# Patient Record
Sex: Female | Born: 1945
Health system: Southern US, Community
[De-identification: ages and names within clinical notes are randomized; demographics above are authoritative.]

## PROBLEM LIST (undated history)

## (undated) DIAGNOSIS — Z9289 Personal history of other medical treatment: Secondary | ICD-10-CM

## (undated) DIAGNOSIS — J8410 Pulmonary fibrosis, unspecified: Secondary | ICD-10-CM

## (undated) DIAGNOSIS — J849 Interstitial pulmonary disease, unspecified: Secondary | ICD-10-CM

## (undated) DIAGNOSIS — J302 Other seasonal allergic rhinitis: Secondary | ICD-10-CM

## (undated) DIAGNOSIS — I5030 Unspecified diastolic (congestive) heart failure: Secondary | ICD-10-CM

## (undated) DIAGNOSIS — C801 Malignant (primary) neoplasm, unspecified: Secondary | ICD-10-CM

## (undated) DIAGNOSIS — M349 Systemic sclerosis, unspecified: Secondary | ICD-10-CM

## (undated) DIAGNOSIS — M069 Rheumatoid arthritis, unspecified: Secondary | ICD-10-CM

## (undated) DIAGNOSIS — K219 Gastro-esophageal reflux disease without esophagitis: Secondary | ICD-10-CM

## (undated) DIAGNOSIS — K760 Fatty (change of) liver, not elsewhere classified: Secondary | ICD-10-CM

## (undated) DIAGNOSIS — IMO0001 Reserved for inherently not codable concepts without codable children: Secondary | ICD-10-CM

## (undated) DIAGNOSIS — Z9049 Acquired absence of other specified parts of digestive tract: Secondary | ICD-10-CM

## (undated) DIAGNOSIS — I452 Bifascicular block: Secondary | ICD-10-CM

## (undated) DIAGNOSIS — I10 Essential (primary) hypertension: Secondary | ICD-10-CM

## (undated) DIAGNOSIS — I7300 Raynaud's syndrome without gangrene: Secondary | ICD-10-CM

## (undated) DIAGNOSIS — M1990 Unspecified osteoarthritis, unspecified site: Secondary | ICD-10-CM

## (undated) HISTORY — PX: BLADDER SUSPENSION: SHX72

## (undated) HISTORY — DX: Systemic sclerosis, unspecified: M34.9

## (undated) HISTORY — DX: Unspecified osteoarthritis, unspecified site: M19.90

## (undated) HISTORY — DX: Interstitial pulmonary disease, unspecified: J84.9

## (undated) HISTORY — DX: Other seasonal allergic rhinitis: J30.2

## (undated) HISTORY — PX: LAPAROSCOPIC CHOLECYSTECTOMY: SUR755

## (undated) HISTORY — DX: Essential (primary) hypertension: I10

## (undated) HISTORY — DX: Raynaud's syndrome without gangrene: I73.00

## (undated) HISTORY — DX: Pulmonary fibrosis, unspecified: J84.10

## (undated) HISTORY — DX: Acquired absence of other specified parts of digestive tract: Z90.49

## (undated) HISTORY — DX: Unspecified diastolic (congestive) heart failure: I50.30

## (undated) HISTORY — DX: Rheumatoid arthritis, unspecified: M06.9

## (undated) HISTORY — PX: ABDOMINAL HYSTERECTOMY: SHX81

## (undated) HISTORY — PX: TONSILLECTOMY: SUR1361

---

## 1985-11-30 HISTORY — PX: VESICOVAGINAL FISTULA CLOSURE W/ TAH: SUR271

## 2002-10-17 ENCOUNTER — Ambulatory Visit (HOSPITAL_BASED_OUTPATIENT_CLINIC_OR_DEPARTMENT_OTHER): Admission: RE | Admit: 2002-10-17 | Discharge: 2002-10-17 | Payer: Self-pay | Admitting: Orthopedic Surgery

## 2002-12-04 ENCOUNTER — Other Ambulatory Visit: Admission: RE | Admit: 2002-12-04 | Discharge: 2002-12-04 | Payer: Self-pay | Admitting: *Deleted

## 2003-06-11 ENCOUNTER — Other Ambulatory Visit: Admission: RE | Admit: 2003-06-11 | Discharge: 2003-06-11 | Payer: Self-pay | Admitting: *Deleted

## 2003-09-16 ENCOUNTER — Emergency Department (HOSPITAL_COMMUNITY): Admission: EM | Admit: 2003-09-16 | Discharge: 2003-09-16 | Payer: Self-pay | Admitting: Emergency Medicine

## 2004-01-07 ENCOUNTER — Other Ambulatory Visit: Admission: RE | Admit: 2004-01-07 | Discharge: 2004-01-07 | Payer: Self-pay | Admitting: *Deleted

## 2005-05-20 ENCOUNTER — Ambulatory Visit (HOSPITAL_COMMUNITY): Admission: RE | Admit: 2005-05-20 | Discharge: 2005-05-20 | Payer: Self-pay | Admitting: Obstetrics and Gynecology

## 2005-11-30 HISTORY — PX: CARPAL TUNNEL RELEASE: SHX101

## 2006-06-09 ENCOUNTER — Ambulatory Visit (HOSPITAL_COMMUNITY): Admission: RE | Admit: 2006-06-09 | Discharge: 2006-06-09 | Payer: Self-pay | Admitting: Obstetrics and Gynecology

## 2006-06-29 ENCOUNTER — Ambulatory Visit (HOSPITAL_COMMUNITY): Admission: RE | Admit: 2006-06-29 | Discharge: 2006-06-29 | Payer: Self-pay

## 2007-04-22 ENCOUNTER — Ambulatory Visit (HOSPITAL_COMMUNITY): Admission: RE | Admit: 2007-04-22 | Discharge: 2007-04-22 | Payer: Self-pay | Admitting: Internal Medicine

## 2007-09-06 ENCOUNTER — Ambulatory Visit (HOSPITAL_COMMUNITY): Admission: RE | Admit: 2007-09-06 | Discharge: 2007-09-06 | Payer: Self-pay

## 2007-10-31 ENCOUNTER — Ambulatory Visit (HOSPITAL_COMMUNITY): Admission: RE | Admit: 2007-10-31 | Discharge: 2007-10-31 | Payer: Self-pay | Admitting: Obstetrics and Gynecology

## 2008-05-31 ENCOUNTER — Ambulatory Visit (HOSPITAL_COMMUNITY): Admission: RE | Admit: 2008-05-31 | Discharge: 2008-05-31 | Payer: Self-pay | Admitting: Internal Medicine

## 2008-06-19 ENCOUNTER — Ambulatory Visit (HOSPITAL_COMMUNITY): Admission: RE | Admit: 2008-06-19 | Discharge: 2008-06-19 | Payer: Self-pay | Admitting: Gastroenterology

## 2008-07-06 ENCOUNTER — Other Ambulatory Visit: Admission: RE | Admit: 2008-07-06 | Discharge: 2008-07-06 | Payer: Self-pay | Admitting: Obstetrics and Gynecology

## 2008-08-13 ENCOUNTER — Encounter (INDEPENDENT_AMBULATORY_CARE_PROVIDER_SITE_OTHER): Payer: Self-pay | Admitting: General Surgery

## 2008-08-13 ENCOUNTER — Ambulatory Visit (HOSPITAL_COMMUNITY): Admission: RE | Admit: 2008-08-13 | Discharge: 2008-08-13 | Payer: Self-pay | Admitting: General Surgery

## 2008-08-17 ENCOUNTER — Ambulatory Visit (HOSPITAL_COMMUNITY): Admission: RE | Admit: 2008-08-17 | Discharge: 2008-08-17 | Payer: Self-pay | Admitting: General Surgery

## 2008-08-18 ENCOUNTER — Emergency Department (HOSPITAL_COMMUNITY): Admission: EM | Admit: 2008-08-18 | Discharge: 2008-08-18 | Payer: Self-pay | Admitting: Emergency Medicine

## 2009-10-15 ENCOUNTER — Ambulatory Visit (HOSPITAL_COMMUNITY): Admission: RE | Admit: 2009-10-15 | Discharge: 2009-10-15 | Payer: Self-pay | Admitting: Internal Medicine

## 2009-10-23 ENCOUNTER — Ambulatory Visit (HOSPITAL_COMMUNITY): Admission: RE | Admit: 2009-10-23 | Discharge: 2009-10-23 | Payer: Self-pay | Admitting: Orthopaedic Surgery

## 2009-12-17 ENCOUNTER — Ambulatory Visit: Payer: Self-pay | Admitting: Internal Medicine

## 2009-12-18 ENCOUNTER — Encounter: Payer: Self-pay | Admitting: Internal Medicine

## 2010-02-18 ENCOUNTER — Ambulatory Visit: Payer: Self-pay | Admitting: Rheumatology

## 2010-09-19 ENCOUNTER — Other Ambulatory Visit: Payer: Self-pay | Admitting: Obstetrics and Gynecology

## 2010-10-15 ENCOUNTER — Ambulatory Visit: Payer: Self-pay | Admitting: Obstetrics and Gynecology

## 2010-12-30 NOTE — Miscellaneous (Signed)
Summary: Orders Update pft charges  Clinical Lists Changes  Orders: Added new Service order of Carbon Monoxide diffusing w/capacity [phone]) - Signed Added new Service order of Lung Volumes (41991) - Signed Added new Service order of Spirometry (Pre & Post) [phone]) - Signed

## 2011-02-27 ENCOUNTER — Ambulatory Visit: Payer: Self-pay | Admitting: Rheumatology

## 2011-04-14 NOTE — Op Note (Signed)
[name], [name]             ACCOUNT NO.:  [id number]   MEDICAL RECORD NO.:  [id number]          PATIENT TYPE:  AMB   LOCATION:  ENDO                         FACILITY:  [facility]   PHYSICIAN:  Ronald Lobo, M.D.   DATE OF BIRTH:  09-Jan-1946   DATE OF PROCEDURE:  06/19/2008  DATE OF DISCHARGE:                               OPERATIVE REPORT   PROCEDURE:  Upper endoscopy.   INDICATION:  Refractory GERD symptoms.   FINDINGS:  Essentially normal exam.   PROCEDURE:  The nature, purpose and risks of the procedure had been  discussed with the patient who provided written consent.  Sedation was  fentanyl 75 mcg and Versed 7.5 mg IV without arrhythmias or  desaturation.  The Pentax video endoscope was passed under direct  vision.  The esophagus was readily entered.   The esophagus was normal.  No reflux esophagitis or Barrett's esophagus  was seen.  I did not see any free reflux.  No hiatal hernia, ring or  stricture was present.  I did not see varices infection or neoplasia.  It should be noted the vocal cords looked normal during passage of the  scope.   The scope entered the stomach which contained no significant residual.  There was a little bit of patchy erythema in the proximal stomach but no  erosions, ulcers, polyps, masses or antral gastritis.  Retroflexed  viewing of the cardia was unremarkable as was the pylorus, the duodenal  bulb and second duodenum.  The scope was removed from the patient.  No  biopsies were obtained.  She tolerated the procedure well and there were  no apparent complications.   IMPRESSION:  Normal endoscopy, without evidence of adverse sequelae of  reflux disease.   PLAN:  Consider intensification of PPI therapy.           ______________________________  Ronald Lobo, M.D.     RB/MEDQ  D:  06/19/2008  T:  06/19/2008  Job:  [id number]   cc:   Paula Compton. Willey Blade, MD  Fax: [phone]   Michael Litter, M.D.  Fax: [phone]

## 2011-04-14 NOTE — Op Note (Signed)
[name], KLAS             ACCOUNT NO.:  [id number]   MEDICAL RECORD NO.:  [id number]          PATIENT TYPE:  AMB   LOCATION:  ENDO                         FACILITY:  [facility]   PHYSICIAN:  Ronald Lobo, M.D.   DATE OF BIRTH:  September 12, 1946   DATE OF PROCEDURE:  06/19/2008  DATE OF DISCHARGE:                               OPERATIVE REPORT   PROCEDURE:  Colonoscopy.   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   FINDINGS:  Normal exam to the terminal ileum.   DESCRIPTION OF PROCEDURE:  The nature, purpose and risks of the  procedure had been discussed with the patient who provided written  consent.  This procedure was done immediately following her upper  endoscopy.  Total sedation for the two procedures was fentanyl 100 mcg  and Versed 10 mg IV resulting in moderate sedation without arrhythmias  or desaturation.   The Pentax pediatric video colonoscope was advanced to the cecum using a  little bit of external abdominal compression to control looping.  The  terminal ileum was entered for short distance and appeared normal.  Pullback was then performed.  The quality of prep was excellent, and it  was felt that all areas were well seen.   This was a completely normal examination.  No polyps, cancer, colitis,  vascular malformations or diverticular disease were observed; and  retroflexion in the rectum was unremarkable apart from some  hypertrophied anal papillae.   No biopsies were obtained.  The patient tolerated the procedure well,  and there no apparent complications.   IMPRESSION:  Normal screening colonoscopy in a standard risk  individual.   PLAN:  Repeat colonoscopy for screening in 10 years or sooner if needed  because of symptoms or problems.           ______________________________  Ronald Lobo, M.D.     RB/MEDQ  D:  06/19/2008  T:  06/19/2008  Job:  [id number]   cc:   Paula Compton. Willey Blade, MD  Fax:  [phone]   Michael Litter, M.D.  Fax: [phone]

## 2011-04-14 NOTE — Op Note (Signed)
NAMECHELSYE, SUHRE             ACCOUNT NO.:  [id number]   MEDICAL RECORD NO.:  [id number]          PATIENT TYPE:  AMB   LOCATION:  SDS                          FACILITY:  [facility]   PHYSICIAN:  Gwenyth Ober, M.D.    DATE OF BIRTH:  [birth year]   DATE OF PROCEDURE:  08/13/2008  DATE OF DISCHARGE:  08/13/2008                               OPERATIVE REPORT   PREOPERATIVE DIAGNOSIS:  Symptomatic cholelithiasis.   POSTOPERATIVE DIAGNOSIS:  Symptomatic cholelithiasis with a nodule of  fatty liver.   PROCEDURE:  Laparoscopic cholecystectomy.   SURGEON:  Gwenyth Ober, MD   ANESTHESIA:  General endotracheal.   ASSISTANT:  Sharyn Dross, RNFA   ESTIMATED BLOOD LOSS:  Less than 20 mL.   FINDINGS:  A nodule of fatty liver with multiple small gallstones  measuring 1-2 mm in size up to 1 cm in size.   INDICATIONS FOR OPERATION:  The patient is a 65 year old with  symptomatic gallstones who comes in for laparoscopic cholecystectomy.   OPERATION:  The patient was taken to the operating room and placed on  table in supine position.  After an adequate general endotracheal  anesthetic was administered, she was prepped and draped in the usual  sterile manner in exposing midline and right upper quadrant.   A supraumbilical curvilinear incision was made using #11 blade and taken  down to the midline fascia.  We grabbed the fascia with Kocher clamps  and then incised between the Kocher clamps using a #15 blade into the  preperitoneal space.  We grabbed the edges of fascia with Kocher clamps  and dissected through the preperitoneal space into the peritoneal  cavity.  A purse-string suture of 0 Vicryl was passed around the fascial  opening and then a Hasson cannula was passed into the peritoneal cavity  and secured in place with a purse-string suture.  We insufflated carbon  dioxide gas up to a maximal intra-abdominal pressure of 50 mmHg, then  placed the patient in reverse Trendelenburg with  her left side tilted  down.   Two right costal margin or right-sided 5-mm cannulas and a subxiphoid  11/12 mm cannula passed under direct vision.  With all cannulas in  place, we were able to retract the gallbladder towards the right upper  quadrant.  We did take pictures of the nodule of fatty liver, but it did  not appear to be cirrhotic.   We were able to dissect out the peritoneum overlying the triangle of  Calot and hepatoduodenal triangle with a second retractor on the  infundibulum.  We were able to isolate the cystic duct and the cystic  artery very easily.  The cystic duct appeared to be normal size.  We  clipped along the distal side of the cystic duct x3 and proximal on the  gallbladder side and transected the duct.  Some debris of gallstones  came out of the transected portion of the cystic duct, which was small  into the infundibulum.   We clipped the cystic artery proximally and distally and dissected out  the gallbladder from its bed with minimal difficulty,  retrieving it from  this supraumbilical site with a large grasper.  We irrigated the bed for  hemostasis.  There appeared to be adequate hemostasis and cauterization  was necessary but minimal.   We used a purse-string suture of the supraumbilical site nor to close  off the supraumbilical fascia.  We irrigated the bed and aspirated all  fluid and gas from above the liver as we removed all cannulas.   We injected 0.25% Marcaine with epi at all sites.  We then closed the  supraumbilical and subxiphoid site using a running subcuticular stitch  of 4-0 Vicryl.  Again, 0.25% Marcaine was injected at all sites.  The  lateral cannula sites were closed with Dermabond and the other incisions  were closed and covered with Dermabond, Steri-Strips, and Tegaderm.  All  needle counts, sponge counts, and instrument counts were correct.      Gwenyth Ober, M.D.  Electronically Signed     JOW/MEDQ  D:  08/13/2008  T:   08/14/2008  Job:  [id number]   cc:   Paula Compton. Willey Blade, MD

## 2011-04-17 NOTE — Group Therapy Note (Signed)
NAMETRACEY, HERMANCE NO.:  [id number]   MEDICAL RECORD NO.:  [id number]                   PATIENT TYPE:  EMS   LOCATION:  ED                                   FACILITY:  [facility]   PHYSICIAN:  Estill Bamberg. Karie Kirks, M.D.           DATE OF BIRTH:  1946-04-16   DATE OF PROCEDURE:  09/16/2003  DATE OF DISCHARGE:                                   PROGRESS NOTE   SUBJECTIVE:  This is a 65 year old woman who had a flu shot at work at [employer] on Tuesday this week - five days ago.  The following day she  developed fever to 101 and diarrhea.  She had aches.  She has gradually  improved with her fever so she is afebrile now but still having profuse  watery diarrhea.   I talked to her on the phone yesterday and recommended she start on KCl 20  mEq t.i.d.  We also called her in some Lomotil to use one or two q.i.d.  since OTC medications were not working.  She actually feels a little bit  stronger since then but still weak such that when she stands up she has felt  a little bit weak.   She had a hysterectomy but ovaries in place.  She has had no other surgery.   OBJECTIVE:  She is supine in bed, oriented and alert, presently no acute  distress.  Temperature is 97 degrees, pulse 100, respiratory rate 16, blood  pressure 143/71, O2 saturation on room air 97%.  Her vital signs sitting  were BP 143/71, pulse 100; and then standing the blood pressure dropped to  124/79, pulse was 115.  Heart had a regular rhythm without murmur, rate of  70.  The lungs appear clear throughout.  The abdomen was soft without  demonstrable hepatosplenomegaly or mass but she did have tenderness in both  the right and left upper quadrant on deep palpation.  There is no edema of  the ankles.  Skin turgor was normal.   Her white cell count was 8000 of which 77% were neutrophils, 17%  lymphocytes.  H&H was 14.4/42.9, MCV 89.  Her MET-7 showed a potassium of  3.6, BUN 11,  creatinine 0.7.   ASSESSMENT:  1. Dehydration.  2. Probably viral enteritis.   PLAN:  She is on IV normal saline at 200 mL/hour presently.  LFTs are  pending.  If these are positive will admit and get abdominal ultrasound.  If  they are negative, arrange for her to be discharged on Levaquin 500 mg daily  and metronidazole 250 mg t.i.d. 10-day course after a stool culture is done.  Discussed at length with the patient and her husband.  She was seen at the  [facility] emergency room by me on a Sunday morning and I spent 25 minutes  reviewing her medical record including the paper record, discussion with  her,  exam, formulation of plan, etc.      ___________________________________________                                            Estill Bamberg. Karie Kirks, M.D.   SDK/MEDQ  D:  09/16/2003  T:  09/17/2003  Job:  [id number]

## 2011-04-17 NOTE — Op Note (Signed)
[name], SNARE                         ACCOUNT NO.:  [id number]   MEDICAL RECORD NO.:  [id number]                   PATIENT TYPE:  AMB   LOCATION:  [place]                                  FACILITY:  [facility]   PHYSICIAN:  Youlanda Mighty. Luisa Dago., M.D.          DATE OF BIRTH:  [birth year]   DATE OF PROCEDURE:  10/17/2002  DATE OF DISCHARGE:                                 OPERATIVE REPORT   PREOPERATIVE DIAGNOSIS:  Bilateral carpal tunnel syndrome.   POSTOPERATIVE DIAGNOSIS:  Bilateral carpal tunnel syndrome.   OPERATION PERFORMED:  1. Release of right transverse carpal ligament 64721-RT.  2. Injection of left ulnar bursa 20526-LT.   SURGEON:  Youlanda Mighty. Sypher, M.D.   ASSISTANT:  Julian Reil, P.A.   ANESTHESIA:  General by LMA.   SUPERVISING ANESTHESIOLOGIST:  Jessy Oto. Albertina Parr, M.D.   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  She had a lengthy history of  bilateral carpal tunnel syndrome dating back to before 2000.  She had had  previous electrodiagnostic studies completed by her neurologist in the year  2000 which documented carpal tunnel syndrome.  She has been treating her  carpal tunnel syndrome nonoperatively with splinting and activity  modification.   She has had persistent symptoms.  Therefore, repeat electrodiagnostic  studies were completed at our office by Dr. Zebedee Iba which documented  significant bilateral carpal tunnel syndrome with prolongation of the motor  and sensory latencies bilaterally as well as prolongation of the lumbrical  interosseous difference.   Due to failure to respond to nonoperative measures, she is brought to the  operating room at this time for release of her right transverse carpal  ligament and requested injection of her left ulnar bursa under anesthesia  with steroid and  lidocaine.  After informed consent she is brought to the  operating room at this time.   DESCRIPTION OF PROCEDURE:  The patient was brought to the operating room and  placed in supine position upon the operating table.  Following induction of  general anesthesia by LMA, the right arm was prepped with Betadine soap and  solution and sterilely draped.  Following exsanguination of the limb with an  Esmarch bandage, an arterial tourniquet applied to 240 mmHg and later  elevated to 260 mmHg due to mild systolic hypertension.   The procedure commenced with a short incision in line with the ring finger  in the palm.  The subcutaneous tissues were carefully divided in the palmar  fascia.  The patient was noted to have a rather prominent thenar muscle  extending to the distal margin of the transverse carpal ligament essentially  bridging to the hypothenar muscles.  This was carefully teased with scissors  to be certain the motor branch was not imperiled by release at this level.  The transverse carpal ligament distal margin was identified and subsequently  released with scissors into the distal forearm.  This widened opened the  carpal canal.  No masses or other predicaments were noted.  Bleeding points  along the margin of the released ligament were electrocauterized with  bipolar current followed by repair of the skin with intradermal 3-0 Prolene  suture.  The wound was then dressed with Xeroflo, sterile gauze and a volar  plaster splint maintaining the wrist in 5 degrees dorsiflexion.  An Ace wrap  was used to provide light compression.   Attention was then directed to the left wrist.  The skin was prepped with  alcohol followed by injection of a mixture of 0.5 cc of Depo-Medrol 40 mg  per mg and 1.5 cc of 1% lidocaine without epinephrine.  This was injected  into the ulnar bursa at the level of the proximal wrist flexion crease in  the line of the ring finger.  This wound was held with  compression for one  minute following injection.  There were no apparent complications.   The patient tolerated surgery and anesthesia well.  She was transferred to  the recovery room with stable vital signs.  For aftercare she was given a  prescription for Percocet 5 mg one or two tablets p.o. q.4-6h. p.r.n. pain.  Twenty tablets without refill.  She will return to our office in follow-up  in seven to 10 days for dressing change and advancement to an exercise  program.                                               Youlanda Mighty. Luisa Dago., M.D.    RVS/MEDQ  D:  10/17/2002  T:  10/17/2002  Job:  [id number]

## 2011-05-19 ENCOUNTER — Ambulatory Visit: Payer: Self-pay | Admitting: Podiatry

## 2011-08-31 LAB — COMPREHENSIVE METABOLIC PANEL
Alkaline Phosphatase: 75
BUN: 15
CO2: 24
Calcium: 9.6
Chloride: 105
Creatinine, Ser: 0.71
GFR calc non Af Amer: >60
Glucose, Bld: 106 — ABNORMAL HIGH
Potassium: 4.4
Sodium: 140
Total Bilirubin: 1.2

## 2011-08-31 LAB — DIFFERENTIAL
Basophils Relative: 1
Eosinophils Absolute: 0.2
Monocytes Relative: 7
Neutrophils Relative %: 69

## 2011-08-31 LAB — POCT I-STAT 3, ART BLOOD GAS (G3+)
Acid-Base Excess: 4 — ABNORMAL HIGH
Bicarbonate: 28.5 — ABNORMAL HIGH
O2 Saturation: 97
TCO2: 30
pCO2 arterial: 41.4
pH, Arterial: 7.446 — ABNORMAL HIGH
pO2, Arterial: 89

## 2011-08-31 LAB — LIPASE, BLOOD: Lipase: 31

## 2011-08-31 LAB — CBC
MCHC: 34.7
RDW: 12.7

## 2011-09-02 LAB — COMPREHENSIVE METABOLIC PANEL
AST: 39 — ABNORMAL HIGH
Alkaline Phosphatase: 75
BUN: 15
CO2: 27
Creatinine, Ser: 0.84
GFR calc Af Amer: >60
Glucose, Bld: 99
Potassium: 4
Total Bilirubin: 0.9
Total Protein: 7.2

## 2011-09-02 LAB — DIFFERENTIAL
Basophils Absolute: 0.1
Basophils Relative: 1

## 2011-09-02 LAB — CBC
HCT: 40.8
MCV: 91.9
RDW: 12.7
WBC: 8.4

## 2011-09-30 DIAGNOSIS — M170 Bilateral primary osteoarthritis of knee: Secondary | ICD-10-CM | POA: Insufficient documentation

## 2011-09-30 DIAGNOSIS — M1710 Unilateral primary osteoarthritis, unspecified knee: Secondary | ICD-10-CM | POA: Insufficient documentation

## 2011-09-30 DIAGNOSIS — M3481 Systemic sclerosis with lung involvement: Secondary | ICD-10-CM | POA: Insufficient documentation

## 2011-09-30 LAB — PULMONARY FUNCTION TEST

## 2012-05-13 ENCOUNTER — Ambulatory Visit (HOSPITAL_COMMUNITY)
Admission: RE | Admit: 2012-05-13 | Discharge: 2012-05-13 | Disposition: A | Discharge status: Home / Self Care | Payer: PRIVATE HEALTH INSURANCE | Source: Ambulatory Visit | Attending: Internal Medicine | Admitting: Internal Medicine

## 2012-05-13 ENCOUNTER — Other Ambulatory Visit (HOSPITAL_COMMUNITY): Payer: Self-pay | Admitting: Internal Medicine

## 2012-05-13 DIAGNOSIS — J479 Bronchiectasis, uncomplicated: Secondary | ICD-10-CM | POA: Insufficient documentation

## 2012-05-13 DIAGNOSIS — R05 Cough: Secondary | ICD-10-CM

## 2012-05-13 DIAGNOSIS — R059 Cough, unspecified: Secondary | ICD-10-CM | POA: Insufficient documentation

## 2012-05-13 DIAGNOSIS — R0602 Shortness of breath: Secondary | ICD-10-CM | POA: Insufficient documentation

## 2012-05-13 DIAGNOSIS — J8410 Pulmonary fibrosis, unspecified: Secondary | ICD-10-CM | POA: Insufficient documentation

## 2012-06-27 ENCOUNTER — Encounter: Payer: Self-pay | Admitting: Pulmonary Disease

## 2012-06-28 ENCOUNTER — Ambulatory Visit (INDEPENDENT_AMBULATORY_CARE_PROVIDER_SITE_OTHER): Payer: PRIVATE HEALTH INSURANCE | Admitting: Critical Care Medicine

## 2012-06-28 ENCOUNTER — Encounter: Payer: Self-pay | Admitting: Critical Care Medicine

## 2012-06-28 VITALS — BP 126/72 | HR 86 | Temp 98.2°F | Ht 63.0 in | Wt 199.8 lb

## 2012-06-28 DIAGNOSIS — J849 Interstitial pulmonary disease, unspecified: Secondary | ICD-10-CM

## 2012-06-28 DIAGNOSIS — M349 Systemic sclerosis, unspecified: Secondary | ICD-10-CM

## 2012-06-28 DIAGNOSIS — J302 Other seasonal allergic rhinitis: Secondary | ICD-10-CM

## 2012-06-28 DIAGNOSIS — M069 Rheumatoid arthritis, unspecified: Secondary | ICD-10-CM

## 2012-06-28 DIAGNOSIS — M1990 Unspecified osteoarthritis, unspecified site: Secondary | ICD-10-CM

## 2012-06-28 DIAGNOSIS — J309 Allergic rhinitis, unspecified: Secondary | ICD-10-CM

## 2012-06-28 DIAGNOSIS — I7300 Raynaud's syndrome without gangrene: Secondary | ICD-10-CM

## 2012-06-28 DIAGNOSIS — I10 Essential (primary) hypertension: Secondary | ICD-10-CM

## 2012-06-28 DIAGNOSIS — J8410 Pulmonary fibrosis, unspecified: Secondary | ICD-10-CM

## 2012-06-28 NOTE — Patient Instructions (Signed)
No change in medications. A CT of the chest will be obtained at [facility] An Echocardiogram will be obtained at [facility] Full PFTs and 61mn walk will be obtained at our office I will call with results [place]

## 2012-06-28 NOTE — Progress Notes (Signed)
Subjective:    Patient ID: [name], female    DOB: 27-Jan-1946, 66 y.o.   MRN: [id number]  Shortness of Breath This is a chronic problem. The current episode started more than 1 year ago. The problem occurs daily. The problem has been gradually worsening (exertion only.  Ok at rest.  no qhs issues). Associated symptoms include abdominal pain, leg swelling, rhinorrhea, a sore throat and sputum production. Pertinent negatives include no chest pain, claudication, coryza, ear pain, fever, headaches, leg pain, neck pain, orthopnea, PND, rash, swollen glands, vomiting or wheezing. The symptoms are aggravated by any activity, exercise and weather changes (worse in winter). Associated symptoms comments: Raynauds.  Sclerodactyly  No esophagus issues.  No dysphagia issues. Hx of chronic cough.  Occ productive but swallows  No PulmHTN. Several echo 1 year No edema in feet No PNA Hands, wrist ,knees. Dx Rheumatoid Arthritis. Risk factors include smoking. She has tried oral steroids (pred helped, MTX weekly inj helps) for the symptoms. The treatment provided moderate relief. Her past medical history is significant for chronic lung disease. There is no history of allergies, aspirin allergies, asthma, bronchiolitis, CAD, COPD, DVT, a heart failure, PE, pneumonia or a recent surgery.   66 y.o. F Dx Scleroderma Dx 28yr ago Dr RJustine Null  [facility]  Saw MRandol Kernas well 10/12.   Pt has had several CTs and PFTs.      Past Medical History  Diagnosis Date  . Systemic sclerosis   . Osteoarthritis   . Seasonal allergies   . Interstitial lung disease   . H/O: hysterectomy   . H/O bladder repair surgery   . Hx of cholecystectomy   . Rheumatoid arthritis   . Raynaud disease   . Hypertension   . Pulmonary fibrosis      Family History  Problem Relation Age of Onset  . Stroke Mother   . Heart attack Father   . Dementia Brother   . Osteoarthritis    . Celiac disease    . Emphysema  Father   . Heart disease Mother      History   Social History  . Marital Status: Married    Spouse Name: N/A    Number of Children: 3  . Years of Education: N/A   Occupational History  . Nurse   .     Social History Main Topics  . Smoking status: Former Smoker -- 1.0 packs/day for 20 years    Types: Cigarettes    Quit date: 11/30/1988  . Smokeless tobacco: Never Used  . Alcohol Use: No  . Drug Use: No  . Sexually Active: Not on file   Other Topics Concern  . Not on file   Social History Narrative  . No narrative on file     Allergies  Allergen Reactions  . Codeine     Neurological problems - hallucinations, "spacey"     Outpatient Prescriptions Prior to Visit  Medication Sig Dispense Refill  . aspirin 81 MG tablet Take 81 mg by mouth daily.      .Marland Kitchenazelastine (ASTELIN) 137 MCG/SPRAY nasal spray Place 2 sprays into the nose 2 (two) times daily as needed. Use in each nostril as directed       . Biotin 5 MG CAPS Take 1 capsule by mouth daily.      . clopidogrel (PLAVIX) 300 MG TABS Take 200 mg by mouth once.      .Marland Kitchenestradiol (ESTRACE) 0.5 MG tablet  Take 0.5 mg by mouth daily.      . fexofenadine-pseudoephedrine (ALLEGRA-D) 60-120 MG per tablet Take 1 tablet by mouth as needed.      . zolpidem (AMBIEN) 5 MG tablet Take 12.5 mg by mouth as needed.       Marland Kitchen NIFEdipine (PROCARDIA-XL/ADALAT CC) 30 MG 24 hr tablet Take 30 mg by mouth daily.      Marland Kitchen estrogens, conjugated, (PREMARIN) 0.3 MG tablet Take 0.3 mg by mouth daily. Take daily for 21 days then do not take for 7 days.       . Flaxseed, Linseed, (FLAXSEED OIL PO) Take by mouth daily.      Marland Kitchen leflunomide (ARAVA) 20 MG tablet Take 20 mg by mouth daily.      . Multiple Vitamin (MULTIVITAMIN) tablet Take 1 tablet by mouth daily.          Review of Systems  Constitutional: Positive for chills, diaphoresis, fatigue and unexpected weight change. Negative for fever, activity change and appetite change.  HENT: Positive for  congestion, sore throat, rhinorrhea, sneezing and postnasal drip. Negative for hearing loss, ear pain, nosebleeds, facial swelling, mouth sores, trouble swallowing, neck pain, neck stiffness, dental problem, voice change, sinus pressure, tinnitus and ear discharge.   Eyes: Negative for photophobia, discharge, itching and visual disturbance.  Respiratory: Positive for cough, sputum production and shortness of breath. Negative for apnea, choking, chest tightness, wheezing and stridor.   Cardiovascular: Positive for palpitations and leg swelling. Negative for chest pain, orthopnea, claudication and PND.  Gastrointestinal: Positive for abdominal pain and abdominal distention. Negative for nausea, vomiting, constipation and blood in stool.  Genitourinary: Positive for urgency. Negative for dysuria, frequency, hematuria, flank pain, decreased urine volume and difficulty urinating.  Musculoskeletal: Positive for myalgias, back pain, joint swelling and arthralgias. Negative for gait problem.  Skin: Negative for color change, pallor and rash.  Neurological: Negative for dizziness, tremors, seizures, syncope, speech difficulty, weakness, light-headedness, numbness and headaches.  Hematological: Negative for adenopathy. Bruises/bleeds easily.  Psychiatric/Behavioral: Negative for confusion, disturbed wake/sleep cycle and agitation. The patient is not nervous/anxious.        Objective:   Physical Exam Filed Vitals:   06/28/12 1616  BP: 126/72  Pulse: 86  Temp: 98.2 F (36.8 C)  TempSrc: Oral  Height: _0  (1.6 m)  Weight: 90.629 kg (199 lb 12.8 oz)  SpO2: 97%    Gen: Pleasant, obese , in no distress,  normal affect  Classic perioral sclerodermal facies  ENT: No lesions,  mouth clear,  oropharynx clear, no postnasal drip  Neck: No JVD, no TMG, no carotid bruits  Lungs: No use of accessory muscles, no dullness to percussion,dry rales at bases  Cardiovascular: RRR, heart sounds normal, no  murmur or gallops, no peripheral edema  Abdomen: soft and NT, no HSM,  BS normal  Musculoskeletal: No deformities, no cyanosis or clubbing.  Mild sclerodactyly  Neuro: alert, non focal  Skin: Warm, telangiectasias  10/12:  Findings c/w pulm fibrosis.  UIP pattern.  Periphery distribution.  Scattered GG opacities Mediastinal LAN stable.  Fatty liver  DLCO 50% 10/12    Echo 2011  No pulm HTN  Nl LVEF       Assessment & Plan:   Pulmonary fibrosis Pulm fibrosis presumably d/t Scleroderma No esophageal involvement. + raynaud, sclerodactyly, telangiectasias,  pulm fibrosis.  No evident Pulm HTN at last check in 2010.  Last CT chest and PFT 10/12 at [facility]  DLCO 50% 10/12 Rheum: Dr Alanda Amass  at [facility] Currently pt at [place].  No overt oxygen needs Plan Need old records from [place] updated echo, CT Chest HRCT, Full PFTs with 6MW, ONO on RA  Return with updated infor to regroup No change in meds for now   Updated Medication List Outpatient Encounter Prescriptions as of 06/28/2012  Medication Sig Dispense Refill  . albuterol (PROVENTIL HFA;VENTOLIN HFA) 108 (90 BASE) MCG/ACT inhaler Inhale 2 puffs into the lungs every 6 (six) hours as needed.      Marland Kitchen aspirin 81 MG tablet Take 81 mg by mouth daily.      Marland Kitchen azelastine (ASTELIN) 137 MCG/SPRAY nasal spray Place 2 sprays into the nose 2 (two) times daily as needed. Use in each nostril as directed       . Biotin 5 MG CAPS Take 1 capsule by mouth daily.      . clopidogrel (PLAVIX) 300 MG TABS Take 200 mg by mouth once.      Marland Kitchen estradiol (ESTRACE) 0.5 MG tablet Take 0.5 mg by mouth daily.      . fexofenadine-pseudoephedrine (ALLEGRA-D) 60-120 MG per tablet Take 1 tablet by mouth as needed.      Marland Kitchen FOLIC ACID PO Take 1 tablet by mouth daily.      . hydroxychloroquine (PLAQUENIL) 200 MG tablet Take 400 mg by mouth daily.      . methotrexate 25 MG/ML SOLN once. 0.6 once weekly      . pantoprazole (PROTONIX) 20 MG tablet Take 20 mg by  mouth 2 (two) times daily.      . predniSONE (DELTASONE) 10 MG tablet Take 15 mg by mouth daily. As directed      . zolpidem (AMBIEN) 5 MG tablet Take 12.5 mg by mouth as needed.       Marland Kitchen DISCONTD: NIFEdipine (PROCARDIA-XL/ADALAT CC) 30 MG 24 hr tablet Take 30 mg by mouth daily.      Marland Kitchen DISCONTD: estrogens, conjugated, (PREMARIN) 0.3 MG tablet Take 0.3 mg by mouth daily. Take daily for 21 days then do not take for 7 days.       Marland Kitchen DISCONTD: Flaxseed, Linseed, (FLAXSEED OIL PO) Take by mouth daily.      Marland Kitchen DISCONTD: leflunomide (ARAVA) 20 MG tablet Take 20 mg by mouth daily.      Marland Kitchen DISCONTD: Multiple Vitamin (MULTIVITAMIN) tablet Take 1 tablet by mouth daily.

## 2012-06-29 NOTE — Assessment & Plan Note (Signed)
Pulm fibrosis presumably d/t Scleroderma No esophageal involvement. + raynaud, sclerodactyly, telangiectasias,  pulm fibrosis.  No evident Pulm HTN at last check in 2010.  Last CT chest and PFT 10/12 at [facility] 50% 10/12 Rheum: Dr Alanda Amass at [facility] Currently pt at [facility].  No overt oxygen needs Plan Need old records from [place] updated echo, CT Chest HRCT, Full PFTs with 6MW, ONO on RA  Return with updated infor to regroup No change in meds for now

## 2012-07-04 ENCOUNTER — Ambulatory Visit (HOSPITAL_COMMUNITY)
Admission: RE | Admit: 2012-07-04 | Discharge: 2012-07-04 | Disposition: A | Discharge status: Home / Self Care | Payer: PRIVATE HEALTH INSURANCE | Source: Ambulatory Visit | Attending: Critical Care Medicine | Admitting: Critical Care Medicine

## 2012-07-04 DIAGNOSIS — I517 Cardiomegaly: Secondary | ICD-10-CM

## 2012-07-04 DIAGNOSIS — J8410 Pulmonary fibrosis, unspecified: Secondary | ICD-10-CM | POA: Insufficient documentation

## 2012-07-04 DIAGNOSIS — M349 Systemic sclerosis, unspecified: Secondary | ICD-10-CM

## 2012-07-04 DIAGNOSIS — I270 Primary pulmonary hypertension: Secondary | ICD-10-CM | POA: Insufficient documentation

## 2012-07-04 NOTE — Progress Notes (Signed)
*  PRELIMINARY RESULTS* Echocardiogram 2D Echocardiogram has been performed.  [name] 07/04/2012, 11:40 AM

## 2012-07-05 ENCOUNTER — Ambulatory Visit (INDEPENDENT_AMBULATORY_CARE_PROVIDER_SITE_OTHER): Payer: PRIVATE HEALTH INSURANCE | Admitting: Internal Medicine

## 2012-07-05 DIAGNOSIS — R0609 Other forms of dyspnea: Secondary | ICD-10-CM

## 2012-07-05 DIAGNOSIS — J8410 Pulmonary fibrosis, unspecified: Secondary | ICD-10-CM

## 2012-07-05 DIAGNOSIS — J849 Interstitial pulmonary disease, unspecified: Secondary | ICD-10-CM

## 2012-07-05 DIAGNOSIS — M349 Systemic sclerosis, unspecified: Secondary | ICD-10-CM

## 2012-07-05 DIAGNOSIS — R0600 Dyspnea, unspecified: Secondary | ICD-10-CM

## 2012-07-05 LAB — PULMONARY FUNCTION TEST

## 2012-07-05 NOTE — Progress Notes (Signed)
PFT done today.

## 2012-07-06 ENCOUNTER — Telehealth: Payer: Self-pay | Admitting: Internal Medicine

## 2012-07-06 NOTE — Telephone Encounter (Signed)
[facility] for review on 07-06-12 ym

## 2012-07-07 ENCOUNTER — Encounter: Payer: Self-pay | Admitting: Critical Care Medicine

## 2012-07-07 ENCOUNTER — Telehealth: Payer: Self-pay | Admitting: Critical Care Medicine

## 2012-07-07 NOTE — Telephone Encounter (Signed)
I atc pt's home # x [phone] - [phone] - received fast busy signal.  wcb

## 2012-07-07 NOTE — Telephone Encounter (Signed)
Call pt and tell her pfts stable from [place] I have a call in to her Rheum MD

## 2012-07-08 NOTE — Telephone Encounter (Signed)
Called, spoke with pt.  I informed her pfts are stable from [place] and advised Dr. Joya Gaskins is waiting for Rheum MD to return his call.  She verbalized understanding of this.

## 2012-07-10 NOTE — Progress Notes (Signed)
Documentation for 6 minute walk test 

## 2012-07-11 ENCOUNTER — Telehealth: Payer: Self-pay | Admitting: Critical Care Medicine

## 2012-07-11 ENCOUNTER — Telehealth: Payer: Self-pay | Admitting: *Deleted

## 2012-07-11 NOTE — Telephone Encounter (Signed)
I spoke to the patient and told her about the changes in medications we would be making in conjuntion with Rheum MD at [facility] verbalized understanding that the methotrexate would be changed to a new med

## 2012-07-11 NOTE — Telephone Encounter (Signed)
Per Dr. Joya Gaskins, ONO on RA WNL.  Please call pt to inform her of this.  ---  Called, spoke with pt.  I informed her ONO on RA WNL per Joya Gaskins.  She verbalized understanding of this and voiced no further questions/concerns at this time.

## 2012-07-19 ENCOUNTER — Encounter: Payer: Self-pay | Admitting: Critical Care Medicine

## 2012-07-19 NOTE — Progress Notes (Signed)
Quick Note:  Dr. Joya Gaskins has spoke with Dr. Alanda Amass and pt. ______

## 2012-09-01 ENCOUNTER — Telehealth: Payer: Self-pay | Admitting: Critical Care Medicine

## 2012-09-01 NOTE — Telephone Encounter (Signed)
Called pt and left message x3 to make a follow up apt. No return call back. Sent letter 09/01/12 for pt to call and schd follow up.

## 2012-09-13 ENCOUNTER — Encounter: Payer: Self-pay | Admitting: Critical Care Medicine

## 2012-09-13 ENCOUNTER — Ambulatory Visit (INDEPENDENT_AMBULATORY_CARE_PROVIDER_SITE_OTHER): Payer: PRIVATE HEALTH INSURANCE | Admitting: Critical Care Medicine

## 2012-09-13 VITALS — BP 130/70 | HR 79 | Temp 98.2°F | Ht 63.0 in | Wt 209.2 lb

## 2012-09-13 DIAGNOSIS — J8410 Pulmonary fibrosis, unspecified: Secondary | ICD-10-CM

## 2012-09-13 NOTE — Progress Notes (Signed)
Subjective:    Patient ID: [name], female    DOB: [birth year], 66 y.o.   MRN: [id number]  HPI 66 y.o. F Dx Scleroderma Dx 72yr ago Dr RJustine Null  [facility]  Saw MRandol Kernas well 10/12.   Pt has had several CTs and PFTs.    09/13/2012  Here to regroup with scleroderma issues. The patient is now [facility] The patient notes joint symptoms are better on Humira. The patient denies any change in level of dyspnea. We reviewed with the patient all of the recent testing performed. There is no evidence of pulmonary hypertension. Pulmonary physiology and chest CT scan did not show significant fibrotic change or progression. Methotrexate was considered a risk for this patient due to pulmonary fibrosis and now has been discontinued. There are no new respiratory complaints.  Past Medical History  Diagnosis Date  . Systemic sclerosis   . Osteoarthritis   . Seasonal allergies   . Interstitial lung disease   . H/O: hysterectomy   . H/O bladder repair surgery   . Hx of cholecystectomy   . Rheumatoid arthritis   . Raynaud disease   . Hypertension   . Pulmonary fibrosis      Family History  Problem Relation Age of Onset  . Stroke Mother   . Heart attack Father   . Dementia Brother   . Osteoarthritis    . Celiac disease    . Emphysema Father   . Heart disease Mother      History   Social History  . Marital Status: Married    Spouse Name: N/A    Number of Children: 3  . Years of Education: N/A   Occupational History  . Nurse   .     Social History Main Topics  . Smoking status: Former Smoker -- 1.0 packs/day for 20 years    Types: Cigarettes    Quit date: 11/30/1988  . Smokeless tobacco: Never Used  . Alcohol Use: No  . Drug Use: No  . Sexually Active: Not on file   Other Topics Concern  . Not on file   Social History Narrative  . No narrative on file     Allergies  Allergen Reactions  . Codeine     Neurological problems -  hallucinations, "spacey"     Outpatient Prescriptions Prior to Visit  Medication Sig Dispense Refill  . albuterol (PROVENTIL HFA;VENTOLIN HFA) 108 (90 BASE) MCG/ACT inhaler Inhale 2 puffs into the lungs every 6 (six) hours as needed.      .Marland Kitchenaspirin 81 MG tablet Take 81 mg by mouth daily.      .Marland Kitchenazelastine (ASTELIN) 137 MCG/SPRAY nasal spray Place 2 sprays into the nose 2 (two) times daily as needed. Use in each nostril as directed       . Biotin 5 MG CAPS Take 1 capsule by mouth daily.      . clopidogrel (PLAVIX) 300 MG TABS Take 300 mg by mouth daily.       .Marland Kitchenestradiol (ESTRACE) 0.5 MG tablet Take 0.5 mg by mouth daily.      . fexofenadine-pseudoephedrine (ALLEGRA-D) 60-120 MG per tablet Take 1 tablet by mouth as needed.      .Marland KitchenFOLIC ACID PO Take 1 tablet by mouth daily.      . hydroxychloroquine (PLAQUENIL) 200 MG tablet Take 400 mg by mouth daily.      . pantoprazole (PROTONIX) 20 MG tablet  Take 20 mg by mouth 2 (two) times daily.      . predniSONE (DELTASONE) 10 MG tablet Take 5 mg by mouth daily. As directed      . zolpidem (AMBIEN) 5 MG tablet Take 12.5 mg by mouth as needed.       . methotrexate 25 MG/ML SOLN once. 0.6 once weekly          Review of Systems  Constitutional: Positive for fatigue and unexpected weight change. Negative for chills, diaphoresis, activity change and appetite change.  HENT: Positive for postnasal drip. Negative for hearing loss, nosebleeds, congestion, facial swelling, sneezing, mouth sores, trouble swallowing, neck stiffness, dental problem, voice change, sinus pressure, tinnitus and ear discharge.   Eyes: Negative for photophobia, discharge, itching and visual disturbance.  Respiratory: Negative for apnea, cough, choking, chest tightness and stridor.   Cardiovascular: Positive for palpitations.  Gastrointestinal: Positive for abdominal distention. Negative for nausea, constipation and blood in stool.  Genitourinary: Positive for urgency. Negative for  dysuria, frequency, hematuria, flank pain, decreased urine volume and difficulty urinating.  Musculoskeletal: Positive for myalgias, back pain, joint swelling and arthralgias. Negative for gait problem.  Skin: Negative for color change and pallor.  Neurological: Negative for dizziness, tremors, seizures, syncope, speech difficulty, weakness, light-headedness and numbness.  Hematological: Negative for adenopathy. Bruises/bleeds easily.  Psychiatric/Behavioral: Negative for confusion, disturbed wake/sleep cycle and agitation. The patient is not nervous/anxious.        Objective:   Physical Exam  Filed Vitals:   09/13/12 1113  BP: 130/70  Pulse: 79  Temp: 98.2 F (36.8 C)  TempSrc: Oral  Height: _0  (1.6 m)  Weight: 209 lb 3.2 oz (94.892 kg)  SpO2: 93%    Gen: Pleasant, obese , in no distress,  normal affect  Classic perioral sclerodermal facies  ENT: No lesions,  mouth clear,  oropharynx clear, no postnasal drip  Neck: No JVD, no TMG, no carotid bruits  Lungs: No use of accessory muscles, no dullness to percussion,dry rales at bases  Cardiovascular: RRR, heart sounds normal, no murmur or gallops, no peripheral edema  Abdomen: soft and NT, no HSM,  BS normal  Musculoskeletal: No deformities, no cyanosis or clubbing.  Mild sclerodactyly  Neuro: alert, non focal  Skin: Warm, telangiectasias       Assessment & Plan:   Pulmonary fibrosis Pulm fibrosis presumably d/t Scleroderma.  The patient also has an overlap syndrome with rheumatoid arthritis.  Note pulmonary function and CT scanning the chest shows no significant progression compared to 2012 data. Concern regarding methotrexate aggravating pulmonary fibrosis was raised with rheumatology at [facility] therefore methotrexate now has been discontinued and the patient is now on Humira for her arthritis symptom complex.  No esophageal involvement. + raynaud, sclerodactyly, telangiectasias,  pulm fibrosis.  No evident Pulm HTN  at last check in 2010.  Last CT chest and PFT 10/12 at [facility]  DLCO 50% 10/12 PFT 07/07/2012:  TLC 60%  DLCO 52%   CT Chest 07/07/2012: mild progression of pulmonary fibrosis compared to prior films Echo 07/07/2012  No pulm HTN Note overnight sleep oximetry is normal and no evidence of oxygen therapy needed Plan Continue marrow per rheumatology Consider discontinuing folic acid since the patient's now off methotrexate Continue Plaquenil Oxygenation at night and during the day is adequate therefore no oxygen is needed No additional pulmonary recommendations at this time     Updated Medication List Outpatient Encounter Prescriptions as of 09/13/2012  Medication Sig Dispense Refill  .  Adalimumab (HUMIRA PEN [place]) Inject 1 each into the skin every 14 (fourteen) days.      Marland Kitchen albuterol (PROVENTIL HFA;VENTOLIN HFA) 108 (90 BASE) MCG/ACT inhaler Inhale 2 puffs into the lungs every 6 (six) hours as needed.      Marland Kitchen aspirin 81 MG tablet Take 81 mg by mouth daily.      Marland Kitchen azelastine (ASTELIN) 137 MCG/SPRAY nasal spray Place 2 sprays into the nose 2 (two) times daily as needed. Use in each nostril as directed       . Biotin 5 MG CAPS Take 1 capsule by mouth daily.      . clopidogrel (PLAVIX) 300 MG TABS Take 300 mg by mouth daily.       Marland Kitchen estradiol (ESTRACE) 0.5 MG tablet Take 0.5 mg by mouth daily.      . fexofenadine-pseudoephedrine (ALLEGRA-D) 60-120 MG per tablet Take 1 tablet by mouth as needed.      Marland Kitchen FOLIC ACID PO Take 1 tablet by mouth daily.      . hydroxychloroquine (PLAQUENIL) 200 MG tablet Take 400 mg by mouth daily.      . pantoprazole (PROTONIX) 20 MG tablet Take 20 mg by mouth 2 (two) times daily.      . predniSONE (DELTASONE) 10 MG tablet Take 5 mg by mouth daily. As directed      . zolpidem (AMBIEN) 5 MG tablet Take 12.5 mg by mouth as needed.       Marland Kitchen DISCONTD: methotrexate 25 MG/ML SOLN once. 0.6 once weekly

## 2012-09-13 NOTE — Patient Instructions (Addendum)
No change in medications. Return in         4 months

## 2012-09-14 NOTE — Assessment & Plan Note (Addendum)
Pulm fibrosis presumably d/t Scleroderma.  The patient also has an overlap syndrome with rheumatoid arthritis.  Note pulmonary function and CT scanning the chest shows no significant progression compared to 2012 data. Concern regarding methotrexate aggravating pulmonary fibrosis was raised with rheumatology at [facility] therefore methotrexate now has been discontinued and the patient is now on Humira for her arthritis symptom complex.  No esophageal involvement. + raynaud, sclerodactyly, telangiectasias,  pulm fibrosis.  No evident Pulm HTN at last check in 2010.  Last CT chest and PFT 10/12 at [facility]  DLCO 50% 10/12 PFT 07/07/2012:  TLC 60%  DLCO 52%   CT Chest 07/07/2012: mild progression of pulmonary fibrosis compared to prior films Echo 07/07/2012  No pulm HTN Note overnight sleep oximetry is normal and no evidence of oxygen therapy needed Plan Continue marrow per rheumatology Consider discontinuing folic acid since the patient's now off methotrexate Continue Plaquenil Oxygenation at night and during the day is adequate therefore no oxygen is needed No additional pulmonary recommendations at this time

## 2012-10-20 ENCOUNTER — Other Ambulatory Visit (HOSPITAL_COMMUNITY): Payer: Self-pay | Admitting: Internal Medicine

## 2012-10-20 DIAGNOSIS — M7120 Synovial cyst of popliteal space [Baker], unspecified knee: Secondary | ICD-10-CM

## 2012-10-21 ENCOUNTER — Ambulatory Visit (HOSPITAL_COMMUNITY)
Admission: RE | Admit: 2012-10-21 | Discharge: 2012-10-21 | Disposition: A | Discharge status: Home / Self Care | Payer: PRIVATE HEALTH INSURANCE | Source: Ambulatory Visit | Attending: Internal Medicine | Admitting: Internal Medicine

## 2012-10-21 DIAGNOSIS — M7120 Synovial cyst of popliteal space [Baker], unspecified knee: Secondary | ICD-10-CM | POA: Insufficient documentation

## 2013-02-08 ENCOUNTER — Encounter: Payer: Self-pay | Admitting: Critical Care Medicine

## 2013-02-08 ENCOUNTER — Ambulatory Visit (INDEPENDENT_AMBULATORY_CARE_PROVIDER_SITE_OTHER): Payer: 59 | Admitting: Critical Care Medicine

## 2013-02-08 VITALS — BP 118/66 | HR 99 | Temp 97.6°F | Ht 63.0 in | Wt 194.4 lb

## 2013-02-08 DIAGNOSIS — M349 Systemic sclerosis, unspecified: Secondary | ICD-10-CM

## 2013-02-08 DIAGNOSIS — J8410 Pulmonary fibrosis, unspecified: Secondary | ICD-10-CM

## 2013-02-08 NOTE — Progress Notes (Signed)
Subjective:    Patient ID: [name], female    DOB: June 02, 1946, 67 y.o.   MRN: [id number]  HPI  67 y.o. F Dx Scleroderma Dx 70yr ago Dr RJustine Null  [facility]  Saw MRandol Kernas well 10/12.   Pt has had several CTs and PFTs.    09/13/2012  Here to regroup with scleroderma issues. The patient is now on Humira [facility] The patient notes joint symptoms are better on Humira. The patient denies any change in level of dyspnea. We reviewed with the patient all of the recent testing performed. There is no evidence of pulmonary hypertension. Pulmonary physiology and chest CT scan did not show significant fibrotic change or progression. Methotrexate was considered a risk for this patient due to pulmonary fibrosis and now has been discontinued. There are no new respiratory complaints.  02/08/2013 No changes since last ov.  Occ dry cough with change in weather.  Dyspnea the same from 08/2012 Pt did see K[facility]landa Amassat [facility]:  Flare was occuring: pred dose pak. Pred stopped with humira. Pt denies any significant sore throat, nasal congestion or excess secretions, fever, chills, sweats, unintended weight loss, pleurtic or exertional chest pain, orthopnea PND, or leg swelling Pt denies any increase in rescue therapy over baseline, denies waking up needing it or having any early am or nocturnal exacerbations of coughing/wheezing/or dyspnea. Pt also denies any obvious fluctuation in symptoms with  weather or environmental change or other alleviating or aggravating factors     Past Medical History  Diagnosis Date  . Systemic sclerosis   . Osteoarthritis   . Seasonal allergies   . Interstitial lung disease   . H/O: hysterectomy   . H/O bladder repair surgery   . Hx of cholecystectomy   . Rheumatoid arthritis   . Raynaud disease   . Hypertension   . Pulmonary fibrosis      Family History  Problem Relation Age of Onset  . Stroke Mother   . Heart attack Father   . Dementia  Brother   . Osteoarthritis    . Celiac disease    . Emphysema Father   . Heart disease Mother      History   Social History  . Marital Status: Married    Spouse Name: N/A    Number of Children: 3  . Years of Education: N/A   Occupational History  . Nurse   .     Social History Main Topics  . Smoking status: Former Smoker -- 1.00 packs/day for 20 years    Types: Cigarettes    Quit date: 11/30/1988  . Smokeless tobacco: Never Used  . Alcohol Use: No  . Drug Use: No  . Sexually Active: Not on file   Other Topics Concern  . Not on file   Social History Narrative  . No narrative on file     Allergies  Allergen Reactions  . Codeine     Neurological problems - hallucinations, "spacey"     Outpatient Prescriptions Prior to Visit  Medication Sig Dispense Refill  . Adalimumab (HUMIRA PEN [place]) Inject 1 each into the skin every 14 (fourteen) days.      .Marland Kitchenalbuterol (PROVENTIL HFA;VENTOLIN HFA) 108 (90 BASE) MCG/ACT inhaler Inhale 2 puffs into the lungs every 6 (six) hours as needed.      .Marland Kitchenaspirin 81 MG tablet Take 81 mg by mouth daily.      .Marland Kitchenazelastine (ASTELIN) 137 MCG/SPRAY  nasal spray Place 2 sprays into the nose 2 (two) times daily as needed. Use in each nostril as directed       . Biotin 5 MG CAPS Take 1 capsule by mouth daily.      . clopidogrel (PLAVIX) 300 MG TABS Take 300 mg by mouth daily.       Marland Kitchen estradiol (ESTRACE) 0.5 MG tablet Take 0.5 mg by mouth daily.      . fexofenadine-pseudoephedrine (ALLEGRA-D) 60-120 MG per tablet Take 1 tablet by mouth as needed.      . hydroxychloroquine (PLAQUENIL) 200 MG tablet Take 400 mg by mouth daily.      . pantoprazole (PROTONIX) 20 MG tablet Take 20 mg by mouth 2 (two) times daily.      Marland Kitchen zolpidem (AMBIEN) 5 MG tablet Take 12.5 mg by mouth as needed.       Marland Kitchen FOLIC ACID PO Take 1 tablet by mouth daily.      . predniSONE (DELTASONE) 10 MG tablet Take 5 mg by mouth daily. As directed       No facility-administered medications  prior to visit.      Review of Systems  Constitutional: Positive for fatigue and unexpected weight change. Negative for chills, diaphoresis, activity change and appetite change.  HENT: Positive for postnasal drip. Negative for hearing loss, nosebleeds, congestion, facial swelling, sneezing, mouth sores, trouble swallowing, neck stiffness, dental problem, voice change, sinus pressure, tinnitus and ear discharge.   Eyes: Negative for photophobia, discharge, itching and visual disturbance.  Respiratory: Negative for apnea, cough, choking, chest tightness and stridor.   Cardiovascular: Positive for palpitations.  Gastrointestinal: Positive for abdominal distention. Negative for nausea, constipation and blood in stool.  Genitourinary: Positive for urgency. Negative for dysuria, frequency, hematuria, flank pain, decreased urine volume and difficulty urinating.  Musculoskeletal: Positive for myalgias, back pain, joint swelling and arthralgias. Negative for gait problem.  Skin: Negative for color change and pallor.  Neurological: Negative for dizziness, tremors, seizures, syncope, speech difficulty, weakness, light-headedness and numbness.  Hematological: Negative for adenopathy. Bruises/bleeds easily.  Psychiatric/Behavioral: Negative for confusion, sleep disturbance and agitation. The patient is not nervous/anxious.        Objective:   Physical Exam  Filed Vitals:   02/08/13 1139  BP: 118/66  Pulse: 99  Temp: 97.6 F (36.4 C)  TempSrc: Oral  Height: 5' 3" (1.6 m)  Weight: 194 lb 6.4 oz (88.179 kg)  SpO2: 96%    Gen: Pleasant, obese , in no distress,  normal affect  Classic perioral sclerodermal facies  ENT: No lesions,  mouth clear,  oropharynx clear, no postnasal drip  Neck: No JVD, no TMG, no carotid bruits  Lungs: No use of accessory muscles, no dullness to percussion,dry rales at bases  Cardiovascular: RRR, heart sounds normal, no murmur or gallops, no peripheral  edema  Abdomen: soft and NT, no HSM,  BS normal  Musculoskeletal: No deformities, no cyanosis or clubbing.  Mild sclerodactyly  Neuro: alert, non focal  Skin: Warm, telangiectasias       Assessment & Plan:   Pulmonary fibrosis Pulm fibrosis presumably d/t Scleroderma No esophageal involvement. + raynaud, sclerodactyly, telangiectasias,  pulm fibrosis.  No evident Pulm HTN at last check in 2010.  Last CT chest and PFT 10/12 at [facility]  DLCO 50% 10/12 PFT 07/07/2012:  TLC 60%  DLCO 52%   CT Chest 07/07/2012: mild progression of pulmonary fibrosis compared to prior films Echo 07/07/2012  No pulm HTN  Note patient's  current pulmonary status is stable at this time. She has been given a prednisone pulse for increasing arthritis symptoms however there is no change in pulmonary status at this time. Note the patient has not had pulmonary hypertension or progression of pulmonary fibrosis with scleroderma. Plan Per rheumatology at [facility]     Updated Medication List Outpatient Encounter Prescriptions as of 02/08/2013  Medication Sig Dispense Refill  . Adalimumab (HUMIRA PEN [place]) Inject 1 each into the skin every 14 (fourteen) days.      Marland Kitchen albuterol (PROVENTIL HFA;VENTOLIN HFA) 108 (90 BASE) MCG/ACT inhaler Inhale 2 puffs into the lungs every 6 (six) hours as needed.      Marland Kitchen aspirin 81 MG tablet Take 81 mg by mouth daily.      Marland Kitchen azelastine (ASTELIN) 137 MCG/SPRAY nasal spray Place 2 sprays into the nose 2 (two) times daily as needed. Use in each nostril as directed       . Biotin 5 MG CAPS Take 1 capsule by mouth daily.      . clopidogrel (PLAVIX) 300 MG TABS Take 300 mg by mouth daily.       Marland Kitchen estradiol (ESTRACE) 0.5 MG tablet Take 0.5 mg by mouth daily.      . fexofenadine-pseudoephedrine (ALLEGRA-D) 60-120 MG per tablet Take 1 tablet by mouth as needed.      . hydroxychloroquine (PLAQUENIL) 200 MG tablet Take 400 mg by mouth daily.      . pantoprazole (PROTONIX) 20 MG tablet Take 20 mg by mouth 2  (two) times daily.      . predniSONE (DELTASONE) 5 MG tablet Take by mouth. Taper as directed per [facility]      . Rice Protein POWD by Does not apply route daily.      Marland Kitchen zolpidem (AMBIEN) 5 MG tablet Take 12.5 mg by mouth as needed.       . [DISCONTINUED] FOLIC ACID PO Take 1 tablet by mouth daily.      . [DISCONTINUED] predniSONE (DELTASONE) 10 MG tablet Take 5 mg by mouth daily. As directed       No facility-administered encounter medications on file as of 02/08/2013.

## 2013-02-08 NOTE — Patient Instructions (Addendum)
No change in medications. Return in         4 months 

## 2013-02-09 NOTE — Assessment & Plan Note (Signed)
Pulm fibrosis presumably d/t Scleroderma No esophageal involvement. + raynaud, sclerodactyly, telangiectasias,  pulm fibrosis.  No evident Pulm HTN at last check in 2010.  Last CT chest and PFT 10/12 at [facility]  DLCO 50% 10/12 PFT 07/07/2012:  TLC 60%  DLCO 52%   CT Chest 07/07/2012: mild progression of pulmonary fibrosis compared to prior films Echo 07/07/2012  No pulm HTN  Note patient's current pulmonary status is stable at this time. She has been given a prednisone pulse for increasing arthritis symptoms however there is no change in pulmonary status at this time. Note the patient has not had pulmonary hypertension or progression of pulmonary fibrosis with scleroderma. Plan Per rheumatology at [facility]

## 2013-06-26 ENCOUNTER — Encounter: Payer: Self-pay | Admitting: Critical Care Medicine

## 2013-06-26 ENCOUNTER — Ambulatory Visit (INDEPENDENT_AMBULATORY_CARE_PROVIDER_SITE_OTHER): Payer: Medicare Other | Admitting: Critical Care Medicine

## 2013-06-26 VITALS — BP 142/70 | HR 78 | Temp 97.3°F | Ht 63.0 in | Wt 203.2 lb

## 2013-06-26 DIAGNOSIS — J8410 Pulmonary fibrosis, unspecified: Secondary | ICD-10-CM

## 2013-06-26 NOTE — Patient Instructions (Addendum)
No change in medications Obtain full pulmonary functions in October 2014 Return 6 months

## 2013-06-26 NOTE — Progress Notes (Signed)
Subjective:    Patient ID: Sarajane Marek, female    DOB: Jan 16, 1946, 67 y.o.   MRN: [id number]  HPI  67 y.o. F Dx Scleroderma Dx 30yr ago Dr RJustine Null  [facility]  Saw MRandol Kernas well 10/12.   Pt has had several CTs and PFTs.     06/26/2013 Chief Complaint  Patient presents with  . 4 month follow up    Breathing is at baseline - has DOE.  No wheezing, chest tightness, chest pain, or cough at this time.   Dyspnea at [facility], No choking or coughing. Bone scan was normal 01/2013.  No chest pain Pt denies any significant sore throat, nasal congestion or excess secretions, fever, chills, sweats, unintended weight loss, pleurtic or exertional chest pain, orthopnea PND, or leg swelling Pt denies any increase in rescue therapy over baseline, denies waking up needing it or having any early am or nocturnal exacerbations of coughing/wheezing/or dyspnea. Pt also denies any obvious fluctuation in symptoms with  weather or environmental change or other alleviating or aggravating factors    Past Medical History  Diagnosis Date  . Systemic sclerosis   . Osteoarthritis   . Seasonal allergies   . Interstitial lung disease   . H/O: hysterectomy   . H/O bladder repair surgery   . Hx of cholecystectomy   . Rheumatoid arthritis(714.0)   . Raynaud disease   . Hypertension   . Pulmonary fibrosis      Family History  Problem Relation Age of Onset  . Stroke Mother   . Heart attack Father   . Dementia Brother   . Osteoarthritis    . Celiac disease    . Emphysema Father   . Heart disease Mother      History   Social History  . Marital Status: Married    Spouse Name: N/A    Number of Children: 3  . Years of Education: N/A   Occupational History  . Nurse   .     Social History Main Topics  . Smoking status: Former Smoker -- 1.00 packs/day for 20 years    Types: Cigarettes    Quit date: 11/30/1988  . Smokeless tobacco: Never Used  . Alcohol Use: No  . Drug Use: No  .  Sexually Active: Not on file   Other Topics Concern  . Not on file   Social History Narrative  . No narrative on file     Allergies  Allergen Reactions  . Codeine     Neurological problems - hallucinations, "spacey"     Outpatient Prescriptions Prior to Visit  Medication Sig Dispense Refill  . Adalimumab (HUMIRA PEN [place]) Inject 1 each into the skin every 14 (fourteen) days.      .Marland Kitchenalbuterol (PROVENTIL HFA;VENTOLIN HFA) 108 (90 BASE) MCG/ACT inhaler Inhale 2 puffs into the lungs every 6 (six) hours as needed.      .Marland Kitchenaspirin 81 MG tablet Take 81 mg by mouth daily.      .Marland Kitchenazelastine (ASTELIN) 137 MCG/SPRAY nasal spray Place 2 sprays into the nose 2 (two) times daily as needed. Use in each nostril as directed       . Biotin 5 MG CAPS Take 1 capsule by mouth daily.      . clopidogrel (PLAVIX) 300 MG TABS Take 300 mg by mouth daily.       . fexofenadine-pseudoephedrine (ALLEGRA-D) 60-120 MG per tablet Take 1 tablet by mouth as needed.      .Marland Kitchen  hydroxychloroquine (PLAQUENIL) 200 MG tablet Take 400 mg by mouth daily.      . pantoprazole (PROTONIX) 20 MG tablet Take 20 mg by mouth 2 (two) times daily.      . predniSONE (DELTASONE) 5 MG tablet Take 5 mg by mouth daily. Taper as directed per [facility]      . Rice Protein POWD by Does not apply route daily.      Marland Kitchen zolpidem (AMBIEN) 5 MG tablet Take 12.5 mg by mouth as needed.       Marland Kitchen estradiol (ESTRACE) 0.5 MG tablet Take 0.5 mg by mouth daily.       No facility-administered medications prior to visit.      Review of Systems  Constitutional: Positive for fatigue and unexpected weight change. Negative for chills, diaphoresis, activity change and appetite change.  HENT: Negative for hearing loss, nosebleeds, congestion, facial swelling, sneezing, mouth sores, trouble swallowing, neck stiffness, dental problem, voice change, postnasal drip, sinus pressure, tinnitus and ear discharge.   Eyes: Negative for photophobia, discharge, itching and visual  disturbance.  Respiratory: Negative for apnea, cough, choking, chest tightness and stridor.   Cardiovascular: Negative for palpitations.  Gastrointestinal: Negative for nausea, constipation, blood in stool and abdominal distention.  Genitourinary: Positive for urgency. Negative for dysuria, frequency, hematuria, flank pain, decreased urine volume and difficulty urinating.  Musculoskeletal: Positive for myalgias, back pain, joint swelling and arthralgias. Negative for gait problem.  Skin: Negative for color change and pallor.  Neurological: Negative for dizziness, tremors, seizures, syncope, speech difficulty, weakness, light-headedness and numbness.  Hematological: Negative for adenopathy. Bruises/bleeds easily.  Psychiatric/Behavioral: Negative for confusion, sleep disturbance and agitation. The patient is not nervous/anxious.        Objective:   Physical Exam  Filed Vitals:   06/26/13 1446  BP: 142/70  Pulse: 78  Temp: 97.3 F (36.3 C)  TempSrc: Oral  Height: _0  (1.6 m)  Weight: 203 lb 3.2 oz (92.171 kg)  SpO2: 96%    Gen: Pleasant, obese , in no distress,  normal affect  Classic perioral sclerodermal facies  ENT: No lesions,  mouth clear,  oropharynx clear, no postnasal drip  Neck: No JVD, no TMG, no carotid bruits  Lungs: No use of accessory muscles, no dullness to percussion,dry rales at bases  Cardiovascular: RRR, heart sounds normal, no murmur or gallops, no peripheral edema  Abdomen: soft and NT, no HSM,  BS normal  Musculoskeletal: No deformities, no cyanosis or clubbing.  Mild sclerodactyly  Neuro: alert, non focal  Skin: Warm, telangiectasias       Assessment & Plan:   Pulmonary fibrosis Pulmonary fibrosis on the basis of scleroderma with autoimmune disease stable at this time Plan Repeat pulmonary function studies October 2014 No need for repeat imaging at this time No evidence of pulmonary hypertension previously and no indication for repeat  echocardiogram Per rheumatology    Updated Medication List Outpatient Encounter Prescriptions as of 06/26/2013  Medication Sig Dispense Refill  . Adalimumab (HUMIRA PEN ) Inject 1 each into the skin every 14 (fourteen) days.      Marland Kitchen albuterol (PROVENTIL HFA;VENTOLIN HFA) 108 (90 BASE) MCG/ACT inhaler Inhale 2 puffs into the lungs every 6 (six) hours as needed.      Marland Kitchen aspirin 81 MG tablet Take 81 mg by mouth daily.      Marland Kitchen azelastine (ASTELIN) 137 MCG/SPRAY nasal spray Place 2 sprays into the nose 2 (two) times daily as needed. Use in each nostril as directed       .  Biotin 5 MG CAPS Take 1 capsule by mouth daily.      . clopidogrel (PLAVIX) 300 MG TABS Take 300 mg by mouth daily.       . fexofenadine-pseudoephedrine (ALLEGRA-D) 60-120 MG per tablet Take 1 tablet by mouth as needed.      . hydroxychloroquine (PLAQUENIL) 200 MG tablet Take 400 mg by mouth daily.      Marland Kitchen losartan (COZAAR) 100 MG tablet Take by mouth. Take 100 mg by mouth daily.      . pantoprazole (PROTONIX) 20 MG tablet Take 20 mg by mouth 2 (two) times daily.      . predniSONE (DELTASONE) 5 MG tablet Take 5 mg by mouth daily. Taper as directed per [facility]      . Rice Protein POWD by Does not apply route daily.      Marland Kitchen zolpidem (AMBIEN) 5 MG tablet Take 12.5 mg by mouth as needed.       . [DISCONTINUED] estradiol (ESTRACE) 0.5 MG tablet Take 0.5 mg by mouth daily.       No facility-administered encounter medications on file as of 06/26/2013.

## 2013-06-27 NOTE — Assessment & Plan Note (Signed)
Pulmonary fibrosis on the basis of scleroderma with autoimmune disease stable at this time Plan Repeat pulmonary function studies October 2014 No need for repeat imaging at this time No evidence of pulmonary hypertension previously and no indication for repeat echocardiogram Per rheumatology

## 2013-09-05 ENCOUNTER — Ambulatory Visit (INDEPENDENT_AMBULATORY_CARE_PROVIDER_SITE_OTHER): Payer: Medicare Other | Admitting: Critical Care Medicine

## 2013-09-05 DIAGNOSIS — J8410 Pulmonary fibrosis, unspecified: Secondary | ICD-10-CM

## 2013-09-05 NOTE — Progress Notes (Signed)
PFT done today. 

## 2013-09-11 ENCOUNTER — Telehealth: Payer: Self-pay | Admitting: Critical Care Medicine

## 2013-09-11 DIAGNOSIS — J8410 Pulmonary fibrosis, unspecified: Secondary | ICD-10-CM

## 2013-09-11 NOTE — Telephone Encounter (Signed)
Pt given results of PFTs which were improved.  DLCO up to 68% from 53% one year ago!!

## 2013-09-14 ENCOUNTER — Encounter: Payer: Self-pay | Admitting: Critical Care Medicine

## 2013-09-30 ENCOUNTER — Encounter: Payer: Self-pay | Admitting: Critical Care Medicine

## 2014-01-29 ENCOUNTER — Ambulatory Visit: Payer: Medicare Other | Admitting: Critical Care Medicine

## 2014-02-20 LAB — PULMONARY FUNCTION TEST
DL/VA % pred: 107 %
DL/VA: 4.88 ml/min/mmHg/L
DLCO unc % pred: 68 %
DLCO unc: 14.81 ml/min/mmHg
FEF 25-75 Post: 1.82 L/sec
FEF 25-75 Pre: 1.39 L/sec
FEF2575-%Change-Post: 30 %
FEF2575-%Pred-Post: 90 %
FEF2575-%Pred-Pre: 69 %
FEV1-%Change-Post: 5 %
FEV1-%Pred-Post: 74 %
FEV1-%Pred-Pre: 70 %
FEV1-Post: 1.66 L
FEV1-Pre: 1.57 L
FEV1FVC-%Change-Post: 3 %
FEV1FVC-%Pred-Pre: 102 %
FEV6-%Change-Post: 2 %
FEV6-%Pred-Post: 72 %
FEV6-%Pred-Pre: 70 %
FEV6-Post: 2.02 L
FEV6-Pre: 1.98 L
FEV6FVC-%Change-Post: 0 %
FEV6FVC-%Pred-Post: 103 %
FEV6FVC-%Pred-Pre: 103 %
FVC-%Change-Post: 1 %
FVC-%Pred-Post: 69 %
FVC-%Pred-Pre: 68 %
FVC-Post: 2.03 L
Post FEV1/FVC ratio: 82 %
Post FEV6/FVC ratio: 99 %
Pre FEV1/FVC ratio: 79 %
Pre FEV6/FVC Ratio: 99 %
RV % pred: 54 %
RV: 1.08 L
TLC % pred: 63 %
TLC: 3 L

## 2014-03-07 ENCOUNTER — Ambulatory Visit (INDEPENDENT_AMBULATORY_CARE_PROVIDER_SITE_OTHER): Payer: Commercial Managed Care - HMO | Admitting: Critical Care Medicine

## 2014-03-07 ENCOUNTER — Ambulatory Visit (INDEPENDENT_AMBULATORY_CARE_PROVIDER_SITE_OTHER): Payer: Medicare Other | Admitting: Critical Care Medicine

## 2014-03-07 ENCOUNTER — Encounter: Payer: Self-pay | Admitting: Critical Care Medicine

## 2014-03-07 VITALS — BP 124/68 | HR 82 | Temp 99.4°F | Ht 63.5 in | Wt 218.0 lb

## 2014-03-07 DIAGNOSIS — J8410 Pulmonary fibrosis, unspecified: Secondary | ICD-10-CM

## 2014-03-07 DIAGNOSIS — R0600 Dyspnea, unspecified: Secondary | ICD-10-CM

## 2014-03-07 DIAGNOSIS — R0989 Other specified symptoms and signs involving the circulatory and respiratory systems: Secondary | ICD-10-CM

## 2014-03-07 DIAGNOSIS — R0609 Other forms of dyspnea: Secondary | ICD-10-CM

## 2014-03-07 LAB — PULMONARY FUNCTION TEST
DL/VA % PRED: 111 %
DL/VA: 5.08 ml/min/mmHg/L
DLCO unc % pred: 68 %
DLCO unc: 14.71 ml/min/mmHg
FEF 25-75 Pre: 1.15 L/sec
FEF2575-%PRED-PRE: 58 %
FEV1-%PRED-PRE: 69 %
FEV1-Pre: 1.54 L
FEV1FVC-%PRED-PRE: 98 %
FEV6-%Pred-Pre: 72 %
FEV6-PRE: 2.01 L
FEV6FVC-%Pred-Pre: 102 %
FVC-%Pred-Pre: 70 %
FVC-PRE: 2.04 L
PRE FEV1/FVC RATIO: 75 %
PRE FEV6/FVC RATIO: 98 %

## 2014-03-07 NOTE — Progress Notes (Signed)
Subjective:    Patient ID: [name], female    DOB: July 27, 1946, 68 y.o.   MRN: [id number]  HPI  68 y.o. F Dx Scleroderma Dx 60yr ago Dr RJustine Null  [facility]  Saw MRandol Kernas well 10/12.   Pt has had several CTs and PFTs.    03/07/2014 Chief Complaint  Patient presents with  . Follow-up    Increased SOB - progressively getting worse over the past 3-4 months, chest tightness at times, sneezing, and increased cough with clear mucus.  Progressive dyspnea the last few months.  Only dypsneic with exertion, from walking a few feet PFTs 08/2013: improved.  Notes more cough, pt had a viral syndrome 1 month ago.  Notes some sinus issues No wheeze.  Notes some chest pain esp in the back No cardiologist in the past No issues the last ov with Rheum.     Past Medical History  Diagnosis Date  . Systemic sclerosis   . Osteoarthritis   . Seasonal allergies   . Interstitial lung disease   . H/O: hysterectomy   . H/O bladder repair surgery   . Hx of cholecystectomy   . Rheumatoid arthritis(714.0)   . Raynaud disease   . Hypertension   . Pulmonary fibrosis      Family History  Problem Relation Age of Onset  . Stroke Mother   . Heart attack Father   . Dementia Brother   . Osteoarthritis    . Celiac disease    . Emphysema Father   . Heart disease Mother      History   Social History  . Marital Status: Married    Spouse Name: N/A    Number of Children: 3  . Years of Education: N/A   Occupational History  . Nurse   .     Social History Main Topics  . Smoking status: Former Smoker -- 1.00 packs/day for 20 years    Types: Cigarettes    Quit date: 11/30/1988  . Smokeless tobacco: Never Used  . Alcohol Use: No  . Drug Use: No  . Sexual Activity: Not on file   Other Topics Concern  . Not on file   Social History Narrative  . No narrative on file     Allergies  Allergen Reactions  . Codeine     Neurological problems - hallucinations, "spacey"      Outpatient Prescriptions Prior to Visit  Medication Sig Dispense Refill  . Adalimumab (HUMIRA PEN ) Inject 1 each into the skin every 14 (fourteen) days.      .Marland Kitchenalbuterol (PROVENTIL HFA;VENTOLIN HFA) 108 (90 BASE) MCG/ACT inhaler Inhale 2 puffs into the lungs every 6 (six) hours as needed.      .Marland Kitchenaspirin 81 MG tablet Take 81 mg by mouth daily.      .Marland Kitchenazelastine (ASTELIN) 137 MCG/SPRAY nasal spray Place 2 sprays into the nose 2 (two) times daily as needed. Use in each nostril as directed       . Biotin 5 MG CAPS Take 1 capsule by mouth daily.      . clopidogrel (PLAVIX) 300 MG TABS Take 300 mg by mouth daily.       . fexofenadine-pseudoephedrine (ALLEGRA-D) 60-120 MG per tablet Take 1 tablet by mouth as needed.      . hydroxychloroquine (PLAQUENIL) 200 MG tablet Take 400 mg by mouth daily.      .Marland Kitchenlosartan (COZAAR) 100 MG tablet Take by mouth. Take 100  mg by mouth daily.      . pantoprazole (PROTONIX) 20 MG tablet Take 20 mg by mouth 2 (two) times daily.      . predniSONE (DELTASONE) 5 MG tablet Take 5 mg by mouth daily. Taper as directed per [facility]      . Rice Protein POWD by Does not apply route daily.      Marland Kitchen zolpidem (AMBIEN) 5 MG tablet Take 12.5 mg by mouth as needed.        No facility-administered medications prior to visit.      Review of Systems  Constitutional: Positive for fatigue and unexpected weight change. Negative for chills, diaphoresis, activity change and appetite change.  HENT: Negative for congestion, dental problem, ear discharge, facial swelling, hearing loss, mouth sores, nosebleeds, postnasal drip, sinus pressure, sneezing, tinnitus, trouble swallowing and voice change.   Eyes: Negative for photophobia, discharge, itching and visual disturbance.  Respiratory: Negative for apnea, cough, choking, chest tightness and stridor.   Cardiovascular: Negative for palpitations.  Gastrointestinal: Negative for nausea, constipation, blood in stool and abdominal distention.   Genitourinary: Positive for urgency. Negative for dysuria, frequency, hematuria, flank pain, decreased urine volume and difficulty urinating.  Musculoskeletal: Positive for arthralgias, back pain, joint swelling and myalgias. Negative for gait problem and neck stiffness.  Skin: Negative for color change and pallor.  Neurological: Negative for dizziness, tremors, seizures, syncope, speech difficulty, weakness, light-headedness and numbness.  Hematological: Negative for adenopathy. Bruises/bleeds easily.  Psychiatric/Behavioral: Negative for confusion, sleep disturbance and agitation. The patient is not nervous/anxious.        Objective:   Physical Exam  Filed Vitals:   03/07/14 1129  BP: 124/68  Pulse: 82  Temp: 99.4 F (37.4 C)  TempSrc: Oral  Height: 5' 3.5" (1.613 m)  Weight: 218 lb (98.884 kg)  SpO2: 99%    Gen: Pleasant, obese , in no distress,  normal affect  Classic perioral sclerodermal facies  ENT: No lesions,  mouth clear,  oropharynx clear, no postnasal drip  Neck: No JVD, no TMG, no carotid bruits  Lungs: No use of accessory muscles, no dullness to percussion,dry rales at bases  Cardiovascular: RRR, heart sounds normal, no murmur or gallops, no peripheral edema  Abdomen: soft and NT, no HSM,  BS normal  Musculoskeletal: No deformities, no cyanosis or clubbing.  Mild sclerodactyly  Neuro: alert, non focal  Skin: Warm, telangiectasias       Assessment & Plan:   Pulmonary fibrosis Dyspnea with CREST/scleroderma and pulm fibrosis DLCO unchanged compared to 08/2013 value Plan CT chest will be obtained Echo will be obtained No change in medications Referral to Cardiology will be obtained Return 6 weeks     Updated Medication List Outpatient Encounter Prescriptions as of 03/07/2014  Medication Sig  . Adalimumab (HUMIRA PEN [place]) Inject 1 each into the skin every 14 (fourteen) days.  Marland Kitchen albuterol (PROVENTIL HFA;VENTOLIN HFA) 108 (90 BASE) MCG/ACT  inhaler Inhale 2 puffs into the lungs every 6 (six) hours as needed.  Marland Kitchen aspirin 81 MG tablet Take 81 mg by mouth daily.  Marland Kitchen azelastine (ASTELIN) 137 MCG/SPRAY nasal spray Place 2 sprays into the nose 2 (two) times daily as needed. Use in each nostril as directed   . Biotin 5 MG CAPS Take 1 capsule by mouth daily.  . clopidogrel (PLAVIX) 300 MG TABS Take 300 mg by mouth daily.   . fexofenadine-pseudoephedrine (ALLEGRA-D) 60-120 MG per tablet Take 1 tablet by mouth as needed.  . hydroxychloroquine (PLAQUENIL) 200  MG tablet Take 400 mg by mouth daily.  Marland Kitchen losartan (COZAAR) 100 MG tablet Take by mouth. Take 100 mg by mouth daily.  . pantoprazole (PROTONIX) 20 MG tablet Take 20 mg by mouth 2 (two) times daily.  . predniSONE (DELTASONE) 5 MG tablet Take 5 mg by mouth daily. Taper as directed per [facility]  . Rice Protein POWD by Does not apply route daily.  Marland Kitchen zolpidem (AMBIEN) 5 MG tablet Take 12.5 mg by mouth as needed.

## 2014-03-07 NOTE — Progress Notes (Signed)
DLCO and spirometry done today.

## 2014-03-07 NOTE — Patient Instructions (Signed)
DLCO will be obtained CT chest will be obtained Echo will be obtained No change in medications Referral to Cardiology will be obtained Return 6 weeks

## 2014-03-08 ENCOUNTER — Telehealth: Payer: Self-pay | Admitting: Critical Care Medicine

## 2014-03-08 NOTE — Progress Notes (Signed)
Quick Note:  lmomtcb for pt ______ 

## 2014-03-08 NOTE — Assessment & Plan Note (Signed)
Dyspnea with CREST/scleroderma and pulm fibrosis DLCO unchanged compared to 08/2013 value Plan CT chest will be obtained Echo will be obtained No change in medications Referral to Cardiology will be obtained Return 6 weeks

## 2014-03-08 NOTE — Telephone Encounter (Signed)
Result Note     Call pt and tell her PFTs are stable, DLCO are unchanged from 08/2013     I spoke with patient about results and she verbalized understanding and had no questions

## 2014-03-12 ENCOUNTER — Ambulatory Visit (HOSPITAL_COMMUNITY): Payer: Medicare HMO | Attending: Critical Care Medicine | Admitting: Radiology

## 2014-03-12 ENCOUNTER — Ambulatory Visit (INDEPENDENT_AMBULATORY_CARE_PROVIDER_SITE_OTHER)
Admission: RE | Admit: 2014-03-12 | Discharge: 2014-03-12 | Disposition: A | Discharge status: Home / Self Care | Payer: Commercial Managed Care - HMO | Source: Ambulatory Visit | Attending: Critical Care Medicine | Admitting: Critical Care Medicine

## 2014-03-12 DIAGNOSIS — J8410 Pulmonary fibrosis, unspecified: Secondary | ICD-10-CM

## 2014-03-12 DIAGNOSIS — R0600 Dyspnea, unspecified: Secondary | ICD-10-CM

## 2014-03-12 DIAGNOSIS — R0989 Other specified symptoms and signs involving the circulatory and respiratory systems: Secondary | ICD-10-CM

## 2014-03-12 DIAGNOSIS — R0602 Shortness of breath: Secondary | ICD-10-CM | POA: Insufficient documentation

## 2014-03-12 DIAGNOSIS — R0609 Other forms of dyspnea: Secondary | ICD-10-CM

## 2014-03-12 NOTE — Progress Notes (Signed)
Echocardiogram performed.

## 2014-03-13 ENCOUNTER — Other Ambulatory Visit: Payer: Self-pay | Admitting: Cardiology

## 2014-03-13 ENCOUNTER — Ambulatory Visit (INDEPENDENT_AMBULATORY_CARE_PROVIDER_SITE_OTHER): Payer: Commercial Managed Care - HMO | Admitting: Cardiology

## 2014-03-13 ENCOUNTER — Encounter: Payer: Self-pay | Admitting: Cardiology

## 2014-03-13 ENCOUNTER — Encounter (HOSPITAL_COMMUNITY): Payer: Self-pay | Admitting: Pharmacy Technician

## 2014-03-13 ENCOUNTER — Encounter: Payer: Self-pay | Admitting: *Deleted

## 2014-03-13 VITALS — BP 154/71 | HR 87 | Ht 63.0 in | Wt 216.8 lb

## 2014-03-13 DIAGNOSIS — R0989 Other specified symptoms and signs involving the circulatory and respiratory systems: Secondary | ICD-10-CM

## 2014-03-13 DIAGNOSIS — R072 Precordial pain: Secondary | ICD-10-CM

## 2014-03-13 DIAGNOSIS — R079 Chest pain, unspecified: Secondary | ICD-10-CM | POA: Insufficient documentation

## 2014-03-13 DIAGNOSIS — R0609 Other forms of dyspnea: Secondary | ICD-10-CM

## 2014-03-13 DIAGNOSIS — R0600 Dyspnea, unspecified: Secondary | ICD-10-CM

## 2014-03-13 LAB — CBC WITH DIFFERENTIAL/PLATELET
BASOS PCT: 0.9 % (ref 0.0–3.0)
Basophils Absolute: 0.1 10*3/uL (ref 0.0–0.1)
EOS ABS: 0.3 10*3/uL (ref 0.0–0.7)
EOS PCT: 3.6 % (ref 0.0–5.0)
HEMATOCRIT: 39.8 % (ref 36.0–46.0)
HEMOGLOBIN: 13.5 g/dL (ref 12.0–15.0)
LYMPHS ABS: 1.8 10*3/uL (ref 0.7–4.0)
Lymphocytes Relative: 24.6 % (ref 12.0–46.0)
MCHC: 34 g/dL (ref 30.0–36.0)
MCV: 94.8 fl (ref 78.0–100.0)
MONO ABS: 0.8 10*3/uL (ref 0.1–1.0)
Monocytes Relative: 10.2 % (ref 3.0–12.0)
NEUTROS ABS: 4.6 10*3/uL (ref 1.4–7.7)
NEUTROS PCT: 60.7 % (ref 43.0–77.0)
Platelets: 216 10*3/uL (ref 150.0–400.0)
RBC: 4.2 Mil/uL (ref 3.87–5.11)
RDW: 13.2 % (ref 11.5–14.6)
WBC: 7.5 10*3/uL (ref 4.5–10.5)

## 2014-03-13 LAB — BASIC METABOLIC PANEL
BUN: 20 mg/dL (ref 6–23)
CHLORIDE: 105 meq/L (ref 96–112)
CO2: 29 meq/L (ref 19–32)
CREATININE: 0.8 mg/dL (ref 0.4–1.2)
Calcium: 9 mg/dL (ref 8.4–10.5)
GFR: 81 mL/min (ref >60.00)
Glucose, Bld: 114 mg/dL — ABNORMAL HIGH (ref 70–99)
POTASSIUM: 4.6 meq/L (ref 3.5–5.1)
SODIUM: 140 meq/L (ref 135–145)

## 2014-03-13 LAB — PROTIME-INR
INR: 1.1 ratio — AB (ref 0.8–1.0)
Prothrombin Time: 11.7 s (ref 10.2–12.4)

## 2014-03-13 NOTE — Patient Instructions (Signed)
Your physician has requested that you have a cardiac catheterization. Cardiac catheterization is used to diagnose and/or treat various heart conditions. Doctors may recommend this procedure for a number of different reasons. The most common reason is to evaluate chest pain. Chest pain can be a symptom of coronary artery disease (CAD), and cardiac catheterization can show whether plaque is narrowing or blocking your heart's arteries. This procedure is also used to evaluate the valves, as well as measure the blood flow and oxygen levels in different parts of your heart. For further information please visit [email or web address]. Please follow instruction sheet, as given.

## 2014-03-13 NOTE — Assessment & Plan Note (Signed)
Blood pressure mildly elevated. Follow and adjust medicines as needed.

## 2014-03-13 NOTE — Progress Notes (Signed)
   HPI: 68 yo female for evaluation of dyspnea and chest pain. Echo April 2015 showed normal LV function, grade 1 diastolic dysfunction, and mild LAE. PFTs 4/15 with FEV1 1.54 and DLCO 68% predicted. Chest CT 4/15 showed pulmonary fibrosis; hepatic steatosis with cirrhosis. Patient states that for the past one year she has had dyspnea on exertion. This has worsened significantly in the past several months. She now has dyspnea with ambulation to her car. She also notes chest pain. There is an aching sensation in the substernal area and a sharp pain in her back with ambulation relieved with rest. There is associated diaphoresis and dyspnea. No nausea. She denies orthopnea, PND, pedal edema or syncope.  Current Outpatient Prescriptions  Medication Sig Dispense Refill  . Adalimumab (HUMIRA PEN [place]) Inject 1 each into the skin every 14 (fourteen) days.      . albuterol (PROVENTIL HFA;VENTOLIN HFA) 108 (90 BASE) MCG/ACT inhaler Inhale 2 puffs into the lungs every 6 (six) hours as needed.      . aspirin 81 MG tablet Take 81 mg by mouth daily.      . azelastine (ASTELIN) 137 MCG/SPRAY nasal spray Place 2 sprays into the nose 2 (two) times daily as needed. Use in each nostril as directed       . Biotin 5 MG CAPS Take 1 capsule by mouth daily.      . clopidogrel (PLAVIX) 300 MG TABS Take 300 mg by mouth daily.       . fexofenadine-pseudoephedrine (ALLEGRA-D) 60-120 MG per tablet Take 1 tablet by mouth as needed.      . hydroxychloroquine (PLAQUENIL) 200 MG tablet Take 400 mg by mouth daily.      . losartan (COZAAR) 100 MG tablet Take by mouth. Take 100 mg by mouth daily.      . pantoprazole (PROTONIX) 20 MG tablet Take 20 mg by mouth 2 (two) times daily.      . predniSONE (DELTASONE) 5 MG tablet Take 5 mg by mouth daily. Taper as directed per [facility]      . Rice Protein POWD by Does not apply route daily.      . zolpidem (AMBIEN) 5 MG tablet Take 12.5 mg by mouth as needed.        No current  facility-administered medications for this visit.    Allergies  Allergen Reactions  . Codeine     Neurological problems - hallucinations, "spacey"    Past Medical History  Diagnosis Date  . Systemic sclerosis   . Osteoarthritis   . Seasonal allergies   . Interstitial lung disease   . Hx of cholecystectomy   . Rheumatoid arthritis(714.0)   . Raynaud disease   . Hypertension   . Pulmonary fibrosis     Past Surgical History  Procedure Laterality Date  . Vesicovaginal fistula closure w/ tah  1987  . Bladder suspension    . Carpal tunnel release    . Laparoscopic cholecystectomy    . Abdominal hysterectomy    . Tonsillectomy      History   Social History  . Marital Status: Married    Spouse Name: N/A    Number of Children: 3  . Years of Education: N/A   Occupational History  . Nurse   .     Social History Main Topics  . Smoking status: Former Smoker -- 1.00 packs/day for 20 years    Types: Cigarettes    Quit date: 11/30/1988  . Smokeless tobacco: Never   Used  . Alcohol Use: No  . Drug Use: No  . Sexual Activity: Not on file   Other Topics Concern  . Not on file   Social History Narrative  . No narrative on file    Family History  Problem Relation Age of Onset  . Stroke Mother   . Heart attack Father     Died of MI at age 61  . Dementia Brother   . Osteoarthritis    . Celiac disease    . Emphysema Father   . Heart disease Mother     CABG at age 60    ROS: Nonproductive cough but no fevers or chills, hemoptysis, dysphasia, odynophagia, melena, hematochezia, dysuria, hematuria, rash, seizure activity, orthopnea, PND, pedal edema, claudication. Remaining systems are negative.  Physical Exam:   Blood pressure 154/71, pulse 87, height 5' 3" (1.6 m), weight 216 lb 12.8 oz (98.34 kg).  General:  Well developed/obese in NAD Skin warm/dry Patient not depressed No peripheral clubbing Back-normal HEENT-normal/normal eyelids Neck supple/normal carotid  upstroke bilaterally; no bruits; no JVD; no thyromegaly chest - CTA/ normal expansion CV - RRR/normal S1 and S2; no murmurs, rubs or gallops;  PMI nondisplaced Abdomen -NT/ND, no HSM, no mass, + bowel sounds, no bruit 2+ femoral pulses, no bruits Ext-no edema, no chords, 2+ DP Neuro-grossly nonfocal  ECG Sinus rhythm at a rate of 87. Right axis deviation.   

## 2014-03-13 NOTE — Assessment & Plan Note (Signed)
Patient presents with progressive dyspnea on exertion as well as chest pain with exertion. She does have scleroderma with associated pulmonary fibrosis. Her symptoms could be related to pulmonary disease. However her pulmonary disease has not progressed based on pulmonary function tests. Her description also is concerning for angina. She has a strong family history of coronary disease. Plan to proceed with right and left catheterization to exclude coronary disease and to also measure pulmonary pressures. The risks and benefits were discussed and she agrees to proceed.

## 2014-03-13 NOTE — Assessment & Plan Note (Signed)
Management per pulmonary.Right heart catheterization to evaluate for pulmonary hypertension.

## 2014-03-15 ENCOUNTER — Ambulatory Visit (HOSPITAL_COMMUNITY)
Admission: RE | Admit: 2014-03-15 | Discharge: 2014-03-15 | Disposition: A | Discharge status: Home / Self Care | Payer: Medicare HMO | Source: Ambulatory Visit | Attending: Cardiovascular Disease | Admitting: Cardiovascular Disease

## 2014-03-15 ENCOUNTER — Encounter (HOSPITAL_COMMUNITY)
Admission: RE | Disposition: A | Discharge status: Home / Self Care | Payer: Self-pay | Source: Ambulatory Visit | Attending: Cardiovascular Disease

## 2014-03-15 ENCOUNTER — Telehealth: Payer: Self-pay | Admitting: Cardiovascular Disease

## 2014-03-15 DIAGNOSIS — I10 Essential (primary) hypertension: Secondary | ICD-10-CM

## 2014-03-15 DIAGNOSIS — M349 Systemic sclerosis, unspecified: Secondary | ICD-10-CM | POA: Insufficient documentation

## 2014-03-15 DIAGNOSIS — Z7902 Long term (current) use of antithrombotics/antiplatelets: Secondary | ICD-10-CM | POA: Insufficient documentation

## 2014-03-15 DIAGNOSIS — R0789 Other chest pain: Secondary | ICD-10-CM | POA: Insufficient documentation

## 2014-03-15 DIAGNOSIS — K7689 Other specified diseases of liver: Secondary | ICD-10-CM | POA: Insufficient documentation

## 2014-03-15 DIAGNOSIS — K7460 Unspecified cirrhosis of liver: Secondary | ICD-10-CM | POA: Insufficient documentation

## 2014-03-15 DIAGNOSIS — R072 Precordial pain: Secondary | ICD-10-CM

## 2014-03-15 DIAGNOSIS — J8410 Pulmonary fibrosis, unspecified: Secondary | ICD-10-CM | POA: Insufficient documentation

## 2014-03-15 DIAGNOSIS — I519 Heart disease, unspecified: Secondary | ICD-10-CM | POA: Insufficient documentation

## 2014-03-15 DIAGNOSIS — M069 Rheumatoid arthritis, unspecified: Secondary | ICD-10-CM | POA: Insufficient documentation

## 2014-03-15 DIAGNOSIS — R079 Chest pain, unspecified: Secondary | ICD-10-CM

## 2014-03-15 DIAGNOSIS — I7300 Raynaud's syndrome without gangrene: Secondary | ICD-10-CM | POA: Insufficient documentation

## 2014-03-15 DIAGNOSIS — M1990 Unspecified osteoarthritis, unspecified site: Secondary | ICD-10-CM | POA: Insufficient documentation

## 2014-03-15 DIAGNOSIS — Z87891 Personal history of nicotine dependence: Secondary | ICD-10-CM | POA: Insufficient documentation

## 2014-03-15 HISTORY — PX: LEFT HEART CATHETERIZATION WITH CORONARY/GRAFT ANGIOGRAM: SHX5450

## 2014-03-15 LAB — POCT I-STAT 3, VENOUS BLOOD GAS (G3P V)
Acid-Base Excess: 1 mmol/L (ref 0.0–2.0)
Bicarbonate: 27.3 mEq/L — ABNORMAL HIGH (ref 20.0–24.0)
O2 Saturation: 72 %
PO2 VEN: 39 mmHg (ref 30.0–45.0)
TCO2: 29 mmol/L (ref 0–100)
pCO2, Ven: 47 mmHg (ref 45.0–50.0)
pH, Ven: 7.373 — ABNORMAL HIGH (ref 7.250–7.300)

## 2014-03-15 LAB — POCT I-STAT 3, ART BLOOD GAS (G3+)
Bicarbonate: 24.5 mEq/L — ABNORMAL HIGH (ref 20.0–24.0)
O2 Saturation: 93 %
PCO2 ART: 40 mmHg (ref 35.0–45.0)
PO2 ART: 69 mmHg — AB (ref 80.0–100.0)
TCO2: 26 mmol/L (ref 0–100)
pH, Arterial: 7.396 (ref 7.350–7.450)

## 2014-03-15 SURGERY — LEFT HEART CATHETERIZATION WITH CORONARY/GRAFT ANGIOGRAM

## 2014-03-15 MED ORDER — SODIUM CHLORIDE 0.9 % IV SOLN
1.0000 mL/kg/h | INTRAVENOUS | Status: DC
  Administered 2014-03-15: 1 mL/kg/h via INTRAVENOUS

## 2014-03-15 MED ORDER — SODIUM CHLORIDE 0.9 % IJ SOLN: 3.0000 mL | Freq: Two times a day (BID) | INTRAMUSCULAR | Status: DC

## 2014-03-15 MED ORDER — SODIUM CHLORIDE 0.9 % IV SOLN: INTRAVENOUS | Status: DC

## 2014-03-15 MED ORDER — FENTANYL CITRATE 0.05 MG/ML IJ SOLN
INTRAMUSCULAR | Status: AC
  Filled 2014-03-15: qty 2

## 2014-03-15 MED ORDER — FUROSEMIDE 40 MG PO TABS: 40.0000 mg | ORAL_TABLET | Freq: Every day | ORAL | Status: DC

## 2014-03-15 MED ORDER — HEPARIN (PORCINE) IN NACL 2-0.9 UNIT/ML-% IJ SOLN
INTRAMUSCULAR | Status: AC
Start: 2014-03-15 — End: 2014-03-15
  Filled 2014-03-15: qty 1000

## 2014-03-15 MED ORDER — SODIUM CHLORIDE 0.9 % IJ SOLN: 3.0000 mL | INTRAMUSCULAR | Status: DC | PRN

## 2014-03-15 MED ORDER — ASPIRIN 81 MG PO CHEW
81.0000 mg | CHEWABLE_TABLET | ORAL | Status: AC
  Administered 2014-03-15: 81 mg via ORAL
  Filled 2014-03-15: qty 1

## 2014-03-15 MED ORDER — NITROGLYCERIN 0.2 MG/ML ON CALL CATH LAB
INTRAVENOUS | Status: AC
  Filled 2014-03-15: qty 1

## 2014-03-15 MED ORDER — SODIUM CHLORIDE 0.9 % IV SOLN: 250.0000 mL | INTRAVENOUS | Status: DC | PRN

## 2014-03-15 MED ORDER — LIDOCAINE HCL (PF) 1 % IJ SOLN
INTRAMUSCULAR | Status: AC
  Filled 2014-03-15: qty 30

## 2014-03-15 MED ORDER — MIDAZOLAM HCL 2 MG/2ML IJ SOLN
INTRAMUSCULAR | Status: AC
  Filled 2014-03-15: qty 2

## 2014-03-15 NOTE — H&P (View-Only) (Signed)
HPI: 68 yo female for evaluation of dyspnea and chest pain. Echo April 2015 showed normal LV function, grade 1 diastolic dysfunction, and mild LAE. PFTs 4/15 with FEV1 1.54 and DLCO 68% predicted. Chest CT 4/15 showed pulmonary fibrosis; hepatic steatosis with cirrhosis. Patient states that for the past one year she has had dyspnea on exertion. This has worsened significantly in the past several months. She now has dyspnea with ambulation to her car. She also notes chest pain. There is an aching sensation in the substernal area and a sharp pain in her back with ambulation relieved with rest. There is associated diaphoresis and dyspnea. No nausea. She denies orthopnea, PND, pedal edema or syncope.  Current Outpatient Prescriptions  Medication Sig Dispense Refill  . Adalimumab (HUMIRA PEN [place]) Inject 1 each into the skin every 14 (fourteen) days.      Marland Kitchen albuterol (PROVENTIL HFA;VENTOLIN HFA) 108 (90 BASE) MCG/ACT inhaler Inhale 2 puffs into the lungs every 6 (six) hours as needed.      Marland Kitchen aspirin 81 MG tablet Take 81 mg by mouth daily.      Marland Kitchen azelastine (ASTELIN) 137 MCG/SPRAY nasal spray Place 2 sprays into the nose 2 (two) times daily as needed. Use in each nostril as directed       . Biotin 5 MG CAPS Take 1 capsule by mouth daily.      . clopidogrel (PLAVIX) 300 MG TABS Take 300 mg by mouth daily.       . fexofenadine-pseudoephedrine (ALLEGRA-D) 60-120 MG per tablet Take 1 tablet by mouth as needed.      . hydroxychloroquine (PLAQUENIL) 200 MG tablet Take 400 mg by mouth daily.      Marland Kitchen losartan (COZAAR) 100 MG tablet Take by mouth. Take 100 mg by mouth daily.      . pantoprazole (PROTONIX) 20 MG tablet Take 20 mg by mouth 2 (two) times daily.      . predniSONE (DELTASONE) 5 MG tablet Take 5 mg by mouth daily. Taper as directed per [facility]      . Rice Protein POWD by Does not apply route daily.      Marland Kitchen zolpidem (AMBIEN) 5 MG tablet Take 12.5 mg by mouth as needed.        No current  facility-administered medications for this visit.    Allergies  Allergen Reactions  . Codeine     Neurological problems - hallucinations, "spacey"    Past Medical History  Diagnosis Date  . Systemic sclerosis   . Osteoarthritis   . Seasonal allergies   . Interstitial lung disease   . Hx of cholecystectomy   . Rheumatoid arthritis(714.0)   . Raynaud disease   . Hypertension   . Pulmonary fibrosis     Past Surgical History  Procedure Laterality Date  . Vesicovaginal fistula closure w/ tah  1987  . Bladder suspension    . Carpal tunnel release    . Laparoscopic cholecystectomy    . Abdominal hysterectomy    . Tonsillectomy      History   Social History  . Marital Status: Married    Spouse Name: N/A    Number of Children: 3  . Years of Education: N/A   Occupational History  . Nurse   .     Social History Main Topics  . Smoking status: Former Smoker -- 1.00 packs/day for 20 years    Types: Cigarettes    Quit date: 11/30/1988  . Smokeless tobacco: Never  Used  . Alcohol Use: No  . Drug Use: No  . Sexual Activity: Not on file   Other Topics Concern  . Not on file   Social History Narrative  . No narrative on file    Family History  Problem Relation Age of Onset  . Stroke Mother   . Heart attack Father     Died of MI at age 48  . Dementia Brother   . Osteoarthritis    . Celiac disease    . Emphysema Father   . Heart disease Mother     CABG at age 82    ROS: Nonproductive cough but no fevers or chills, hemoptysis, dysphasia, odynophagia, melena, hematochezia, dysuria, hematuria, rash, seizure activity, orthopnea, PND, pedal edema, claudication. Remaining systems are negative.  Physical Exam:   Blood pressure 154/71, pulse 87, height _0  (1.6 m), weight 216 lb 12.8 oz (98.34 kg).  General:  Well developed/obese in NAD Skin warm/dry Patient not depressed No peripheral clubbing Back-normal HEENT-normal/normal eyelids Neck supple/normal carotid  upstroke bilaterally; no bruits; no JVD; no thyromegaly chest - CTA/ normal expansion CV - RRR/normal S1 and S2; no murmurs, rubs or gallops;  PMI nondisplaced Abdomen -NT/ND, no HSM, no mass, + bowel sounds, no bruit 2+ femoral pulses, no bruits Ext-no edema, no chords, 2+ DP Neuro-grossly nonfocal  ECG Sinus rhythm at a rate of 87. Right axis deviation.

## 2014-03-15 NOTE — Interval H&P Note (Signed)
History and Physical Interval Note:  03/15/2014 11:48 AM  [name]  has presented today for cardiac cath with the diagnosis of chest pain and dyspnea.   The various methods of treatment have been discussed with the patient and family. After consideration of risks, benefits and other options for treatment, the patient has consented to  Procedure(s): LEFT AND RIGHT HEART CATHETERIZATION WITH CORONARY ANGIOGRAM (N/A) as a surgical intervention .  The patient's history has been reviewed, patient examined, no change in status, stable for surgery.  I have reviewed the patient's chart and labs.  Questions were answered to the patient's satisfaction.    Cath Lab Visit (complete for each Cath Lab visit)  Clinical Evaluation Leading to the Procedure:   ACS: no  Non-ACS:    Anginal Classification: CCS III  Anti-ischemic medical therapy: No Therapy  Non-Invasive Test Results: No non-invasive testing performed  Prior CABG: No previous CABG        Burnell Blanks

## 2014-03-15 NOTE — Addendum Note (Signed)
Addended by: Toni Amend D on: 03/15/2014 02:18 PM   Modules accepted: Orders

## 2014-03-15 NOTE — CV Procedure (Signed)
      Cardiac Catheterization Operative Report  [name] [id number] 4/16/201512:31 PM Asencion Noble, MD  Procedure Performed:  1. Left Heart Catheterization 2. Selective Coronary Angiography 3. Right Heart Catheterization 4. Left ventricular angiogram  Operator: Lauree Chandler, MD  Indication:   68 yo female with history of pulmonary fibrosis with recent chest pain and worsened dyspnea. Cardiac cath to assess filling pressures and exclude CAD.                              Procedure Details: The risks, benefits, complications, treatment options, and expected outcomes were discussed with the patient. The patient and/or family concurred with the proposed plan, giving informed consent. The patient was brought to the cath lab after IV hydration was begun and oral premedication was given. The patient was further sedated with Versed and Fentanyl. The right groin was prepped and draped in the usual manner. Using the modified Seldinger access technique, a 5 French sheath was placed in the right femoral artery. A 6 French sheath was inserted into the right femoral vein. A multi-purpose catheter was used to perform a right heart catheterization. Standard diagnostic catheters were used to perform selective coronary angiography. A pigtail catheter was used to perform a left ventricular angiogram. There were no immediate complications. The patient was taken to the recovery area in stable condition.   Hemodynamic Findings: Ao: 139/66              LV: 149/10/20 RA: 7                RV: 43/7/9 PA: 43/18 (mean 31)        PCWP: 25 Fick Cardiac Output: 6.9 L/min Fick Cardiac Index: 3.45 L/min/m2 Central Aortic Saturation: 93% Pulmonary Artery Saturation: 72%  Angiographic Findings:  Left main: No obstructive disease.   Left Anterior Descending Artery: Large caliber vessel that courses to the apex. There are two moderate caliber diagonal branches. No obstructive diseaese.   Circumflex  Artery: Large caliber vessel with three moderate caliber obtuse marginal branches. No obstructive disease.   Right Coronary Artery: Large dominant vessel with no obstructive disease.   Left Ventricular Angiogram: LVEF=65-70%.   Impression: 1. No angiographic evidence of CAD 2. Normal LV systolic function 3. Non-cardiac chest pain 4. Slight elevation PA pressures 5. Elevated wedge pressure  Recommendations: Will start low dose Lasix 40 mg po Daily. BMET in one week.        Complications:  None; patient tolerated the procedure well.

## 2014-03-15 NOTE — Telephone Encounter (Signed)
LMTCB 03/15/14_0 :40 pm, 4:00 pm and 5:00 pm/PE

## 2014-03-15 NOTE — Discharge Instructions (Signed)
Arteriogram Care After These instructions give you information on caring for yourself after your procedure. Your doctor may also give you more specific instructions. Call your doctor if you have any problems or questions after your procedure. HOME CARE  Stay in bed the rest of the day.  Keep your leg straight for at least 6 hours.  Do not lift anything heavier than 10 pounds (about a gallon of milk) for 2 days.  Do not walk a lot, run, or drive for 2 days.  Return to normal activities in 2 days or as told by your doctor. Finding out the results of your test Ask when your test results will be ready. Make sure you get your test results. GET HELP RIGHT AWAY IF:   You have fever of 102 F (38.9 C) or higher.  You have more pain in your leg.  The leg that was cut is:  Bleeding.  Puffy (swollen) or red.  Cold.  Pale or changes color.  Weak.  Tingly or numb. If you go to the Emergency Room, tell your nurse that you have had an arteriogram. Take this paper with you to show the nurse. MAKE SURE YOU:  Understand these instructions.  Will watch your condition.  Will get help right away if you are not doing well or get worse. Document Released: 02/12/2009 Document Revised: 02/08/2012 Document Reviewed: 02/12/2009 [facility] Patient Information 2014 [place], [place].

## 2014-03-15 NOTE — Telephone Encounter (Signed)
Hello Triage nurses. Hope that you are having a great day. Ms. counts had cath today. She is followed by Stanford Breed. Can we call in Lasix 40 mg Daily to her pharmacy and arrange a BMET in the office early next week? Follow up [place] in 2 weeks. Thanks, chris

## 2014-03-15 NOTE — Telephone Encounter (Signed)
Lasix 40 mg by mouth Daily and BMET to be drawn early next week ordered. L/M on VM for patient to call office back, as patient needs to be made aware of the Lasix and BMET and follow up appt with Richardson Dopp, PA, (okayed by Dr. Stanford Breed).  Appointment with Richardson Dopp, PA, will be 04/03/14 at 11:10 am. Routed to Triage Nurses so that patient can be notified of above items.

## 2014-03-16 MED ORDER — FUROSEMIDE 40 MG PO TABS: 40.0000 mg | ORAL_TABLET | Freq: Every day | ORAL | Status: DC

## 2014-03-16 NOTE — Progress Notes (Signed)
Quick Note:  lmomtcb for pt ______

## 2014-03-16 NOTE — Telephone Encounter (Signed)
Notified pt to start Lasix 40 mg daily.  She wanted Rx sent to [facility] but also to [facility] [place].  Advised had been sent to [facility] and will also send to [facility].  Also wants to get lab done at [facility] in [place].  Will fax them an orde for BMET to draw on Tuesday (fax [phone]).  Gave her the appointment with Margaret Pyle for 5/5 at 11:10.

## 2014-03-16 NOTE — Addendum Note (Signed)
Addended by: Derek Mound E on: 03/16/2014 10:05 AM   Modules accepted: Orders

## 2014-03-20 ENCOUNTER — Other Ambulatory Visit: Payer: Self-pay | Admitting: Cardiology

## 2014-03-21 LAB — BASIC METABOLIC PANEL
BUN: 18 mg/dL (ref 6–23)
CHLORIDE: 101 meq/L (ref 96–112)
CO2: 25 meq/L (ref 19–32)
CREATININE: 0.78 mg/dL (ref 0.50–1.10)
Calcium: 9.2 mg/dL (ref 8.4–10.5)
Glucose, Bld: 128 mg/dL — ABNORMAL HIGH (ref 70–99)
POTASSIUM: 4.1 meq/L (ref 3.5–5.3)
Sodium: 139 mEq/L (ref 135–145)

## 2014-03-23 ENCOUNTER — Encounter: Payer: Self-pay | Admitting: Cardiology

## 2014-03-23 NOTE — Telephone Encounter (Signed)
  This encounter was created in error - please disregard.

## 2014-03-23 NOTE — Telephone Encounter (Signed)
New message     Want test results

## 2014-04-03 ENCOUNTER — Encounter: Payer: Self-pay | Admitting: Physician Assistant

## 2014-04-03 ENCOUNTER — Ambulatory Visit (INDEPENDENT_AMBULATORY_CARE_PROVIDER_SITE_OTHER): Payer: Commercial Managed Care - HMO | Admitting: Physician Assistant

## 2014-04-03 VITALS — BP 120/72 | HR 80 | Ht 63.0 in | Wt 213.0 lb

## 2014-04-03 DIAGNOSIS — I5032 Chronic diastolic (congestive) heart failure: Secondary | ICD-10-CM

## 2014-04-03 DIAGNOSIS — I10 Essential (primary) hypertension: Secondary | ICD-10-CM

## 2014-04-03 DIAGNOSIS — J8410 Pulmonary fibrosis, unspecified: Secondary | ICD-10-CM

## 2014-04-03 DIAGNOSIS — I509 Heart failure, unspecified: Secondary | ICD-10-CM

## 2014-04-03 NOTE — Progress Notes (Signed)
[place] [place], [place]  [address] Phone: [phone] Fax:  [phone]  Date:  04/03/2014   ID:  Sahra, Converse Apr 04, 1946, MRN [id number]  PCP:  Asencion Noble, MD  Cardiologist:  Dr. Kirk Ruths      History of Present Illness: ARYAA BUNTING is a 68 y.o. female with a hx of HTN, CREST/scleroderma and ILD.  Echo April 2015 showed normal LV function, grade 1 diastolic dysfunction, and mild LAE. PFTs 4/15 with FEV1 1.54 and DLCO 68% predicted. Chest CT 4/15 showed pulmonary fibrosis; hepatic steatosis with cirrhosis. Recently evaluated by Dr. Kirk Ruths with one year of dyspnea on exertion which had worsened significantly in the past several months.  She also noted chest pain. She was set up for R/L HC.  This demonstrated no CAD, slight elevation in PA pressures and elevated PCWP.  She was placed on Lasix and returns for follow up today.  Patient returns for follow up. Her breathing is improved since starting Lasix. However, she remains NYHA class III. She denies syncope. She occasionally gets lightheaded with standing. She sleeps on 2 pillows chronically. She denies PND or LE edema. Chest discomfort is improved as well since starting on Lasix. She still has occasional brief chest pains mainly occurring at rest.   Studies:  - LHC (03/15/14):  RA 7, RV 43/7/9, PA 43/18 (mean 31), PCWP 25, CO 6.9, CI 3.45, no CAD.  - Echo (03/12/14):  Mod LVH, EF 65-70%, no RWMA, Gr 1 DD, mild LAE, mild TR.   Recent Labs: 03/13/2014: Hemoglobin 13.5  03/20/2014: Creatinine 0.78; Potassium 4.1   Wt Readings from Last 3 Encounters:  04/03/14 213 lb (96.616 kg)  03/13/14 216 lb 12.8 oz (98.34 kg)  03/07/14 218 lb (98.884 kg)     Past Medical History  Diagnosis Date  . Systemic sclerosis   . Osteoarthritis   . Seasonal allergies   . Interstitial lung disease   . Hx of cholecystectomy   . Rheumatoid arthritis(714.0)   . Raynaud disease   . Hypertension   . Pulmonary  fibrosis     Current Outpatient Prescriptions  Medication Sig Dispense Refill  . Adalimumab (HUMIRA PEN [place]) Inject 1 each into the skin every 14 (fourteen) days.      Marland Kitchen albuterol (PROVENTIL HFA;VENTOLIN HFA) 108 (90 BASE) MCG/ACT inhaler Inhale 2 puffs into the lungs every 6 (six) hours as needed for wheezing.       Marland Kitchen aspirin 81 MG tablet Take 81 mg by mouth daily.      Marland Kitchen azelastine (ASTELIN) 137 MCG/SPRAY nasal spray Place 2 sprays into the nose 2 (two) times daily as needed for allergies. Use in each nostril as directed      . Biotin 5 MG CAPS Take 1 capsule by mouth daily.      . fexofenadine-pseudoephedrine (ALLEGRA-D) 60-120 MG per tablet Take 1 tablet by mouth as needed (for allergies).       . Flaxseed, Linseed, (GROUND FLAX SEEDS) POWD Take 1 application by mouth daily.      . furosemide (LASIX) 40 MG tablet Take 1 tablet (40 mg total) by mouth daily.  90 tablet  3  . hydroxychloroquine (PLAQUENIL) 200 MG tablet Take 200 mg by mouth 2 (two) times daily.       Marland Kitchen losartan (COZAAR) 100 MG tablet Take by mouth. Take 100 mg by mouth daily.      . pantoprazole (PROTONIX) 40 MG tablet Take 40 mg by  mouth 2 (two) times daily.      . predniSONE (DELTASONE) 5 MG tablet Take 5 mg by mouth daily.       Benjamine Mola Protein POWD Take 1 application by mouth daily.       Marland Kitchen zolpidem (AMBIEN) 10 MG tablet Take 10 mg by mouth at bedtime as needed for sleep.       No current facility-administered medications for this visit.    Allergies:   Codeine   Social History:  The patient  reports that she quit smoking about 25 years ago. Her smoking use included Cigarettes. She has a 20 pack-year smoking history. She has never used smokeless tobacco. She reports that she does not drink alcohol or use illicit drugs.   Family History:  The patient's family history includes Celiac disease in an other family member; Dementia in her brother; Emphysema in her father; Heart attack in her father; Heart disease in her  mother; Osteoarthritis in an other family member; Stroke in her mother.   ROS:  Please see the history of present illness.      All other systems reviewed and negative.   PHYSICAL EXAM: VS:  BP 120/72  Pulse 80  Ht 5' 3" (1.6 m)  Wt 213 lb (96.616 kg)  BMI 37.74 kg/m2 Well nourished, well developed, in no acute distress HEENT: normal Neck: no JVD Cardiac:  normal S1, S2; RRR; no murmur Lungs:  clear to auscultation bilaterally, no wheezing, rhonchi or rales Abd: soft, nontender, no hepatomegaly Ext: no edemaright groin without hematoma or bruit  Skin: warm and dry Neuro:  CNs 2-12 intact, no focal abnormalities noted  EKG:  NSR, HR 80, normal axis, no ST changes     ASSESSMENT AND PLAN:  1. Chronic diastolic CHF (congestive heart failure):  Her breathing is better on Lasix.  Continue current dose.  We discussed the importance of daily weights.  Recent creatinine and K+ ok on current dose of Lasix. 2. Pulmonary fibrosis:  She continues to be short of breath.  This is likely related to diastolic CHF and ILD.  F/u with Dr. Joya Gaskins as planned. 3. Chest Pain:  No CAD on cardiac cath.  No further ischemic workup needed.  4. Hypertension:  Controlled. 5. Disposition:  F/u with Dr. Kirk Ruths in 3 mos.   Signed, Richardson Dopp, PA-C  04/03/2014 11:19 AM

## 2014-04-03 NOTE — Patient Instructions (Signed)
Your physician wants you to follow-up in: [place] DR. CRENSHAW. You will receive a reminder letter in the mail two months in advance. If you don't receive a letter, please call our office to schedule the follow-up appointment.   Your physician recommends that you continue on your current medications as directed. Please refer to the Current Medication list given to you today.

## 2014-04-11 ENCOUNTER — Telehealth: Payer: Self-pay | Admitting: Cardiology

## 2014-04-11 NOTE — Telephone Encounter (Signed)
Spoke with pt, she has noticed for the last couple days a deep muscle type pain in he neck, shoulders and upper arms. it occurs if walking a lot and also with raising her elbows She feels it maybe related to the lasix and potassium Explained she can not take potassium unless we check blood work, recent labs are normal She reports her cough is better, she cont to be SOB She has no edema and her weight is stable She will try cutting the lasix to 20 mg once daily She understands she will have to watch her weight, SOB and swelling closley She will call back if she cont to have problems

## 2014-04-11 NOTE — Telephone Encounter (Signed)
New problem   Pt returning your call.

## 2014-04-18 ENCOUNTER — Encounter: Payer: Self-pay | Admitting: Critical Care Medicine

## 2014-04-18 ENCOUNTER — Other Ambulatory Visit (INDEPENDENT_AMBULATORY_CARE_PROVIDER_SITE_OTHER): Payer: Commercial Managed Care - HMO

## 2014-04-18 ENCOUNTER — Encounter (INDEPENDENT_AMBULATORY_CARE_PROVIDER_SITE_OTHER): Payer: Self-pay

## 2014-04-18 ENCOUNTER — Ambulatory Visit (INDEPENDENT_AMBULATORY_CARE_PROVIDER_SITE_OTHER): Payer: Commercial Managed Care - HMO | Admitting: Critical Care Medicine

## 2014-04-18 VITALS — BP 108/62 | HR 65 | Temp 97.8°F | Ht 63.0 in | Wt 210.6 lb

## 2014-04-18 DIAGNOSIS — J8410 Pulmonary fibrosis, unspecified: Secondary | ICD-10-CM

## 2014-04-18 DIAGNOSIS — I10 Essential (primary) hypertension: Secondary | ICD-10-CM

## 2014-04-18 DIAGNOSIS — E876 Hypokalemia: Secondary | ICD-10-CM

## 2014-04-18 LAB — BASIC METABOLIC PANEL
BUN: 23 mg/dL (ref 6–23)
CHLORIDE: 100 meq/L (ref 96–112)
CO2: 29 mEq/L (ref 19–32)
CREATININE: 0.8 mg/dL (ref 0.4–1.2)
Calcium: 9.4 mg/dL (ref 8.4–10.5)
GFR: 77.44 mL/min (ref >60.00)
Glucose, Bld: 120 mg/dL — ABNORMAL HIGH (ref 70–99)
Potassium: 4.3 mEq/L (ref 3.5–5.1)
Sodium: 138 mEq/L (ref 135–145)

## 2014-04-18 MED ORDER — POTASSIUM CHLORIDE ER 20 MEQ PO TBCR: 20.0000 meq | EXTENDED_RELEASE_TABLET | Freq: Every day | ORAL | Status: DC

## 2014-04-18 NOTE — Progress Notes (Signed)
Subjective:    Patient ID: [name], female    DOB: [birth year], 68 y.o.   MRN: [id number]  HPI  68 y.o. F Dx Scleroderma Dx 84yr ago [name]  Now seeing [name]  Saw [name] well 10/12.   Pt has had several CTs and PFTs.    04/19/2014 Chief Complaint  Patient presents with  . Follow-up    Pt reports improvement in cough--no change in SOB. Pt reports having appt with Cardiologist x 3 weeks ago.--Heart Cath completed .  cough better, dyspne unchanged. Cath normal Echo NO PULM HTN  Stiff LV.  PA pcwp high. Cor clean Lasix 415m/d started.  No K given. Pt ? If needs K supp Pt denies any significant sore throat, nasal congestion or excess secretions, fever, chills, sweats, unintended weight loss, pleurtic or exertional chest pain, orthopnea PND, or leg swelling Pt denies any increase in rescue therapy over baseline, denies waking up needing it or having any early am or nocturnal exacerbations of coughing/wheezing/or dyspnea. Pt also denies any obvious fluctuation in symptoms with  weather or environmental change or other alleviating or aggravating factors  Review of Systems  Constitutional: Positive for fatigue and unexpected weight change. Negative for chills, diaphoresis, activity change and appetite change.  HENT: Negative for congestion, dental problem, ear discharge, facial swelling, hearing loss, mouth sores, nosebleeds, postnasal drip, sinus pressure, sneezing, tinnitus, trouble swallowing and voice change.   Eyes: Negative for photophobia, discharge, itching and visual disturbance.  Respiratory: Negative for apnea, cough, choking, chest tightness and stridor.   Cardiovascular: Negative for palpitations.  Gastrointestinal: Negative for nausea, constipation, blood in stool and abdominal distention.  Genitourinary: Positive for urgency. Negative for dysuria, frequency, hematuria, flank pain, decreased urine volume and difficulty urinating.  Musculoskeletal: Positive  for arthralgias, back pain, joint swelling and myalgias. Negative for gait problem and neck stiffness.  Skin: Negative for color change and pallor.  Neurological: Negative for dizziness, tremors, seizures, syncope, speech difficulty, weakness, light-headedness and numbness.  Hematological: Negative for adenopathy. Bruises/bleeds easily.  Psychiatric/Behavioral: Negative for confusion, sleep disturbance and agitation. The patient is not nervous/anxious.        Objective:   Physical Exam  Filed Vitals:   04/18/14 1149  BP: 108/62  Pulse: 65  Temp: 97.8 F (36.6 C)  TempSrc: Oral  Height: _0  (1.6 m)  Weight: 210 lb 9.6 oz (95.528 kg)  SpO2: 99%    Gen: Pleasant, obese , in no distress,  normal affect  Classic perioral sclerodermal facies  ENT: No lesions,  mouth clear,  oropharynx clear, no postnasal drip  Neck: No JVD, no TMG, no carotid bruits  Lungs: No use of accessory muscles, no dullness to percussion,dry rales at bases  Cardiovascular: RRR, heart sounds normal, no murmur or gallops, no peripheral edema  Abdomen: soft and NT, no HSM,  BS normal  Musculoskeletal: No deformities, no cyanosis or clubbing.  Mild sclerodactyly  Neuro: alert, non focal  Skin: Warm, telangiectasias   Cath reports noted.  Pcwp 25 PAP 48 systolic.  LVH on echo with diastolic dysfunction       Assessment & Plan:   Pulmonary fibrosis PUlm fibrosis d/t scleroderma Poss early pulm HTN as cause for dyspnea Plan No other medication changes Refer to Vickie Awkwardor pulm HTN evaluation   Hypertension Lasix added by cardiology after heart cath Pt was concerned ? Potassium supp Note BMP normal today No need for potassium supp.  Updated Medication List Outpatient Encounter Prescriptions as of 04/18/2014  Medication Sig  . Adalimumab (HUMIRA PEN [place]) Inject 1 each into the skin every 14 (fourteen) days.  Marland Kitchen albuterol (PROVENTIL HFA;VENTOLIN HFA) 108 (90 BASE) MCG/ACT inhaler  Inhale 2 puffs into the lungs every 6 (six) hours as needed for wheezing.   Marland Kitchen aspirin 81 MG tablet Take 81 mg by mouth daily.  Marland Kitchen azelastine (ASTELIN) 137 MCG/SPRAY nasal spray Place 2 sprays into the nose 2 (two) times daily as needed for allergies. Use in each nostril as directed  . Biotin 5 MG CAPS Take 1 capsule by mouth daily.  . fexofenadine-pseudoephedrine (ALLEGRA-D) 60-120 MG per tablet Take 1 tablet by mouth as needed (for allergies).   . Flaxseed, Linseed, (GROUND FLAX SEEDS) POWD Take 1 application by mouth daily.  . furosemide (LASIX) 40 MG tablet Take 1 tablet (40 mg total) by mouth daily.  . hydroxychloroquine (PLAQUENIL) 200 MG tablet Take 200 mg by mouth 2 (two) times daily.   Marland Kitchen losartan (COZAAR) 100 MG tablet Take by mouth. Take 100 mg by mouth daily.  . pantoprazole (PROTONIX) 40 MG tablet Take 40 mg by mouth 2 (two) times daily.  . predniSONE (DELTASONE) 5 MG tablet Take 5 mg by mouth daily.   Benjamine Mola Protein POWD Take 1 application by mouth daily.   Marland Kitchen zolpidem (AMBIEN) 10 MG tablet Take 10 mg by mouth at bedtime as needed for sleep.  . potassium chloride 20 MEQ TBCR Take 20 mEq by mouth daily.

## 2014-04-18 NOTE — Patient Instructions (Addendum)
Labs today Start potassium 80mq daily No other medication changes Refer to BRoselie Awkwardfor pulm HTN evaluation

## 2014-04-19 ENCOUNTER — Telehealth: Payer: Self-pay | Admitting: Critical Care Medicine

## 2014-04-19 NOTE — Progress Notes (Signed)
Quick Note:  Pt aware of lab results and appt for consult with BQ scheduled- see PN dated 04/19/14 ______

## 2014-04-19 NOTE — Progress Notes (Signed)
Quick Note:  Call pt and tell her labs are ok, Her potassium is 4.3. NO need to use/take potassium with this level. I Rx potassium yesterday, she does NOT need to take this.   No change in medications ______

## 2014-04-19 NOTE — Telephone Encounter (Signed)
Notes Recorded by Jonelle Sports, RN on 04/19/2014 at 2:12 PM lmomtcb for pt. When pt calls back, we also need to schedule her a consult appt with BQ for pulm HTN per PW ------  Notes Recorded by Elsie Stain, MD on 04/19/2014 at 11:14 AM Call pt and tell her labs are ok, Her potassium is 4.3. NO need to use/take potassium with this level. I Rx potassium yesterday, she does NOT need to take this.  No change in medications  I spoke with the pt and notified of the above recs  She verbalized understanding  Consult with Dr Lake Bells set for 04/25/14 [facility] updated

## 2014-04-19 NOTE — Progress Notes (Signed)
Quick Note:  lmomtcb for pt. When pt calls back, we also need to schedule her a consult appt with BQ for pulm HTN per PW ______

## 2014-04-19 NOTE — Assessment & Plan Note (Signed)
Lasix added by cardiology after heart cath Pt was concerned ? Potassium supp Note BMP normal today No need for potassium supp.

## 2014-04-19 NOTE — Assessment & Plan Note (Signed)
PUlm fibrosis d/t scleroderma Poss early pulm HTN as cause for dyspnea Plan No other medication changes Refer to Roselie Awkward for pulm HTN evaluation

## 2014-04-25 ENCOUNTER — Encounter: Payer: Self-pay | Admitting: Pulmonary Disease

## 2014-04-25 ENCOUNTER — Ambulatory Visit (INDEPENDENT_AMBULATORY_CARE_PROVIDER_SITE_OTHER): Payer: Commercial Managed Care - HMO | Admitting: Pulmonary Disease

## 2014-04-25 ENCOUNTER — Encounter (INDEPENDENT_AMBULATORY_CARE_PROVIDER_SITE_OTHER): Payer: Self-pay

## 2014-04-25 VITALS — BP 114/66 | HR 68 | Ht 63.0 in | Wt 212.0 lb

## 2014-04-25 DIAGNOSIS — I509 Heart failure, unspecified: Secondary | ICD-10-CM

## 2014-04-25 DIAGNOSIS — I2789 Other specified pulmonary heart diseases: Secondary | ICD-10-CM

## 2014-04-25 DIAGNOSIS — I272 Pulmonary hypertension, unspecified: Secondary | ICD-10-CM

## 2014-04-25 NOTE — Patient Instructions (Signed)
We will refer you to Dr. Haroldine Laws for CHF.   Work on diet and exercise. We will follow up in 3 months, call if you need [place] sooner.

## 2014-04-25 NOTE — Progress Notes (Signed)
   Subjective:    Patient ID: Sarajane Marek, female    DOB: 1945/12/08, 68 y.o.   MRN: [id number]  HPI    Review of Systems  Constitutional: Negative for fever and unexpected weight change.  HENT: Negative for congestion, dental problem, ear pain, nosebleeds, postnasal drip, rhinorrhea, sinus pressure, sneezing, sore throat and trouble swallowing.   Eyes: Negative for redness and itching.  Respiratory: Positive for shortness of breath. Negative for cough, chest tightness and wheezing.   Cardiovascular: Positive for palpitations. Negative for leg swelling.  Gastrointestinal: Negative for nausea and vomiting.  Genitourinary: Negative for dysuria.  Musculoskeletal: Negative for joint swelling.  Skin: Negative for rash.  Neurological: Negative for headaches.  Hematological: Does not bruise/bleed easily.  Psychiatric/Behavioral: Negative for dysphoric mood. The patient is not nervous/anxious.        Objective:   Physical Exam        Assessment & Plan:

## 2014-04-25 NOTE — Assessment & Plan Note (Signed)
At this point it looks like the pulmonary hypertension (mean PA pressure 31) is due primarily to elevated left atrial pressure from diastolic dysfunction.  So this would make her a WHO class II pulmonary hypertension.  It is also possible that her pulmonary fibrosis, though mild and unchanged over the last 7 years could also be contributing. I agree with other physicians who have seen her that it is possible she could obstructive sleep apnea.  So based on this I do not see a reason to proceed with pulmonary vasodilators is is no indication for WHO class II or 3 pulmonary hypertension. However, because of her sclerodermait would be wise for [place] to watch her carefully.  Plan: -Continue care for her diastolic dysfunction, see congestive heart failure clinic -Continue weight loss with diet and exercise -If progression of dyspnea despite diet, weight loss, and adequate control of blood pressure then would consider repeat right heart catheterization to look for WHO class I disease. (At this point I doubt we would find it)

## 2014-04-25 NOTE — Progress Notes (Signed)
Subjective:    Patient ID: Sarajane Marek, female    DOB: 27-Jul-1946, 68 y.o.   MRN: [id number]  HPI  This is a very pleasant 68 year old female with a past medical history significant for scleroderma who comes to my clinic today for evaluation of possible primary pulmonary hypertension. She was diagnosed as scleroderma many years ago and has been on treatment for the past 2-3 years through a rheumatologist at [facility]. She currently takes Humira, prednisone, and hydroxychloroquine. She says that her symptoms have been fairly well controlled on that.  She's been known to have pulmonary fibrotic changes for many years which have been stable since 2009 based on recent imaging.  However, despite the stability of her imaging findings she has developed increasing shortness of breath in the last 3-4 months. She says that she has noticed a dramatic change in her ability to carry out regular activity. She is only able to walk a few feet and carry on household duties for a few minutes at a time before she has to stop and rest. This is a dramatic change compared to before. She has not noticed problems with orthopnea or leg swelling.  Because of her shortness of breath she underwent a right heart catheterization not too long ago. This showed the following:  Hemodynamic Findings:  Ao: 139/66  LV: 149/10/20  RA: 7  RV: 43/7/9  PA: 43/18 (mean 31)  PCWP: 25  Fick Cardiac Output: 6.9 L/min  Fick Cardiac Index: 3.45 L/min/m2  Central Aortic Saturation: 93%  Pulmonary Artery Saturation: 72%  She also had a left heart catheterization which did not show evidence of coronary artery disease.  Since the left heart catheterization she has been put on Lasix and she says that she has been breathing somewhat better. She has also lost 6-8 pounds by following a low carbohydrate diet.  Prior to starting Lasix she had a dry cough. This is resolved since starting the Lasix.  Past Medical History    Diagnosis Date  . Systemic sclerosis   . Osteoarthritis   . Seasonal allergies   . Interstitial lung disease   . Hx of cholecystectomy   . Rheumatoid arthritis(714.0)   . Raynaud disease   . Hypertension   . Pulmonary fibrosis      Family History  Problem Relation Age of Onset  . Stroke Mother   . Heart attack Father     Died of MI at age 80  . Dementia Brother   . Osteoarthritis    . Celiac disease    . Emphysema Father   . Heart disease Mother     CABG at age 12     History   Social History  . Marital Status: Married    Spouse Name: N/A    Number of Children: 3  . Years of Education: N/A   Occupational History  . Nurse   .     Social History Main Topics  . Smoking status: Former Smoker -- 1.00 packs/day for 20 years    Types: Cigarettes    Quit date: 11/30/1988  . Smokeless tobacco: Never Used  . Alcohol Use: No  . Drug Use: No  . Sexual Activity: Not on file   Other Topics Concern  . Not on file   Social History Narrative  . No narrative on file     Allergies  Allergen Reactions  . Codeine     Neurological problems - hallucinations, "spacey"     Outpatient Prescriptions  Prior to Visit  Medication Sig Dispense Refill  . Adalimumab (HUMIRA PEN [place]) Inject 1 each into the skin every 14 (fourteen) days.      Marland Kitchen albuterol (PROVENTIL HFA;VENTOLIN HFA) 108 (90 BASE) MCG/ACT inhaler Inhale 2 puffs into the lungs every 6 (six) hours as needed for wheezing.       Marland Kitchen aspirin 81 MG tablet Take 81 mg by mouth daily.      Marland Kitchen azelastine (ASTELIN) 137 MCG/SPRAY nasal spray Place 2 sprays into the nose 2 (two) times daily as needed for allergies. Use in each nostril as directed      . Biotin 5 MG CAPS Take 1 capsule by mouth daily.      . fexofenadine-pseudoephedrine (ALLEGRA-D) 60-120 MG per tablet Take 1 tablet by mouth as needed (for allergies).       . Flaxseed, Linseed, (GROUND FLAX SEEDS) POWD Take 1 application by mouth daily.      . furosemide (LASIX) 40 MG  tablet Take 1 tablet (40 mg total) by mouth daily.  90 tablet  3  . hydroxychloroquine (PLAQUENIL) 200 MG tablet Take 200 mg by mouth 2 (two) times daily.       Marland Kitchen losartan (COZAAR) 100 MG tablet Take by mouth. Take 100 mg by mouth daily.      . pantoprazole (PROTONIX) 40 MG tablet Take 40 mg by mouth 2 (two) times daily.      . predniSONE (DELTASONE) 5 MG tablet Take 5 mg by mouth daily.       Benjamine Mola Protein POWD Take 1 application by mouth daily.       Marland Kitchen zolpidem (AMBIEN) 10 MG tablet Take 10 mg by mouth at bedtime as needed for sleep.       No facility-administered medications prior to visit.       Review of Systems  Constitutional: Positive for fatigue. Negative for fever, chills, diaphoresis and appetite change.  HENT: Negative for congestion, hearing loss, nosebleeds, postnasal drip, rhinorrhea, sinus pressure, sore throat and trouble swallowing.   Eyes: Negative for discharge, redness and visual disturbance.  Respiratory: Positive for cough and shortness of breath. Negative for choking, chest tightness and wheezing.   Cardiovascular: Negative for chest pain and leg swelling.  Gastrointestinal: Negative for nausea, abdominal pain, diarrhea, constipation and blood in stool.  Genitourinary: Negative for dysuria, frequency and hematuria.  Musculoskeletal: Negative for arthralgias, joint swelling, myalgias and neck stiffness.  Skin: Negative for color change, pallor and rash.  Neurological: Negative for dizziness, seizures, speech difficulty, light-headedness, numbness and headaches.  Hematological: Negative for adenopathy. Does not bruise/bleed easily.       Objective:   Physical Exam Filed Vitals:   04/25/14 1201  BP: 114/66  Pulse: 68  Height: _0  (1.6 m)  Weight: 212 lb (96.163 kg)  SpO2: 100%   RA  Gen: well appearing, no acute distress HEENT: NCAT, PERRL, EOMi, OP clear, neck supple without masses PULM: few crackles in bases, otherwise clear, no wheezing, there is  good air movement CV: RRR, no mgr, no JVD AB: BS+, soft, nontender, no hsm Ext: warm, no edema, no clubbing, no cyanosis Derm: no rash or skin breakdown Neuro: A&Ox4, CN II-XII intact, strength 5/5 in all 4 extremities      Assessment & Plan:   Pulmonary hypertension At this point it looks like the pulmonary hypertension (mean PA pressure 31) is due primarily to elevated left atrial pressure from diastolic dysfunction.  So this would make her a  WHO class II pulmonary hypertension.  It is also possible that her pulmonary fibrosis, though mild and unchanged over the last 7 years could also be contributing. I agree with other physicians who have seen her that it is possible she could obstructive sleep apnea.  So based on this I do not see a reason to proceed with pulmonary vasodilators is is no indication for WHO class II or 3 pulmonary hypertension. However, because of her sclerodermait would be wise for [place] to watch her carefully.  Plan: -Continue care for her diastolic dysfunction, see congestive heart failure clinic -Continue weight loss with diet and exercise -If progression of dyspnea despite diet, weight loss, and adequate control of blood pressure then would consider repeat right heart catheterization to look for WHO class I disease. (At this point I doubt we would find it)   Updated Medication List Outpatient Encounter Prescriptions as of 04/25/2014  Medication Sig  . Adalimumab (HUMIRA PEN [place]) Inject 1 each into the skin every 14 (fourteen) days.  Marland Kitchen albuterol (PROVENTIL HFA;VENTOLIN HFA) 108 (90 BASE) MCG/ACT inhaler Inhale 2 puffs into the lungs every 6 (six) hours as needed for wheezing.   Marland Kitchen aspirin 81 MG tablet Take 81 mg by mouth daily.  Marland Kitchen azelastine (ASTELIN) 137 MCG/SPRAY nasal spray Place 2 sprays into the nose 2 (two) times daily as needed for allergies. Use in each nostril as directed  . Biotin 5 MG CAPS Take 1 capsule by mouth daily.  . fexofenadine-pseudoephedrine  (ALLEGRA-D) 60-120 MG per tablet Take 1 tablet by mouth as needed (for allergies).   . Flaxseed, Linseed, (GROUND FLAX SEEDS) POWD Take 1 application by mouth daily.  . furosemide (LASIX) 40 MG tablet Take 1 tablet (40 mg total) by mouth daily.  . hydroxychloroquine (PLAQUENIL) 200 MG tablet Take 200 mg by mouth 2 (two) times daily.   Marland Kitchen losartan (COZAAR) 100 MG tablet Take by mouth. Take 100 mg by mouth daily.  . pantoprazole (PROTONIX) 40 MG tablet Take 40 mg by mouth 2 (two) times daily.  . predniSONE (DELTASONE) 5 MG tablet Take 5 mg by mouth daily.   Benjamine Mola Protein POWD Take 1 application by mouth daily.   Marland Kitchen zolpidem (AMBIEN) 10 MG tablet Take 10 mg by mouth at bedtime as needed for sleep.

## 2014-05-29 ENCOUNTER — Telehealth: Payer: Self-pay | Admitting: Pulmonary Disease

## 2014-05-29 NOTE — Telephone Encounter (Signed)
Per referral to Dr. Josem Kaufmann in epic: I was doing f/u on this pt's appt & did not see any appt for Dr. Haroldine Laws or any notes in pt's chart regarding this appt. Called the [place] spoke with Raelene Bott who advised that Dr. Haroldine Laws had reviewed this pt's chart and said since she was a pt of Dr. Stanford Breed that he would contact Dr. Lake Bells & make him aware of this so that pt could be referred back to Dr. Stanford Breed for care. Staff message sent to Dr. Lake Bells. Kara Mead  ---   Called spoke with pt. She has not been contacted regarding an appt. She is leaving going out of town on Friday. She wants to know if she needs to be on more medication and what is going on with her appt. Please advise Dr. Lake Bells thanks

## 2014-05-29 NOTE — Telephone Encounter (Signed)
Earlier this week cardiology told me they were going to set her up with [facility] Please call the cardiology office and make sure this is happening

## 2014-05-30 ENCOUNTER — Telehealth: Payer: Self-pay | Admitting: Cardiology

## 2014-05-30 NOTE — Telephone Encounter (Signed)
Follow up scheduled

## 2014-05-30 NOTE — Telephone Encounter (Signed)
Left message for pt to call

## 2014-05-30 NOTE — Telephone Encounter (Signed)
Spoke with [name], pt was referred to dr bensimhon for CHF by pulmonary. Dr bensimhon after reviewing the pt chart has requested she see dr Stanford Breed since she is already established with [place]. Pt need to be scheduled for follow up appt with dr Stanford Breed Will forward for dr Stanford Breed review for help with timeline of when the pt needs to be seen.

## 2014-05-30 NOTE — Telephone Encounter (Signed)
Spoke with Cardiology-- Appt was 03/13/14 with C[facility] No upcoming appts scheduled or documented  Per note below, patient was supposed to have follow up appt with C[facility] per Dr Haroldine Laws. This does not look to have been scheduled.  Need to find out if Dr Haroldine Laws has spoken with Dr Stanford Breed about follow up?  WCB

## 2014-05-30 NOTE — Telephone Encounter (Signed)
New message    Per [place] [facility] pulmonary need clarification on referral to heart failure or has Dr. Stanford Breed been notified for her systolic disfunction.

## 2014-05-30 NOTE — Telephone Encounter (Signed)
4-6 weeks Kirk Ruths

## 2014-05-30 NOTE — Telephone Encounter (Signed)
Fredia Beets RN w/ Dr Stanford Breed returned call.  Explained situation to [place] whom verified understanding.  She stated she will discuss with Dr Stanford Breed, have him review pt's chart to see how soon she needs to follow up w/ him for her diastolic dysfunction and she will call the pt to set up appt.  Pulmonary still needs to speak with patient to update her on this.  Have already left message for pt to call back earlier this morning.  Will continue to await call back.

## 2014-05-30 NOTE — Telephone Encounter (Signed)
To expound on [name]'s documentation, at the 5.27.15 ov w/ BQ a referral was made to the CHF clinic for pt's diastolic dysfunction.  Since pt was already established w/ Dr Stanford Breed, Dr Haroldine Laws rec'd pt follow up with that physician for this care.  As of 6.25.15, a staff message was to be sent regarding follow up with Dr Stanford Breed.    Called Cardiology and spoke with Jari Sportsman who will send a message to Dr Jacalyn Lefevre nurse (at the satellite office today) to ask her to call triage to see if and when pt can be seen by Dr Stanford Breed again for care of her diastolic dysfunction.  Will await call back from Cardiology.  LMOM TCB x1 for pt to provide update.

## 2014-05-30 NOTE — Telephone Encounter (Signed)
Pt aware. Nothing further needed.  Will send to Dr Lake Bells as Juluis Rainier that patient is being taken care of and Dr Jacalyn Lefevre office should be contacting patient to discuss setting up f/u appt soon.

## 2014-06-05 ENCOUNTER — Ambulatory Visit: Payer: Commercial Managed Care - HMO | Admitting: Cardiology

## 2014-06-14 ENCOUNTER — Encounter: Payer: Self-pay | Admitting: Cardiology

## 2014-06-14 ENCOUNTER — Ambulatory Visit (INDEPENDENT_AMBULATORY_CARE_PROVIDER_SITE_OTHER): Payer: Commercial Managed Care - HMO | Admitting: Cardiology

## 2014-06-14 VITALS — BP 112/60 | HR 80 | Ht 63.0 in | Wt 207.8 lb

## 2014-06-14 DIAGNOSIS — R0609 Other forms of dyspnea: Secondary | ICD-10-CM | POA: Diagnosis not present

## 2014-06-14 DIAGNOSIS — I10 Essential (primary) hypertension: Secondary | ICD-10-CM

## 2014-06-14 DIAGNOSIS — J8410 Pulmonary fibrosis, unspecified: Secondary | ICD-10-CM

## 2014-06-14 DIAGNOSIS — R0600 Dyspnea, unspecified: Secondary | ICD-10-CM

## 2014-06-14 DIAGNOSIS — R0989 Other specified symptoms and signs involving the circulatory and respiratory systems: Secondary | ICD-10-CM

## 2014-06-14 DIAGNOSIS — I272 Pulmonary hypertension, unspecified: Secondary | ICD-10-CM

## 2014-06-14 DIAGNOSIS — I2789 Other specified pulmonary heart diseases: Secondary | ICD-10-CM

## 2014-06-14 MED ORDER — FUROSEMIDE 40 MG PO TABS: 40.0000 mg | ORAL_TABLET | Freq: Two times a day (BID) | ORAL | Status: DC

## 2014-06-14 NOTE — Patient Instructions (Signed)
Your physician recommends that you schedule a follow-up appointment in: [place]  Your physician recommends that you HAVE LAB WORK TODAY  Your physician recommends that you return for lab work in: [place]

## 2014-06-14 NOTE — Assessment & Plan Note (Signed)
Patient continues to complain of dyspnea on exertion. This is most likely related to a combination of pulmonary fibrosis and diastolic congestive heart failure. Her pulmonary capillary wedge pressure was elevated at time of previous catheterization. I do not think her pulmonary pressures are high enough to consider vasodilators. I will increase her Lasix to 40 mg twice a day to see if her symptoms improved. Check potassium, renal function and BNP today. Repeat potassium and renal function in one week. Will consider referral to CHF clinic if symptoms do not improve.

## 2014-06-14 NOTE — Assessment & Plan Note (Signed)
Continue steroids. Managed by pulmonary.

## 2014-06-14 NOTE — Assessment & Plan Note (Signed)
Blood pressure controlled. Continue present medications. 

## 2014-06-14 NOTE — Progress Notes (Signed)
HPI: FU diastolic CHF; also hx of HTN, CREST/scleroderma and ILD. Echo April 2015 showed normal LV function, grade 1 diastolic dysfunction, and mild LAE. PFTs 4/15 with FEV1 1.54 and DLCO 68% predicted. Chest CT 4/15 showed pulmonary fibrosis; hepatic steatosis with cirrhosis. Cath 4/15. This demonstrated no CAD, slight elevation in PA pressures and elevated PCWP. She was placed on Lasix. Since last seen she continues to have dyspnea on exertion. No orthopnea, PND, pedal edema, exertional chest pain or syncope.  Studies:  - LHC (03/15/14): RA 7, RV 43/7/9, PA 43/18 (mean 31), PCWP 25, CO 6.9, CI 3.45, no CAD.  - Echo (03/12/14): Mod LVH, EF 65-70%, no RWMA, Gr 1 DD, mild LAE, mild TR.   Current Outpatient Prescriptions  Medication Sig Dispense Refill  . Adalimumab (HUMIRA PEN [place]) Inject 1 each into the skin every 14 (fourteen) days.      Marland Kitchen albuterol (PROVENTIL HFA;VENTOLIN HFA) 108 (90 BASE) MCG/ACT inhaler Inhale 2 puffs into the lungs every 6 (six) hours as needed for wheezing.       Marland Kitchen aspirin 81 MG tablet Take 81 mg by mouth daily.      Marland Kitchen azelastine (ASTELIN) 137 MCG/SPRAY nasal spray Place 2 sprays into the nose 2 (two) times daily as needed for allergies. Use in each nostril as directed      . Biotin 5 MG CAPS Take 1 capsule by mouth daily.      . fexofenadine-pseudoephedrine (ALLEGRA-D) 60-120 MG per tablet Take 1 tablet by mouth as needed (for allergies).       . Flaxseed, Linseed, (GROUND FLAX SEEDS) POWD Take 1 application by mouth daily.      . furosemide (LASIX) 40 MG tablet Take 1 tablet (40 mg total) by mouth daily.  90 tablet  3  . hydroxychloroquine (PLAQUENIL) 200 MG tablet Take 200 mg by mouth 2 (two) times daily.       Marland Kitchen losartan (COZAAR) 100 MG tablet Take by mouth. Take 100 mg by mouth daily.      . pantoprazole (PROTONIX) 40 MG tablet Take 40 mg by mouth 2 (two) times daily.      . predniSONE (DELTASONE) 5 MG tablet Take 5 mg by mouth daily.       Benjamine Mola Protein POWD  Take 1 application by mouth daily.       Marland Kitchen zolpidem (AMBIEN) 10 MG tablet Take 10 mg by mouth at bedtime as needed for sleep.       No current facility-administered medications for this visit.     Past Medical History  Diagnosis Date  . Systemic sclerosis   . Osteoarthritis   . Seasonal allergies   . Interstitial lung disease   . Hx of cholecystectomy   . Rheumatoid arthritis(714.0)   . Raynaud disease   . Hypertension   . Pulmonary fibrosis   . Diastolic congestive heart failure     Past Surgical History  Procedure Laterality Date  . Vesicovaginal fistula closure w/ tah  1987  . Bladder suspension    . Carpal tunnel release    . Laparoscopic cholecystectomy    . Abdominal hysterectomy    . Tonsillectomy      History   Social History  . Marital Status: Married    Spouse Name: N/A    Number of Children: 3  . Years of Education: N/A   Occupational History  . Nurse   .     Social History Main Topics  .  Smoking status: Former Smoker -- 1.00 packs/day for 20 years    Types: Cigarettes    Quit date: 11/30/1988  . Smokeless tobacco: Never Used  . Alcohol Use: No  . Drug Use: No  . Sexual Activity: Not on file   Other Topics Concern  . Not on file   Social History Narrative  . No narrative on file    ROS: no fevers or chills, productive cough, hemoptysis, dysphasia, odynophagia, melena, hematochezia, dysuria, hematuria, rash, seizure activity, orthopnea, PND, pedal edema, claudication. Remaining systems are negative.  Physical Exam: Well-developed well-nourished in no acute distress.  Skin is warm and dry.  HEENT is normal.  Neck is supple.  Chest is clear to auscultation with normal expansion.  Cardiovascular exam is regular rate and rhythm.  Abdominal exam nontender or distended. No masses palpated. Extremities show no edema. neuro grossly intact

## 2014-06-15 LAB — BRAIN NATRIURETIC PEPTIDE: Brain Natriuretic Peptide: 160.7 pg/mL — ABNORMAL HIGH (ref 0.0–100.0)

## 2014-06-15 LAB — BASIC METABOLIC PANEL WITH GFR
BUN: 16 mg/dL (ref 6–23)
CALCIUM: 9.1 mg/dL (ref 8.4–10.5)
CO2: 29 mEq/L (ref 19–32)
Chloride: 103 mEq/L (ref 96–112)
Creat: 0.84 mg/dL (ref 0.50–1.10)
GFR, EST AFRICAN AMERICAN: 84 mL/min
GFR, Est Non African American: 73 mL/min
GLUCOSE: 101 mg/dL — AB (ref 70–99)
Potassium: 4.2 mEq/L (ref 3.5–5.3)
Sodium: 141 mEq/L (ref 135–145)

## 2014-06-26 LAB — BASIC METABOLIC PANEL WITH GFR
BUN: 22 mg/dL (ref 6–23)
CALCIUM: 9.1 mg/dL (ref 8.4–10.5)
CO2: 30 mEq/L (ref 19–32)
CREATININE: 0.91 mg/dL (ref 0.50–1.10)
Chloride: 99 mEq/L (ref 96–112)
GFR, Est African American: 77 mL/min
GFR, Est Non African American: 66 mL/min
GLUCOSE: 112 mg/dL — AB (ref 70–99)
Potassium: 4.4 mEq/L (ref 3.5–5.3)
SODIUM: 138 meq/L (ref 135–145)

## 2014-08-14 ENCOUNTER — Encounter: Payer: Self-pay | Admitting: Cardiology

## 2014-08-14 ENCOUNTER — Ambulatory Visit (INDEPENDENT_AMBULATORY_CARE_PROVIDER_SITE_OTHER): Payer: Commercial Managed Care - HMO | Admitting: Cardiology

## 2014-08-14 VITALS — BP 118/76 | HR 79

## 2014-08-14 DIAGNOSIS — R0609 Other forms of dyspnea: Secondary | ICD-10-CM

## 2014-08-14 DIAGNOSIS — I5032 Chronic diastolic (congestive) heart failure: Secondary | ICD-10-CM | POA: Insufficient documentation

## 2014-08-14 DIAGNOSIS — I10 Essential (primary) hypertension: Secondary | ICD-10-CM

## 2014-08-14 DIAGNOSIS — R0600 Dyspnea, unspecified: Secondary | ICD-10-CM | POA: Insufficient documentation

## 2014-08-14 DIAGNOSIS — I509 Heart failure, unspecified: Secondary | ICD-10-CM

## 2014-08-14 DIAGNOSIS — R0989 Other specified symptoms and signs involving the circulatory and respiratory systems: Secondary | ICD-10-CM

## 2014-08-14 NOTE — Assessment & Plan Note (Signed)
Blood pressure controlled. Continue present medications.

## 2014-08-14 NOTE — Patient Instructions (Signed)
Your physician wants you to follow-up in: [place] will receive a reminder letter in the mail two months in advance. If you don't receive a letter, please call our office to schedule the follow-up appointment.   Your physician recommends that you HAVE LAB WORK TODAY

## 2014-08-14 NOTE — Assessment & Plan Note (Signed)
This is most likely multifactorial including diastolic congestive heart failure and pulmonary fibrosis. Continue present dose of diuretics. Followup pulmonary for pulmonary fibrosis.

## 2014-08-14 NOTE — Progress Notes (Signed)
HPI: FU diastolic CHF; also with history of HTN, CREST/scleroderma and ILD. Echo (03/12/14): Mod LVH, EF 65-70%, no RWMA, Gr 1 DD, mild LAE, mild TR. PFTs 4/15 with FEV1 1.54 and DLCO 68% predicted. Chest CT 4/15 showed pulmonary fibrosis; hepatic steatosis with cirrhosis. LHC (03/15/14): RA 7, RV 43/7/9, PA 43/18 (mean 31), PCWP 25, CO 6.9, CI 3.45, no CAD. Placed on lasix. Since last seen, She does have some dyspnea on exertion but improved. No orthopnea or PND. Cough is much better on high-dose Lasix. No chest pain.  Current Outpatient Prescriptions  Medication Sig Dispense Refill  . Adalimumab (HUMIRA PEN [place]) Inject 1 each into the skin every 14 (fourteen) days.      Marland Kitchen albuterol (PROVENTIL HFA;VENTOLIN HFA) 108 (90 BASE) MCG/ACT inhaler Inhale 2 puffs into the lungs every 6 (six) hours as needed for wheezing.       Marland Kitchen aspirin 81 MG tablet Take 81 mg by mouth daily.      Marland Kitchen azelastine (ASTELIN) 137 MCG/SPRAY nasal spray Place 2 sprays into the nose 2 (two) times daily as needed for allergies. Use in each nostril as directed      . Biotin 5 MG CAPS Take 1 capsule by mouth daily.      . fexofenadine-pseudoephedrine (ALLEGRA-D) 60-120 MG per tablet Take 1 tablet by mouth as needed (for allergies).       . Flaxseed, Linseed, (GROUND FLAX SEEDS) POWD Take 1 application by mouth daily.      . furosemide (LASIX) 40 MG tablet Take 1 tablet (40 mg total) by mouth 2 (two) times daily.  180 tablet  3  . hydroxychloroquine (PLAQUENIL) 200 MG tablet Take 200 mg by mouth 2 (two) times daily.       Marland Kitchen losartan (COZAAR) 100 MG tablet Take by mouth. Take 100 mg by mouth daily.      . pantoprazole (PROTONIX) 40 MG tablet Take 40 mg by mouth 2 (two) times daily.      . predniSONE (DELTASONE) 5 MG tablet Take 5 mg by mouth daily.       Benjamine Mola Protein POWD Take 1 application by mouth daily.       Marland Kitchen zolpidem (AMBIEN) 10 MG tablet Take 10 mg by mouth at bedtime as needed for sleep.       No current  facility-administered medications for this visit.     Past Medical History  Diagnosis Date  . Systemic sclerosis   . Osteoarthritis   . Seasonal allergies   . Interstitial lung disease   . Hx of cholecystectomy   . Rheumatoid arthritis(714.0)   . Raynaud disease   . Hypertension   . Pulmonary fibrosis   . Diastolic congestive heart failure     Past Surgical History  Procedure Laterality Date  . Vesicovaginal fistula closure w/ tah  1987  . Bladder suspension    . Carpal tunnel release    . Laparoscopic cholecystectomy    . Abdominal hysterectomy    . Tonsillectomy      History   Social History  . Marital Status: Married    Spouse Name: N/A    Number of Children: 3  . Years of Education: N/A   Occupational History  . Nurse   .     Social History Main Topics  . Smoking status: Former Smoker -- 1.00 packs/day for 20 years    Types: Cigarettes    Quit date: 11/30/1988  . Smokeless tobacco: Never  Used  . Alcohol Use: No  . Drug Use: No  . Sexual Activity: Not on file   Other Topics Concern  . Not on file   Social History Narrative  . No narrative on file    ROS: no fevers or chills, productive cough, hemoptysis, dysphasia, odynophagia, melena, hematochezia, dysuria, hematuria, rash, seizure activity, orthopnea, PND, pedal edema, claudication. Remaining systems are negative.  Physical Exam: Well-developed well-nourished in no acute distress.  Skin is warm and dry.  HEENT is normal.  Neck is supple.  Chest Minimal basilar crackles Cardiovascular exam is regular rate and rhythm.  Abdominal exam nontender or distended. No masses palpated. Extremities show no edema. neuro grossly intact  Electrocardiogram shows sinus rhythm at a rate of 79 with no ST changes.

## 2014-08-14 NOTE — Assessment & Plan Note (Signed)
Patient appears to be euvolemic on examination. Continue present dose of Lasix. Check potassium, renal function and BNP.

## 2014-08-15 LAB — BASIC METABOLIC PANEL WITH GFR
BUN: 20 mg/dL (ref 6–23)
CALCIUM: 9.1 mg/dL (ref 8.4–10.5)
CO2: 28 mEq/L (ref 19–32)
CREATININE: 0.76 mg/dL (ref 0.50–1.10)
Chloride: 100 mEq/L (ref 96–112)
GFR, EST NON AFRICAN AMERICAN: 82 mL/min
GLUCOSE: 95 mg/dL (ref 70–99)
Potassium: 3.8 mEq/L (ref 3.5–5.3)
Sodium: 138 mEq/L (ref 135–145)

## 2014-08-15 LAB — BRAIN NATRIURETIC PEPTIDE: Brain Natriuretic Peptide: 51.7 pg/mL (ref 0.0–100.0)

## 2014-09-03 ENCOUNTER — Ambulatory Visit (INDEPENDENT_AMBULATORY_CARE_PROVIDER_SITE_OTHER): Payer: Commercial Managed Care - HMO | Admitting: Critical Care Medicine

## 2014-09-03 ENCOUNTER — Encounter: Payer: Self-pay | Admitting: Critical Care Medicine

## 2014-09-03 VITALS — BP 98/50 | HR 80 | Temp 98.1°F | Ht 63.0 in | Wt 217.6 lb

## 2014-09-03 DIAGNOSIS — M069 Rheumatoid arthritis, unspecified: Secondary | ICD-10-CM

## 2014-09-03 DIAGNOSIS — Z23 Encounter for immunization: Secondary | ICD-10-CM

## 2014-09-03 DIAGNOSIS — M349 Systemic sclerosis, unspecified: Secondary | ICD-10-CM

## 2014-09-03 DIAGNOSIS — I270 Primary pulmonary hypertension: Secondary | ICD-10-CM

## 2014-09-03 DIAGNOSIS — J8410 Pulmonary fibrosis, unspecified: Secondary | ICD-10-CM

## 2014-09-03 DIAGNOSIS — I272 Pulmonary hypertension, unspecified: Secondary | ICD-10-CM

## 2014-09-03 DIAGNOSIS — M8580 Other specified disorders of bone density and structure, unspecified site: Secondary | ICD-10-CM | POA: Insufficient documentation

## 2014-09-03 NOTE — Assessment & Plan Note (Signed)
Pulmonary fibrosis, due to rheumatoid arthritis and scleroderma No evidence of progression Plan Repeat PFTs

## 2014-09-03 NOTE — Patient Instructions (Signed)
Flu vaccine was given No change in medications Return 6 months, pulmonary function studies at next visit

## 2014-09-03 NOTE — Assessment & Plan Note (Signed)
Systemic sclerosis with raynaud phenomena but no evidence for pulmonary HTN Plan Observation

## 2014-09-03 NOTE — Assessment & Plan Note (Signed)
RA stable Plan  Cont pred low dose Cont humira

## 2014-09-03 NOTE — Progress Notes (Signed)
Subjective:    Patient ID: [name], female    DOB: 1946/03/28, 68 y.o.   MRN: [id number]  HPI 09/03/2014 Chief Complaint  Patient presents with  . Follow-up    still SOB  Pt now on 40 bid on lasix, helped the cough, did not help dyspnea.  Not much edema.  Pt very dyspneic with any exertion Pt still goes to [employer]  Hx of RA and scleroderma.  No pulm HTN   Review of Systems  Constitutional: Positive for fatigue. Negative for fever, chills, diaphoresis and appetite change.  HENT: Negative for congestion, hearing loss, nosebleeds, postnasal drip, rhinorrhea, sinus pressure, sore throat and trouble swallowing.   Eyes: Negative for discharge, redness and visual disturbance.  Respiratory: Positive for shortness of breath. Negative for cough, choking, chest tightness and wheezing.   Cardiovascular: Negative for chest pain and leg swelling.  Gastrointestinal: Negative for nausea, abdominal pain, diarrhea, constipation and blood in stool.  Genitourinary: Negative for dysuria, frequency and hematuria.  Musculoskeletal: Negative for arthralgias, joint swelling, myalgias and neck stiffness.  Skin: Negative for color change, pallor and rash.  Neurological: Negative for dizziness, seizures, speech difficulty, light-headedness, numbness and headaches.  Hematological: Negative for adenopathy. Does not bruise/bleed easily.       Objective:   Physical Exam  Filed Vitals:   09/03/14 1121  BP: 98/50  Pulse: 80  Temp: 98.1 F (36.7 C)  Height: 5' 3" (1.6 m)  Weight: 98.703 kg (217 lb 9.6 oz)  SpO2: 98%     Gen: well appearing, no acute distress HEENT: NCAT, PERRL, EOMi, OP clear, neck supple without masses PULM: few crackles in bases, otherwise clear, no wheezing, there is good air movement, dry rales at bases CV: RRR, no mgr, no JVD AB: BS+, soft, nontender, no hsm Ext: warm, no edema, no clubbing, no cyanosis Derm: no rash or skin breakdown Neuro: A&Ox4, CN II-XII  intact, strength 5/5 in all 4 extremities      Assessment & Plan:   Pulmonary fibrosis Pulmonary fibrosis, due to rheumatoid arthritis and scleroderma No evidence of progression Plan Repeat PFTs   Systemic sclerosis Systemic sclerosis with raynaud phenomena but no evidence for pulmonary HTN Plan Observation   Chronic rheumatic arthritis RA stable Plan  Cont pred low dose Cont humira    Updated Medication List Outpatient Encounter Prescriptions as of 09/03/2014  Medication Sig  . Adalimumab (HUMIRA PEN [place]) Inject 1 each into the skin every 14 (fourteen) days.  Marland Kitchen albuterol (PROVENTIL HFA;VENTOLIN HFA) 108 (90 BASE) MCG/ACT inhaler Inhale 2 puffs into the lungs every 6 (six) hours as needed for wheezing.   Marland Kitchen aspirin 81 MG tablet Take 81 mg by mouth daily.  Marland Kitchen azelastine (ASTELIN) 137 MCG/SPRAY nasal spray Place 2 sprays into the nose 2 (two) times daily as needed for allergies. Use in each nostril as directed  . Biotin 5 MG CAPS Take 1 capsule by mouth daily.  . fexofenadine-pseudoephedrine (ALLEGRA-D) 60-120 MG per tablet Take 1 tablet by mouth as needed (for allergies).   . Flaxseed, Linseed, (GROUND FLAX SEEDS) POWD Take 1 application by mouth daily.  . furosemide (LASIX) 40 MG tablet Take 1 tablet (40 mg total) by mouth 2 (two) times daily.  . hydroxychloroquine (PLAQUENIL) 200 MG tablet Take 200 mg by mouth 2 (two) times daily.   Marland Kitchen losartan (COZAAR) 100 MG tablet Take by mouth. Take 100 mg by mouth daily.  . pantoprazole (PROTONIX) 40 MG tablet Take 40 mg by  mouth 2 (two) times daily.  . predniSONE (DELTASONE) 5 MG tablet Take 5 mg by mouth daily.   Benjamine Mola Protein POWD Take 1 application by mouth daily.   Marland Kitchen zolpidem (AMBIEN) 10 MG tablet Take 10 mg by mouth at bedtime as needed for sleep.

## 2014-09-07 ENCOUNTER — Telehealth: Payer: Self-pay | Admitting: Cardiology

## 2014-09-07 NOTE — Telephone Encounter (Signed)
Pt called in stating that she is suppose to have some labs drawn and would like to know if she needs to have Met 7 labs drawn when she goes to the lab or have those already been done. Please call  thanks

## 2014-09-07 NOTE — Telephone Encounter (Signed)
Requesting a copy of BMP be faxed to Dr. Willey Blade.  DONE.

## 2014-11-08 ENCOUNTER — Encounter (HOSPITAL_COMMUNITY): Payer: Self-pay | Admitting: Cardiovascular Disease

## 2014-12-14 ENCOUNTER — Telehealth: Payer: Self-pay | Admitting: Cardiology

## 2014-12-14 DIAGNOSIS — R0600 Dyspnea, unspecified: Secondary | ICD-10-CM

## 2014-12-14 DIAGNOSIS — R0609 Other forms of dyspnea: Principal | ICD-10-CM

## 2014-12-14 MED ORDER — FUROSEMIDE 40 MG PO TABS
40.0000 mg | ORAL_TABLET | Freq: Two times a day (BID) | ORAL | Status: DC
Start: 2014-12-14 — End: 2014-12-14

## 2014-12-14 MED ORDER — FUROSEMIDE 40 MG PO TABS: 40.0000 mg | ORAL_TABLET | Freq: Two times a day (BID) | ORAL | Status: DC

## 2014-12-14 NOTE — Telephone Encounter (Signed)
Pt called in stating that she needs a "override prescription" for lasix called in to the [facility] in [place] and then she needs her 90 day prescription called in to [facility]. Please call  Thanks

## 2014-12-14 NOTE — Telephone Encounter (Signed)
Rx(s) sent to pharmacy electronically to local and mail order pharmacies.

## 2014-12-26 DIAGNOSIS — H538 Other visual disturbances: Secondary | ICD-10-CM | POA: Diagnosis not present

## 2014-12-26 DIAGNOSIS — Z79899 Other long term (current) drug therapy: Secondary | ICD-10-CM | POA: Diagnosis not present

## 2014-12-26 DIAGNOSIS — H2513 Age-related nuclear cataract, bilateral: Secondary | ICD-10-CM | POA: Diagnosis not present

## 2014-12-26 DIAGNOSIS — M0609 Rheumatoid arthritis without rheumatoid factor, multiple sites: Secondary | ICD-10-CM | POA: Diagnosis not present

## 2015-01-03 ENCOUNTER — Ambulatory Visit: Payer: Self-pay | Admitting: Podiatry

## 2015-01-08 ENCOUNTER — Ambulatory Visit (INDEPENDENT_AMBULATORY_CARE_PROVIDER_SITE_OTHER): Payer: Commercial Managed Care - HMO | Admitting: Podiatry

## 2015-01-08 ENCOUNTER — Encounter: Payer: Self-pay | Admitting: Podiatry

## 2015-01-08 VITALS — BP 112/61 | HR 99 | Resp 16

## 2015-01-08 DIAGNOSIS — L600 Ingrowing nail: Secondary | ICD-10-CM

## 2015-01-08 DIAGNOSIS — M349 Systemic sclerosis, unspecified: Secondary | ICD-10-CM | POA: Diagnosis not present

## 2015-01-08 MED ORDER — NEOMYCIN-POLYMYXIN-HC 3.5-10000-1 OT SOLN: OTIC | Status: DC

## 2015-01-08 NOTE — Progress Notes (Signed)
Subjective:    Patient ID: [name], female    DOB: [birth year], 69 y.o.   MRN: [id number]  HPI Pt presents with bilateral big toe nail tenderness, possible in-growns. Pain worsens with pressure.   Review of Systems  HENT: Positive for rhinorrhea.   Respiratory: Positive for chest tightness and shortness of breath.   Endocrine: Positive for cold intolerance and heat intolerance.  Musculoskeletal: Positive for back pain and arthralgias.  Allergic/Immunologic: Positive for environmental allergies.       Objective:   Physical Exam: I have reviewed her past mental history medications allergies surgery social history review of systems. Pulses are strongly palpable bilateral. Neurologic sensorium is intact worsens once the monofilament. Degenerative intact bilateral muscle strength is 5 over 5 dorsiflexion plantar flexors inverters and everters all intrinsic musculature is intact. Orthopedic evaluation does demonstrate mild hallux valgus deformity with hammertoe deformity bilateral. Cutaneous evaluation demonstration rated no margins along the fibular border of the hallux right with gross granulation tissue.        Assessment & Plan:  Assessment: Ingrown nail paronychia abscess. The border hallux right.  Plan: Chemical matrixectomy is performed today with local anesthetic and she tolerated the procedure well. She was given both oral and home-going instructions as for the care of this toe and I will follow-up with her in 1 week.

## 2015-01-08 NOTE — Patient Instructions (Signed)

## 2015-01-09 ENCOUNTER — Telehealth: Payer: Self-pay | Admitting: *Deleted

## 2015-01-09 MED ORDER — NEOMYCIN-POLYMYXIN-DEXAMETH 3.5-10000-0.1 OP SUSP: OPHTHALMIC | Status: DC

## 2015-01-09 NOTE — Telephone Encounter (Signed)
Pt states the drops for her toe are not at the pharmacy.  I reordered at the [facility] in [place], left message with the pharmacy location.

## 2015-01-10 ENCOUNTER — Ambulatory Visit: Payer: Self-pay | Admitting: Orthopedic Surgery

## 2015-01-10 NOTE — Progress Notes (Signed)
Preoperative surgical orders have been place into the Epic hospital system for [name] on 01/10/2015, 12:42 PM  by Mickel Crow for surgery on 02-11-2015.  Preop Total Knee orders including Experal, IV Tylenol, and IV Decadron as long as there are no contraindications to the above medications. [name], PA-C

## 2015-01-11 DIAGNOSIS — L03019 Cellulitis of unspecified finger: Secondary | ICD-10-CM | POA: Diagnosis not present

## 2015-01-15 ENCOUNTER — Ambulatory Visit (INDEPENDENT_AMBULATORY_CARE_PROVIDER_SITE_OTHER): Payer: Commercial Managed Care - HMO | Admitting: Podiatry

## 2015-01-15 ENCOUNTER — Encounter: Payer: Self-pay | Admitting: Podiatry

## 2015-01-15 DIAGNOSIS — M349 Systemic sclerosis, unspecified: Secondary | ICD-10-CM

## 2015-01-15 DIAGNOSIS — L600 Ingrowing nail: Secondary | ICD-10-CM

## 2015-01-15 NOTE — Progress Notes (Signed)
She presents today for follow-up fibular border hallux right. She denies fever chills nausea vomiting or pains.  Objective: Vital signs are stable she is alert and oriented 3. Pulses are palpable right foot. Mild area of subcutaneous bleeding beneath the excision site but this should go on to heal uneventfully. See no signs of infection in that there is no erythema cellulitis drainage or odor.  Assessment: Status post matrixectomy fibular border hallux right appears to be healing uneventfully.  Plan: Discontinue Betadine soaked with Epsom salts and water soaks. In the daily Lopid at night continue to soak until completely well notify me with any questions concerns or signs of infection.

## 2015-01-24 NOTE — Progress Notes (Signed)
HPI: FU diastolic CHF; also with history of HTN, CREST/scleroderma and ILD. Echo (03/12/14): Mod LVH, EF 65-70%, no RWMA, Gr 1 DD, mild LAE, mild TR. PFTs 4/15 with FEV1 1.54 and DLCO 68% predicted. Chest CT 4/15 showed pulmonary fibrosis; hepatic steatosis with cirrhosis. LHC (03/15/14): RA 7, RV 43/7/9, PA 43/18 (mean 31), PCWP 25, CO 6.9, CI 3.45, no CAD. Placed on lasix. Since last seen, she does have dyspnea on exertion which is chronic. No orthopnea, PND, pedal edema, palpitations, syncope or chest pain.  Current Outpatient Prescriptions  Medication Sig Dispense Refill  . Adalimumab (HUMIRA PEN [place]) Inject 1 each into the skin every 14 (fourteen) days.    Marland Kitchen albuterol (PROVENTIL HFA;VENTOLIN HFA) 108 (90 BASE) MCG/ACT inhaler Inhale 2 puffs into the lungs every 6 (six) hours as needed for wheezing.     Marland Kitchen aspirin 81 MG tablet Take 81 mg by mouth daily.    Marland Kitchen azelastine (ASTELIN) 137 MCG/SPRAY nasal spray Place 2 sprays into the nose 2 (two) times daily as needed for allergies. Use in each nostril as directed    . Biotin 5 MG CAPS Take 1 capsule by mouth daily.    . fexofenadine-pseudoephedrine (ALLEGRA-D) 60-120 MG per tablet Take 1 tablet by mouth as needed (for allergies).     . Flaxseed, Linseed, (GROUND FLAX SEEDS) POWD Take 1 application by mouth daily.    . furosemide (LASIX) 40 MG tablet Take 1 tablet (40 mg total) by mouth 2 (two) times daily. 180 tablet 2  . hydroxychloroquine (PLAQUENIL) 200 MG tablet Take 200 mg by mouth 2 (two) times daily.     Marland Kitchen losartan (COZAAR) 100 MG tablet Take by mouth. Take 100 mg by mouth daily.    Marland Kitchen neomycin-polymyxin b-dexamethasone (MAXITROL) 3.5-10000-0.1 SUSP Apply to toe after every soak. 1 Bottle 1  . neomycin-polymyxin-hydrocortisone (CORTISPORIN) otic solution 1 drop to affected area daily, as needed.    . pantoprazole (PROTONIX) 40 MG tablet Take 40 mg by mouth 2 (two) times daily.    . predniSONE (DELTASONE) 5 MG tablet Take 5 mg by mouth  daily.     Benjamine Mola Protein POWD Take 1 application by mouth daily.     Marland Kitchen zolpidem (AMBIEN) 10 MG tablet Take 10 mg by mouth at bedtime as needed for sleep.     No current facility-administered medications for this visit.     Past Medical History  Diagnosis Date  . Systemic sclerosis   . Osteoarthritis   . Seasonal allergies   . Interstitial lung disease   . Hx of cholecystectomy   . Rheumatoid arthritis(714.0)   . Raynaud disease   . Hypertension   . Pulmonary fibrosis   . Diastolic congestive heart failure     Past Surgical History  Procedure Laterality Date  . Vesicovaginal fistula closure w/ tah  1987  . Bladder suspension    . Carpal tunnel release    . Laparoscopic cholecystectomy    . Abdominal hysterectomy    . Tonsillectomy    . Left heart catheterization with coronary/graft angiogram  03/15/2014    Procedure: LEFT HEART CATHETERIZATION WITH Beatrix Fetters;  Surgeon: Burnell Blanks, MD;  Location: [facility] CATH LAB;  Service: Cardiovascular;;    History   Social History  . Marital Status: Married    Spouse Name: N/A  . Number of Children: 3  . Years of Education: N/A   Occupational History  . Nurse   .  Social History Main Topics  . Smoking status: Former Smoker -- 1.00 packs/day for 20 years    Types: Cigarettes    Quit date: 11/30/1988  . Smokeless tobacco: Never Used  . Alcohol Use: No  . Drug Use: No  . Sexual Activity: Not on file   Other Topics Concern  . Not on file   Social History Narrative    ROS: knee arthralgias but no fevers or chills, productive cough, hemoptysis, dysphasia, odynophagia, melena, hematochezia, dysuria, hematuria, rash, seizure activity, orthopnea, PND, pedal edema, claudication. Remaining systems are negative.  Physical Exam: Well-developed well-nourished in no acute distress.  Skin is warm and dry.  HEENT is normal.  Neck is supple.  Chest dry basilar crackles Cardiovascular exam is regular rate  and rhythm.  Abdominal exam nontender or distended. No masses palpated. Extremities show no edema. neuro grossly intact  ECG normal sinus rhythm at a rate of 86. Right axis deviation. No ST changes.

## 2015-01-25 ENCOUNTER — Ambulatory Visit (INDEPENDENT_AMBULATORY_CARE_PROVIDER_SITE_OTHER): Payer: Commercial Managed Care - HMO | Admitting: Cardiology

## 2015-01-25 ENCOUNTER — Encounter: Payer: Self-pay | Admitting: Cardiology

## 2015-01-25 VITALS — BP 118/50 | HR 86 | Ht 63.0 in | Wt 211.5 lb

## 2015-01-25 DIAGNOSIS — I5032 Chronic diastolic (congestive) heart failure: Secondary | ICD-10-CM | POA: Diagnosis not present

## 2015-01-25 DIAGNOSIS — R0600 Dyspnea, unspecified: Secondary | ICD-10-CM

## 2015-01-25 DIAGNOSIS — I10 Essential (primary) hypertension: Secondary | ICD-10-CM | POA: Diagnosis not present

## 2015-01-25 DIAGNOSIS — Z01810 Encounter for preprocedural cardiovascular examination: Secondary | ICD-10-CM

## 2015-01-25 NOTE — Assessment & Plan Note (Signed)
Patient is euvolemic on examination. Continue present dose of diuretics.

## 2015-01-25 NOTE — Assessment & Plan Note (Signed)
Patient is euvolemic. Recent catheterization showed no obstructive coronary disease. She may proceed with knee replacement without further cardiac evaluation. Would continue present medications pre-and postoperatively. Would be careful not to over hydrate.

## 2015-01-25 NOTE — Patient Instructions (Signed)
Your physician wants you to follow-up in: [place] will receive a reminder letter in the mail two months in advance. If you don't receive a letter, please call our office to schedule the follow-up appointment.

## 2015-01-25 NOTE — Assessment & Plan Note (Signed)
Multifactorial including pulmonary fibrosis, diastolic congestive heart failure, obesity and deconditioning.

## 2015-01-25 NOTE — Assessment & Plan Note (Signed)
Blood pressure controlled. Continue present medications. 

## 2015-02-06 ENCOUNTER — Encounter (HOSPITAL_COMMUNITY)
Admission: RE | Admit: 2015-02-06 | Discharge: 2015-02-06 | Disposition: A | Discharge status: Home / Self Care | Payer: Commercial Managed Care - HMO | Source: Ambulatory Visit | Attending: Orthopedic Surgery | Admitting: Orthopedic Surgery

## 2015-02-06 ENCOUNTER — Telehealth: Payer: Self-pay | Admitting: Critical Care Medicine

## 2015-02-06 ENCOUNTER — Encounter (HOSPITAL_COMMUNITY): Payer: Self-pay

## 2015-02-06 DIAGNOSIS — Z01818 Encounter for other preprocedural examination: Secondary | ICD-10-CM | POA: Diagnosis not present

## 2015-02-06 DIAGNOSIS — M179 Osteoarthritis of knee, unspecified: Secondary | ICD-10-CM | POA: Insufficient documentation

## 2015-02-06 HISTORY — DX: Reserved for inherently not codable concepts without codable children: IMO0001

## 2015-02-06 HISTORY — DX: Personal history of other medical treatment: Z92.89

## 2015-02-06 HISTORY — DX: Gastro-esophageal reflux disease without esophagitis: K21.9

## 2015-02-06 LAB — CBC
HCT: 42.6 % (ref 36.0–46.0)
HEMOGLOBIN: 14 g/dL (ref 12.0–15.0)
MCH: 32 pg (ref 26.0–34.0)
MCHC: 32.9 g/dL (ref 30.0–36.0)
MCV: 97.5 fL (ref 78.0–100.0)
Platelets: 256 10*3/uL (ref 150–400)
RBC: 4.37 MIL/uL (ref 3.87–5.11)
RDW: 12.6 % (ref 11.5–15.5)
WBC: 8 10*3/uL (ref 4.0–10.5)

## 2015-02-06 LAB — COMPREHENSIVE METABOLIC PANEL
ALT: 35 U/L (ref 0–35)
ANION GAP: 7 (ref 5–15)
AST: 38 U/L — ABNORMAL HIGH (ref 0–37)
Albumin: 4.3 g/dL (ref 3.5–5.2)
Alkaline Phosphatase: 76 U/L (ref 39–117)
BUN: 24 mg/dL — ABNORMAL HIGH (ref 6–23)
CO2: 30 mmol/L (ref 19–32)
Calcium: 9.1 mg/dL (ref 8.4–10.5)
Chloride: 102 mmol/L (ref 96–112)
Creatinine, Ser: 0.91 mg/dL (ref 0.50–1.10)
GFR, EST AFRICAN AMERICAN: 75 mL/min — AB (ref >90)
GFR, EST NON AFRICAN AMERICAN: 64 mL/min — AB (ref >90)
Glucose, Bld: 88 mg/dL (ref 70–99)
Potassium: 4.7 mmol/L (ref 3.5–5.1)
Sodium: 139 mmol/L (ref 135–145)
Total Bilirubin: 1.1 mg/dL (ref 0.3–1.2)
Total Protein: 7.4 g/dL (ref 6.0–8.3)

## 2015-02-06 LAB — URINALYSIS, ROUTINE W REFLEX MICROSCOPIC
GLUCOSE, UA: NEGATIVE mg/dL
HGB URINE DIPSTICK: NEGATIVE
Ketones, ur: NEGATIVE mg/dL
NITRITE: NEGATIVE
Protein, ur: NEGATIVE mg/dL
Specific Gravity, Urine: 1.023 (ref 1.005–1.030)
Urobilinogen, UA: 0.2 mg/dL (ref 0.0–1.0)
pH: 5.5 (ref 5.0–8.0)

## 2015-02-06 LAB — URINE MICROSCOPIC-ADD ON

## 2015-02-06 LAB — PROTIME-INR
INR: 1.1 (ref 0.00–1.49)
PROTHROMBIN TIME: 14.3 s (ref 11.6–15.2)

## 2015-02-06 LAB — SURGICAL PCR SCREEN
MRSA, PCR: NEGATIVE
STAPHYLOCOCCUS AUREUS: NEGATIVE

## 2015-02-06 LAB — APTT: APTT: 41 s — AB (ref 24–37)

## 2015-02-06 NOTE — Telephone Encounter (Signed)
lmtcb x1

## 2015-02-06 NOTE — Patient Instructions (Signed)
Your procedure is scheduled on:  02/11/15  MONDAY  Report to [facility] at [phone]      AM.   Call this number if you have problems the morning of surgery: [phone]        Do not eat food  Or drink :After Midnight. Sunday NIGHT   Take these medicines the morning of surgery with A SIP OF WATER:Pantoprazole, Prednisone May use Allegra  Use Albuterol/ Astelin if needed  [place] .  Contacts, dentures or partial plates, or metal hairpins  can not be worn to surgery. Your family will be responsible for glasses, dentures, hearing aides while you are in surgery  Leave suitcase in the car. After surgery it may be brought to your room.  For patients admitted to the hospital, checkout time is 11:00 AM day of  discharge.         [facility] IS NOT RESPONSIBLE FOR ANY VALUABLES  Patients discharged the day of surgery will not be allowed to drive home. IF going home the day of surgery, you must have a driver and someone to stay with you for the first 24 hours                                                                  [facility] - Preparing for Surgery Before surgery, you can play an important role.  Because skin is not sterile, your skin needs to be as free of germs as possible.  You can reduce the number of germs on your skin by washing with CHG (chlorahexidine gluconate) soap before surgery.  CHG is an antiseptic cleaner which kills germs and bonds with the skin to continue killing germs even after washing. Please DO NOT use if you have an allergy to CHG or antibacterial soaps.  If your skin becomes reddened/irritated stop using the CHG and inform your nurse when you arrive at Short Stay. Do not shave (including legs and underarms) for at least 48 hours prior to the first CHG shower.  You may shave your face/neck. Please follow these instructions carefully:  1.  Shower with CHG Soap the  night before surgery and the  morning of Surgery.  2.  If you choose to wash your hair, wash your hair first as usual with your  normal  shampoo.  3.  After you shampoo, rinse your hair and body thoroughly to remove the  shampoo.                           4.  Use CHG as you would any other liquid soap.  You can apply chg directly  to the skin and wash                       Gently with a scrungie or clean washcloth.  5.  Apply the CHG Soap to your body ONLY FROM THE NECK DOWN.   Do not use on face/ open  Wound or open sores. Avoid contact with eyes, ears mouth and genitals (private parts).                       Wash face,  Genitals (private parts) with your normal soap.             6.  Wash thoroughly, paying special attention to the area where your surgery  will be performed.  7.  Thoroughly rinse your body with warm water from the neck down.  8.  DO NOT shower/wash with your normal soap after using and rinsing off  the CHG Soap.                9.  Pat yourself dry with a clean towel.            10.  Wear clean pajamas.            11.  Place clean sheets on your bed the night of your first shower and do not  sleep with pets. Day of Surgery : Do not apply any lotions/deodorants the morning of surgery.  Please wear clean clothes to the hospital/surgery center.  FAILURE TO FOLLOW THESE INSTRUCTIONS MAY RESULT IN THE CANCELLATION OF YOUR SURGERY PATIENT SIGNATURE_________________________________  NURSE SIGNATURE__________________________________  ________________________________________________________________________  WHAT IS A BLOOD TRANSFUSION? Blood Transfusion Information  A transfusion is the replacement of blood or some of its parts. Blood is made up of multiple cells which provide different functions.  Red blood cells carry oxygen and are used for blood loss replacement.  White blood cells fight against infection.  Platelets control bleeding.  Plasma  helps clot blood.  Other blood products are available for specialized needs, such as hemophilia or other clotting disorders. BEFORE THE TRANSFUSION  Who gives blood for transfusions?   Healthy volunteers who are fully evaluated to make sure their blood is safe. This is blood bank blood. Transfusion therapy is the safest it has ever been in the practice of medicine. Before blood is taken from a donor, a complete history is taken to make sure that person has no history of diseases nor engages in risky social behavior (examples are intravenous drug use or sexual activity with multiple partners). The donor's travel history is screened to minimize risk of transmitting infections, such as malaria. The donated blood is tested for signs of infectious diseases, such as HIV and hepatitis. The blood is then tested to be sure it is compatible with you in order to minimize the chance of a transfusion reaction. If you or a relative donates blood, this is often done in anticipation of surgery and is not appropriate for emergency situations. It takes many days to process the donated blood. RISKS AND COMPLICATIONS Although transfusion therapy is very safe and saves many lives, the main dangers of transfusion include:  1. Getting an infectious disease. 2. Developing a transfusion reaction. This is an allergic reaction to something in the blood you were given. Every precaution is taken to prevent this. The decision to have a blood transfusion has been considered carefully by your caregiver before blood is given. Blood is not given unless the benefits outweigh the risks. AFTER THE TRANSFUSION  Right after receiving a blood transfusion, you will usually feel much better and more energetic. This is especially true if your red blood cells have gotten low (anemic). The transfusion raises the level of the red blood cells which carry oxygen, and this usually causes an energy increase.  The  nurse administering the transfusion  will monitor you carefully for complications. HOME CARE INSTRUCTIONS  No special instructions are needed after a transfusion. You may find your energy is better. Speak with your caregiver about any limitations on activity for underlying diseases you may have. SEEK MEDICAL CARE IF:   Your condition is not improving after your transfusion.  You develop redness or irritation at the intravenous (IV) site. SEEK IMMEDIATE MEDICAL CARE IF:  Any of the following symptoms occur over the next 12 hours:  Shaking chills.  You have a temperature by mouth above 102 F (38.9 C), not controlled by medicine.  Chest, back, or muscle pain.  People around you feel you are not acting correctly or are confused.  Shortness of breath or difficulty breathing.  Dizziness and fainting.  You get a rash or develop hives.  You have a decrease in urine output.  Your urine turns a dark color or changes to pink, red, or brown. Any of the following symptoms occur over the next 10 days:  You have a temperature by mouth above 102 F (38.9 C), not controlled by medicine.  Shortness of breath.  Weakness after normal activity.  The white part of the eye turns yellow (jaundice).  You have a decrease in the amount of urine or are urinating less often.  Your urine turns a dark color or changes to pink, red, or brown. Document Released: 11/13/2000 Document Revised: 02/08/2012 Document Reviewed: 07/02/2008 ExitCare Patient Information 2014 [place].  _______________________________________________________________________  Incentive Spirometer  An incentive spirometer is a tool that can help keep your lungs clear and active. This tool measures how well you are filling your lungs with each breath. Taking long deep breaths may help reverse or decrease the chance of developing breathing (pulmonary) problems (especially infection) following:  A long period of time when you are unable to move or be  active. BEFORE THE PROCEDURE   If the spirometer includes an indicator to show your best effort, your nurse or respiratory therapist will set it to a desired goal.  If possible, sit up straight or lean slightly forward. Try not to slouch.  Hold the incentive spirometer in an upright position. INSTRUCTIONS FOR USE  3. Sit on the edge of your bed if possible, or sit up as far as you can in bed or on a chair. 4. Hold the incentive spirometer in an upright position. 5. Breathe out normally. 6. Place the mouthpiece in your mouth and seal your lips tightly around it. 7. Breathe in slowly and as deeply as possible, raising the piston or the ball toward the top of the column. 8. Hold your breath for 3-5 seconds or for as long as possible. Allow the piston or ball to fall to the bottom of the column. 9. Remove the mouthpiece from your mouth and breathe out normally. 10. Rest for a few seconds and repeat Steps 1 through 7 at least 10 times every 1-2 hours when you are awake. Take your time and take a few normal breaths between deep breaths. 11. The spirometer may include an indicator to show your best effort. Use the indicator as a goal to work toward during each repetition. 12. After each set of 10 deep breaths, practice coughing to be sure your lungs are clear. If you have an incision (the cut made at the time of surgery), support your incision when coughing by placing a pillow or rolled up towels firmly against it. Once you are able to get  out of bed, walk around indoors and cough well. You may stop using the incentive spirometer when instructed by your caregiver.  RISKS AND COMPLICATIONS  Take your time so you do not get dizzy or light-headed.  If you are in pain, you may need to take or ask for pain medication before doing incentive spirometry. It is harder to take a deep breath if you are having pain. AFTER USE  Rest and breathe slowly and easily.  It can be helpful to keep track of a log of  your progress. Your caregiver can provide you with a simple table to help with this. If you are using the spirometer at home, follow these instructions: [place] IF:   You are having difficultly using the spirometer.  You have trouble using the spirometer as often as instructed.  Your pain medication is not giving enough relief while using the spirometer.  You develop fever of 100.5 F (38.1 C) or higher. SEEK IMMEDIATE MEDICAL CARE IF:   You cough up bloody sputum that had not been present before.  You develop fever of 102 F (38.9 C) or greater.  You develop worsening pain at or near the incision site. MAKE SURE YOU:   Understand these instructions.  Will watch your condition.  Will get help right away if you are not doing well or get worse. Document Released: 03/29/2007 Document Revised: 02/08/2012 Document Reviewed: 05/30/2007 [facility] Patient Information 2014 [place], [place].   ________________________________________________________________________

## 2015-02-06 NOTE — Progress Notes (Signed)
lov with clearance  Dr Alanda Amass, rheumatology at [facility]  12/15 on chart.  Patient states she will call Dr Ileana Roup office for clearance

## 2015-02-06 NOTE — Progress Notes (Signed)
Clearance with recommendations dr Willey Blade on chart lov with clearance, and ekg dr Stanford Breed  2/16 epic eccho 4/15 epic, chest ct 4/15 epic lov dr Joya Gaskins 4/15 epic Left message with Andee Poles at [facility] requesting additional clerance notes from pulmonary and rheumatology

## 2015-02-07 ENCOUNTER — Ambulatory Visit (INDEPENDENT_AMBULATORY_CARE_PROVIDER_SITE_OTHER): Payer: Commercial Managed Care - HMO | Admitting: Critical Care Medicine

## 2015-02-07 ENCOUNTER — Telehealth: Payer: Self-pay | Admitting: Critical Care Medicine

## 2015-02-07 DIAGNOSIS — R0600 Dyspnea, unspecified: Secondary | ICD-10-CM

## 2015-02-07 DIAGNOSIS — I272 Pulmonary hypertension, unspecified: Secondary | ICD-10-CM

## 2015-02-07 DIAGNOSIS — J8410 Pulmonary fibrosis, unspecified: Secondary | ICD-10-CM

## 2015-02-07 LAB — PULMONARY FUNCTION TEST
DL/VA % pred: 115 %
DL/VA: 5.25 ml/min/mmHg/L
DLCO UNC: 15.6 ml/min/mmHg
DLCO unc % pred: 72 %
FEF 25-75 Post: 1.41 L/sec
FEF 25-75 Pre: 1.14 L/sec
FEF2575-%Change-Post: 23 %
FEF2575-%Pred-Post: 72 %
FEF2575-%Pred-Pre: 59 %
FEV1-%CHANGE-POST: 3 %
FEV1-%Pred-Post: 71 %
FEV1-%Pred-Pre: 68 %
FEV1-PRE: 1.5 L
FEV1-Post: 1.56 L
FEV1FVC-%CHANGE-POST: 5 %
FEV1FVC-%PRED-PRE: 99 %
FEV6-%CHANGE-POST: 0 %
FEV6-%PRED-POST: 70 %
FEV6-%PRED-PRE: 70 %
FEV6-POST: 1.93 L
FEV6-Pre: 1.94 L
FEV6FVC-%CHANGE-POST: 0 %
FEV6FVC-%PRED-POST: 103 %
FEV6FVC-%Pred-Pre: 102 %
FVC-%CHANGE-POST: -1 %
FVC-%Pred-Post: 67 %
FVC-%Pred-Pre: 68 %
FVC-POST: 1.94 L
FVC-Pre: 1.97 L
PRE FEV1/FVC RATIO: 76 %
PRE FEV6/FVC RATIO: 99 %
Post FEV1/FVC ratio: 80 %
Post FEV6/FVC ratio: 100 %
RV % pred: 50 %
RV: 1.03 L
TLC % pred: 61 %
TLC: 2.92 L

## 2015-02-07 LAB — ABO/RH: ABO/RH(D): A POS

## 2015-02-07 NOTE — Telephone Encounter (Signed)
Received surgical clearance letter and placed in Dr Bettina Gavia look at.  Pt is scheduled for surgery on 02/11/15.  6MW and PFT being done today.

## 2015-02-07 NOTE — Telephone Encounter (Signed)
i cannot do this until she obtains her PFTs ordered 08/2014, never done Need to get full pfts asap and 6 min walk in order to approve surgery pulmonary wise. I dont necessarily need to see her, just the data.  Need this STAT if surgery is so soon

## 2015-02-07 NOTE — Progress Notes (Signed)
cmet results sent to dr Wynelle Link by epic

## 2015-02-07 NOTE — Progress Notes (Signed)
Six Minute Walk Test performed today

## 2015-02-07 NOTE — Progress Notes (Signed)
PFT done today. 

## 2015-02-07 NOTE — Telephone Encounter (Signed)
Pt has been scheduled for SMW today at 10:30am and PFT at 11am. Nothing further was needed.

## 2015-02-07 NOTE — Progress Notes (Signed)
Quick Note:  Spoke with Faith with Dr. Peri Maris office . Advised of pulmonary surgery clearance for total knee replacement. She verbalized understanding and is requesting this be faxed with PW's electronic signature. Results faxed with PW's recs. Faith aware. Nothing further needed. ______

## 2015-02-07 NOTE — Telephone Encounter (Signed)
noted 

## 2015-02-07 NOTE — Progress Notes (Signed)
Micro ua results faxed to dr Boeing by epic

## 2015-02-07 NOTE — Telephone Encounter (Signed)
Spoke with pt. She is scheduled for surgery on 02/11/15. Will need a letter stating she is cleared for surgery from a pulmonary standpoint. Was last seen in 08/2014.  PW - please advise. Thanks.

## 2015-02-08 NOTE — Progress Notes (Signed)
Clearance dr Joya Gaskins in epic dated 02/07/15

## 2015-02-08 NOTE — Progress Notes (Signed)
Quick Note:  Surgery clearance form faxed to Dr. Peri Maris office. ______

## 2015-02-10 ENCOUNTER — Ambulatory Visit: Payer: Self-pay | Admitting: Orthopedic Surgery

## 2015-02-10 NOTE — Progress Notes (Signed)
New consent note has been entered into the orders for the patient to receive a right knee cortisone injection at the time of surgery for the left total knee replacement. [name], PA-C

## 2015-02-10 NOTE — H&P (Signed)
[name] DOB: 01/15/1946 Married / Language: English / Race: White Female Date of Admission:  02/11/2015 CC:  Bilateral Knee Pain, Left greater than Right History of Present Illness (Asa Fath L. Ramil Edgington III PA-C; 01/22/2015 2:32 PM) The patient is a 68 year old female who comes in for a preoperative History and Physical. The patient is scheduled for a left total knee arthroplasty to be performed by Dr. Frank V. Aluisio, MD at [facility] on 02-11-2015. The patient reports left knee (worse than right) symptoms including: pain, instability, locking, catching, giving way and weakness which began 2 month(s) ago without any known injury (they have bothered her intermittently since she was in her 20s).The patient feels that the symptoms are worsening. Prior to being seen today the patient was previously evaluated by a colleague. Past treatment for this problem has included application of ice and non-opioid analgesics (Tylenol). She is a retired orthopedic rehab nurse. She has never had any major injuries to the knees. She has had viscosupplementation in the past and has also had cortisone injections with no relief. She had significant pain with the last cortisone injection and absolutely does not want another one. She is having pain with activity and rest. She is unable to go grocery shopping without significant pain. She even has pain that wakes her up at night. She is not eager to have surgery done but is at a point where she feels that it her only option. She states that the left knee is worse than the right but they both hurt. She says they hurt at all times. It limits what she can and cannot do. Injections have not provided any benefit. She is at a stage now where she is here for evaluation and consideration of surgical treatment. They have been treated conservatively in the past for the above stated problem and despite conservative measures, they continue to have progressive pain and  severe functional limitations and dysfunction. They have failed non-operative management including home exercise, medications, and injections. It is felt that they would benefit from undergoing total joint replacement. Risks and benefits of the procedure have been discussed with the patient and they elect to proceed with surgery. There are no active contraindications to surgery such as ongoing infection or rapidly progressive neurological disease.   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) Pulmonary fibrosis (J84.10) Rheumatoid Arthritis Osteoarthritis Depression Congestive Heart Failure High blood pressure Gastroesophageal Reflux Disease Chronic Obstructive Lung Disease Menopause Pulmonary Fibrosis Scleroderma  Allergies Codeine and Related sensitivity  Family History (Roshan Salamon L Luisenrique Conran, III PA-C; 01/22/2015 2:25 PM) Diabetes Mellitus mother Depression mother Heart Disease mother and father Drug / Alcohol Addiction father Cerebrovascular Accident mother Heart disease in female family member before age 65 Osteoarthritis mother, father, sister and brother Hypertension mother Rheumatoid Arthritis father and brother  Social History (Elby Blackwelder L Branson Kranz, III PA-C; 01/22/2015 2:48 PM) Exercise Exercises weekly; does running / walking Drug/Alcohol Rehab (Previously) no Living situation live with spouse Illicit drug use no Drug/Alcohol Rehab (Currently) no Alcohol use current drinker; drinks wine; only occasionally per week Current work status retired Children 3 Marital status married Pain Contract no Number of flights of stairs before winded 1 Tobacco use former smoker Tobacco / smoke exposure no Advance Directives Living Will, Healthcare POA  Medication History (Shreshta Medley L Ryson Bacha, III PA-C; 01/27/2015 4:49  PM) Ambien (5MG Tablet, Oral) Active. Astelin (137MCG/SPRAY Solution, Nasal) Active. Humira (  Subcutaneous) Specific dose unknown - Active. Proventil HFA (108 (90 Base)MCG/ACT Aerosol Soln, Inhalation) Active. Aspirin EC (81MG Tablet DR, Oral) Active. Biotin (Oral) Specific dose unknown - Active. Hydroxychloroquine Sulfate (200MG Tablet, Oral) Active. Losartan Potassium (100MG Tablet, Oral) Active. PredniSONE (20MG Tablet, Oral) Active.  Past Surgical History (Peighton Mehra L Danecia Underdown, III PA-C; 01/22/2015 2:41 PM) Gallbladder Surgery Date: 2008. laporoscopic Carpal Tunnel Repair Date: 2004. right Tonsillectomy Hysterectomy Date: 1987. partial (non-cancerous)   Review of Systems (Lakeasha Petion L. Gesselle Fitzsimons III PA-C; 01/22/2015 2:42 PM) General Not Present- Chills, Fatigue, Fever, Memory Loss, Night Sweats, Weight Gain and Weight Loss. Skin Not Present- Eczema, Hives, Itching, Lesions and Rash. HEENT Not Present- Dentures, Double Vision, Headache, Hearing Loss, Tinnitus and Visual Loss. Respiratory Present- Cough and Shortness of breath with exertion. Not Present- Allergies, Chronic Cough, Coughing up blood and Shortness of breath at rest. Cardiovascular Present- Palpitations. Not Present- Chest Pain, Difficulty Breathing Lying Down, Murmur, Racing/skipping heartbeats and Swelling. Gastrointestinal Not Present- Abdominal Pain, Bloody Stool, Constipation, Diarrhea, Difficulty Swallowing, Heartburn, Jaundice, Loss of appetitie, Nausea and Vomiting. Female Genitourinary Not Present- Blood in Urine, Discharge, Flank Pain, Incontinence, Painful Urination, Urgency, Urinary frequency, Urinary Retention, Urinating at Night and Weak urinary stream. Musculoskeletal Present- Joint Pain (bilateral knees). Not Present- Back Pain, Joint Swelling, Morning Stiffness, Muscle Pain, Muscle Weakness and Spasms. Neurological Not Present- Blackout spells, Difficulty with balance, Dizziness, Paralysis, Tremor  and Weakness. Psychiatric Not Present- Insomnia.  Vitals (Lanai Conlee L. Viraat Vanpatten III PA-C; 01/22/2015 2:53 PM) 01/22/2015 2:51 PM Weight: 205 lb Height: 63in Weight was reported by patient. Height was reported by patient. Body Surface Area: 1.95 m Body Mass Index: 36.31 kg/m  BP: 124/68 (Sitting, Left Arm, Standard)  Physical Exam (Lucas Exline L. Aladdin Kollmann III PA-C; 01/22/2015 2:55 PM) General Mental Status -Alert, cooperative and good historian. General Appearance-pleasant, Not in acute distress. Orientation-Oriented X3. Build & Nutrition-Well nourished and Well developed.  Head and Neck Head-normocephalic, atraumatic . Neck Global Assessment - supple, no bruit auscultated on the right, no bruit auscultated on the left.  Eye Vision-Wears corrective lenses. Pupil - Bilateral-Regular and Round. Motion - Bilateral-EOMI.  Chest and Lung Exam Auscultation Breath sounds - clear at anterior chest wall and clear at posterior chest wall. Adventitious sounds - No Adventitious sounds.  Cardiovascular Auscultation Rhythm - Regular rate and rhythm. Heart Sounds - S1 WNL and S2 WNL. Murmurs & Other Heart Sounds - Auscultation of the heart reveals - No Murmurs.  Abdomen Inspection Contour - Generalized mild distention. Palpation/Percussion Tenderness - Abdomen is non-tender to palpation. Rigidity (guarding) - Abdomen is soft. Auscultation Auscultation of the abdomen reveals - Bowel sounds normal.  Female Genitourinary Note: Not done, not pertinent to present illness   Musculoskeletal Note: On exam, she is alert and oriented in no apparent distress. Evaluation of her hips show normal range of motion with no discomfort. Her left knee shows no effusion. Range of motion is about 5-100. There is marked crepitus on range of motion. She is tender medial greater than lateral. There is no instability. Her right knee shows no effusion. Range of motion is about  5-115. Moderate crepitus on range of motion. Tender medial greater than lateral with no instability noted. Pulse, sensation, and motor intact both lower extremities.  RADIOGRAPHS: AP both knees and lateral show severe arthritic change medial and patellofemoral both knees, slightly worse on the left than the right.   Assessment & Plan (Dorreen Valiente L. Eniola Cerullo III PA-C; 01/27/2015 4:35 PM) Osteoarthritis of left knee (M17.9) Osteoarthritis of   right knee Note:Surgical Plans: Left Total Knee Replacement and a Right Knee Cortisone Injection  Disposition: Wants to look into [facility]  PCP: Dr. Willey Blade - Patient has been seen preoperatively and felt to be stable for surgery.  IV TXA  Anesthesia Issues: NONE  Signed electronically by Joelene Millin, III PA-C

## 2015-02-11 ENCOUNTER — Inpatient Hospital Stay (HOSPITAL_COMMUNITY): Payer: Commercial Managed Care - HMO | Admitting: Registered Nurse

## 2015-02-11 ENCOUNTER — Inpatient Hospital Stay (HOSPITAL_COMMUNITY)
Admission: RE | Admit: 2015-02-11 | Discharge: 2015-02-13 | DRG: 470 | Disposition: A | Discharge status: Skilled Nursing Facility | Payer: Commercial Managed Care - HMO | Source: Ambulatory Visit | Attending: Orthopedic Surgery | Admitting: Orthopedic Surgery

## 2015-02-11 ENCOUNTER — Encounter (HOSPITAL_COMMUNITY): Payer: Self-pay | Admitting: *Deleted

## 2015-02-11 ENCOUNTER — Encounter (HOSPITAL_COMMUNITY)
Admission: RE | Disposition: A | Discharge status: Skilled Nursing Facility | Payer: Self-pay | Source: Ambulatory Visit | Attending: Orthopedic Surgery

## 2015-02-11 DIAGNOSIS — M179 Osteoarthritis of knee, unspecified: Secondary | ICD-10-CM | POA: Diagnosis present

## 2015-02-11 DIAGNOSIS — Z9049 Acquired absence of other specified parts of digestive tract: Secondary | ICD-10-CM | POA: Diagnosis present

## 2015-02-11 DIAGNOSIS — I10 Essential (primary) hypertension: Secondary | ICD-10-CM | POA: Diagnosis present

## 2015-02-11 DIAGNOSIS — M6281 Muscle weakness (generalized): Secondary | ICD-10-CM | POA: Diagnosis not present

## 2015-02-11 DIAGNOSIS — R278 Other lack of coordination: Secondary | ICD-10-CM | POA: Diagnosis not present

## 2015-02-11 DIAGNOSIS — M25562 Pain in left knee: Secondary | ICD-10-CM | POA: Diagnosis present

## 2015-02-11 DIAGNOSIS — M1710 Unilateral primary osteoarthritis, unspecified knee: Secondary | ICD-10-CM | POA: Diagnosis present

## 2015-02-11 DIAGNOSIS — Z471 Aftercare following joint replacement surgery: Secondary | ICD-10-CM | POA: Diagnosis not present

## 2015-02-11 DIAGNOSIS — R2681 Unsteadiness on feet: Secondary | ICD-10-CM | POA: Diagnosis not present

## 2015-02-11 DIAGNOSIS — M349 Systemic sclerosis, unspecified: Secondary | ICD-10-CM | POA: Diagnosis not present

## 2015-02-11 DIAGNOSIS — Z01812 Encounter for preprocedural laboratory examination: Secondary | ICD-10-CM

## 2015-02-11 DIAGNOSIS — Z7982 Long term (current) use of aspirin: Secondary | ICD-10-CM | POA: Diagnosis not present

## 2015-02-11 DIAGNOSIS — Z79899 Other long term (current) drug therapy: Secondary | ICD-10-CM

## 2015-02-11 DIAGNOSIS — M170 Bilateral primary osteoarthritis of knee: Secondary | ICD-10-CM | POA: Diagnosis not present

## 2015-02-11 DIAGNOSIS — J449 Chronic obstructive pulmonary disease, unspecified: Secondary | ICD-10-CM | POA: Diagnosis not present

## 2015-02-11 DIAGNOSIS — I5032 Chronic diastolic (congestive) heart failure: Secondary | ICD-10-CM | POA: Diagnosis present

## 2015-02-11 DIAGNOSIS — K219 Gastro-esophageal reflux disease without esophagitis: Secondary | ICD-10-CM | POA: Diagnosis not present

## 2015-02-11 DIAGNOSIS — Z96652 Presence of left artificial knee joint: Secondary | ICD-10-CM | POA: Diagnosis not present

## 2015-02-11 DIAGNOSIS — Z87891 Personal history of nicotine dependence: Secondary | ICD-10-CM | POA: Diagnosis not present

## 2015-02-11 DIAGNOSIS — M25662 Stiffness of left knee, not elsewhere classified: Secondary | ICD-10-CM | POA: Diagnosis not present

## 2015-02-11 DIAGNOSIS — M1712 Unilateral primary osteoarthritis, left knee: Secondary | ICD-10-CM | POA: Diagnosis not present

## 2015-02-11 DIAGNOSIS — R531 Weakness: Secondary | ICD-10-CM | POA: Diagnosis not present

## 2015-02-11 HISTORY — PX: TOTAL KNEE ARTHROPLASTY: SHX125

## 2015-02-11 LAB — TYPE AND SCREEN
ABO/RH(D): A POS
Antibody Screen: NEGATIVE

## 2015-02-11 SURGERY — ARTHROPLASTY, KNEE, TOTAL
Anesthesia: Spinal | Site: Knee | Laterality: Left

## 2015-02-11 MED ORDER — RIVAROXABAN 10 MG PO TABS
10.0000 mg | ORAL_TABLET | Freq: Every day | ORAL | Status: DC
  Administered 2015-02-12 – 2015-02-13 (×2): 10 mg via ORAL
  Filled 2015-02-11 (×3): qty 1

## 2015-02-11 MED ORDER — METOCLOPRAMIDE HCL 10 MG PO TABS: 5.0000 mg | ORAL_TABLET | Freq: Three times a day (TID) | ORAL | Status: DC | PRN

## 2015-02-11 MED ORDER — ATROPINE SULFATE 0.4 MG/ML IJ SOLN
INTRAMUSCULAR | Status: AC
  Filled 2015-02-11: qty 1

## 2015-02-11 MED ORDER — FENTANYL CITRATE 0.05 MG/ML IJ SOLN: 25.0000 ug | INTRAMUSCULAR | Status: DC | PRN

## 2015-02-11 MED ORDER — FUROSEMIDE 40 MG PO TABS
40.0000 mg | ORAL_TABLET | Freq: Two times a day (BID) | ORAL | Status: DC
  Administered 2015-02-11 – 2015-02-13 (×4): 40 mg via ORAL
  Filled 2015-02-11 (×6): qty 1

## 2015-02-11 MED ORDER — ACETAMINOPHEN 10 MG/ML IV SOLN
1000.0000 mg | Freq: Once | INTRAVENOUS | Status: AC
  Administered 2015-02-11: 1000 mg via INTRAVENOUS
  Filled 2015-02-11: qty 100

## 2015-02-11 MED ORDER — ONDANSETRON HCL 4 MG PO TABS: 4.0000 mg | ORAL_TABLET | Freq: Four times a day (QID) | ORAL | Status: DC | PRN

## 2015-02-11 MED ORDER — KETOROLAC TROMETHAMINE 30 MG/ML IJ SOLN
INTRAMUSCULAR | Status: AC
  Filled 2015-02-11: qty 1

## 2015-02-11 MED ORDER — PROPOFOL 10 MG/ML IV BOLUS
INTRAVENOUS | Status: AC
  Filled 2015-02-11: qty 20

## 2015-02-11 MED ORDER — HYDROMORPHONE HCL 2 MG PO TABS
2.0000 mg | ORAL_TABLET | ORAL | Status: DC | PRN
  Administered 2015-02-11 (×2): 4 mg via ORAL
  Administered 2015-02-11: 2 mg via ORAL
  Administered 2015-02-12 (×2): 4 mg via ORAL
  Administered 2015-02-12 (×2): 2 mg via ORAL
  Administered 2015-02-12: 4 mg via ORAL
  Administered 2015-02-13 (×4): 2 mg via ORAL
  Filled 2015-02-11: qty 1
  Filled 2015-02-11 (×5): qty 2
  Filled 2015-02-11 (×2): qty 1
  Filled 2015-02-11: qty 2
  Filled 2015-02-11: qty 1
  Filled 2015-02-11 (×2): qty 2
  Filled 2015-02-11: qty 1

## 2015-02-11 MED ORDER — METHYLPREDNISOLONE ACETATE 80 MG/ML IJ SUSP
INTRAMUSCULAR | Status: DC | PRN
  Administered 2015-02-11: 80 mg via INTRA_ARTICULAR

## 2015-02-11 MED ORDER — PREDNISONE 20 MG PO TABS
20.0000 mg | ORAL_TABLET | Freq: Once | ORAL | Status: AC
  Administered 2015-02-11: 20 mg via ORAL
  Filled 2015-02-11: qty 1

## 2015-02-11 MED ORDER — PROPOFOL 10 MG/ML IV BOLUS
INTRAVENOUS | Status: DC | PRN
  Administered 2015-02-11: 20 mg via INTRAVENOUS

## 2015-02-11 MED ORDER — BUPIVACAINE IN DEXTROSE 0.75-8.25 % IT SOLN
INTRATHECAL | Status: DC | PRN
  Administered 2015-02-11: 1.8 mL via INTRATHECAL

## 2015-02-11 MED ORDER — BUPIVACAINE LIPOSOME 1.3 % IJ SUSP
INTRAMUSCULAR | Status: DC | PRN
  Administered 2015-02-11: 20 mL

## 2015-02-11 MED ORDER — ACETAMINOPHEN 325 MG PO TABS: 650.0000 mg | ORAL_TABLET | Freq: Four times a day (QID) | ORAL | Status: DC | PRN

## 2015-02-11 MED ORDER — METOCLOPRAMIDE HCL 5 MG/ML IJ SOLN: 5.0000 mg | Freq: Three times a day (TID) | INTRAMUSCULAR | Status: DC | PRN

## 2015-02-11 MED ORDER — MORPHINE SULFATE 2 MG/ML IJ SOLN
1.0000 mg | INTRAMUSCULAR | Status: DC | PRN
  Administered 2015-02-11: 1 mg via INTRAVENOUS
  Filled 2015-02-11: qty 1

## 2015-02-11 MED ORDER — LOSARTAN POTASSIUM 50 MG PO TABS
100.0000 mg | ORAL_TABLET | Freq: Every day | ORAL | Status: DC
  Administered 2015-02-11 – 2015-02-12 (×2): 100 mg via ORAL
  Filled 2015-02-11 (×3): qty 2

## 2015-02-11 MED ORDER — CEFAZOLIN SODIUM-DEXTROSE 2-3 GM-% IV SOLR
2.0000 g | Freq: Four times a day (QID) | INTRAVENOUS | Status: AC
  Administered 2015-02-11 (×2): 2 g via INTRAVENOUS
  Filled 2015-02-11 (×2): qty 50

## 2015-02-11 MED ORDER — LACTATED RINGERS IV SOLN: INTRAVENOUS | Status: DC

## 2015-02-11 MED ORDER — PHENOL 1.4 % MT LIQD
1.0000 | OROMUCOSAL | Status: DC | PRN
  Filled 2015-02-11: qty 177

## 2015-02-11 MED ORDER — BUPIVACAINE LIPOSOME 1.3 % IJ SUSP
20.0000 mL | Freq: Once | INTRAMUSCULAR | Status: DC
  Filled 2015-02-11: qty 20

## 2015-02-11 MED ORDER — CEFAZOLIN SODIUM-DEXTROSE 2-3 GM-% IV SOLR
INTRAVENOUS | Status: AC
  Filled 2015-02-11: qty 50

## 2015-02-11 MED ORDER — ACETAMINOPHEN 650 MG RE SUPP: 650.0000 mg | Freq: Four times a day (QID) | RECTAL | Status: DC | PRN

## 2015-02-11 MED ORDER — SODIUM CHLORIDE 0.9 % IV SOLN
INTRAVENOUS | Status: DC
  Administered 2015-02-11: 17:00:00 via INTRAVENOUS

## 2015-02-11 MED ORDER — LORATADINE 10 MG PO TABS
10.0000 mg | ORAL_TABLET | Freq: Every day | ORAL | Status: DC
  Administered 2015-02-11 – 2015-02-13 (×3): 10 mg via ORAL
  Filled 2015-02-11 (×3): qty 1

## 2015-02-11 MED ORDER — ZOLPIDEM TARTRATE 5 MG PO TABS: 5.0000 mg | ORAL_TABLET | Freq: Every evening | ORAL | Status: DC | PRN

## 2015-02-11 MED ORDER — POLYETHYLENE GLYCOL 3350 17 G PO PACK: 17.0000 g | PACK | Freq: Every day | ORAL | Status: DC | PRN

## 2015-02-11 MED ORDER — BISACODYL 10 MG RE SUPP: 10.0000 mg | Freq: Every day | RECTAL | Status: DC | PRN

## 2015-02-11 MED ORDER — SODIUM CHLORIDE 0.9 % IJ SOLN
INTRAMUSCULAR | Status: AC
  Filled 2015-02-11: qty 50

## 2015-02-11 MED ORDER — ACETAMINOPHEN 500 MG PO TABS
1000.0000 mg | ORAL_TABLET | Freq: Four times a day (QID) | ORAL | Status: AC
Start: 2015-02-11 — End: 2015-02-12
  Administered 2015-02-11 – 2015-02-12 (×4): 1000 mg via ORAL
  Filled 2015-02-11 (×4): qty 2

## 2015-02-11 MED ORDER — ETOMIDATE 2 MG/ML IV SOLN
INTRAVENOUS | Status: AC
  Filled 2015-02-11: qty 10

## 2015-02-11 MED ORDER — PROPOFOL INFUSION 10 MG/ML OPTIME
INTRAVENOUS | Status: DC | PRN
  Administered 2015-02-11: 100 ug/kg/min via INTRAVENOUS

## 2015-02-11 MED ORDER — FLEET ENEMA 7-19 GM/118ML RE ENEM: 1.0000 | ENEMA | Freq: Once | RECTAL | Status: AC | PRN

## 2015-02-11 MED ORDER — SODIUM CHLORIDE 0.9 % IR SOLN
Status: DC | PRN
  Administered 2015-02-11: 1000 mL

## 2015-02-11 MED ORDER — PREDNISONE 5 MG PO TABS
5.0000 mg | ORAL_TABLET | Freq: Two times a day (BID) | ORAL | Status: DC
  Administered 2015-02-13: 5 mg via ORAL
  Filled 2015-02-11 (×2): qty 1

## 2015-02-11 MED ORDER — PANTOPRAZOLE SODIUM 40 MG PO TBEC
40.0000 mg | DELAYED_RELEASE_TABLET | Freq: Two times a day (BID) | ORAL | Status: DC
  Administered 2015-02-11 – 2015-02-13 (×4): 40 mg via ORAL
  Filled 2015-02-11 (×5): qty 1

## 2015-02-11 MED ORDER — DEXAMETHASONE SODIUM PHOSPHATE 10 MG/ML IJ SOLN
10.0000 mg | Freq: Once | INTRAMUSCULAR | Status: AC
  Administered 2015-02-12: 10 mg via INTRAVENOUS
  Filled 2015-02-11: qty 1

## 2015-02-11 MED ORDER — BUPIVACAINE HCL (PF) 0.25 % IJ SOLN
INTRAMUSCULAR | Status: AC
  Filled 2015-02-11: qty 30

## 2015-02-11 MED ORDER — SODIUM CHLORIDE 0.9 % IJ SOLN
INTRAMUSCULAR | Status: DC | PRN
  Administered 2015-02-11: 30 mL

## 2015-02-11 MED ORDER — METHOCARBAMOL 1000 MG/10ML IJ SOLN
500.0000 mg | Freq: Four times a day (QID) | INTRAVENOUS | Status: DC | PRN
  Administered 2015-02-11: 500 mg via INTRAVENOUS
  Filled 2015-02-11 (×2): qty 5

## 2015-02-11 MED ORDER — DIPHENHYDRAMINE HCL 12.5 MG/5ML PO ELIX: 12.5000 mg | ORAL_SOLUTION | ORAL | Status: DC | PRN

## 2015-02-11 MED ORDER — BUPIVACAINE HCL 0.25 % IJ SOLN
INTRAMUSCULAR | Status: DC | PRN
  Administered 2015-02-11: 20 mL

## 2015-02-11 MED ORDER — AZELASTINE HCL 0.1 % NA SOLN
2.0000 | Freq: Two times a day (BID) | NASAL | Status: DC | PRN
  Filled 2015-02-11: qty 30

## 2015-02-11 MED ORDER — SODIUM CHLORIDE 0.9 % IV SOLN: INTRAVENOUS | Status: DC

## 2015-02-11 MED ORDER — PREDNISONE 10 MG PO TABS
10.0000 mg | ORAL_TABLET | Freq: Two times a day (BID) | ORAL | Status: AC
  Administered 2015-02-12 (×2): 10 mg via ORAL
  Filled 2015-02-11 (×2): qty 1

## 2015-02-11 MED ORDER — ROCURONIUM BROMIDE 100 MG/10ML IV SOLN
INTRAVENOUS | Status: AC
  Filled 2015-02-11: qty 3

## 2015-02-11 MED ORDER — MIDAZOLAM HCL 2 MG/2ML IJ SOLN
INTRAMUSCULAR | Status: AC
  Filled 2015-02-11: qty 2

## 2015-02-11 MED ORDER — DEXAMETHASONE SODIUM PHOSPHATE 10 MG/ML IJ SOLN
10.0000 mg | Freq: Once | INTRAMUSCULAR | Status: AC
  Administered 2015-02-11: 10 mg via INTRAVENOUS

## 2015-02-11 MED ORDER — FENTANYL CITRATE 0.05 MG/ML IJ SOLN
INTRAMUSCULAR | Status: AC
  Filled 2015-02-11: qty 2

## 2015-02-11 MED ORDER — MEPERIDINE HCL 50 MG/ML IJ SOLN: 6.2500 mg | INTRAMUSCULAR | Status: DC | PRN

## 2015-02-11 MED ORDER — TRANEXAMIC ACID 100 MG/ML IV SOLN
1000.0000 mg | INTRAVENOUS | Status: AC
  Administered 2015-02-11: 1000 mg via INTRAVENOUS
  Filled 2015-02-11: qty 10

## 2015-02-11 MED ORDER — ALBUTEROL SULFATE (2.5 MG/3ML) 0.083% IN NEBU: 3.0000 mL | INHALATION_SOLUTION | Freq: Four times a day (QID) | RESPIRATORY_TRACT | Status: DC | PRN

## 2015-02-11 MED ORDER — CEFAZOLIN SODIUM-DEXTROSE 2-3 GM-% IV SOLR
2.0000 g | INTRAVENOUS | Status: AC
  Administered 2015-02-11: 2 g via INTRAVENOUS

## 2015-02-11 MED ORDER — DOCUSATE SODIUM 100 MG PO CAPS
100.0000 mg | ORAL_CAPSULE | Freq: Two times a day (BID) | ORAL | Status: DC
  Administered 2015-02-11 – 2015-02-13 (×5): 100 mg via ORAL

## 2015-02-11 MED ORDER — LACTATED RINGERS IV SOLN
INTRAVENOUS | Status: DC | PRN
  Administered 2015-02-11 (×3): via INTRAVENOUS

## 2015-02-11 MED ORDER — METHOCARBAMOL 500 MG PO TABS
500.0000 mg | ORAL_TABLET | Freq: Four times a day (QID) | ORAL | Status: DC | PRN
  Administered 2015-02-11 – 2015-02-13 (×5): 500 mg via ORAL
  Filled 2015-02-11 (×6): qty 1

## 2015-02-11 MED ORDER — ZOLPIDEM TARTRATE 10 MG PO TABS: 10.0000 mg | ORAL_TABLET | Freq: Every evening | ORAL | Status: DC | PRN

## 2015-02-11 MED ORDER — ONDANSETRON HCL 4 MG/2ML IJ SOLN
4.0000 mg | Freq: Four times a day (QID) | INTRAMUSCULAR | Status: DC | PRN
Start: 2015-02-11 — End: 2015-02-13

## 2015-02-11 MED ORDER — PROMETHAZINE HCL 25 MG/ML IJ SOLN: 6.2500 mg | INTRAMUSCULAR | Status: DC | PRN

## 2015-02-11 MED ORDER — FENTANYL CITRATE 0.05 MG/ML IJ SOLN
INTRAMUSCULAR | Status: DC | PRN
  Administered 2015-02-11: 100 ug via INTRAVENOUS

## 2015-02-11 MED ORDER — MENTHOL 3 MG MT LOZG: 1.0000 | LOZENGE | OROMUCOSAL | Status: DC | PRN

## 2015-02-11 MED ORDER — PREDNISONE 5 MG PO TABS
5.0000 mg | ORAL_TABLET | Freq: Every day | ORAL | Status: DC
  Filled 2015-02-11: qty 1

## 2015-02-11 MED ORDER — METHYLPREDNISOLONE ACETATE 40 MG/ML IJ SUSP
INTRAMUSCULAR | Status: AC
  Filled 2015-02-11: qty 2

## 2015-02-11 MED ORDER — MIDAZOLAM HCL 5 MG/5ML IJ SOLN
INTRAMUSCULAR | Status: DC | PRN
  Administered 2015-02-11: 2 mg via INTRAVENOUS

## 2015-02-11 MED ORDER — 0.9 % SODIUM CHLORIDE (POUR BTL) OPTIME
TOPICAL | Status: DC | PRN
  Administered 2015-02-11: 1000 mL

## 2015-02-11 MED ORDER — DEXAMETHASONE SODIUM PHOSPHATE 10 MG/ML IJ SOLN
INTRAMUSCULAR | Status: AC
  Filled 2015-02-11: qty 1

## 2015-02-11 SURGICAL SUPPLY — 60 items
BAG DECANTER FOR FLEXI CONT (MISCELLANEOUS) ×2 IMPLANT
BAG ZIPLOCK 12X15 (MISCELLANEOUS) ×2 IMPLANT
BANDAGE ELASTIC 6 VELCRO ST LF (GAUZE/BANDAGES/DRESSINGS) ×2 IMPLANT
BANDAGE ESMARK 6X9 LF (GAUZE/BANDAGES/DRESSINGS) ×1 IMPLANT
BLADE SAG 18X100X1.27 (BLADE) ×2 IMPLANT
BLADE SAW SGTL 11.0X1.19X90.0M (BLADE) ×2 IMPLANT
BNDG ESMARK 6X9 LF (GAUZE/BANDAGES/DRESSINGS) ×2
BOWL SMART MIX CTS (DISPOSABLE) ×2 IMPLANT
CAPT KNEE TOTAL 3 ATTUNE ×2 IMPLANT
CEMENT HV SMART SET (Cement) ×4 IMPLANT
CUFF TOURN SGL QUICK 34 (TOURNIQUET CUFF) ×1
CUFF TRNQT CYL 34X4X40X1 (TOURNIQUET CUFF) ×1 IMPLANT
DECANTER SPIKE VIAL GLASS SM (MISCELLANEOUS) ×2 IMPLANT
DRAPE EXTREMITY T 121X128X90 (DRAPE) ×2 IMPLANT
DRAPE POUCH INSTRU U-SHP 10X18 (DRAPES) ×2 IMPLANT
DRAPE U-SHAPE 47X51 STRL (DRAPES) ×2 IMPLANT
DRSG ADAPTIC 3X8 NADH LF (GAUZE/BANDAGES/DRESSINGS) ×2 IMPLANT
DRSG PAD ABDOMINAL 8X10 ST (GAUZE/BANDAGES/DRESSINGS) ×2 IMPLANT
DURAPREP 26ML APPLICATOR (WOUND CARE) ×2 IMPLANT
ELECT REM PT RETURN 9FT ADLT (ELECTROSURGICAL) ×2
ELECTRODE REM PT RTRN 9FT ADLT (ELECTROSURGICAL) ×1 IMPLANT
EVACUATOR 1/8 PVC DRAIN (DRAIN) ×2 IMPLANT
FACESHIELD WRAPAROUND (MASK) ×10 IMPLANT
GAUZE SPONGE 4X4 12PLY STRL (GAUZE/BANDAGES/DRESSINGS) ×2 IMPLANT
GLOVE BIO SURGEON STRL SZ7.5 (GLOVE) IMPLANT
GLOVE BIO SURGEON STRL SZ8 (GLOVE) ×2 IMPLANT
GLOVE BIOGEL PI IND STRL 6.5 (GLOVE) IMPLANT
GLOVE BIOGEL PI IND STRL 8 (GLOVE) ×1 IMPLANT
GLOVE BIOGEL PI INDICATOR 6.5 (GLOVE)
GLOVE BIOGEL PI INDICATOR 8 (GLOVE) ×1
GLOVE SURG SS PI 6.5 STRL IVOR (GLOVE) IMPLANT
GOWN STRL REUS W/TWL LRG LVL3 (GOWN DISPOSABLE) ×2 IMPLANT
GOWN STRL REUS W/TWL XL LVL3 (GOWN DISPOSABLE) IMPLANT
HANDPIECE INTERPULSE COAX TIP (DISPOSABLE) ×1
IMMOBILIZER KNEE 20 (SOFTGOODS) ×4 IMPLANT
IMMOBILIZER KNEE 20 THIGH 36 (SOFTGOODS) ×1 IMPLANT
KIT BASIN OR (CUSTOM PROCEDURE TRAY) ×2 IMPLANT
MANIFOLD NEPTUNE II (INSTRUMENTS) ×2 IMPLANT
NDL SAFETY ECLIPSE 18X1.5 (NEEDLE) ×2 IMPLANT
NEEDLE HYPO 18GX1.5 SHARP (NEEDLE) ×2
NS IRRIG 1000ML POUR BTL (IV SOLUTION) ×2 IMPLANT
PACK TOTAL JOINT (CUSTOM PROCEDURE TRAY) ×2 IMPLANT
PADDING CAST COTTON 6X4 STRL (CAST SUPPLIES) ×2 IMPLANT
PEN SKIN MARKING BROAD (MISCELLANEOUS) ×2 IMPLANT
POSITIONER SURGICAL ARM (MISCELLANEOUS) ×2 IMPLANT
SET HNDPC FAN SPRY TIP SCT (DISPOSABLE) ×1 IMPLANT
STRIP CLOSURE SKIN 1/2X4 (GAUZE/BANDAGES/DRESSINGS) ×4 IMPLANT
SUCTION FRAZIER 12FR DISP (SUCTIONS) ×2 IMPLANT
SUT MNCRL AB 4-0 PS2 18 (SUTURE) ×2 IMPLANT
SUT VIC AB 2-0 CT1 27 (SUTURE) ×3
SUT VIC AB 2-0 CT1 TAPERPNT 27 (SUTURE) ×3 IMPLANT
SUT VLOC 180 0 24IN GS25 (SUTURE) ×2 IMPLANT
SYR 20CC LL (SYRINGE) ×2 IMPLANT
SYR 50ML LL SCALE MARK (SYRINGE) ×2 IMPLANT
TOWEL OR 17X26 10 PK STRL BLUE (TOWEL DISPOSABLE) ×2 IMPLANT
TOWEL OR NON WOVEN STRL DISP B (DISPOSABLE) ×2 IMPLANT
TRAY FOLEY CATH 14FRSI W/METER (CATHETERS) ×2 IMPLANT
WATER STERILE IRR 1500ML POUR (IV SOLUTION) ×2 IMPLANT
WRAP KNEE MAXI GEL POST OP (GAUZE/BANDAGES/DRESSINGS) ×2 IMPLANT
YANKAUER SUCT BULB TIP 10FT TU (MISCELLANEOUS) ×2 IMPLANT

## 2015-02-11 NOTE — Plan of Care (Signed)
Problem: Phase I Progression Outcomes Goal: Initial discharge plan identified Outcome: Completed/Met Date Met:  02/11/15 Pt states that she would like to go to [facility] for rehab at discharge.

## 2015-02-11 NOTE — Anesthesia Postprocedure Evaluation (Signed)
  Anesthesia Post-op Note  Patient: [name]  Procedure(s) Performed: Procedure(s) (LRB): LEFT TOTAL KNEE ARTHROPLASTY, RIGHT KNEE CORTISONE INJECTION (Left)  Patient Location: PACU  Anesthesia Type: Spinal  Level of Consciousness: awake and alert   Airway and Oxygen Therapy: Patient Spontanous Breathing  Post-op Pain: mild  Post-op Assessment: Post-op Vital signs reviewed, Patient's Cardiovascular Status Stable, Respiratory Function Stable, Patent Airway and No signs of Nausea or vomiting  Last Vitals:  Filed Vitals:   02/11/15 1237  BP: 131/58  Pulse: 68  Temp: 36.4 C  Resp: 18    Post-op Vital Signs: stable   Complications: No apparent anesthesia complications

## 2015-02-11 NOTE — Interval H&P Note (Signed)
History and Physical Interval Note:  02/11/2015 7:25 AM  [name]  has presented today for surgery, with the diagnosis of left knee osteoarthritis  The various methods of treatment have been discussed with the patient and family. After consideration of risks, benefits and other options for treatment, the patient has consented to  Procedure(s): LEFT TOTAL KNEE ARTHROPLASTY (Left) as a surgical intervention .  The patient's history has been reviewed, patient examined, no change in status, stable for surgery.  I have reviewed the patient's chart and labs.  Questions were answered to the patient's satisfaction.     Gearlean Alf

## 2015-02-11 NOTE — H&P (View-Only) (Signed)
[name]. Regal DOB: 25-Dec-1945 Married / Language: English / Race: White Female Date of Admission:  02/11/2015 CC:  Bilateral Knee Pain, Left greater than Right History of Present Illness ([name] L. Linden Mikes III PA-C; 01/22/2015 2:32 PM) The patient is a 69 year old female who comes in for a preoperative History and Physical. The patient is scheduled for a left total knee arthroplasty to be performed by Dr. Dione Plover. Aluisio, MD at [facility] on 02-11-2015. The patient reports left knee (worse than right) symptoms including: pain, instability, locking, catching, giving way and weakness which began 2 month(s) ago without any known injury (they have bothered her intermittently since she was in her 13s).The patient feels that the symptoms are worsening. Prior to being seen today the patient was previously evaluated by a colleague. Past treatment for this problem has included application of ice and non-opioid analgesics (Tylenol). She is a retired Marketing executive. She has never had any major injuries to the knees. She has had viscosupplementation in the past and has also had cortisone injections with no relief. She had significant pain with the last cortisone injection and absolutely does not want another one. She is having pain with activity and rest. She is unable to go grocery shopping without significant pain. She even has pain that wakes her up at night. She is not eager to have surgery done but is at a point where she feels that it her only option. She states that the left knee is worse than the right but they both hurt. She says they hurt at all times. It limits what she can and cannot do. Injections have not provided any benefit. She is at a stage now where she is here for evaluation and consideration of surgical treatment. They have been treated conservatively in the past for the above stated problem and despite conservative measures, they continue to have progressive pain and  severe functional limitations and dysfunction. They have failed non-operative management including home exercise, medications, and injections. It is felt that they would benefit from undergoing total joint replacement. Risks and benefits of the procedure have been discussed with the patient and they elect to proceed with surgery. There are no active contraindications to surgery such as ongoing infection or rapidly progressive neurological disease.   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) Pulmonary fibrosis (J84.10) Rheumatoid Arthritis Osteoarthritis Depression Congestive Heart Failure High blood pressure Gastroesophageal Reflux Disease Chronic Obstructive Lung Disease Menopause Pulmonary Fibrosis Scleroderma  Allergies Codeine and Related sensitivity  Family History ([name] [name], III PA-C; 01/22/2015 2:25 PM) Diabetes Mellitus mother Depression mother Heart Disease mother and father Drug / Alcohol Addiction father Cerebrovascular Accident mother Heart disease in female family member before age 2 Osteoarthritis mother, father, sister and brother Hypertension mother Rheumatoid Arthritis father and brother  Social History ([name] [name], III PA-C; 01/22/2015 2:48 PM) Exercise Exercises weekly; does running / walking Drug/Alcohol Rehab (Previously) no Living situation live with spouse Illicit drug use no Drug/Alcohol Rehab (Currently) no Alcohol use current drinker; drinks wine; only occasionally per week Current work status retired Children 3 Marital status married Pain Contract no Number of flights of stairs before winded 1 Tobacco use former smoker Tobacco / smoke exposure no Advance Directives Living Will, Healthcare POA  Medication History ([name] L Calisa Luckenbaugh, III PA-C; 01/27/2015 4:49  PM) Ambien (5MG Tablet, Oral) Active. Astelin (137MCG/SPRAY Solution, Nasal) Active. Humira (  Subcutaneous) Specific dose unknown - Active. Proventil HFA (108 (90 Base)MCG/ACT Aerosol Soln, Inhalation) Active. Aspirin EC (81MG Tablet DR, Oral) Active. Biotin (Oral) Specific dose unknown - Active. Hydroxychloroquine Sulfate (200MG Tablet, Oral) Active. Losartan Potassium (100MG Tablet, Oral) Active. PredniSONE (20MG Tablet, Oral) Active.  Past Surgical History ([name] [name], III PA-C; 01/22/2015 2:41 PM) Gallbladder Surgery Date: 2008. laporoscopic Carpal Tunnel Repair Date: 2004. right Tonsillectomy Hysterectomy Date: 69. partial (non-cancerous)   Review of Systems ([name] L. [name] III PA-C; 01/22/2015 2:42 PM) General Not Present- Chills, Fatigue, Fever, Memory Loss, Night Sweats, Weight Gain and Weight Loss. Skin Not Present- Eczema, Hives, Itching, Lesions and Rash. HEENT Not Present- Dentures, Double Vision, Headache, Hearing Loss, Tinnitus and Visual Loss. Respiratory Present- Cough and Shortness of breath with exertion. Not Present- Allergies, Chronic Cough, Coughing up blood and Shortness of breath at rest. Cardiovascular Present- Palpitations. Not Present- Chest Pain, Difficulty Breathing Lying Down, Murmur, Racing/skipping heartbeats and Swelling. Gastrointestinal Not Present- Abdominal Pain, Bloody Stool, Constipation, Diarrhea, Difficulty Swallowing, Heartburn, Jaundice, Loss of appetitie, Nausea and Vomiting. Female Genitourinary Not Present- Blood in Urine, Discharge, Flank Pain, Incontinence, Painful Urination, Urgency, Urinary frequency, Urinary Retention, Urinating at Night and Weak urinary stream. Musculoskeletal Present- Joint Pain (bilateral knees). Not Present- Back Pain, Joint Swelling, Morning Stiffness, Muscle Pain, Muscle Weakness and Spasms. Neurological Not Present- Blackout spells, Difficulty with balance, Dizziness, Paralysis, Tremor  and Weakness. Psychiatric Not Present- Insomnia.  Vitals ([name] L. Jasreet Dickie III PA-C; 01/22/2015 2:53 PM) 01/22/2015 2:51 PM Weight: 205 lb Height: 63in Weight was reported by patient. Height was reported by patient. Body Surface Area: 1.95 m Body Mass Index: 36.31 kg/m  BP: 124/68 (Sitting, Left Arm, Standard)  Physical Exam (Cornelia Walraven L. Leilani Cespedes III PA-C; 01/22/2015 2:55 PM) General Mental Status -Alert, cooperative and good historian. General Appearance-pleasant, Not in acute distress. Orientation-Oriented X3. Build & Nutrition-Well nourished and Well developed.  Head and Neck Head-normocephalic, atraumatic . Neck Global Assessment - supple, no bruit auscultated on the right, no bruit auscultated on the left.  Eye Vision-Wears corrective lenses. Pupil - Bilateral-Regular and Round. Motion - Bilateral-EOMI.  Chest and Lung Exam Auscultation Breath sounds - clear at anterior chest wall and clear at posterior chest wall. Adventitious sounds - No Adventitious sounds.  Cardiovascular Auscultation Rhythm - Regular rate and rhythm. Heart Sounds - S1 WNL and S2 WNL. Murmurs & Other Heart Sounds - Auscultation of the heart reveals - No Murmurs.  Abdomen Inspection Contour - Generalized mild distention. Palpation/Percussion Tenderness - Abdomen is non-tender to palpation. Rigidity (guarding) - Abdomen is soft. Auscultation Auscultation of the abdomen reveals - Bowel sounds normal.  Female Genitourinary Note: Not done, not pertinent to present illness   Musculoskeletal Note: On exam, she is alert and oriented in no apparent distress. Evaluation of her hips show normal range of motion with no discomfort. Her left knee shows no effusion. Range of motion is about 5-100. There is marked crepitus on range of motion. She is tender medial greater than lateral. There is no instability. Her right knee shows no effusion. Range of motion is about  5-115. Moderate crepitus on range of motion. Tender medial greater than lateral with no instability noted. Pulse, sensation, and motor intact both lower extremities.  RADIOGRAPHS: AP both knees and lateral show severe arthritic change medial and patellofemoral both knees, slightly worse on the left than the right.   Assessment & Plan (Danielle Lento L. Deontez Klinke III PA-C; 01/27/2015 4:35 PM) Osteoarthritis of left knee (M17.9) Osteoarthritis of  right knee Note:Surgical Plans: Left Total Knee Replacement and a Right Knee Cortisone Injection  Disposition: Wants to look into [facility]  PCP: Dr. Willey Blade - Patient has been seen preoperatively and felt to be stable for surgery.  IV TXA  Anesthesia Issues: NONE  Signed electronically by Joelene Millin, III PA-C

## 2015-02-11 NOTE — Transfer of Care (Signed)
Immediate Anesthesia Transfer of Care Note  Patient: [name]  Procedure(s) Performed: Procedure(s): LEFT TOTAL KNEE ARTHROPLASTY, RIGHT KNEE CORTISONE INJECTION (Left)  Patient Location: PACU  Anesthesia Type:Spinal  Level of Consciousness: awake, sedated and responds to stimulation  Airway & Oxygen Therapy: Patient Spontanous Breathing and Patient connected to face mask oxygen  Post-op Assessment: Report given to RN, Post -op Vital signs reviewed and stable and SAB level L1.  Post vital signs: Reviewed and stable  Last Vitals:  Filed Vitals:   02/11/15 0704  BP: 146/71  Pulse: 80  Temp: 36.6 C  Resp: 18    Complications: No apparent anesthesia complications

## 2015-02-11 NOTE — Anesthesia Procedure Notes (Signed)
Spinal Patient location during procedure: OR End time: 02/11/2015 9:17 AM Staffing Resident/CRNA: Enrigue Catena E Preanesthetic Checklist Completed: patient identified, site marked, surgical consent, pre-op evaluation, timeout performed, IV checked, risks and benefits discussed and monitors and equipment checked Spinal Block Patient position: sitting Prep: Betadine Patient monitoring: heart rate, continuous pulse ox and blood pressure Approach: right paramedian Location: L3-4 Injection technique: single-shot Needle Needle type: Sprotte  Needle gauge: 24 G Assessment Sensory level: T6 Additional Notes Pt tolerated spinal well.  CSF x 3, negative heme  Or paresthesia.  Spinal kit and drugs within  Date.

## 2015-02-11 NOTE — Progress Notes (Signed)
Utilization review completed.

## 2015-02-11 NOTE — Op Note (Addendum)
Pre-operative diagnosis- Osteoarthritis  Bilateral knee(s)  Post-operative diagnosis- Osteoarthritis Bilateral knee(s)  Procedure-  Left  Total Knee Arthroplasty   Right knee cortisone injection  Surgeon- Dione Plover. Claborn Janusz, MD  Assistant- Ardeen Jourdain, PA-C   Anesthesia-  Spinal  EBL-* No blood loss amount entered *   Drains Hemovac  Tourniquet time-  Total Tourniquet Time Documented: Thigh (Left) - 37 minutes Total: Thigh (Left) - 37 minutes     Complications- None  Condition-PACU - hemodynamically stable.   Brief Clinical Note  [name] is a 69 y.o. year old female with end stage OA of her left knee with progressively worsening pain and dysfunction. She has constant pain, with activity and at rest and significant functional deficits with difficulties even with ADLs. She has had extensive non-op management including analgesics, injections of cortisone and viscosupplements, and home exercise program, but remains in significant pain with significant dysfunction. Radiographs show bone on bone arthritis medial and patellofemoral. She presents now for left Total Knee Arthroplasty.    Procedure in detail---   The patient is brought into the operating room and positioned supine on the operating table. After successful administration of  Spinal,   a tourniquet is placed high on the  Left thigh(s) and the lower extremity is prepped and draped in the usual sterile fashion. Time out is performed by the operating team and then the  Left lower extremity is wrapped in Esmarch, knee flexed and the tourniquet inflated to 300 mmHg.       A midline incision is made with a ten blade through the subcutaneous tissue to the level of the extensor mechanism. A fresh blade is used to make a medial parapatellar arthrotomy. Soft tissue over the proximal medial tibia is subperiosteally elevated to the joint line with a knife and into the semimembranosus bursa with a Cobb elevator. Soft tissue over  the proximal lateral tibia is elevated with attention being paid to avoiding the patellar tendon on the tibial tubercle. The patella is everted, knee flexed 90 degrees and the ACL and PCL are removed. Findings are bone on bone medial and patellofemoral with large medial osteophytes.        The drill is used to create a starting hole in the distal femur and the canal is thoroughly irrigated with sterile saline to remove the fatty contents. The 5 degree Left  valgus alignment guide is placed into the femoral canal and the distal femoral cutting block is pinned to remove 10 mm off the distal femur. Resection is made with an oscillating saw.      The tibia is subluxed forward and the menisci are removed. The extramedullary alignment guide is placed referencing proximally at the medial aspect of the tibial tubercle and distally along the second metatarsal axis and tibial crest. The block is pinned to remove 35m off the more deficient medial  side. Resection is made with an oscillating saw. Size 5is the most appropriate size for the tibia and the proximal tibia is prepared with the modular drill and keel punch for that size.      The femoral sizing guide is placed and size 6 is most appropriate. Rotation is marked off the epicondylar axis and confirmed by creating a rectangular flexion gap at 90 degrees. The size 6 cutting block is pinned in this rotation and the anterior, posterior and chamfer cuts are made with the oscillating saw. The intercondylar block is then placed and that cut is made.  Trial size 5 tibial component, trial size 6 posterior stabilized femur and a 6  mm posterior stabilized rotating platform insert trial is placed. Full extension is achieved with excellent varus/valgus and anterior/posterior balance throughout full range of motion. The patella is everted and thickness measured to be 22  mm. Free hand resection is taken to 12 mm, a 35 template is placed, lug holes are drilled, trial patella  is placed, and it tracks normally. Osteophytes are removed off the posterior femur with the trial in place. All trials are removed and the cut bone surfaces prepared with pulsatile lavage. Cement is mixed and once ready for implantation, the size 5 tibial implant, size  6 posterior stabilized femoral component, and the size 35 patella are cemented in place and the patella is held with the clamp. The trial insert is placed and the knee held in full extension. The Exparel (20 ml mixed with 30 ml saline) and .25% Bupivicaine, are injected into the extensor mechanism, posterior capsule, medial and lateral gutters and subcutaneous tissues.  All extruded cement is removed and once the cement is hard the permanent 6 mm posterior stabilized rotating platform insert is placed into the tibial tray.      The wound is copiously irrigated with saline solution and the extensor mechanism closed over a hemovac drain with #1 V-loc suture. The tourniquet is released for a total tourniquet time of 37  minutes. Flexion against gravity is 135 degrees and the patella tracks normally. Subcutaneous tissue is closed with 2.0 vicryl and subcuticular with running 4.0 Monocryl. The incision is cleaned and dried and steri-strips and a bulky sterile dressing are applied.        I then prepped the right knee with Betadine and injected with 80 mg Depomedrol without problems.The limb is placed into a knee immobilizer and the patient is awakened and transported to recovery in stable condition.      Please note that a surgical assistant was a medical necessity for this procedure in order to perform it in a safe and expeditious manner. Surgical assistant was necessary to retract the ligaments and vital neurovascular structures to prevent injury to them and also necessary for proper positioning of the limb to allow for anatomic placement of the prosthesis.   Dione Plover Aino Heckert, MD    02/11/2015, 10:21 AM

## 2015-02-11 NOTE — Anesthesia Preprocedure Evaluation (Signed)
Anesthesia Evaluation  Patient identified by MRN, date of birth, ID band Patient awake    Reviewed: Allergy & Precautions, NPO status , Patient's Chart, lab work & pertinent test results  Airway Mallampati: II  TM Distance: >3 FB Neck ROM: Full    Dental no notable dental hx.    Pulmonary shortness of breath, former smoker,  Pulmonary fibrosis breath sounds clear to auscultation  Pulmonary exam normal       Cardiovascular hypertension, +CHF Rhythm:Regular Rate:Normal     Neuro/Psych negative neurological ROS  negative psych ROS   GI/Hepatic negative GI ROS, Neg liver ROS,   Endo/Other  negative endocrine ROS  Renal/GU negative Renal ROS  negative genitourinary   Musculoskeletal  (+) Arthritis -, Rheumatoid disorders,  scleroderma   Abdominal   Peds negative pediatric ROS (+)  Hematology negative hematology ROS (+)   Anesthesia Other Findings   Reproductive/Obstetrics negative OB ROS                             Anesthesia Physical Anesthesia Plan  ASA: III  Anesthesia Plan: Spinal   Post-op Pain Management:    Induction:   Airway Management Planned: Simple Face Mask  Additional Equipment:   Intra-op Plan:   Post-operative Plan:   Informed Consent: I have reviewed the patients History and Physical, chart, labs and discussed the procedure including the risks, benefits and alternatives for the proposed anesthesia with the patient or authorized representative who has indicated his/her understanding and acceptance.   Dental advisory given  Plan Discussed with: CRNA  Anesthesia Plan Comments:         Anesthesia Quick Evaluation

## 2015-02-12 LAB — BASIC METABOLIC PANEL
Anion gap: 7 (ref 5–15)
BUN: 12 mg/dL (ref 6–23)
CALCIUM: 8.5 mg/dL (ref 8.4–10.5)
CO2: 28 mmol/L (ref 19–32)
CREATININE: 0.68 mg/dL (ref 0.50–1.10)
Chloride: 104 mmol/L (ref 96–112)
GFR calc Af Amer: >90 mL/min (ref >90)
GFR calc non Af Amer: 89 mL/min — ABNORMAL LOW (ref >90)
GLUCOSE: 153 mg/dL — AB (ref 70–99)
Potassium: 3.7 mmol/L (ref 3.5–5.1)
Sodium: 139 mmol/L (ref 135–145)

## 2015-02-12 LAB — CBC
HCT: 38.5 % (ref 36.0–46.0)
Hemoglobin: 12.7 g/dL (ref 12.0–15.0)
MCH: 31.5 pg (ref 26.0–34.0)
MCHC: 33 g/dL (ref 30.0–36.0)
MCV: 95.5 fL (ref 78.0–100.0)
Platelets: 218 10*3/uL (ref 150–400)
RBC: 4.03 MIL/uL (ref 3.87–5.11)
RDW: 12.3 % (ref 11.5–15.5)
WBC: 14.8 10*3/uL — ABNORMAL HIGH (ref 4.0–10.5)

## 2015-02-12 NOTE — Progress Notes (Addendum)
Clinical Social Work Department BRIEF PSYCHOSOCIAL ASSESSMENT 02/12/2015  Patient:  [name], [name]     Account Number:  [id number]     Admit date:  02/11/2015  Clinical Social Worker:  Lacie Scotts  Date/Time:  02/12/2015 04:09 PM  Referred by:  Physician  Date Referred:  02/12/2015 Referred for  SNF Placement   Other Referral:   Interview type:  Patient Other interview type:    PSYCHOSOCIAL DATA Living Status:  HUSBAND Admitted from facility:   Level of care:   Primary support name:  [name] Primary support relationship to patient:  SPOUSE Degree of support available:   unclear    CURRENT CONCERNS Current Concerns  Post-Acute Placement   Other Concerns:    SOCIAL WORK ASSESSMENT / PLAN Pt is a 69 yr old female living at home prior to hospitalization. CSW met with pt to assist with d/c planning. This is a planned admission. Pt requested ST Rehab at [facility] following hospital d/c. CSW has contacted [facility] and bed offer received. CSW will continue to follow to assist with d/c planning to SNF.   Assessment/plan status:  Psychosocial Support/Ongoing Assessment of Needs Other assessment/ plan:   Information/referral to community resources:   Insurance coverage for SNF and ambulance transport reviewed. Insurance authorization has been requested from [place].    PATIENT'S/FAMILY'S RESPONSE TO PLAN OF CARE: Pt's mood is bright. " I would prefer rehab at [facility] even though it's further away from home. " Pt very pleased [facility] able to offer a pvt room. " My MD wasn't happy with the facility in [place] I was going to go to for rehab.    Werner Lean LCSW [phone] "

## 2015-02-12 NOTE — Care Management Note (Signed)
Page 1 of 1   02/12/2015     2:01:41 PM CARE MANAGEMENT NOTE 02/12/2015  Patient:  [name], [name]   Account Number:  [id number]  Date Initiated:  02/12/2015  Documentation initiated by:  [facility]  Subjective/Objective Assessment:   adm: LEFT TOTAL KNEE ARTHROPLASTY, RIGHT KNEE CORTISONE INJECTION (Left)     Action/Plan:   discharge planning   Anticipated DC Date:  02/13/2015   Anticipated DC Plan:  [facility]  CM consult      Choice offered to / List presented to:             Status of service:  Completed, signed off Medicare Important Message given?   (If response is "NO", the following Medicare IM given date fields will be blank) Date Medicare IM given:   Medicare IM given by:   Date Additional Medicare IM given:   Additional Medicare IM given by:    Discharge Disposition:  [facility]  Per UR Regulation:    If discussed at Long Length of Stay Meetings, dates discussed:    Comments:  02/11/14 09:50 CM met with pt to confirm post op plan is SNF and pt confirms; CSW arranging.  No other CM needs were communicated.  Mariane Masters, BSN, CM [phone].

## 2015-02-12 NOTE — Progress Notes (Signed)
Subjective: 1 Day Post-Op Procedure(s) (LRB): LEFT TOTAL KNEE ARTHROPLASTY, RIGHT KNEE CORTISONE INJECTION (Left) Patient reports pain as mild.   Patient seen in rounds with Dr. Wynelle Link.  Rough night with no sleep. Patient is well, but has had some minor complaints of pain in the knee, requiring pain medications We will start therapy today.  Plan is to go [place] after hospital stay.  Objective: Vital signs in last 24 hours: Temp:  [97.5 F (36.4 C)-98.7 F (37.1 C)] 97.8 F (36.6 C) (03/15 0536) Pulse Rate:  [55-73] 67 (03/15 0536) Resp:  [16-23] 16 (03/15 0536) BP: (99-136)/(48-75) 124/50 mmHg (03/15 0536) SpO2:  [97 %-100 %] 98 % (03/15 0536) FiO2 (%):  [2 %] 2 % (03/14 1330) Weight:  [95.709 kg (211 lb)] 95.709 kg (211 lb) (03/14 1237)  Intake/Output from previous day:  Intake/Output Summary (Last 24 hours) at 02/12/15 0808 Last data filed at 02/12/15 0700  Gross per 24 hour  Intake 3501.5 ml  Output   2820 ml  Net  681.5 ml    Intake/Output this shift: UOP 450 since around [place]  Labs:  Recent Labs  02/12/15 0435  HGB 12.7    Recent Labs  02/12/15 0435  WBC 14.8*  RBC 4.03  HCT 38.5  PLT 218    Recent Labs  02/12/15 0435  NA 139  K 3.7  CL 104  CO2 28  BUN 12  CREATININE 0.68  GLUCOSE 153*  CALCIUM 8.5   No results for input(s): LABPT, INR in the last 72 hours.  EXAM General - Patient is Alert, Appropriate and Oriented Extremity - Neurovascular intact Sensation intact distally Dorsiflexion/Plantar flexion intact Dressing - dressing C/D/I Motor Function - intact, moving foot and toes well on exam.  Hemovac pulled without difficulty.  Past Medical History  Diagnosis Date  . Systemic sclerosis   . Osteoarthritis   . Seasonal allergies   . Interstitial lung disease   . Hx of cholecystectomy   . Rheumatoid arthritis(714.0)   . Raynaud disease   . Hypertension   . Pulmonary fibrosis   . Diastolic congestive heart  failure     02/05/15  states no fluid in extremities, breathing same as has been  . Shortness of breath dyspnea     states normal for her  . GERD (gastroesophageal reflux disease)   . History of blood transfusion     Assessment/Plan: 1 Day Post-Op Procedure(s) (LRB): LEFT TOTAL KNEE ARTHROPLASTY, RIGHT KNEE CORTISONE INJECTION (Left) Principal Problem:   OA (osteoarthritis) of knee  Estimated body mass index is 37.39 kg/(m^2) as calculated from the following:   Height as of this encounter: 5' 3" (1.6 m).   Weight as of this encounter: 95.709 kg (211 lb). Advance diet Up with therapy Prednisone taper  DVT Prophylaxis - Xarelto Weight-Bearing as tolerated to left leg D/C O2 and Pulse OX and try on Room Air  [name], PA-C Orthopaedic Surgery 02/12/2015, 8:08 AM

## 2015-02-12 NOTE — Plan of Care (Signed)
Problem: Consults Goal: Diagnosis- Total Joint Replacement Outcome: Completed/Met Date Met:  02/12/15 Primary Total Knee LEFT

## 2015-02-12 NOTE — Evaluation (Signed)
Occupational Therapy Evaluation Patient Details Name: [name] MRN: [id number] DOB: 06/25/1946 Today's Date: 02/12/2015    History of Present Illness Pt is s/p L TKA and R cortizone injection   Clinical Impression   This 69 year old female was admitted for the above.  She will benefit from skilled OT to increase safety and independence with ADLs.  Pt currently needs mod A to max A, +2 for safety, for LB adls and transfers.  Goals in acute are for min A level.    Follow Up Recommendations  SNF    Equipment Recommendations  3 in 1 bedside comode    Recommendations for Other Services       Precautions / Restrictions Precautions Precautions: Knee Required Braces or Orthoses: Knee Immobilizer - Left Restrictions Weight Bearing Restrictions: No      Mobility Bed Mobility Overal bed mobility: Needs Assistance Bed Mobility: Supine to Sit     Supine to sit: Mod assist     General bed mobility comments: HOB raised, used rail and assist for   Transfers Overall transfer level: Needs assistance Equipment used: Rolling walker (2 wheeled) Transfers: Sit to/from [facility] Sit to Stand: Mod assist;+2 safety/equipment Stand pivot transfers: Mod assist;+2 safety/equipment       General transfer comment: assist to rise and stabilize. Cues for UE/LE placement    Balance                                            ADL Overall ADL's : Needs assistance/impaired             Lower Body Bathing: Moderate assistance;+2 for safety/equipment;Sit to/from stand       Lower Body Dressing: Maximal assistance;+2 for safety/equipment;Sit to/from stand   Toilet Transfer: Moderate assistance;+2 for safety/equipment (to recliner)             General ADL Comments: Pt is able to perform UB adls with set up.  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  Pt is unable to cross RLE under L at this time to use AE.  Would  benefit from reacher to wash LEs.     Vision     Perception     Praxis      Pertinent Vitals/Pain Pain Assessment: 0-10 Pain Score: 8  (with weight bearing) Pain Location: L knee Pain Descriptors / Indicators: Aching Pain Intervention(s): Limited activity within patient's tolerance;Monitored during session;Premedicated before session;Repositioned;Ice applied     Hand Dominance     Extremity/Trunk Assessment Upper Extremity Assessment Upper Extremity Assessment: Overall WFL for tasks assessed           Communication Communication Communication: No difficulties   Cognition Arousal/Alertness: Awake/alert Behavior During Therapy: WFL for tasks assessed/performed Overall Cognitive Status: Within Functional Limits for tasks assessed                     General Comments       Exercises       Shoulder Instructions      Home Living Family/patient expects to be discharged to:: Skilled nursing facility Living Arrangements: Spouse/significant other  Prior Functioning/Environment Level of Independence: Independent             OT Diagnosis: Generalized weakness;Acute pain   OT Problem List: Decreased strength;Decreased activity tolerance;Pain   OT Treatment/Interventions: Self-care/ADL training;DME and/or AE instruction;Patient/family education    OT Goals(Current goals can be found in the care plan section) Acute Rehab OT Goals Patient Stated Goal: get back to being independent; decreased pain OT Goal Formulation: With patient Time For Goal Achievement: 02/19/15 Potential to Achieve Goals: Good ADL Goals Pt Will Perform Grooming: with supervision;standing Pt Will Transfer to Toilet: with min assist;ambulating;bedside commode Pt Will Perform Toileting - Clothing Manipulation and hygiene: with min assist;sit to/from stand  OT Frequency: Min 2X/week   Barriers to D/C:            Co-evaluation  PT/OT/SLP Co-Evaluation/Treatment: Yes Reason for Co-Treatment: For patient/therapist safety PT goals addressed during session: Mobility/safety with mobility OT goals addressed during session: ADL's and self-care      End of Session CPM Left Knee CPM Left Knee: Off  Activity Tolerance: Patient limited by pain Patient left: in chair;with call bell/phone within reach   Time: 1012-1034 OT Time Calculation (min): 22 min Charges:  OT General Charges $OT Visit: 1 Procedure OT Evaluation $Initial OT Evaluation Tier I: 1 Procedure G-Codes:    Venita Seng 2015-02-19, 12:12 PM   Lesle Chris, OTR/L [phone] 2015-02-19

## 2015-02-12 NOTE — Progress Notes (Signed)
Clinical Social Work Department CLINICAL SOCIAL WORK PLACEMENT NOTE 02/12/2015  Patient:  [name], [name]  Account Number:  [id number] Admit date:  02/11/2015  Clinical Social Worker:  Werner Lean, LCSW  Date/time:  02/12/2015 04:16 PM  Clinical Social Work is seeking post-discharge placement for this patient at the following level of care:   SKILLED NURSING   (*CSW will update this form in Epic as items are completed)     Patient/family provided with [facility] Department of Clinical Social Work's list of facilities offering this level of care within the geographic area requested by the patient (or if unable, by the patient's family).    Patient/family informed of their freedom to choose among providers that offer the needed level of care, that participate in Medicare, Medicaid or managed care program needed by the patient, have an available bed and are willing to accept the patient.  02/12/2015  Patient/family informed of [facility]' ownership interest in [facility], as well as of the fact that they are under no obligation to receive care at this facility.  PASARR submitted to EDS on 02/11/2015 PASARR number received on 02/11/2015  FL2 transmitted to all facilities in geographic area requested by pt/family on  02/12/2015 FL2 transmitted to all facilities within larger geographic area on   Patient informed that his/her managed care company has contracts with or will negotiate with  certain facilities, including the following:     Patient/family informed of bed offers received:  02/12/2015 Patient chooses bed at [facility] Physician recommends and patient chooses bed at    Patient to be transferred to  on   Patient to be transferred to facility by  Patient and family notified of transfer on  Name of family member notified:    The following physician request were entered in Epic:   Additional Comments:  Werner Lean LCSW [phone]

## 2015-02-12 NOTE — Discharge Instructions (Addendum)
Dr. Gaynelle Arabian Total Joint Specialist [facility] [address], [place], [place] [address] [phone]  TOTAL KNEE REPLACEMENT POSTOPERATIVE DIRECTIONS    Knee Rehabilitation, Guidelines Following Surgery  Results after knee surgery are often greatly improved when you follow the exercise, range of motion and muscle strengthening exercises prescribed by your doctor. Safety measures are also important to protect the knee from further injury. Any time any of these exercises cause you to have increased pain or swelling in your knee joint, decrease the amount until you are comfortable again and slowly increase them. If you have problems or questions, call your caregiver or physical therapist for advice.   HOME CARE INSTRUCTIONS  Remove items at home which could result in a fall. This includes throw rugs or furniture in walking pathways.  Continue medications as instructed at time of discharge. You may have some home medications which will be placed on hold until you complete the course of blood thinner medication.  You may start showering once you are discharged home but do not submerge the incision under water. Just pat the incision dry and apply a dry gauze dressing on daily. Walk with walker as instructed.  You may resume a sexual relationship in one month or when given the OK by  your doctor.   Use walker as long as suggested by your caregivers.  Avoid periods of inactivity such as sitting longer than an hour when not asleep. This helps prevent blood clots.  You may put full weight on your legs and walk as much as is comfortable.  You may return to work once you are cleared by your doctor.  Do not drive a car for 6 weeks or until released by you surgeon.   Do not drive while taking narcotics.  Wear the elastic stockings for three weeks following surgery during the day but you may remove then at night. Make sure you keep all of your appointments after your  operation with all of your doctors and caregivers. You should call the office at the above phone number and make an appointment for approximately two weeks after the date of your surgery. Change the dressing daily and reapply a dry dressing each time. Please pick up a stool softener and laxative for home use as long as you are requiring pain medications.  ICE to the affected knee every three hours for 30 minutes at a time and then as needed for pain and swelling.  Continue to use ice on the knee for pain and swelling from surgery. You may notice swelling that will progress down to the foot and ankle.  This is normal after surgery.  Elevate the leg when you are not up walking on it.   It is important for you to complete the blood thinner medication as prescribed by your doctor.  Continue to use the breathing machine which will help keep your temperature down.  It is common for your temperature to cycle up and down following surgery, especially at night when you are not up moving around and exerting yourself.  The breathing machine keeps your lungs expanded and your temperature down.  RANGE OF MOTION AND STRENGTHENING EXERCISES  Rehabilitation of the knee is important following a knee injury or an operation. After just a few days of immobilization, the muscles of the thigh which control the knee become weakened and shrink (atrophy). Knee exercises are designed to build up the tone and strength of the thigh muscles and to improve knee  motion. Often times heat used for twenty to thirty minutes before working out will loosen up your tissues and help with improving the range of motion but do not use heat for the first two weeks following surgery. These exercises can be done on a training (exercise) mat, on the floor, on a table or on a bed. Use what ever works the best and is most comfortable for you Knee exercises include:  Leg Lifts - While your knee is still immobilized in a splint or cast, you can do  straight leg raises. Lift the leg to 60 degrees, hold for 3 sec, and slowly lower the leg. Repeat 10-20 times 2-3 times daily. Perform this exercise against resistance later as your knee gets better.  Quad and Hamstring Sets - Tighten up the muscle on the front of the thigh (Quad) and hold for 5-10 sec. Repeat this 10-20 times hourly. Hamstring sets are done by pushing the foot backward against an object and holding for 5-10 sec. Repeat as with quad sets.  A rehabilitation program following serious knee injuries can speed recovery and prevent re-injury in the future due to weakened muscles. Contact your doctor or a physical therapist for more information on knee rehabilitation.   SKILLED REHAB INSTRUCTIONS: If the patient is transferred to a skilled rehab facility following release from the hospital, a list of the current medications will be sent to the facility for the patient to continue.  When discharged from the skilled rehab facility, please have the facility set up the patient's [place] prior to being released. Also, the skilled facility will be responsible for providing the patient with their medications at time of release from the facility to include their pain medication, the muscle relaxants, and their blood thinner medication. If the patient is still at the rehab facility at time of the two week follow up appointment, the skilled rehab facility will also need to assist the patient in arranging follow up appointment in our office and any transportation needs.  MAKE SURE YOU:  Understand these instructions.  Will watch your condition.  Will get help right away if you are not doing well or get worse.    Pick up stool softner and laxative for home use following surgery while on pain medications. Do not submerge incision under water. Please use good hand washing techniques while changing dressing each day. May shower starting three days after surgery. Please use a clean  towel to pat the incision dry following showers. Continue to use ice for pain and swelling after surgery. Do not use any lotions or creams on the incision until instructed by your surgeon.  Take Xarelto for two and a half more weeks, then discontinue Xarelto. Once the patient has completed the Xarelto, they may resume the 81 mg Aspirin.  Postoperative Constipation Protocol  Constipation - defined medically as fewer than three stools per week and severe constipation as less than one stool per week.  One of the most common issues patients have following surgery is constipation.  Even if you have a regular bowel pattern at home, your normal regimen is likely to be disrupted due to multiple reasons following surgery.  Combination of anesthesia, postoperative narcotics, change in appetite and fluid intake all can affect your bowels.  In order to avoid complications following surgery, here are some recommendations in order to help you during your recovery period.  Colace (docusate) - Pick up an over-the-counter form of Colace or another stool softener and take  twice a day as long as you are requiring postoperative pain medications.  Take with a full glass of water daily.  If you experience loose stools or diarrhea, hold the colace until you stool forms back up.  If your symptoms do not get better within 1 week or if they get worse, check with your doctor.  Dulcolax (bisacodyl) - Pick up over-the-counter and take as directed by the product packaging as needed to assist with the movement of your bowels.  Take with a full glass of water.  Use this product as needed if not relieved by Colace only.   MiraLax (polyethylene glycol) - Pick up over-the-counter to have on hand.  MiraLax is a solution that will increase the amount of water in your bowels to assist with bowel movements.  Take as directed and can mix with a glass of water, juice, soda, coffee, or tea.  Take if you go more than two days without a  movement. Do not use MiraLax more than once per day. Call your doctor if you are still constipated or irregular after using this medication for 7 days in a row.  If you continue to have problems with postoperative constipation, please contact the office for further assistance and recommendations.  If you experience "the worst abdominal pain ever" or develop nausea or vomiting, please contact the office immediatly for further recommendations for treatment.  When discharged from the skilled rehab facility, please have the facility set up the patient's [place] prior to being released.   Also provide the patient with their medications at time of release from the facility to include their pain medication, the muscle relaxants, and their blood thinner medication.  If the patient is still at the rehab facility at time of follow up appointment, please also assist the patient in arranging follow up appointment in our office and any transportation needs. ICE to the affected knee or hip every three hours for 30 minutes at a time and then as needed for pain and swelling.    Information on my medicine - XARELTO (Rivaroxaban)  This medication education was reviewed with me or my healthcare representative as part of my discharge preparation.    Why was Xarelto prescribed for you? Xarelto was prescribed for you to reduce the risk of blood clots forming after orthopedic surgery. The medical term for these abnormal blood clots is venous thromboembolism (VTE).  What do you need to know about xarelto ? Take your Xarelto ONCE DAILY at the same time every day. You may take it either with or without food.  If you have difficulty swallowing the tablet whole, you may crush it and mix in applesauce just prior to taking your dose.  Take Xarelto exactly as prescribed by your doctor and DO NOT stop taking Xarelto without talking to the doctor who prescribed the medication.  Stopping without  other VTE prevention medication to take the place of Xarelto may increase your risk of developing a clot.  After discharge, you should have regular check-up appointments with your healthcare provider that is prescribing your Xarelto.    What do you do if you miss a dose? If you miss a dose, take it as soon as you remember on the same day then continue your regularly scheduled once daily regimen the next day. Do not take two doses of Xarelto on the same day.   Important Safety Information A possible side effect of Xarelto is bleeding. You should call your healthcare provider right  away if you experience any of the following: ? Bleeding from an injury or your nose that does not stop. ? Unusual colored urine (red or dark brown) or unusual colored stools (red or black). ? Unusual bruising for unknown reasons. ? A serious fall or if you hit your head (even if there is no bleeding).  Some medicines may interact with Xarelto and might increase your risk of bleeding while on Xarelto. To help avoid this, consult your healthcare provider or pharmacist prior to using any new prescription or non-prescription medications, including herbals, vitamins, non-steroidal anti-inflammatory drugs (NSAIDs) and supplements.  This website has more information on Xarelto: [email or web address]

## 2015-02-12 NOTE — Evaluation (Signed)
Physical Therapy Evaluation Patient Details Name: [name] MRN: [id number] DOB: [birth year] Today's Date: 02/12/2015   History of Present Illness  Pt is s/p L TKa and R cortizone injection  Clinical Impression  Pt s/p L TKR presents with decreased L LE strength/ROM and post op pain limiting functional mobility.  Pt will benefit from follow up rehab at SNF level to maximize IND and safety prior to return home with ltd assist.    Follow Up Recommendations SNF    Equipment Recommendations  None recommended by PT    Recommendations for Other Services OT consult     Precautions / Restrictions Precautions Precautions: Knee Required Braces or Orthoses: Knee Immobilizer - Left Knee Immobilizer - Left: Discontinue once straight leg raise with < 10 degree lag Restrictions Weight Bearing Restrictions: No Other Position/Activity Restrictions: WBAT      Mobility  Bed Mobility Overal bed mobility: Needs Assistance Bed Mobility: Supine to Sit     Supine to sit: Mod assist     General bed mobility comments: HOB raised, used rail and assist for   Transfers Overall transfer level: Needs assistance Equipment used: Rolling walker (2 wheeled) Transfers: Sit to/from [facility] Sit to Stand: Mod assist;+2 safety/equipment Stand pivot transfers: Mod assist;+2 safety/equipment       General transfer comment: assist to rise and stabilize. Cues for UE/LE placement  Ambulation/Gait Ambulation/Gait assistance: Mod assist;+2 safety/equipment Ambulation Distance (Feet): 5 Feet Assistive device: Rolling walker (2 wheeled) Gait Pattern/deviations: Step-to pattern;Decreased step length - right;Decreased step length - left;Shuffle;Trunk flexed Gait velocity: decr   General Gait Details: cues for sequence, posture and position from [employer]            Wheelchair Mobility    Modified Rankin (Stroke Patients Only)       Balance                                              Pertinent Vitals/Pain Pain Assessment: 0-10 Pain Score: 8  (with weight bearing) Pain Location: L knee Pain Descriptors / Indicators: Aching Pain Intervention(s): Limited activity within patient's tolerance;Monitored during session;Premedicated before session;Repositioned;Ice applied    Home Living Family/patient expects to be discharged to:: Skilled nursing facility Living Arrangements: Spouse/significant other                    Prior Function Level of Independence: Independent               Hand Dominance        Extremity/Trunk Assessment   Upper Extremity Assessment: Overall WFL for tasks assessed           Lower Extremity Assessment: LLE deficits/detail   LLE Deficits / Details: 2/5 quads with AAROM at knee -10 - 30 - pain ltd  Cervical / Trunk Assessment: Normal  Communication   Communication: No difficulties  Cognition Arousal/Alertness: Awake/alert Behavior During Therapy: WFL for tasks assessed/performed Overall Cognitive Status: Within Functional Limits for tasks assessed                      General Comments      Exercises Total Joint Exercises Ankle Circles/Pumps: AROM;Both;15 reps;Supine Quad Sets: AROM;Both;Supine;10 reps Heel Slides: AAROM;10 reps;Supine;Left Straight Leg Raises: AAROM;Left;10 reps;Supine      Assessment/Plan    PT Assessment Patient needs continued PT services  PT Diagnosis Difficulty walking   PT Problem List Decreased strength;Decreased range of motion;Decreased activity tolerance;Decreased mobility;Decreased knowledge of use of DME;Pain  PT Treatment Interventions DME instruction;Gait training;Functional mobility training;Therapeutic activities;Therapeutic exercise;Patient/family education   PT Goals (Current goals can be found in the Care Plan section) Acute Rehab PT Goals Patient Stated Goal: get back to being independent; decreased pain PT Goal Formulation:  With patient Time For Goal Achievement: 02/19/15 Potential to Achieve Goals: Good    Frequency 7X/week   Barriers to discharge        Co-evaluation PT/OT/SLP Co-Evaluation/Treatment: Yes Reason for Co-Treatment: For patient/therapist safety PT goals addressed during session: Mobility/safety with mobility OT goals addressed during session: ADL's and self-care       End of Session Equipment Utilized During Treatment: Gait belt;Left knee immobilizer Activity Tolerance: Patient tolerated treatment well;Patient limited by pain Patient left: in chair;with call bell/phone within reach Nurse Communication: Mobility status         Time: 1173-5670 PT Time Calculation (min) (ACUTE ONLY): 40 min   Charges:   PT Evaluation $Initial PT Evaluation Tier I: 1 Procedure PT Treatments $Gait Training: 8-22 mins $Therapeutic Exercise: 8-22 mins   PT G Codes:        Reshawn Ostlund February 24, 2015, 12:36 PM

## 2015-02-12 NOTE — Progress Notes (Signed)
Physical Therapy Treatment Patient Details Name: [name] MRN: [id number] DOB: 1946/09/01 Today's Date: February 16, 2015    History of Present Illness Pt is s/p L TKa and R cortizone injection    PT Comments    Moving slowly but with increased activity tolerance from am session.  Follow Up Recommendations  SNF     Equipment Recommendations  None recommended by PT    Recommendations for Other Services OT consult     Precautions / Restrictions Precautions Precautions: Knee Required Braces or Orthoses: Knee Immobilizer - Left Knee Immobilizer - Left: Discontinue once straight leg raise with < 10 degree lag Restrictions Weight Bearing Restrictions: No Other Position/Activity Restrictions: WBAT    Mobility  Bed Mobility Overal bed mobility: Needs Assistance Bed Mobility: Sit to Supine       Sit to supine: Mod assist   General bed mobility comments: cues for sequence and use of R LE to self assist  Transfers Overall transfer level: Needs assistance Equipment used: Rolling walker (2 wheeled) Transfers: Sit to/from Stand Sit to Stand: Min assist;Mod assist         General transfer comment: assist to rise and stabilize. Cues for UE/LE placement  Ambulation/Gait Ambulation/Gait assistance: Min assist;Mod assist Ambulation Distance (Feet): 15 Feet (twice) Assistive device: Rolling walker (2 wheeled) Gait Pattern/deviations: Step-to pattern;Decreased step length - right;Decreased step length - left;Shuffle;Trunk flexed Gait velocity: decr   General Gait Details: cues for sequence, posture and position from [employer]            Wheelchair Mobility    Modified Rankin (Stroke Patients Only)       Balance                                    Cognition Arousal/Alertness: Awake/alert Behavior During Therapy: WFL for tasks assessed/performed Overall Cognitive Status: Within Functional Limits for tasks assessed                      Exercises      General Comments        Pertinent Vitals/Pain Pain Assessment: 0-10 Pain Score: 5  Pain Location: L knee Pain Descriptors / Indicators: Aching;Sore Pain Intervention(s): Limited activity within patient's tolerance;Monitored during session;Premedicated before session;Ice applied    Home Living                      Prior Function            PT Goals (current goals can now be found in the care plan section) Acute Rehab PT Goals Patient Stated Goal: get back to being independent; decreased pain PT Goal Formulation: With patient Time For Goal Achievement: 02/19/15 Potential to Achieve Goals: Good Progress towards PT goals: Progressing toward goals    Frequency  7X/week    PT Plan Current plan remains appropriate    Co-evaluation             End of Session Equipment Utilized During Treatment: Gait belt;Left knee immobilizer Activity Tolerance: Patient tolerated treatment well Patient left: in bed;with call bell/phone within reach;with family/visitor present     Time: 1450-1525 PT Time Calculation (min) (ACUTE ONLY): 35 min  Charges:  $Gait Training: 23-37 mins                    G Codes:      Ruari Mudgett 2015/02/16, 3:41  PM

## 2015-02-13 DIAGNOSIS — M25662 Stiffness of left knee, not elsewhere classified: Secondary | ICD-10-CM | POA: Diagnosis not present

## 2015-02-13 DIAGNOSIS — I10 Essential (primary) hypertension: Secondary | ICD-10-CM | POA: Diagnosis not present

## 2015-02-13 DIAGNOSIS — R269 Unspecified abnormalities of gait and mobility: Secondary | ICD-10-CM | POA: Diagnosis not present

## 2015-02-13 DIAGNOSIS — M6281 Muscle weakness (generalized): Secondary | ICD-10-CM | POA: Diagnosis not present

## 2015-02-13 DIAGNOSIS — D72829 Elevated white blood cell count, unspecified: Secondary | ICD-10-CM | POA: Diagnosis not present

## 2015-02-13 DIAGNOSIS — K219 Gastro-esophageal reflux disease without esophagitis: Secondary | ICD-10-CM | POA: Diagnosis not present

## 2015-02-13 DIAGNOSIS — J449 Chronic obstructive pulmonary disease, unspecified: Secondary | ICD-10-CM | POA: Diagnosis not present

## 2015-02-13 DIAGNOSIS — R278 Other lack of coordination: Secondary | ICD-10-CM | POA: Diagnosis not present

## 2015-02-13 DIAGNOSIS — R531 Weakness: Secondary | ICD-10-CM | POA: Diagnosis not present

## 2015-02-13 DIAGNOSIS — Z471 Aftercare following joint replacement surgery: Secondary | ICD-10-CM | POA: Diagnosis not present

## 2015-02-13 DIAGNOSIS — M3481 Systemic sclerosis with lung involvement: Secondary | ICD-10-CM | POA: Diagnosis not present

## 2015-02-13 DIAGNOSIS — R2681 Unsteadiness on feet: Secondary | ICD-10-CM | POA: Diagnosis not present

## 2015-02-13 DIAGNOSIS — M1712 Unilateral primary osteoarthritis, left knee: Secondary | ICD-10-CM | POA: Diagnosis not present

## 2015-02-13 DIAGNOSIS — K5900 Constipation, unspecified: Secondary | ICD-10-CM | POA: Diagnosis not present

## 2015-02-13 DIAGNOSIS — G4700 Insomnia, unspecified: Secondary | ICD-10-CM | POA: Diagnosis not present

## 2015-02-13 DIAGNOSIS — I5032 Chronic diastolic (congestive) heart failure: Secondary | ICD-10-CM | POA: Diagnosis not present

## 2015-02-13 DIAGNOSIS — Z96652 Presence of left artificial knee joint: Secondary | ICD-10-CM | POA: Diagnosis not present

## 2015-02-13 LAB — CBC
HEMATOCRIT: 40.4 % (ref 36.0–46.0)
HEMOGLOBIN: 13.4 g/dL (ref 12.0–15.0)
MCH: 32.1 pg (ref 26.0–34.0)
MCHC: 33.2 g/dL (ref 30.0–36.0)
MCV: 96.7 fL (ref 78.0–100.0)
Platelets: 247 10*3/uL (ref 150–400)
RBC: 4.18 MIL/uL (ref 3.87–5.11)
RDW: 12.6 % (ref 11.5–15.5)
WBC: 16.2 10*3/uL — AB (ref 4.0–10.5)

## 2015-02-13 LAB — BASIC METABOLIC PANEL
Anion gap: 11 (ref 5–15)
BUN: 13 mg/dL (ref 6–23)
CO2: 30 mmol/L (ref 19–32)
Calcium: 8.5 mg/dL (ref 8.4–10.5)
Chloride: 97 mmol/L (ref 96–112)
Creatinine, Ser: 0.81 mg/dL (ref 0.50–1.10)
GFR calc non Af Amer: 74 mL/min — ABNORMAL LOW (ref >90)
GFR, EST AFRICAN AMERICAN: 86 mL/min — AB (ref >90)
GLUCOSE: 132 mg/dL — AB (ref 70–99)
POTASSIUM: 4.2 mmol/L (ref 3.5–5.1)
Sodium: 138 mmol/L (ref 135–145)

## 2015-02-13 MED ORDER — ONDANSETRON HCL 4 MG PO TABS: 4.0000 mg | ORAL_TABLET | Freq: Four times a day (QID) | ORAL | Status: DC | PRN

## 2015-02-13 MED ORDER — ACETAMINOPHEN 325 MG PO TABS: 650.0000 mg | ORAL_TABLET | Freq: Four times a day (QID) | ORAL | Status: DC | PRN

## 2015-02-13 MED ORDER — BISACODYL 10 MG RE SUPP: 10.0000 mg | Freq: Every day | RECTAL | Status: DC | PRN

## 2015-02-13 MED ORDER — HYDROMORPHONE HCL 2 MG PO TABS: 2.0000 mg | ORAL_TABLET | ORAL | Status: DC | PRN

## 2015-02-13 MED ORDER — POLYETHYLENE GLYCOL 3350 17 G PO PACK: 17.0000 g | PACK | Freq: Every day | ORAL | Status: DC | PRN

## 2015-02-13 MED ORDER — RIVAROXABAN 10 MG PO TABS: 10.0000 mg | ORAL_TABLET | Freq: Every day | ORAL | Status: DC

## 2015-02-13 MED ORDER — METHOCARBAMOL 500 MG PO TABS: 500.0000 mg | ORAL_TABLET | Freq: Four times a day (QID) | ORAL | Status: DC | PRN

## 2015-02-13 MED ORDER — METOCLOPRAMIDE HCL 5 MG PO TABS: 5.0000 mg | ORAL_TABLET | Freq: Three times a day (TID) | ORAL | Status: DC | PRN

## 2015-02-13 MED ORDER — DOCUSATE SODIUM 100 MG PO CAPS: 100.0000 mg | ORAL_CAPSULE | Freq: Two times a day (BID) | ORAL | Status: DC

## 2015-02-13 NOTE — Progress Notes (Signed)
Clinical Social Work Department CLINICAL SOCIAL WORK PLACEMENT NOTE 02/13/2015  Patient:  [name], [name]  Account Number:  [id number] Admit date:  02/11/2015  Clinical Social Worker:  Werner Lean, LCSW  Date/time:  03/16//2016 14:26 PM  Clinical Social Work is seeking post-discharge placement for this patient at the following level of care:   SKILLED NURSING   (*CSW will update this form in Epic as items are completed)     Patient/family provided with [facility] Department of Clinical Social Work's list of facilities offering this level of care within the geographic area requested by the patient (or if unable, by the patient's family).    Patient/family informed of their freedom to choose among providers that offer the needed level of care, that participate in Medicare, Medicaid or managed care program needed by the patient, have an available bed and are willing to accept the patient.  02/12/2015  Patient/family informed of [facility]' ownership interest in [facility], as well as of the fact that they are under no obligation to receive care at this facility.  PASARR submitted to EDS on 02/11/2015 PASARR number received on 02/11/2015  FL2 transmitted to all facilities in geographic area requested by pt/family on  02/12/2015 FL2 transmitted to all facilities within larger geographic area on   Patient informed that his/her managed care company has contracts with or will negotiate with  certain facilities, including the following:     Patient/family informed of bed offers received:  02/12/2015 Patient chooses bed at [place] Physician recommends and patient chooses bed at    Patient to be transferred to [place] on  02/13/2015 Patient to be transferred to facility by [place] Patient and family notified of transfer on 02/13/2015 Name of family member notified:  SPOUSE  The following physician request were entered in Epic:   Additional Comments: Pt / spouse  are in agreement with d/c plan. PT approved transport by car. Insurance provided prior authorization for SNF. NSG reviewed d/c summary, scripts, avs. Scripts included in d/c packet. D/c packet provided to pt prior to d/c.  Werner Lean LCSW

## 2015-02-13 NOTE — Plan of Care (Signed)
Problem: Discharge Progression Outcomes Goal: Anticoagulant follow-up in place Outcome: Not Applicable Date Met:  22/02/54 Xarelto VTE, no f/u needed.

## 2015-02-13 NOTE — Discharge Summary (Signed)
Physician Discharge Summary   Patient ID: [name] MRN: [id number] DOB/AGE: 67-May-1947 67 y.o.  Admit date: 02/11/2015 Discharge date: 02-13-2015  Primary Diagnosis:  Osteoarthritis Bilateral knee(s) Admission Diagnoses:  Past Medical History  Diagnosis Date  . Systemic sclerosis   . Osteoarthritis   . Seasonal allergies   . Interstitial lung disease   . Hx of cholecystectomy   . Rheumatoid arthritis(714.0)   . Raynaud disease   . Hypertension   . Pulmonary fibrosis   . Diastolic congestive heart failure     02/05/15  states no fluid in extremities, breathing same as has been  . Shortness of breath dyspnea     states normal for her  . GERD (gastroesophageal reflux disease)   . History of blood transfusion    Discharge Diagnoses:   Principal Problem:   OA (osteoarthritis) of knee  Estimated body mass index is 37.39 kg/(m^2) as calculated from the following:   Height as of this encounter: _0  (1.6 m).   Weight as of this encounter: 95.709 kg (211 lb).  Procedure:  Procedure(s) (LRB): LEFT TOTAL KNEE ARTHROPLASTY, RIGHT KNEE CORTISONE INJECTION (Left)   Consults: None  HPI: [name] is a 69 y.o. year old female with end stage OA of her left knee with progressively worsening pain and dysfunction. She has constant pain, with activity and at rest and significant functional deficits with difficulties even with ADLs. She has had extensive non-op management including analgesics, injections of cortisone and viscosupplements, and home exercise program, but remains in significant pain with significant dysfunction. Radiographs show bone on bone arthritis medial and patellofemoral. She presents now for left Total Knee Arthroplasty.   Laboratory Data: Admission on 02/11/2015  Component Date Value Ref Range Status  . WBC 02/12/2015 14.8* 4.0 - 10.5 K/uL Final  . RBC 02/12/2015 4.03  3.87 - 5.11 MIL/uL Final  . Hemoglobin 02/12/2015 12.7  12.0 - 15.0 g/dL Final  .  HCT 02/12/2015 38.5  36.0 - 46.0 % Final  . MCV 02/12/2015 95.5  78.0 - 100.0 fL Final  . MCH 02/12/2015 31.5  26.0 - 34.0 pg Final  . MCHC 02/12/2015 33.0  30.0 - 36.0 g/dL Final  . RDW 02/12/2015 12.3  11.5 - 15.5 % Final  . Platelets 02/12/2015 218  150 - 400 K/uL Final  . Sodium 02/12/2015 139  135 - 145 mmol/L Final  . Potassium 02/12/2015 3.7  3.5 - 5.1 mmol/L Final  . Chloride 02/12/2015 104  96 - 112 mmol/L Final  . CO2 02/12/2015 28  19 - 32 mmol/L Final  . Glucose, Bld 02/12/2015 153* 70 - 99 mg/dL Final  . BUN 02/12/2015 12  6 - 23 mg/dL Final  . Creatinine, Ser 02/12/2015 0.68  0.50 - 1.10 mg/dL Final  . Calcium 02/12/2015 8.5  8.4 - 10.5 mg/dL Final  . GFR calc non Af Amer 02/12/2015 89* >90 mL/min Final  . GFR calc Af Amer 02/12/2015 >90  >90 mL/min Final   Comment: (NOTE) The eGFR has been calculated using the CKD EPI equation. This calculation has not been validated in all clinical situations. eGFR's persistently <90 mL/min signify possible Chronic Kidney Disease.   . Anion gap 02/12/2015 7  5 - 15 Final  . WBC 02/13/2015 16.2* 4.0 - 10.5 K/uL Final  . RBC 02/13/2015 4.18  3.87 - 5.11 MIL/uL Final  . Hemoglobin 02/13/2015 13.4  12.0 - 15.0 g/dL Final  . HCT 02/13/2015 40.4  36.0 - 46.0 %  Final  . MCV 02/13/2015 96.7  78.0 - 100.0 fL Final  . MCH 02/13/2015 32.1  26.0 - 34.0 pg Final  . MCHC 02/13/2015 33.2  30.0 - 36.0 g/dL Final  . RDW 02/13/2015 12.6  11.5 - 15.5 % Final  . Platelets 02/13/2015 247  150 - 400 K/uL Final  . Sodium 02/13/2015 138  135 - 145 mmol/L Final  . Potassium 02/13/2015 4.2  3.5 - 5.1 mmol/L Final  . Chloride 02/13/2015 97  96 - 112 mmol/L Final  . CO2 02/13/2015 30  19 - 32 mmol/L Final  . Glucose, Bld 02/13/2015 132* 70 - 99 mg/dL Final  . BUN 02/13/2015 13  6 - 23 mg/dL Final  . Creatinine, Ser 02/13/2015 0.81  0.50 - 1.10 mg/dL Final  . Calcium 02/13/2015 8.5  8.4 - 10.5 mg/dL Final  . GFR calc non Af Amer 02/13/2015 74* >90 mL/min  Final  . GFR calc Af Amer 02/13/2015 86* >90 mL/min Final   Comment: (NOTE) The eGFR has been calculated using the CKD EPI equation. This calculation has not been validated in all clinical situations. eGFR's persistently <90 mL/min signify possible Chronic Kidney Disease.   . Anion gap 02/13/2015 11  5 - 15 Final  Clinical Support on 02/07/2015  Component Date Value Ref Range Status  . FVC-Pre 02/07/2015 1.97   Final  . FVC-%Pred-Pre 02/07/2015 68   Final  . FVC-Post 02/07/2015 1.94   Final  . FVC-%Pred-Post 02/07/2015 67   Final  . FVC-%Change-Post 02/07/2015 -1   Final  . FEV1-Pre 02/07/2015 1.50   Final  . FEV1-%Pred-Pre 02/07/2015 68   Final  . FEV1-Post 02/07/2015 1.56   Final  . FEV1-%Pred-Post 02/07/2015 71   Final  . FEV1-%Change-Post 02/07/2015 3   Final  . FEV6-Pre 02/07/2015 1.94   Final  . FEV6-%Pred-Pre 02/07/2015 70   Final  . FEV6-Post 02/07/2015 1.93   Final  . FEV6-%Pred-Post 02/07/2015 70   Final  . FEV6-%Change-Post 02/07/2015 0   Final  . Pre FEV1/FVC ratio 02/07/2015 76   Final  . FEV1FVC-%Pred-Pre 02/07/2015 99   Final  . Post FEV1/FVC ratio 02/07/2015 80   Final  . FEV1FVC-%Change-Post 02/07/2015 5   Final  . Pre FEV6/FVC Ratio 02/07/2015 99   Final  . FEV6FVC-%Pred-Pre 02/07/2015 102   Final  . Post FEV6/FVC ratio 02/07/2015 100   Final  . FEV6FVC-%Pred-Post 02/07/2015 103   Final  . FEV6FVC-%Change-Post 02/07/2015 0   Final  . FEF 25-75 Pre 02/07/2015 1.14   Final  . FEF2575-%Pred-Pre 02/07/2015 59   Final  . FEF 25-75 Post 02/07/2015 1.41   Final  . FEF2575-%Pred-Post 02/07/2015 72   Final  . FEF2575-%Change-Post 02/07/2015 23   Final  . RV 02/07/2015 1.03   Final  . RV % pred 02/07/2015 50   Final  . TLC 02/07/2015 2.92   Final  . TLC % pred 02/07/2015 61   Final  . DLCO unc 02/07/2015 15.60   Final  . DLCO unc % pred 02/07/2015 72   Final  . DL/VA 02/07/2015 5.25   Final  . DL/VA % pred 02/07/2015 115   Final  Hospital Outpatient Visit on  02/06/2015  Component Date Value Ref Range Status  . aPTT 02/06/2015 41* 24 - 37 seconds Final   Comment:        IF BASELINE aPTT IS ELEVATED, SUGGEST PATIENT RISK ASSESSMENT BE USED TO DETERMINE APPROPRIATE ANTICOAGULANT THERAPY.   . WBC 02/06/2015 8.0  4.0 - 10.5 K/uL Final  . RBC 02/06/2015 4.37  3.87 - 5.11 MIL/uL Final  . Hemoglobin 02/06/2015 14.0  12.0 - 15.0 g/dL Final  . HCT 02/06/2015 42.6  36.0 - 46.0 % Final  . MCV 02/06/2015 97.5  78.0 - 100.0 fL Final  . MCH 02/06/2015 32.0  26.0 - 34.0 pg Final  . MCHC 02/06/2015 32.9  30.0 - 36.0 g/dL Final  . RDW 02/06/2015 12.6  11.5 - 15.5 % Final  . Platelets 02/06/2015 256  150 - 400 K/uL Final  . Sodium 02/06/2015 139  135 - 145 mmol/L Final  . Potassium 02/06/2015 4.7  3.5 - 5.1 mmol/L Final  . Chloride 02/06/2015 102  96 - 112 mmol/L Final  . CO2 02/06/2015 30  19 - 32 mmol/L Final  . Glucose, Bld 02/06/2015 88  70 - 99 mg/dL Final  . BUN 02/06/2015 24* 6 - 23 mg/dL Final  . Creatinine, Ser 02/06/2015 0.91  0.50 - 1.10 mg/dL Final  . Calcium 02/06/2015 9.1  8.4 - 10.5 mg/dL Final  . Total Protein 02/06/2015 7.4  6.0 - 8.3 g/dL Final  . Albumin 02/06/2015 4.3  3.5 - 5.2 g/dL Final  . AST 02/06/2015 38* 0 - 37 U/L Final  . ALT 02/06/2015 35  0 - 35 U/L Final  . Alkaline Phosphatase 02/06/2015 76  39 - 117 U/L Final  . Total Bilirubin 02/06/2015 1.1  0.3 - 1.2 mg/dL Final  . GFR calc non Af Amer 02/06/2015 64* >90 mL/min Final  . GFR calc Af Amer 02/06/2015 75* >90 mL/min Final   Comment: (NOTE) The eGFR has been calculated using the CKD EPI equation. This calculation has not been validated in all clinical situations. eGFR's persistently <90 mL/min signify possible Chronic Kidney Disease.   . Anion gap 02/06/2015 7  5 - 15 Final  . Prothrombin Time 02/06/2015 14.3  11.6 - 15.2 seconds Final  . INR 02/06/2015 1.10  0.00 - 1.49 Final  . Color, Urine 02/06/2015 AMBER* YELLOW Final   BIOCHEMICALS MAY BE AFFECTED BY COLOR    . APPearance 02/06/2015 CLEAR  CLEAR Final  . Specific Gravity, Urine 02/06/2015 1.023  1.005 - 1.030 Final  . pH 02/06/2015 5.5  5.0 - 8.0 Final  . Glucose, UA 02/06/2015 NEGATIVE  NEGATIVE mg/dL Final  . Hgb urine dipstick 02/06/2015 NEGATIVE  NEGATIVE Final  . Bilirubin Urine 02/06/2015 SMALL* NEGATIVE Final  . Ketones, ur 02/06/2015 NEGATIVE  NEGATIVE mg/dL Final  . Protein, ur 02/06/2015 NEGATIVE  NEGATIVE mg/dL Final  . Urobilinogen, UA 02/06/2015 0.2  0.0 - 1.0 mg/dL Final  . Nitrite 02/06/2015 NEGATIVE  NEGATIVE Final  . Leukocytes, UA 02/06/2015 SMALL* NEGATIVE Final  . MRSA, PCR 02/06/2015 NEGATIVE  NEGATIVE Final  . Staphylococcus aureus 02/06/2015 NEGATIVE  NEGATIVE Final   Comment:        The Xpert SA Assay (FDA approved for NASAL specimens in patients over 39 years of age), is one component of a comprehensive surveillance program.  Test performance has been validated by [facility] for patients greater than or equal to 65 year old. It is not intended to diagnose infection nor to guide or monitor treatment. Performed at [facility]   . ABO/RH(D) 02/06/2015 A POS   Final  . Antibody Screen 02/06/2015 NEG   Final  . Sample Expiration 02/06/2015 02/14/2015   Final  . Squamous Epithelial / LPF 02/06/2015 RARE  RARE Final  . WBC, UA 02/06/2015 0-2  <3  WBC/hpf Final  . Bacteria, UA 02/06/2015 RARE  RARE Final  . Urine-Other 02/06/2015 MUCOUS PRESENT   Final  . ABO/RH(D) 02/06/2015 A POS   Final     X-Rays:No results found.  EKG: Orders placed or performed in visit on 01/25/15  . EKG 12-Lead     Hospital Course: LYNDI HOLBEIN is a 69 y.o. who was admitted to [facility]. They were brought to the operating room on 02/11/2015 and underwent Procedure(s): LEFT TOTAL KNEE ARTHROPLASTY, RIGHT KNEE CORTISONE INJECTION.  Patient tolerated the procedure well and was later transferred to the recovery room and then to the orthopaedic floor for  postoperative care.  They were given PO and IV analgesics for pain control following their surgery.  They were given 24 hours of postoperative antibiotics of  Anti-infectives    Start     Dose/Rate Route Frequency Ordered Stop   02/11/15 1530  ceFAZolin (ANCEF) IVPB 2 g/50 mL premix     2 g 100 mL/hr over 30 Minutes Intravenous Every 6 hours 02/11/15 1315 02/11/15 2319   02/11/15 0639  ceFAZolin (ANCEF) IVPB 2 g/50 mL premix     2 g 100 mL/hr over 30 Minutes Intravenous On call to O.R. 02/11/15 6712 02/11/15 0910     and started on DVT prophylaxis in the form of Xarelto.  Social worker consulted to assist with placement of the patient.  PT and OT were ordered for total joint protocol.  Discharge planning consulted to help with postop disposition and equipment needs.  Patient had a rough night on the evening of surgery with no sleep.  They started to get up OOB with therapy on day one. Hemovac drain was pulled without difficulty.  Continued to work with therapy into day two.  Dressing was changed on day two and the incision was healing well. Patient was seen in rounds on day two and was ready to go to [facility] for further care.  Discharge to SNF Diet - Cardiac diet Follow up - in 2 weeks Activity - WBAT Disposition - Skilled nursing facility Condition Upon Discharge - Good D/C Meds - See DC Summary DVT Prophylaxis - Xarelto   Discharge Instructions    Call MD / Call 911    Complete by:  As directed   If you experience chest pain or shortness of breath, CALL 911 and be transported to the hospital emergency room.  If you develope a fever above 101 F, pus (white drainage) or increased drainage or redness at the wound, or calf pain, call your surgeon's office.     Change dressing    Complete by:  As directed   Change dressing daily with sterile 4 x 4 inch gauze dressing and apply TED hose. Do not submerge the incision under water.     Constipation Prevention    Complete by:  As directed     Drink plenty of fluids.  Prune juice may be helpful.  You may use a stool softener, such as Colace (over the counter) 100 mg twice a day.  Use MiraLax (over the counter) for constipation as needed.     Diet - low sodium heart healthy    Complete by:  As directed      Discharge instructions    Complete by:  As directed   Pick up stool softner and laxative for home use following surgery while on pain medications. Do not submerge incision under water. Please use good hand washing techniques while changing dressing  each day. May shower starting three days after surgery. Please use a clean towel to pat the incision dry following showers. Continue to use ice for pain and swelling after surgery. Do not use any lotions or creams on the incision until instructed by your surgeon.  Take Xarelto for two and a half more weeks, then discontinue Xarelto. Once the patient has completed the Xarelto, they may resume the 81 mg Aspirin.  Postoperative Constipation Protocol  Constipation - defined medically as fewer than three stools per week and severe constipation as less than one stool per week.  One of the most common issues patients have following surgery is constipation.  Even if you have a regular bowel pattern at home, your normal regimen is likely to be disrupted due to multiple reasons following surgery.  Combination of anesthesia, postoperative narcotics, change in appetite and fluid intake all can affect your bowels.  In order to avoid complications following surgery, here are some recommendations in order to help you during your recovery period.  Colace (docusate) - Pick up an over-the-counter form of Colace or another stool softener and take twice a day as long as you are requiring postoperative pain medications.  Take with a full glass of water daily.  If you experience loose stools or diarrhea, hold the colace until you stool forms back up.  If your symptoms do not get better within 1 week or if  they get worse, check with your doctor.  Dulcolax (bisacodyl) - Pick up over-the-counter and take as directed by the product packaging as needed to assist with the movement of your bowels.  Take with a full glass of water.  Use this product as needed if not relieved by Colace only.   MiraLax (polyethylene glycol) - Pick up over-the-counter to have on hand.  MiraLax is a solution that will increase the amount of water in your bowels to assist with bowel movements.  Take as directed and can mix with a glass of water, juice, soda, coffee, or tea.  Take if you go more than two days without a movement. Do not use MiraLax more than once per day. Call your doctor if you are still constipated or irregular after using this medication for 7 days in a row.  If you continue to have problems with postoperative constipation, please contact the office for further assistance and recommendations.  If you experience "the worst abdominal pain ever" or develop nausea or vomiting, please contact the office immediatly for further recommendations for treatment.  When discharged from the skilled rehab facility, please have the facility set up the patient's [place] prior to being released.   Also provide the patient with their medications at time of release from the facility to include their pain medication, the muscle relaxants, and their blood thinner medication.  If the patient is still at the rehab facility at time of follow up appointment, please also assist the patient in arranging follow up appointment in our office and any transportation needs. ICE to the affected knee or hip every three hours for 30 minutes at a time and then as needed for pain and swelling.     Do not put a pillow under the knee. Place it under the heel.    Complete by:  As directed      Do not sit on low chairs, stoools or toilet seats, as it may be difficult to get up from low surfaces    Complete by:  As directed  Driving restrictions    Complete by:  As directed   No driving until released by the physician.     Increase activity slowly as tolerated    Complete by:  As directed      Lifting restrictions    Complete by:  As directed   No lifting until released by the physician.     Patient may shower    Complete by:  As directed   You may shower without a dressing once there is no drainage.  Do not wash over the wound.  If drainage remains, do not shower until drainage stops.     TED hose    Complete by:  As directed   Use stockings (TED hose) for 3 weeks on both leg(s).  You may remove them at night for sleeping.     Weight bearing as tolerated    Complete by:  As directed   Laterality:  left  Extremity:  Lower            Medication List    STOP taking these medications        aspirin 81 MG tablet     Biotin 5 MG Caps     GROUND FLAX SEEDS Powd     HUMIRA PEN Tightwad     hydroxychloroquine 200 MG tablet  Commonly known as:  PLAQUENIL     Rice Protein Powd      TAKE these medications        acetaminophen 325 MG tablet  Commonly known as:  TYLENOL  Take 2 tablets (650 mg total) by mouth every 6 (six) hours as needed for mild pain (or Fever >/= 101).     albuterol 108 (90 BASE) MCG/ACT inhaler  Commonly known as:  PROVENTIL HFA;VENTOLIN HFA  Inhale 2 puffs into the lungs every 6 (six) hours as needed for wheezing.     azelastine 0.1 % nasal spray  Commonly known as:  ASTELIN  Place 2 sprays into the nose 2 (two) times daily as needed for allergies. Use in each nostril as directed     bisacodyl 10 MG suppository  Commonly known as:  DULCOLAX  Place 1 suppository (10 mg total) rectally daily as needed for moderate constipation.     docusate sodium 100 MG capsule  Commonly known as:  COLACE  Take 1 capsule (100 mg total) by mouth 2 (two) times daily.     fexofenadine-pseudoephedrine 60-120 MG per tablet  Commonly known as:  ALLEGRA-D  Take 1 tablet by mouth as needed (for  allergies).     furosemide 40 MG tablet  Commonly known as:  LASIX  Take 1 tablet (40 mg total) by mouth 2 (two) times daily.     HYDROmorphone 2 MG tablet  Commonly known as:  DILAUDID  Take 1-2 tablets (2-4 mg total) by mouth every 4 (four) hours as needed for moderate pain or severe pain.     losartan 100 MG tablet  Commonly known as:  COZAAR  Take 100 mg by mouth at bedtime.     methocarbamol 500 MG tablet  Commonly known as:  ROBAXIN  Take 1 tablet (500 mg total) by mouth every 6 (six) hours as needed for muscle spasms.     metoCLOPramide 5 MG tablet  Commonly known as:  REGLAN  Take 1 tablet (5 mg total) by mouth every 8 (eight) hours as needed for nausea (if ondansetron (ZOFRAN) ineffective.).     neomycin-polymyxin b-dexamethasone 3.5-10000-0.1 Susp  Commonly known  as:  MAXITROL  Apply to toe after every soak.     neomycin-polymyxin-hydrocortisone otic solution  Commonly known as:  CORTISPORIN  1 drop to affected area daily, as needed.     ondansetron 4 MG tablet  Commonly known as:  ZOFRAN  Take 1 tablet (4 mg total) by mouth every 6 (six) hours as needed for nausea.     pantoprazole 40 MG tablet  Commonly known as:  PROTONIX  Take 40 mg by mouth 2 (two) times daily.     polyethylene glycol packet  Commonly known as:  MIRALAX / GLYCOLAX  Take 17 g by mouth daily as needed for mild constipation.     predniSONE 5 MG tablet  Commonly known as:  DELTASONE  Take 5 mg by mouth every morning.     rivaroxaban 10 MG Tabs tablet  Commonly known as:  XARELTO  - Take 1 tablet (10 mg total) by mouth daily with breakfast. Take Xarelto for two and a half more weeks, then discontinue Xarelto.  - Once the patient has completed the Xarelto, they may resume the 81 mg Aspirin.     zolpidem 10 MG tablet  Commonly known as:  AMBIEN  Take 10 mg by mouth at bedtime as needed for sleep.           Follow-up Information    Follow up with Gearlean Alf, MD. Schedule an  appointment as soon as possible for a visit in 2 weeks.   Specialty:  Orthopedic Surgery   Why:  Call office ASAP at [phone] to setup two week follow up appointment with Dr. Wynelle Link.   Contact information:   [address] [place] [address] [phone]       Signed: [name], PA-C Orthopaedic Surgery 02/13/2015, 10:44 AM

## 2015-02-13 NOTE — Progress Notes (Signed)
Physical Therapy Treatment Patient Details Name: [name] MRN: [id number] DOB: Jul 18, 1946 Today's Date: 02/13/2015    History of Present Illness Pt is s/p L TKa and R cortizone injection    PT Comments    Pt progressing well with mobility but ltd by pain with knee strength/ROM.    Follow Up Recommendations  SNF     Equipment Recommendations  None recommended by PT    Recommendations for Other Services OT consult     Precautions / Restrictions Precautions Precautions: Knee Required Braces or Orthoses: Knee Immobilizer - Left Knee Immobilizer - Left: Discontinue once straight leg raise with < 10 degree lag Restrictions Weight Bearing Restrictions: No Other Position/Activity Restrictions: WBAT    Mobility  Bed Mobility Overal bed mobility: Needs Assistance Bed Mobility: Supine to Sit     Supine to sit: Mod assist     General bed mobility comments: cues for sequence and use of R LE to self assist  Transfers Overall transfer level: Needs assistance Equipment used: Rolling walker (2 wheeled) Transfers: Sit to/from Stand Sit to Stand: Min assist         General transfer comment: assist to rise and stabilize. Cues for UE/LE placement  Ambulation/Gait Ambulation/Gait assistance: Min assist Ambulation Distance (Feet): 75 Feet (and 15) Assistive device: Rolling walker (2 wheeled) Gait Pattern/deviations: Step-to pattern;Decreased step length - right;Decreased step length - left;Shuffle;Antalgic Gait velocity: decr   General Gait Details: cues for sequence, posture and position from [employer]            Wheelchair Mobility    Modified Rankin (Stroke Patients Only)       Balance                                    Cognition Arousal/Alertness: Awake/alert Behavior During Therapy: WFL for tasks assessed/performed Overall Cognitive Status: Within Functional Limits for tasks assessed                      Exercises  Total Joint Exercises Ankle Circles/Pumps: AROM;Both;15 reps;Supine Quad Sets: AROM;Both;Supine;15 reps Heel Slides: AAROM;10 reps;Supine;Left Straight Leg Raises: AAROM;Left;10 reps;Supine    General Comments        Pertinent Vitals/Pain Pain Assessment: 0-10 Pain Score: 5  Pain Location: L knee Pain Descriptors / Indicators: Aching;Sore Pain Intervention(s): Limited activity within patient's tolerance;Monitored during session;Premedicated before session;Ice applied    Home Living                      Prior Function            PT Goals (current goals can now be found in the care plan section) Acute Rehab PT Goals Patient Stated Goal: get back to being independent; decreased pain PT Goal Formulation: With patient Time For Goal Achievement: 02/19/15 Potential to Achieve Goals: Good Progress towards PT goals: Progressing toward goals    Frequency  7X/week    PT Plan Current plan remains appropriate    Co-evaluation             End of Session Equipment Utilized During Treatment: Gait belt;Left knee immobilizer Activity Tolerance: Patient tolerated treatment well Patient left: in chair;with call bell/phone within reach     Time: 1016-1056 PT Time Calculation (min) (ACUTE ONLY): 40 min  Charges:  $Gait Training: 8-22 mins $Therapeutic Exercise: 8-22 mins $Therapeutic Activity: 8-22 mins  G Codes:      Caelen Higinbotham Mar 05, 2015, 12:52 PM

## 2015-02-13 NOTE — Progress Notes (Signed)
   Subjective: 2 Days Post-Op Procedure(s) (LRB): LEFT TOTAL KNEE ARTHROPLASTY, RIGHT KNEE CORTISONE INJECTION (Left) Patient reports pain as mild.   Patient seen in rounds for Dr. Wynelle Link. Patient is well, but has had some minor complaints of pain in the knee, requiring pain medications Patient is ready to go to [facility]  Objective: Vital signs in last 24 hours: Temp:  [97.9 F (36.6 C)-98.1 F (36.7 C)] 98.1 F (36.7 C) (03/16 0601) Pulse Rate:  [61-71] 71 (03/16 0601) Resp:  [16] 16 (03/16 0601) BP: (121-135)/(49-61) 135/49 mmHg (03/16 0601) SpO2:  [95 %-96 %] 96 % (03/16 0601)  Intake/Output from previous day:  Intake/Output Summary (Last 24 hours) at 02/13/15 1037 Last data filed at 02/13/15 0741  Gross per 24 hour  Intake 516.67 ml  Output   1500 ml  Net -983.33 ml    Intake/Output this shift: Total I/O In: -  Out: 400 [Urine:400]  Labs:  Recent Labs  02/12/15 0435 02/13/15 0539  HGB 12.7 13.4    Recent Labs  02/12/15 0435 02/13/15 0539  WBC 14.8* 16.2*  RBC 4.03 4.18  HCT 38.5 40.4  PLT 218 247    Recent Labs  02/12/15 0435 02/13/15 0539  NA 139 138  K 3.7 4.2  CL 104 97  CO2 28 30  BUN 12 13  CREATININE 0.68 0.81  GLUCOSE 153* 132*  CALCIUM 8.5 8.5   No results for input(s): LABPT, INR in the last 72 hours.  EXAM: General - Patient is Alert, Appropriate and Oriented Extremity - Neurovascular intact Sensation intact distally Dorsiflexion/Plantar flexion intact Incision - clean, dry, no drainage, healing Motor Function - intact, moving foot and toes well on exam.   Assessment/Plan: 2 Days Post-Op Procedure(s) (LRB): LEFT TOTAL KNEE ARTHROPLASTY, RIGHT KNEE CORTISONE INJECTION (Left) Procedure(s) (LRB): LEFT TOTAL KNEE ARTHROPLASTY, RIGHT KNEE CORTISONE INJECTION (Left) Past Medical History  Diagnosis Date  . Systemic sclerosis   . Osteoarthritis   . Seasonal allergies   . Interstitial lung disease   . Hx of  cholecystectomy   . Rheumatoid arthritis(714.0)   . Raynaud disease   . Hypertension   . Pulmonary fibrosis   . Diastolic congestive heart failure     02/05/15  states no fluid in extremities, breathing same as has been  . Shortness of breath dyspnea     states normal for her  . GERD (gastroesophageal reflux disease)   . History of blood transfusion    Principal Problem:   OA (osteoarthritis) of knee  Estimated body mass index is 37.39 kg/(m^2) as calculated from the following:   Height as of this encounter: _0  (1.6 m).   Weight as of this encounter: 95.709 kg (211 lb). Up with therapy Discharge to SNF Diet - Cardiac diet Follow up - in 2 weeks Activity - WBAT Disposition - Skilled nursing facility Condition Upon Discharge - Good D/C Meds - See DC Summary DVT Prophylaxis - Xarelto  [name], PA-C Orthopaedic Surgery 02/13/2015, 10:37 AM

## 2015-02-14 ENCOUNTER — Non-Acute Institutional Stay: Payer: Commercial Managed Care - HMO | Admitting: Internal Medicine

## 2015-02-14 ENCOUNTER — Encounter: Payer: Self-pay | Admitting: Internal Medicine

## 2015-02-14 DIAGNOSIS — M1712 Unilateral primary osteoarthritis, left knee: Secondary | ICD-10-CM

## 2015-02-14 DIAGNOSIS — M3481 Systemic sclerosis with lung involvement: Secondary | ICD-10-CM | POA: Diagnosis not present

## 2015-02-14 DIAGNOSIS — I5032 Chronic diastolic (congestive) heart failure: Secondary | ICD-10-CM | POA: Diagnosis not present

## 2015-02-14 DIAGNOSIS — K5900 Constipation, unspecified: Secondary | ICD-10-CM | POA: Diagnosis not present

## 2015-02-14 DIAGNOSIS — K219 Gastro-esophageal reflux disease without esophagitis: Secondary | ICD-10-CM | POA: Diagnosis not present

## 2015-02-14 DIAGNOSIS — I10 Essential (primary) hypertension: Secondary | ICD-10-CM

## 2015-02-14 DIAGNOSIS — D72829 Elevated white blood cell count, unspecified: Secondary | ICD-10-CM

## 2015-02-14 DIAGNOSIS — G4700 Insomnia, unspecified: Secondary | ICD-10-CM

## 2015-02-14 NOTE — Progress Notes (Signed)
Patient ID: [name], female   DOB: [birth year], 69 y.o.   MRN: [id number]     [facility]   PCP: Asencion Noble, MD  Code Status: full code  Allergies  Allergen Reactions  . Codeine     Neurological problems - hallucinations, "spacey"    Chief Complaint  Patient presents with  . New Admit To SNF     HPI:  69 year old patient is here for short term rehabilitation post hospital admission from 02/11/15-02/13/15 with left knee OA. She underwent left total knee arthroplasty. She has PMH of HTN, CHF, scleroderma, OA, chronic RA among others She is seen in her room today. She has not had a bowel movement for 4 days and complaints of some abdominal discomfort and feeling bloated. Denies nausea or vomiting. Has been passing flatus. She complaints of left knee pain but has not asked for pain medication as she is concerned about worsening her constipation. No other complaints.  Review of Systems:  Constitutional: Negative for fever, chills, malaise/fatigue and diaphoresis.  HENT: Negative for headache, congestion, nasal discharge  Respiratory: Negative for cough, shortness of breath and wheezing.   Cardiovascular: Negative for chest pain, palpitations, leg swelling.  Gastrointestinal: Negative for heartburn, nausea, vomiting. appetite is fair. Genitourinary: Negative for dysuria, flank pain.  Musculoskeletal: Negative for back pain, falls. Skin: Negative for itching, rash.  Neurological: Negative for dizziness, tingling, focal weakness Psychiatric/Behavioral: Negative for depression, anxiety, insomnia and memory loss.    Past Medical History  Diagnosis Date  . Systemic sclerosis   . Osteoarthritis   . Seasonal allergies   . Interstitial lung disease   . Hx of cholecystectomy   . Rheumatoid arthritis(714.0)   . Raynaud disease   . Hypertension   . Pulmonary fibrosis   . Diastolic congestive heart failure     02/05/15  states no fluid in  extremities, breathing same as has been  . Shortness of breath dyspnea     states normal for her  . GERD (gastroesophageal reflux disease)   . History of blood transfusion    Past Surgical History  Procedure Laterality Date  . Vesicovaginal fistula closure w/ tah  1987  . Bladder suspension    . Carpal tunnel release    . Laparoscopic cholecystectomy    . Abdominal hysterectomy    . Tonsillectomy    . Left heart catheterization with coronary/graft angiogram  03/15/2014    Procedure: LEFT HEART CATHETERIZATION WITH Beatrix Fetters;  Surgeon: Burnell Blanks, MD;  Location: [facility];  Service: Cardiovascular;;  . Total knee arthroplasty Left 02/11/2015    Procedure: LEFT TOTAL KNEE ARTHROPLASTY, RIGHT KNEE CORTISONE INJECTION;  Surgeon: Gaynelle Arabian, MD;  Location: [facility] ORS;  Service: Orthopedics;  Laterality: Left;   Social History:   reports that she quit smoking about 26 years ago. Her smoking use included Cigarettes. She has a 20 pack-year smoking history. She has never used smokeless tobacco. She reports that she does not drink alcohol or use illicit drugs.  Family History  Problem Relation Age of Onset  . Stroke Mother   . Heart attack Father     Died of MI at age 67  . Dementia Brother   . Osteoarthritis    . Celiac disease    . Emphysema Father   . Heart disease Mother     CABG at age 67    Medications: Patient's Medications  New Prescriptions   No medications on file  Previous Medications   ACETAMINOPHEN (TYLENOL) 325 MG TABLET    Take 2 tablets (650 mg total) by mouth every 6 (six) hours as needed for mild pain (or Fever >/= 101).   ALBUTEROL (PROVENTIL HFA;VENTOLIN HFA) 108 (90 BASE) MCG/ACT INHALER    Inhale 2 puffs into the lungs every 6 (six) hours as needed for wheezing.    AZELASTINE (ASTELIN) 137 MCG/SPRAY NASAL SPRAY    Place 2 sprays into the nose 2 (two) times daily as needed for allergies. Use in each nostril as directed   BISACODYL  (DULCOLAX) 10 MG SUPPOSITORY    Place 1 suppository (10 mg total) rectally daily as needed for moderate constipation.   DOCUSATE SODIUM (COLACE) 100 MG CAPSULE    Take 1 capsule (100 mg total) by mouth 2 (two) times daily.   FEXOFENADINE-PSEUDOEPHEDRINE (ALLEGRA-D) 60-120 MG PER TABLET    Take 1 tablet by mouth as needed (for allergies).    FUROSEMIDE (LASIX) 40 MG TABLET    Take 1 tablet (40 mg total) by mouth 2 (two) times daily.   HYDROMORPHONE (DILAUDID) 2 MG TABLET    Take 1-2 tablets (2-4 mg total) by mouth every 4 (four) hours as needed for moderate pain or severe pain.   LOSARTAN (COZAAR) 100 MG TABLET    Take 100 mg by mouth at bedtime.    METHOCARBAMOL (ROBAXIN) 500 MG TABLET    Take 1 tablet (500 mg total) by mouth every 6 (six) hours as needed for muscle spasms.   METOCLOPRAMIDE (REGLAN) 5 MG TABLET    Take 1 tablet (5 mg total) by mouth every 8 (eight) hours as needed for nausea (if ondansetron (ZOFRAN) ineffective.).   NEOMYCIN-POLYMYXIN B-DEXAMETHASONE (MAXITROL) 3.5-10000-0.1 SUSP    Apply to toe after every soak.   NEOMYCIN-POLYMYXIN-HYDROCORTISONE (CORTISPORIN) OTIC SOLUTION    1 drop to affected area daily, as needed.   ONDANSETRON (ZOFRAN) 4 MG TABLET    Take 1 tablet (4 mg total) by mouth every 6 (six) hours as needed for nausea.   PANTOPRAZOLE (PROTONIX) 40 MG TABLET    Take 40 mg by mouth 2 (two) times daily.   POLYETHYLENE GLYCOL (MIRALAX / GLYCOLAX) PACKET    Take 17 g by mouth daily as needed for mild constipation.   PREDNISONE (DELTASONE) 5 MG TABLET    Take 5 mg by mouth every morning.    RIVAROXABAN (XARELTO) 10 MG TABS TABLET    Take 1 tablet (10 mg total) by mouth daily with breakfast. Take Xarelto for two and a half more weeks, then discontinue Xarelto. Once the patient has completed the Xarelto, they may resume the 81 mg Aspirin.   ZOLPIDEM (AMBIEN) 10 MG TABLET    Take 10 mg by mouth at bedtime as needed for sleep.  Modified Medications   No medications on file    Discontinued Medications   No medications on file     Physical Exam: Filed Vitals:   02/14/15 1054  BP: 140/73  Pulse: 89  Temp: 100 F (37.8 C)  Resp: 16  SpO2: 94%    General- elderly female, overweight, in no acute distress Head- normocephalic, atraumatic Throat- moist mucus membrane Neck- no cervical lymphadenopathy Cardiovascular- normal s1,s2, no murmurs, palpable dorsalis pedis and radial pulses, trace left leg edema Respiratory- bilateral clear to auscultation, no wheeze, no rhonchi, no crackles, no use of accessory muscles Abdomen- bowel sounds present, soft, non tender, no guarding or rigidity, no CVA tenderness Musculoskeletal- able to move all 4 extremities, limited  range of motion to left knee  Neurological- no focal deficit Skin- warm and dry, dressing over surgical incision of the left knee Psychiatry- alert and oriented to person, place and time, normal mood and affect    Labs reviewed: Basic Metabolic Panel:  Recent Labs  02/06/15 1510 02/12/15 0435 02/13/15 0539  NA 139 139 138  K 4.7 3.7 4.2  CL 102 104 97  CO2 _0 GLUCOSE 88 153* 132*  BUN 24* 12 13  CREATININE 0.91 0.68 0.81  CALCIUM 9.1 8.5 8.5   Liver Function Tests:  Recent Labs  02/06/15 1510  AST 38*  ALT 35  ALKPHOS 76  BILITOT 1.1  PROT 7.4  ALBUMIN 4.3   No results for input(s): LIPASE, AMYLASE in the last 8760 hours. No results for input(s): AMMONIA in the last 8760 hours. CBC:  Recent Labs  03/13/14 1024 02/06/15 1510 02/12/15 0435 02/13/15 0539  WBC 7.5 8.0 14.8* 16.2*  NEUTROABS 4.6  --   --   --   HGB 13.5 14.0 12.7 13.4  HCT 39.8 42.6 38.5 40.4  MCV 94.8 97.5 95.5 96.7  PLT 216.0 256 218 247     Assessment/Plan  Left knee OA S/p left total knee arthroplasty. Will have her work with physical therapy and occupational therapy team to help with gait training and muscle strengthening exercises.fall precautions. Skin care. Encourage to be out of bed.  Continue xarelto for dvt prophylaxis until 03/04/15, then switch to aspirin 81 mg daily. On dilaudid 2 mg 1-2 tab q4h prn pain. Advised nursing to provide one dose now. Continue robaxin 500 mg q6h for muscle spasm. To wear her ted hose. Has f/u with orthopedics  Constipation Currently on colace 100 mg bid, dulcolax suppository prn. Add miralax 17 g po bid x 3 days, then once a day and monitor. Hydration encouraged.  Leukocytosis Likely form her chronic prednisone use. No signs of infection. Monitor clinically with recheck of cbc with diff in 1 week  Chronic diastolic chf euvolemic on exam. Continue lasix 40 mg bid, cozaar 100 mg daily  gerd Continue protonix 40 mg bid  Lung disease with systemic sclerosis Breathing stable. On chronic prednisone 5 mg daily and bronchodilator treatment  Insomnia On ambien 10 mg qhs, continue this  HTN Stable, continue current regimen of lasix and cozaar   Goals of care: short term rehabilitation   Labs/tests ordered: cbc with diff in 1 week  Family/ staff Communication: reviewed care plan with patient and nursing supervisor    Blanchie Serve, MD  [place] [phone] (Monday-Friday 8 am - 5 pm) [phone] (afterhours)

## 2015-03-01 DIAGNOSIS — Z471 Aftercare following joint replacement surgery: Secondary | ICD-10-CM | POA: Diagnosis not present

## 2015-03-01 DIAGNOSIS — M150 Primary generalized (osteo)arthritis: Secondary | ICD-10-CM | POA: Diagnosis not present

## 2015-03-01 DIAGNOSIS — J449 Chronic obstructive pulmonary disease, unspecified: Secondary | ICD-10-CM | POA: Diagnosis not present

## 2015-03-01 DIAGNOSIS — M069 Rheumatoid arthritis, unspecified: Secondary | ICD-10-CM | POA: Diagnosis not present

## 2015-03-01 DIAGNOSIS — J84112 Idiopathic pulmonary fibrosis: Secondary | ICD-10-CM | POA: Diagnosis not present

## 2015-03-01 DIAGNOSIS — G35 Multiple sclerosis: Secondary | ICD-10-CM | POA: Diagnosis not present

## 2015-03-01 DIAGNOSIS — I5030 Unspecified diastolic (congestive) heart failure: Secondary | ICD-10-CM | POA: Diagnosis not present

## 2015-03-01 DIAGNOSIS — I10 Essential (primary) hypertension: Secondary | ICD-10-CM | POA: Diagnosis not present

## 2015-03-01 DIAGNOSIS — Z96652 Presence of left artificial knee joint: Secondary | ICD-10-CM | POA: Diagnosis not present

## 2015-03-04 DIAGNOSIS — Z471 Aftercare following joint replacement surgery: Secondary | ICD-10-CM | POA: Diagnosis not present

## 2015-03-04 DIAGNOSIS — J449 Chronic obstructive pulmonary disease, unspecified: Secondary | ICD-10-CM | POA: Diagnosis not present

## 2015-03-04 DIAGNOSIS — I10 Essential (primary) hypertension: Secondary | ICD-10-CM | POA: Diagnosis not present

## 2015-03-04 DIAGNOSIS — Z96652 Presence of left artificial knee joint: Secondary | ICD-10-CM | POA: Diagnosis not present

## 2015-03-04 DIAGNOSIS — J84112 Idiopathic pulmonary fibrosis: Secondary | ICD-10-CM | POA: Diagnosis not present

## 2015-03-04 DIAGNOSIS — M069 Rheumatoid arthritis, unspecified: Secondary | ICD-10-CM | POA: Diagnosis not present

## 2015-03-04 DIAGNOSIS — G35 Multiple sclerosis: Secondary | ICD-10-CM | POA: Diagnosis not present

## 2015-03-04 DIAGNOSIS — I5030 Unspecified diastolic (congestive) heart failure: Secondary | ICD-10-CM | POA: Diagnosis not present

## 2015-03-04 DIAGNOSIS — M150 Primary generalized (osteo)arthritis: Secondary | ICD-10-CM | POA: Diagnosis not present

## 2015-03-06 DIAGNOSIS — J84112 Idiopathic pulmonary fibrosis: Secondary | ICD-10-CM | POA: Diagnosis not present

## 2015-03-06 DIAGNOSIS — M069 Rheumatoid arthritis, unspecified: Secondary | ICD-10-CM | POA: Diagnosis not present

## 2015-03-06 DIAGNOSIS — M150 Primary generalized (osteo)arthritis: Secondary | ICD-10-CM | POA: Diagnosis not present

## 2015-03-06 DIAGNOSIS — I10 Essential (primary) hypertension: Secondary | ICD-10-CM | POA: Diagnosis not present

## 2015-03-06 DIAGNOSIS — Z96652 Presence of left artificial knee joint: Secondary | ICD-10-CM | POA: Diagnosis not present

## 2015-03-06 DIAGNOSIS — G35 Multiple sclerosis: Secondary | ICD-10-CM | POA: Diagnosis not present

## 2015-03-06 DIAGNOSIS — J449 Chronic obstructive pulmonary disease, unspecified: Secondary | ICD-10-CM | POA: Diagnosis not present

## 2015-03-06 DIAGNOSIS — I5030 Unspecified diastolic (congestive) heart failure: Secondary | ICD-10-CM | POA: Diagnosis not present

## 2015-03-06 DIAGNOSIS — Z471 Aftercare following joint replacement surgery: Secondary | ICD-10-CM | POA: Diagnosis not present

## 2015-03-07 ENCOUNTER — Encounter: Payer: Self-pay | Admitting: Podiatry

## 2015-03-07 ENCOUNTER — Ambulatory Visit (INDEPENDENT_AMBULATORY_CARE_PROVIDER_SITE_OTHER): Payer: Commercial Managed Care - HMO | Admitting: Podiatry

## 2015-03-07 VITALS — BP 136/65 | HR 97 | Resp 16

## 2015-03-07 DIAGNOSIS — L600 Ingrowing nail: Secondary | ICD-10-CM | POA: Diagnosis not present

## 2015-03-07 MED ORDER — CEPHALEXIN 500 MG PO CAPS: 500.0000 mg | ORAL_CAPSULE | Freq: Three times a day (TID) | ORAL | Status: DC

## 2015-03-07 NOTE — Patient Instructions (Signed)

## 2015-03-07 NOTE — Progress Notes (Signed)
She presents today for follow-up of her lateral matrixectomy hallux right. She's doing very well until she had had her left knee replaced and was unable to so her right foot while in recovery. She states that the toe is red and painful hallux right.  Objective: Vital signs are stable she is alert and oriented 3. Pulses remain palpable to the right first metatarsophalangeal joint. She has a dry eschar with surrounding erythema and pain on palpation fibular border hallux right. Mild. Lengths and no malodor.  Assessment: Matrixectomy with mild paronychia hallux right.  Plan: An incision and drainage was performed today with resection of all necrotic tissue along the fibular border of the right hallux. This was performed after local anesthesia was achieved. She tolerated procedure well and she was placed on oral and topical antibiotics. I will follow up with her in 1 week.

## 2015-03-08 DIAGNOSIS — M150 Primary generalized (osteo)arthritis: Secondary | ICD-10-CM | POA: Diagnosis not present

## 2015-03-08 DIAGNOSIS — M069 Rheumatoid arthritis, unspecified: Secondary | ICD-10-CM | POA: Diagnosis not present

## 2015-03-08 DIAGNOSIS — I10 Essential (primary) hypertension: Secondary | ICD-10-CM | POA: Diagnosis not present

## 2015-03-08 DIAGNOSIS — Z471 Aftercare following joint replacement surgery: Secondary | ICD-10-CM | POA: Diagnosis not present

## 2015-03-08 DIAGNOSIS — G35 Multiple sclerosis: Secondary | ICD-10-CM | POA: Diagnosis not present

## 2015-03-08 DIAGNOSIS — J449 Chronic obstructive pulmonary disease, unspecified: Secondary | ICD-10-CM | POA: Diagnosis not present

## 2015-03-08 DIAGNOSIS — J84112 Idiopathic pulmonary fibrosis: Secondary | ICD-10-CM | POA: Diagnosis not present

## 2015-03-08 DIAGNOSIS — Z96652 Presence of left artificial knee joint: Secondary | ICD-10-CM | POA: Diagnosis not present

## 2015-03-08 DIAGNOSIS — I5030 Unspecified diastolic (congestive) heart failure: Secondary | ICD-10-CM | POA: Diagnosis not present

## 2015-03-11 DIAGNOSIS — G35 Multiple sclerosis: Secondary | ICD-10-CM | POA: Diagnosis not present

## 2015-03-11 DIAGNOSIS — M150 Primary generalized (osteo)arthritis: Secondary | ICD-10-CM | POA: Diagnosis not present

## 2015-03-11 DIAGNOSIS — Z96652 Presence of left artificial knee joint: Secondary | ICD-10-CM | POA: Diagnosis not present

## 2015-03-11 DIAGNOSIS — J449 Chronic obstructive pulmonary disease, unspecified: Secondary | ICD-10-CM | POA: Diagnosis not present

## 2015-03-11 DIAGNOSIS — M069 Rheumatoid arthritis, unspecified: Secondary | ICD-10-CM | POA: Diagnosis not present

## 2015-03-11 DIAGNOSIS — Z471 Aftercare following joint replacement surgery: Secondary | ICD-10-CM | POA: Diagnosis not present

## 2015-03-11 DIAGNOSIS — I5030 Unspecified diastolic (congestive) heart failure: Secondary | ICD-10-CM | POA: Diagnosis not present

## 2015-03-11 DIAGNOSIS — I10 Essential (primary) hypertension: Secondary | ICD-10-CM | POA: Diagnosis not present

## 2015-03-11 DIAGNOSIS — J84112 Idiopathic pulmonary fibrosis: Secondary | ICD-10-CM | POA: Diagnosis not present

## 2015-03-13 DIAGNOSIS — Z471 Aftercare following joint replacement surgery: Secondary | ICD-10-CM | POA: Diagnosis not present

## 2015-03-13 DIAGNOSIS — Z96652 Presence of left artificial knee joint: Secondary | ICD-10-CM | POA: Diagnosis not present

## 2015-03-13 DIAGNOSIS — I5030 Unspecified diastolic (congestive) heart failure: Secondary | ICD-10-CM | POA: Diagnosis not present

## 2015-03-13 DIAGNOSIS — J84112 Idiopathic pulmonary fibrosis: Secondary | ICD-10-CM | POA: Diagnosis not present

## 2015-03-13 DIAGNOSIS — M150 Primary generalized (osteo)arthritis: Secondary | ICD-10-CM | POA: Diagnosis not present

## 2015-03-13 DIAGNOSIS — I10 Essential (primary) hypertension: Secondary | ICD-10-CM | POA: Diagnosis not present

## 2015-03-13 DIAGNOSIS — G35 Multiple sclerosis: Secondary | ICD-10-CM | POA: Diagnosis not present

## 2015-03-13 DIAGNOSIS — J449 Chronic obstructive pulmonary disease, unspecified: Secondary | ICD-10-CM | POA: Diagnosis not present

## 2015-03-13 DIAGNOSIS — M069 Rheumatoid arthritis, unspecified: Secondary | ICD-10-CM | POA: Diagnosis not present

## 2015-03-14 ENCOUNTER — Ambulatory Visit (INDEPENDENT_AMBULATORY_CARE_PROVIDER_SITE_OTHER): Payer: Commercial Managed Care - HMO | Admitting: Podiatry

## 2015-03-14 ENCOUNTER — Encounter: Payer: Self-pay | Admitting: Podiatry

## 2015-03-14 VITALS — BP 115/57 | HR 77 | Temp 96.1°F | Resp 13

## 2015-03-14 DIAGNOSIS — L600 Ingrowing nail: Secondary | ICD-10-CM

## 2015-03-14 NOTE — Patient Instructions (Signed)
Epsom Salt Soak Instructions    START THIS 1 WEEK AFTER INITIAL PROCEDURE  Place 1/4 cup of epsom salts in a quart of warm tap water.  Soak your foot or feet in the solution for 20 minutes twice a day until you notice the area has dried and a scab has formed. Continue to apply other medications to the area as directed by the doctor such as polysporin, neosporin or cortisporin drops.  IF YOUR SKIN BECOMES IRRITATED WHILE USING THESE INSTRUCTIONS, IT IS OKAY TO SWITCH TO  WHITE VINEGAR AND WATER. Or you may use antibacterial soap and water to keep the toe clean  Monitor for any signs/symptoms of infection. Call the office immediately if any occur or go directly to the emergency room. Call with any questions/concerns.

## 2015-03-14 NOTE — Progress Notes (Signed)
She presents today 1 week status post matrixectomy fibular border of the hallux right. She states that she continues to soak twice daily and apply Cortisporin Otic as directed. She states that I think it's getting better but it is some red.  Objective: Vital signs are stable she is alert and oriented 3. Pulses are palpable right foot. Mild erythema to the matrixectomy site but there is no purulence and no malodor.  Assessment: Well-healing matrixectomy fibular border hallux right.  Plan: Discontinue Betadine spell with Epsom salts and warm water soaks covered during the day and leave open at night. Once her signs and symptoms of worsening infection should there be any she will notify us immediately. She will continue to soak until this is completely resolved.

## 2015-03-15 DIAGNOSIS — J84112 Idiopathic pulmonary fibrosis: Secondary | ICD-10-CM | POA: Diagnosis not present

## 2015-03-15 DIAGNOSIS — J449 Chronic obstructive pulmonary disease, unspecified: Secondary | ICD-10-CM | POA: Diagnosis not present

## 2015-03-15 DIAGNOSIS — Z471 Aftercare following joint replacement surgery: Secondary | ICD-10-CM | POA: Diagnosis not present

## 2015-03-15 DIAGNOSIS — I10 Essential (primary) hypertension: Secondary | ICD-10-CM | POA: Diagnosis not present

## 2015-03-15 DIAGNOSIS — M069 Rheumatoid arthritis, unspecified: Secondary | ICD-10-CM | POA: Diagnosis not present

## 2015-03-15 DIAGNOSIS — M150 Primary generalized (osteo)arthritis: Secondary | ICD-10-CM | POA: Diagnosis not present

## 2015-03-15 DIAGNOSIS — I5030 Unspecified diastolic (congestive) heart failure: Secondary | ICD-10-CM | POA: Diagnosis not present

## 2015-03-15 DIAGNOSIS — G35 Multiple sclerosis: Secondary | ICD-10-CM | POA: Diagnosis not present

## 2015-03-15 DIAGNOSIS — Z96652 Presence of left artificial knee joint: Secondary | ICD-10-CM | POA: Diagnosis not present

## 2015-03-18 DIAGNOSIS — Z471 Aftercare following joint replacement surgery: Secondary | ICD-10-CM | POA: Diagnosis not present

## 2015-03-18 DIAGNOSIS — M069 Rheumatoid arthritis, unspecified: Secondary | ICD-10-CM | POA: Diagnosis not present

## 2015-03-18 DIAGNOSIS — J84112 Idiopathic pulmonary fibrosis: Secondary | ICD-10-CM | POA: Diagnosis not present

## 2015-03-18 DIAGNOSIS — M150 Primary generalized (osteo)arthritis: Secondary | ICD-10-CM | POA: Diagnosis not present

## 2015-03-18 DIAGNOSIS — J449 Chronic obstructive pulmonary disease, unspecified: Secondary | ICD-10-CM | POA: Diagnosis not present

## 2015-03-18 DIAGNOSIS — I10 Essential (primary) hypertension: Secondary | ICD-10-CM | POA: Diagnosis not present

## 2015-03-18 DIAGNOSIS — G35 Multiple sclerosis: Secondary | ICD-10-CM | POA: Diagnosis not present

## 2015-03-18 DIAGNOSIS — Z96652 Presence of left artificial knee joint: Secondary | ICD-10-CM | POA: Diagnosis not present

## 2015-03-18 DIAGNOSIS — I5030 Unspecified diastolic (congestive) heart failure: Secondary | ICD-10-CM | POA: Diagnosis not present

## 2015-03-20 DIAGNOSIS — I5030 Unspecified diastolic (congestive) heart failure: Secondary | ICD-10-CM | POA: Diagnosis not present

## 2015-03-20 DIAGNOSIS — J449 Chronic obstructive pulmonary disease, unspecified: Secondary | ICD-10-CM | POA: Diagnosis not present

## 2015-03-20 DIAGNOSIS — M069 Rheumatoid arthritis, unspecified: Secondary | ICD-10-CM | POA: Diagnosis not present

## 2015-03-20 DIAGNOSIS — Z471 Aftercare following joint replacement surgery: Secondary | ICD-10-CM | POA: Diagnosis not present

## 2015-03-20 DIAGNOSIS — G35 Multiple sclerosis: Secondary | ICD-10-CM | POA: Diagnosis not present

## 2015-03-20 DIAGNOSIS — J84112 Idiopathic pulmonary fibrosis: Secondary | ICD-10-CM | POA: Diagnosis not present

## 2015-03-20 DIAGNOSIS — M150 Primary generalized (osteo)arthritis: Secondary | ICD-10-CM | POA: Diagnosis not present

## 2015-03-20 DIAGNOSIS — I10 Essential (primary) hypertension: Secondary | ICD-10-CM | POA: Diagnosis not present

## 2015-03-20 DIAGNOSIS — Z96652 Presence of left artificial knee joint: Secondary | ICD-10-CM | POA: Diagnosis not present

## 2015-03-22 ENCOUNTER — Telehealth: Payer: Self-pay | Admitting: *Deleted

## 2015-03-22 DIAGNOSIS — Z471 Aftercare following joint replacement surgery: Secondary | ICD-10-CM | POA: Diagnosis not present

## 2015-03-22 DIAGNOSIS — G35 Multiple sclerosis: Secondary | ICD-10-CM | POA: Diagnosis not present

## 2015-03-22 DIAGNOSIS — I10 Essential (primary) hypertension: Secondary | ICD-10-CM | POA: Diagnosis not present

## 2015-03-22 DIAGNOSIS — Z96652 Presence of left artificial knee joint: Secondary | ICD-10-CM | POA: Diagnosis not present

## 2015-03-22 DIAGNOSIS — J84112 Idiopathic pulmonary fibrosis: Secondary | ICD-10-CM | POA: Diagnosis not present

## 2015-03-22 DIAGNOSIS — I5030 Unspecified diastolic (congestive) heart failure: Secondary | ICD-10-CM | POA: Diagnosis not present

## 2015-03-22 DIAGNOSIS — M069 Rheumatoid arthritis, unspecified: Secondary | ICD-10-CM | POA: Diagnosis not present

## 2015-03-22 DIAGNOSIS — J449 Chronic obstructive pulmonary disease, unspecified: Secondary | ICD-10-CM | POA: Diagnosis not present

## 2015-03-22 DIAGNOSIS — M150 Primary generalized (osteo)arthritis: Secondary | ICD-10-CM | POA: Diagnosis not present

## 2015-03-22 NOTE — Telephone Encounter (Addendum)
Pt states she had an ingrown procedure and the area was infected, has taken an antibiotic, but the area still looks infected. Pt asked if Dr. Milinda Pointer would like to prescribe a new antibiotic.  I left a message informing pt to either continue betadine soaks or switch to 1/4 C epsom salt in qt of warm water soaks, and continue to dress with an antibiotic ointment,until  Dr. Milinda Pointer gave new orders.

## 2015-03-25 DIAGNOSIS — M5416 Radiculopathy, lumbar region: Secondary | ICD-10-CM | POA: Diagnosis not present

## 2015-03-25 NOTE — Telephone Encounter (Signed)
Called patient to schedule an appointment, voicemail has not been set up.

## 2015-03-25 NOTE — Telephone Encounter (Signed)
Make appt to fu if not already made

## 2015-03-26 DIAGNOSIS — Z96652 Presence of left artificial knee joint: Secondary | ICD-10-CM | POA: Diagnosis not present

## 2015-03-26 DIAGNOSIS — Z471 Aftercare following joint replacement surgery: Secondary | ICD-10-CM | POA: Diagnosis not present

## 2015-03-26 NOTE — Telephone Encounter (Signed)
Scheduled appointment to see Dr. Milinda Pointer this Thursday April 28 at 1:45pm.

## 2015-03-28 ENCOUNTER — Encounter: Payer: Self-pay | Admitting: Podiatry

## 2015-03-28 ENCOUNTER — Ambulatory Visit (INDEPENDENT_AMBULATORY_CARE_PROVIDER_SITE_OTHER): Payer: Commercial Managed Care - HMO | Admitting: Podiatry

## 2015-03-28 VITALS — BP 135/74 | HR 65 | Resp 16

## 2015-03-28 DIAGNOSIS — L600 Ingrowing nail: Secondary | ICD-10-CM

## 2015-03-28 NOTE — Progress Notes (Signed)
[name] presents today for follow-up of her matrixectomy tibial border hallux right. She states that just does not seem to be getting any better.  Objective: Vital signs are stable she's alert and oriented 3. Pulses are palpable but slightly diminished right. Capillary fill time to digits one through 5 of the right foot is immediate. She has fibrin deposition along the fibular border of the hallux right with granulation tissue present she does have erythema approximately around the proximal lateral nail fold.  Assessment delayed wound healing possibly associated with vascular disease or chronic infection.  Plan: At this point I performed a another ID clean the area out there is granulation tissue and bleeding present I also sampled some mild drainage. For culture and sensitivity. She tolerated this procedure well follow-up with her in 1 week. She is not considerably better we will consider an arterial study.

## 2015-04-03 ENCOUNTER — Ambulatory Visit (HOSPITAL_COMMUNITY): Payer: Commercial Managed Care - HMO | Attending: Orthopedic Surgery | Admitting: Physical Therapy

## 2015-04-03 DIAGNOSIS — M25562 Pain in left knee: Secondary | ICD-10-CM | POA: Diagnosis not present

## 2015-04-03 DIAGNOSIS — R269 Unspecified abnormalities of gait and mobility: Secondary | ICD-10-CM | POA: Diagnosis not present

## 2015-04-03 DIAGNOSIS — R29898 Other symptoms and signs involving the musculoskeletal system: Secondary | ICD-10-CM

## 2015-04-03 DIAGNOSIS — M25662 Stiffness of left knee, not elsewhere classified: Secondary | ICD-10-CM

## 2015-04-03 DIAGNOSIS — R262 Difficulty in walking, not elsewhere classified: Secondary | ICD-10-CM

## 2015-04-03 NOTE — Therapy (Signed)
[place], [place], [address] Phone: [phone]   Fax:  [phone]  Physical Therapy Evaluation  Patient Details  Name: [name] MRN: [id number] Date of Birth: 1946/04/30 Referring Provider:  Gaynelle Arabian, MD  Encounter Date: 04/03/2015      PT End of Session - 04/03/15 1444    Visit Number 1   Number of Visits 16   Date for PT Re-Evaluation 05/03/15   Authorization Type [employer]   Authorization - Visit Number 1   Authorization - Number of Visits 16   PT Start Time 6962   PT Stop Time 1430   PT Time Calculation (min) 45 min   Activity Tolerance Patient tolerated treatment well   Behavior During Therapy [facility] for tasks assessed/performed      Past Medical History  Diagnosis Date  . Systemic sclerosis   . Osteoarthritis   . Seasonal allergies   . Interstitial lung disease   . Hx of cholecystectomy   . Rheumatoid arthritis(714.0)   . Raynaud disease   . Hypertension   . Pulmonary fibrosis   . Diastolic congestive heart failure     02/05/15  states no fluid in extremities, breathing same as has been  . Shortness of breath dyspnea     states normal for her  . GERD (gastroesophageal reflux disease)   . History of blood transfusion     Past Surgical History  Procedure Laterality Date  . Vesicovaginal fistula closure w/ tah  1987  . Bladder suspension    . Carpal tunnel release    . Laparoscopic cholecystectomy    . Abdominal hysterectomy    . Tonsillectomy    . Left heart catheterization with coronary/graft angiogram  03/15/2014    Procedure: LEFT HEART CATHETERIZATION WITH Beatrix Fetters;  Surgeon: Burnell Blanks, MD;  Location: [facility] CATH LAB;  Service: Cardiovascular;;  . Total knee arthroplasty Left 02/11/2015    Procedure: LEFT TOTAL KNEE ARTHROPLASTY, RIGHT KNEE CORTISONE INJECTION;  Surgeon: Gaynelle Arabian, MD;  Location: [facility] ORS;  Service: Orthopedics;  Laterality:  Left;    There were no vitals filed for this visit.  Visit Diagnosis:  Knee stiffness, left - Plan: PT plan of care cert/re-cert  Left knee pain - Plan: PT plan of care cert/re-cert  Difficulty walking - Plan: PT plan of care cert/re-cert  Left leg weakness - Plan: PT plan of care cert/re-cert      Subjective Assessment - 04/03/15 1350    Pertinent History 02/10/14 Lt TKA, patient had SNF PT  2 weeks then HHPT 4 weeks, and now for outpatient physical therapy. Patient arrives today noting no difficulty perofming grocery shopping but continues to have complaint of tightness and stiffness in Lt knee. Note sminor pain on Lt outer knee related to possibe nerve injury.  Patient is hoping to increase Lt knee strengh so that she can then have Rt TKA.    How long can you walk comfortably? 60 minutes intermittently   Patient Stated Goals To increase Lt knee strength/flexibility so patient can recieve a Rt TKA    Currently in Pain? Yes   Pain Score 0-No pain   Pain Location Knee   Pain Orientation Left   Pain Descriptors / Indicators Tightness   Pain Type Surgical pain   Pain Onset More than a month ago            [facility] PT Assessment - 04/03/15 0001    Assessment  Medical Diagnosis Lt TKA   Onset Date 02/11/15   Next MD Visit Hector Shade,  05/04/15   Prior Therapy yes HHPT, and SNF   Balance Screen   Has the patient fallen in the past 6 months No   Has the patient had a decrease in activity level because of a fear of falling?  No   Is the patient reluctant to leave their home because of a fear of falling?  No   Prior Function   Level of Independence Independent with basic ADLs   Observation/Other Assessments   Focus on Therapeutic Outcomes (FOTO)  54% limited/    Functional Tests   Functional tests Other;Sit to Stand;Other2   Sit to Stand   Comments 14.8 seconds 5x sit to stand test.    Other:   Other/ Comments Walking: trendelenberg gait bilateral.    Other:   Other/Comments  .69 m/s   ROM / Strength   AROM / PROM / Strength AROM;Strength   AROM   AROM Assessment Site Hip;Knee;Ankle   Right/Left Hip Left   Right Hip External Rotation  55   Right Hip Internal Rotation  6   Left Hip External Rotation  45   Left Hip Internal Rotation  45   Right/Left Knee Right;Left   Right Knee Extension 0   Right Knee Flexion 120   Left Knee Extension -2   Left Knee Flexion 112   Right/Left Ankle Right;Left   Right Ankle Dorsiflexion 9   Left Ankle Dorsiflexion 4   Strength   Strength Assessment Site Hip;Knee;Ankle   Right/Left Hip Right;Left   Right Hip Flexion 4/5   Right Hip Extension 4+/5   Right Hip ABduction 4-/5   Left Hip Flexion 4/5   Left Hip Extension 4-/5   Left Hip ABduction 4-/5   Right/Left Knee Right;Left   Right Knee Flexion 4/5   Right Knee Extension 4/5   Left Knee Flexion 4/5   Left Knee Extension 4/5   Right/Left Ankle Right;Left   Right Ankle Dorsiflexion 5/5   Left Ankle Dorsiflexion 5/5                   [facility] Adult PT Treatment/Exercise - 04/03/15 0001    Knee/Hip Exercises: Stretches   Knee: Self-Stretch to increase Flexion 4 reps;20 seconds   Knee: Self-Stretch Limitations anterior knee driver on 12" box   Gastroc Stretch 4 reps;20 seconds   Knee/Hip Exercises: Supine   Bridges 1 set;10 reps;Both;Strengthening                PT Education - 04/03/15 1448    Education provided Yes   Education Details HEP, Diagnosis, prognosis   Person(s) Educated Patient   Methods Explanation;Demonstration;Handout   Comprehension Verbalized understanding;Returned demonstration          PT Short Term Goals - 04/03/15 1448    PT SHORT TERM GOAL #1   Title Patient will dmoenstrate increased knee extension to full ROm to ambualte with normal stride length.    Time 4   Period Weeks   Status New   PT SHORT TERM GOAL #2   Title Patient willdmeonstraqte increased Knee flexiopn ROM to 120 degrees to ba ble to sit to a  normla height chair without compensating through uninvlved LE.    Time 4   Period Weeks   Status New   PT SHORT TERM GOAL #3   Title Patient will dmeonstrate increased Rt hip internal rotation to be able to improve  deceleration mechanics of gait.    Time 4   Period Weeks   Status New   PT SHORT TERM GOAL #4   Title Patient will be independent with HEP.    Time 4   Period Weeks   Status New           PT Long Term Goals - 17-Apr-2015 1451    PT LONG TERM GOAL #1   Title Patient will demonstrate increased ankle dorsiflexion to >12 dgress bilaterally to ambulate wihtout early heel ise   Time 8   Period Weeks   Status New   PT LONG TERM GOAL #2   Title Patient will dmoenstrate increase knee flexion and extension strength to 5/5 MMT to be bale to ambualte up and down stairs without difficulty.    Time 8   Period Weeks   Status New   PT LONG TERM GOAL #3   Title Patient will be bale to dmeosntrate glut max and glut med stength of 5/5 and 4+/5 to be able to ambualte with out trendelendberg gait and adequate hip stability for prolonged ambualtion.    Time 8   Period Weeks   PT LONG TERM GOAL #4   Title Patient will be bale to ambualte at >1.45ms to aRegions Financial Corporationambulator status.    Time 8   Period Weeks   Status New               Plan - 0May 18, 20161444    Clinical Impression Statement Patient demosntrates Lt knee weakness and stiffness s/p Lt knee TKA resulting in difficulty walking > 1hour, decreased gait speed and patient not achievign community ambulation status per gait speed being <1.2 m/s Patient will benefit from skilled physical therapy to increased Lt knee ROM and strength to be able to increase gait efficiency, normalize gait mechaincs and return to normla activitiye ssuch as walking in the community to get groceries. during session patient was iintroduced to HEP exercises to do in additioon to current HEP as given by HHPT.    Pt will benefit from skilled  therapeutic intervention in order to improve on the following deficits Abnormal gait;Decreased strength;Pain;Difficulty walking;Decreased mobility;Decreased activity tolerance;Decreased range of motion;Decreased balance;Impaired flexibility;Decreased endurance   Rehab Potential Good   PT Frequency 2x / week  initial script 2x a week for 4 weeks.    PT Duration 8 weeks   PT Treatment/Interventions Therapeutic exercise;Balance training;Patient/family education;Manual techniques;Functional mobility training   PT Next Visit Plan Introduce 3D hip excursions, LE stretches in standing, mini squats and calf raises.    PT Home Exercise Plan bridges, calf stretch, and anterior knee drivers on step.    Consulted and Agree with Plan of Care Patient          G-Codes - [id number]    Functional Assessment Tool Used FOTO 54% limited   Functional Limitation Mobility: Walking and moving around   Mobility: Walking and Moving Around Current Status [phone] At least 40 percent but less than 60 percent impaired, limited or restricted   Mobility: Walking and Moving Around Goal Status [phone] At least 20 percent but less than 40 percent impaired, limited or restricted       Problem List Patient Active Problem List   Diagnosis Date Noted  . OA (osteoarthritis) of knee 02/11/2015  . Preop cardiovascular exam 01/25/2015  . Osteopenia 09/03/2014  . Dyspnea 08/14/2014  . Chronic diastolic congestive heart failure 08/14/2014  . Pulmonary hypertension 04/25/2014  . Chest pain 03/13/2014  .  Systemic sclerosis   . Osteoarthritis   . Seasonal allergies   . Chronic rheumatic arthritis   . Raynaud disease   . Hypertension   . Pulmonary fibrosis   . Arthritis of knee, degenerative 09/30/2011  . Lung disease with systemic sclerosis 09/30/2011   Devona Konig PT DPT [place] [address] [place], [place], [address] Phone: [phone]   Fax:   [phone]

## 2015-04-03 NOTE — Patient Instructions (Signed)
Bridge   Lie back, legs bent. Inhale, pressing hips up. Keeping ribs in, lengthen lower back. Exhale, rolling down along spine from top. Repeat 10 to 20 times. Do 2 sessions per day.  Copyright  VHI. All rights reserved.   Calf Stretch   Place one leg forward, bent, other leg behind and straight. Lean forward keeping back heel flat. Hold 20 seconds while counting out loud. Repeat with other leg forward. Repeat 4 times. Do 2 sessions per day.  http://gt2.exer.us/478   Copyright  VHI. All rights reserved.

## 2015-04-04 ENCOUNTER — Ambulatory Visit (INDEPENDENT_AMBULATORY_CARE_PROVIDER_SITE_OTHER): Payer: Commercial Managed Care - HMO | Admitting: Podiatry

## 2015-04-04 ENCOUNTER — Ambulatory Visit (INDEPENDENT_AMBULATORY_CARE_PROVIDER_SITE_OTHER): Payer: Commercial Managed Care - HMO

## 2015-04-04 ENCOUNTER — Encounter: Payer: Self-pay | Admitting: Podiatry

## 2015-04-04 DIAGNOSIS — L03011 Cellulitis of right finger: Secondary | ICD-10-CM

## 2015-04-04 DIAGNOSIS — L03041 Acute lymphangitis of right toe: Secondary | ICD-10-CM | POA: Diagnosis not present

## 2015-04-04 DIAGNOSIS — M86171 Other acute osteomyelitis, right ankle and foot: Secondary | ICD-10-CM

## 2015-04-04 DIAGNOSIS — L03032 Cellulitis of left toe: Secondary | ICD-10-CM

## 2015-04-04 DIAGNOSIS — R0989 Other specified symptoms and signs involving the circulatory and respiratory systems: Secondary | ICD-10-CM

## 2015-04-04 DIAGNOSIS — M5416 Radiculopathy, lumbar region: Secondary | ICD-10-CM | POA: Diagnosis not present

## 2015-04-04 MED ORDER — LEVOFLOXACIN 750 MG PO TABS: 750.0000 mg | ORAL_TABLET | Freq: Every day | ORAL | Status: DC

## 2015-04-04 NOTE — Progress Notes (Signed)
She presents today for follow-up of her matrixectomy and IND of her fibular border hallux right. She states there is no longer sore but is still draining. It appears to still have infection. She denies any changes in her past medical history medications or allergies. She denies fever chills nausea and vomiting. She continues to soak the toe daily in Epsom salts and water.  Objective: Vital signs are stable alert and oriented 3. Pulses are moderately diminished to the right foot from previous evaluations. She does have a history of Raynaud's disease. However I placed her in a Trendelenburg position and the right foot blanched severely in the left foot remained pink with capillary refill. Once dependent the right foot was engorged the toes appear to be cyanotic with exception of the hallux barely pinked up. Radiographs were also taken today to evaluate this nonhealing wound it does demonstrate on the proximal lateral aspect of the distal phalanx some mild periostitis consistent with osteomyelitis. Osteomyelitis cannot be ruled out at this point.  Assessment: Osteomyelitis cannot be ruled out at this point nor can peripheral vascular disease of the right lower extremity. Matrixectomy and incision and drainage site appears to be stable but not healing.  Plan: Discussed etiology pathology conservative versus surgical therapies. I encouraged her to soak in Epsom salts daily. Also encouraged the use of Levaquin 750 mg 1 by mouth daily. She is also being referred to the vascular clinic for evaluation. I will follow-up with her in 2 weeks at which time we may do blood work consisting of a sedimentation rate and CBC.

## 2015-04-05 ENCOUNTER — Ambulatory Visit (HOSPITAL_COMMUNITY): Payer: Commercial Managed Care - HMO | Admitting: Physical Therapy

## 2015-04-05 DIAGNOSIS — R262 Difficulty in walking, not elsewhere classified: Secondary | ICD-10-CM

## 2015-04-05 DIAGNOSIS — R269 Unspecified abnormalities of gait and mobility: Secondary | ICD-10-CM | POA: Diagnosis not present

## 2015-04-05 DIAGNOSIS — R29898 Other symptoms and signs involving the musculoskeletal system: Secondary | ICD-10-CM | POA: Diagnosis not present

## 2015-04-05 DIAGNOSIS — M25562 Pain in left knee: Secondary | ICD-10-CM | POA: Diagnosis not present

## 2015-04-05 DIAGNOSIS — M25662 Stiffness of left knee, not elsewhere classified: Secondary | ICD-10-CM

## 2015-04-05 NOTE — Therapy (Signed)
[place], [place], [address] Phone: [phone]   Fax:  [phone]  Physical Therapy Treatment  Patient Details  Name: [name] MRN: [id number] Date of Birth: 1946-07-05 Referring Provider:  No ref. provider found  Encounter Date: 04/05/2015      PT End of Session - 04/05/15 1430    Visit Number 2   Number of Visits 16   Date for PT Re-Evaluation 05/03/15   Authorization Type [employer]   Authorization - Visit Number 2   Authorization - Number of Visits 16   PT Start Time 7209   PT Stop Time 1430   PT Time Calculation (min) 45 min   Activity Tolerance Patient tolerated treatment well   Behavior During Therapy WFL for tasks assessed/performed      Past Medical History  Diagnosis Date  . Systemic sclerosis   . Osteoarthritis   . Seasonal allergies   . Interstitial lung disease   . Hx of cholecystectomy   . Rheumatoid arthritis(714.0)   . Raynaud disease   . Hypertension   . Pulmonary fibrosis   . Diastolic congestive heart failure     02/05/15  states no fluid in extremities, breathing same as has been  . Shortness of breath dyspnea     states normal for her  . GERD (gastroesophageal reflux disease)   . History of blood transfusion     Past Surgical History  Procedure Laterality Date  . Vesicovaginal fistula closure w/ tah  1987  . Bladder suspension    . Carpal tunnel release    . Laparoscopic cholecystectomy    . Abdominal hysterectomy    . Tonsillectomy    . Left heart catheterization with coronary/graft angiogram  03/15/2014    Procedure: LEFT HEART CATHETERIZATION WITH Beatrix Fetters;  Surgeon: Burnell Blanks, MD;  [facility] CATH LAB;  Service: Cardiovascular;;  . Total knee arthroplasty Left 02/11/2015    Procedure: LEFT TOTAL KNEE ARTHROPLASTY, RIGHT KNEE CORTISONE INJECTION;  Surgeon: Gaynelle Arabian, MD;  Location: [facility];  Service: Orthopedics;   Laterality: Left;    There were no vitals filed for this visit.  Visit Diagnosis:  Knee stiffness, left  Left knee pain  Difficulty walking  Left leg weakness      Subjective Assessment - 04/05/15 1348    Subjective "it's feeling better and getting stronger."                         [facility] Adult PT Treatment/Exercise - 04/05/15 0001    Knee/Hip Exercises: Stretches   Active Hamstring Stretch 3 reps;30 seconds   Knee: Self-Stretch to increase Flexion 4 reps;20 seconds   Knee: Self-Stretch Limitations anterior knee driver on 12" box   Gastroc Stretch 4 reps;20 seconds   Knee/Hip Exercises: Standing   Functional Squat 10 reps   Other Standing Knee Exercises knee driver 47S 3 seconds on 12" box   Other Standing Knee Exercises Squa walk around to 4" box   Knee/Hip Exercises: Supine   Heel Slides AAROM;1 set;10 reps   Heel Slides Limitations 10 second hold   Bridges 1 set;10 reps;Both;Strengthening   Bridges Limitations 3 second hold     Manual: quadriceps soft tissue mobilization and scar mobilization.            PT Education - 04/05/15 1426    Education Details HEP: heel slide and 3D hip excursions   Person(s) Educated Patient  Methods Explanation;Demonstration;Handout   Comprehension Verbalized understanding;Returned demonstration          PT Short Term Goals - 04/03/15 1448    PT SHORT TERM GOAL #1   Title Patient will dmoenstrate increased knee extension to full ROm to ambualte with normal stride length.    Time 4   Period Weeks   Status New   PT SHORT TERM GOAL #2   Title Patient willdmeonstraqte increased Knee flexiopn ROM to 120 degrees to ba ble to sit to a normla height chair without compensating through uninvlved LE.    Time 4   Period Weeks   Status New   PT SHORT TERM GOAL #3   Title Patient will dmeonstrate increased Rt hip internal rotation to be able to improve deceleration mechanics of gait.    Time 4   Period Weeks    Status New   PT SHORT TERM GOAL #4   Title Patient will be independent with HEP.    Time 4   Period Weeks   Status New           PT Long Term Goals - 04/03/15 1451    PT LONG TERM GOAL #1   Title Patient will demonstrate increased ankle dorsiflexion to >12 dgress bilaterally to ambulate wihtout early heel ise   Time 8   Period Weeks   Status New   PT LONG TERM GOAL #2   Title Patient will dmoenstrate increase knee flexion and extension strength to 5/5 MMT to be bale to ambualte up and down stairs without difficulty.    Time 8   Period Weeks   Status New   PT LONG TERM GOAL #3   Title Patient will be bale to dmeosntrate glut max and glut med stength of 5/5 and 4+/5 to be able to ambualte with out trendelendberg gait and adequate hip stability for prolonged ambualtion.    Time 8   Period Weeks   PT LONG TERM GOAL #4   Title Patient will be bale to ambualte at >1.55ms to aRegions Financial Corporationambulator status.    Time 8   Period Weeks   Status New               Plan - 04/05/15 1433    Clinical Impression Statement Patient demosntrates Lt knee weakness and stiffness s/p Lt knee TKA resulting in difficulty walking > 1hour, decreased gait speed and patient not achievign community ambulation status per gait speed being <1.2 m/s. Progressed HEP to include 3D hip excursions and squat walk arounds to improve hip rotation during gait.    PT Next Visit Plan introduce lunges to 6" box for strengthening        Problem List Patient Active Problem List   Diagnosis Date Noted  . OA (osteoarthritis) of knee 02/11/2015  . Preop cardiovascular exam 01/25/2015  . Osteopenia 09/03/2014  . Dyspnea 08/14/2014  . Chronic diastolic congestive heart failure 08/14/2014  . Pulmonary hypertension 04/25/2014  . Chest pain 03/13/2014  . Systemic sclerosis   . Osteoarthritis   . Seasonal allergies   . Chronic rheumatic arthritis   . Raynaud disease   . Hypertension   . Pulmonary fibrosis    . Arthritis of knee, degenerative 09/30/2011  . Lung disease with systemic sclerosis 09/30/2011   CDevona KonigPT DPT [address] [address]Phone: [phone]  Fax:  [phone]

## 2015-04-05 NOTE — Patient Instructions (Signed)
Heel Slide   Bend left knee and pull heel toward buttocks. Repeat 10 times. Hold 10 seconds Do 2 sessions per day. Pull with a towel or bed sheet for extra stretch  http://gt2.exer.us/301   Copyright  VHI. All rights reserved.

## 2015-04-08 ENCOUNTER — Telehealth: Payer: Self-pay | Admitting: *Deleted

## 2015-04-08 DIAGNOSIS — R0989 Other specified symptoms and signs involving the circulatory and respiratory systems: Secondary | ICD-10-CM

## 2015-04-08 NOTE — Telephone Encounter (Signed)
"  Calling regarding appointment I had with Dr. Milinda Pointer last Thursday.  He said he was putting in a referral for vascular.  Let me know the status of this.  I'm a little anxious about this."  I returned her call.  [facility] had some changes in there work queues so they didn't receive the order.  I just sent the order again.  You should hear from them to schedule or you can call them.  "So what you're saying is that you resubmitted it?"  Yes I did.  "Okay, thank you."

## 2015-04-09 ENCOUNTER — Encounter (HOSPITAL_COMMUNITY): Payer: Commercial Managed Care - HMO | Admitting: Physical Therapy

## 2015-04-11 ENCOUNTER — Ambulatory Visit (HOSPITAL_COMMUNITY): Payer: Commercial Managed Care - HMO | Admitting: Physical Therapy

## 2015-04-11 DIAGNOSIS — R269 Unspecified abnormalities of gait and mobility: Secondary | ICD-10-CM | POA: Diagnosis not present

## 2015-04-11 DIAGNOSIS — R29898 Other symptoms and signs involving the musculoskeletal system: Secondary | ICD-10-CM | POA: Diagnosis not present

## 2015-04-11 DIAGNOSIS — M25562 Pain in left knee: Secondary | ICD-10-CM | POA: Diagnosis not present

## 2015-04-11 DIAGNOSIS — R262 Difficulty in walking, not elsewhere classified: Secondary | ICD-10-CM

## 2015-04-11 DIAGNOSIS — M25662 Stiffness of left knee, not elsewhere classified: Secondary | ICD-10-CM | POA: Diagnosis not present

## 2015-04-11 NOTE — Therapy (Signed)
[place], [place], [address] Phone: [phone]   Fax:  [phone]  Physical Therapy Treatment  Patient Details  Name: [name] MRN: [id number] Date of Birth: 1946/02/21 Referring Provider:  Asencion Noble, MD  Encounter Date: 04/11/2015      PT End of Session - 04/11/15 1619    Visit Number 3   Number of Visits 16   Date for PT Re-Evaluation 05/03/15   Authorization Type [employer]   Authorization - Visit Number 3   Authorization - Number of Visits 16   PT Start Time 1430   PT Stop Time 1515   PT Time Calculation (min) 45 min      Past Medical History  Diagnosis Date  . Systemic sclerosis   . Osteoarthritis   . Seasonal allergies   . Interstitial lung disease   . Hx of cholecystectomy   . Rheumatoid arthritis(714.0)   . Raynaud disease   . Hypertension   . Pulmonary fibrosis   . Diastolic congestive heart failure     02/05/15  states no fluid in extremities, breathing same as has been  . Shortness of breath dyspnea     states normal for her  . GERD (gastroesophageal reflux disease)   . History of blood transfusion     Past Surgical History  Procedure Laterality Date  . Vesicovaginal fistula closure w/ tah  1987  . Bladder suspension    . Carpal tunnel release    . Laparoscopic cholecystectomy    . Abdominal hysterectomy    . Tonsillectomy    . Left heart catheterization with coronary/graft angiogram  03/15/2014    Procedure: LEFT HEART CATHETERIZATION WITH Beatrix Fetters;  Surgeon: Burnell Blanks, MD;  Location: [facility] CATH LAB;  Service: Cardiovascular;;  . Total knee arthroplasty Left 02/11/2015    Procedure: LEFT TOTAL KNEE ARTHROPLASTY, RIGHT KNEE CORTISONE INJECTION;  Surgeon: Gaynelle Arabian, MD;  Location: [facility] ORS;  Service: Orthopedics;  Laterality: Left;    There were no vitals filed for this visit.  Visit Diagnosis:  Knee stiffness, left  Left knee  pain  Difficulty walking  Left leg weakness      Subjective Assessment - 04/11/15 1435    Subjective Some minor stinging pain in Lt knee   Currently in Pain? Yes   Pain Score 1    Pain Location Knee   Pain Orientation Left   Pain Descriptors / Indicators Tightness            [facility] PT Assessment - 04/11/15 0001    Assessment   Medical Diagnosis Lt TKA   Onset Date 02/11/15   Next MD Visit Hector Shade,  05/04/15   Prior Therapy yes HHPT, and SNF   AROM   Right Hip External Rotation  55   Right Hip Internal Rotation  15   Left Hip External Rotation  45   Left Hip Internal Rotation  45   Right Knee Extension 0   Right Knee Flexion 120   Left Knee Extension 0   Left Knee Flexion 124                     [facility] Adult PT Treatment/Exercise - 04/11/15 0001    Knee/Hip Exercises: Stretches   Active Hamstring Stretch 30 seconds;4 reps   Active Hamstring Stretch Limitations 3 way (times straight ahead   Quad Stretch 3 reps;20 seconds   Knee: Self-Stretch to increase Flexion 4 reps;20  seconds   Knee: Self-Stretch Limitations anterior knee driver on 12" box   Gastroc Stretch 4 reps;20 seconds   Gastroc Stretch Limitations wedge 3 ways    Knee/Hip Exercises: Standing   Heel Raises 10 reps   Heel Raises Limitations heel-toe raises   Forward Lunges 10 reps   Forward Lunges Limitations 6" box static   Functional Squat 10 reps   Functional Squat Limitations 3D hip excursions    Rocker Board 3 minutes   Rocker Board Limitations Lt -Rt   Other Standing Knee Exercises Walking 2D hip excursions 73f 1 RT each   Other Standing Knee Exercises Squat walk around to 4" box                  PT Short Term Goals - 04/03/15 1448    PT SHORT TERM GOAL #1   Title Patient will dmoenstrate increased knee extension to full ROm to ambualte with normal stride length.    Time 4   Period Weeks   Status New   PT SHORT TERM GOAL #2   Title Patient willdmeonstraqte  increased Knee flexiopn ROM to 120 degrees to ba ble to sit to a normla height chair without compensating through uninvlved LE.    Time 4   Period Weeks   Status New   PT SHORT TERM GOAL #3   Title Patient will dmeonstrate increased Rt hip internal rotation to be able to improve deceleration mechanics of gait.    Time 4   Period Weeks   Status New   PT SHORT TERM GOAL #4   Title Patient will be independent with HEP.    Time 4   Period Weeks   Status New           PT Long Term Goals - 04/03/15 1451    PT LONG TERM GOAL #1   Title Patient will demonstrate increased ankle dorsiflexion to >12 dgress bilaterally to ambulate wihtout early heel ise   Time 8   Period Weeks   Status New   PT LONG TERM GOAL #2   Title Patient will dmoenstrate increase knee flexion and extension strength to 5/5 MMT to be bale to ambualte up and down stairs without difficulty.    Time 8   Period Weeks   Status New   PT LONG TERM GOAL #3   Title Patient will be bale to dmeosntrate glut max and glut med stength of 5/5 and 4+/5 to be able to ambualte with out trendelendberg gait and adequate hip stability for prolonged ambualtion.    Time 8   Period Weeks   PT LONG TERM GOAL #4   Title Patient will be bale to ambualte at >1.278m to acRegions Financial Corporationmbulator status.    Time 8   Period Weeks   Status New               Plan - 04/11/15 1620    Clinical Impression Statement Patient dmeonstrates improved Lt knee AROm s/p Lt TKA with ROM WNL 0-120 degrees. patient however continues to display weakenss in bilateral LE and pain in Rt knee secondary to history of osteoarthritis. Session focused on increasing hip, kneee and ankle mobility to noralize gait mechaincs with today alsio initiasting light strengthieng exercises with intorduction of lunging and minisquats.    PT Next Visit Plan introduce side lunges static to 6" box, and 4" step ups.         Problem List Patient Active Problem List  Diagnosis Date Noted  . OA (osteoarthritis) of knee 02/11/2015  . Preop cardiovascular exam 01/25/2015  . Osteopenia 09/03/2014  . Dyspnea 08/14/2014  . Chronic diastolic congestive heart failure 08/14/2014  . Pulmonary hypertension 04/25/2014  . Chest pain 03/13/2014  . Systemic sclerosis   . Osteoarthritis   . Seasonal allergies   . Chronic rheumatic arthritis   . Raynaud disease   . Hypertension   . Pulmonary fibrosis   . Arthritis of knee, degenerative 09/30/2011  . Lung disease with systemic sclerosis 09/30/2011   Devona Konig PT DPT [facility] [address] [place], [place], [address] Phone: [phone]   Fax:  [phone]

## 2015-04-16 ENCOUNTER — Ambulatory Visit (HOSPITAL_COMMUNITY): Payer: Commercial Managed Care - HMO | Admitting: Physical Therapy

## 2015-04-16 DIAGNOSIS — R29898 Other symptoms and signs involving the musculoskeletal system: Secondary | ICD-10-CM | POA: Diagnosis not present

## 2015-04-16 DIAGNOSIS — M25662 Stiffness of left knee, not elsewhere classified: Secondary | ICD-10-CM

## 2015-04-16 DIAGNOSIS — R269 Unspecified abnormalities of gait and mobility: Secondary | ICD-10-CM | POA: Diagnosis not present

## 2015-04-16 DIAGNOSIS — M25562 Pain in left knee: Secondary | ICD-10-CM | POA: Diagnosis not present

## 2015-04-16 DIAGNOSIS — R262 Difficulty in walking, not elsewhere classified: Secondary | ICD-10-CM

## 2015-04-16 NOTE — Therapy (Signed)
[place], [place], [address] Phone: [phone]   Fax:  [phone]  Physical Therapy Treatment  Patient Details  Name: [name] MRN: [id number] Date of Birth: 1946/03/09 Referring Provider:  Gaynelle Arabian, MD  Encounter Date: 04/16/2015      PT End of Session - 04/16/15 1616    Visit Number 4   Number of Visits 16   Date for PT Re-Evaluation 05/03/15   Authorization Type [employer]   Authorization - Visit Number 4   Authorization - Number of Visits 16   PT Start Time 4128   PT Stop Time 1431   PT Time Calculation (min) 40 min   Activity Tolerance Patient tolerated treatment well   Behavior During Therapy [facility] for tasks assessed/performed      Past Medical History  Diagnosis Date  . Systemic sclerosis   . Osteoarthritis   . Seasonal allergies   . Interstitial lung disease   . Hx of cholecystectomy   . Rheumatoid arthritis(714.0)   . Raynaud disease   . Hypertension   . Pulmonary fibrosis   . Diastolic congestive heart failure     02/05/15  states no fluid in extremities, breathing same as has been  . Shortness of breath dyspnea     states normal for her  . GERD (gastroesophageal reflux disease)   . History of blood transfusion     Past Surgical History  Procedure Laterality Date  . Vesicovaginal fistula closure w/ tah  1987  . Bladder suspension    . Carpal tunnel release    . Laparoscopic cholecystectomy    . Abdominal hysterectomy    . Tonsillectomy    . Left heart catheterization with coronary/graft angiogram  03/15/2014    Procedure: LEFT HEART CATHETERIZATION WITH Beatrix Fetters;  Surgeon: Burnell Blanks, MD;  Location: [facility] CATH LAB;  Service: Cardiovascular;;  . Total knee arthroplasty Left 02/11/2015    Procedure: LEFT TOTAL KNEE ARTHROPLASTY, RIGHT KNEE CORTISONE INJECTION;  Surgeon: Gaynelle Arabian, MD;  Location: [facility] ORS;  Service: Orthopedics;  Laterality:  Left;    There were no vitals filed for this visit.  Visit Diagnosis:  Knee stiffness, left  Left knee pain  Difficulty walking  Left leg weakness      Subjective Assessment - 04/16/15 1612    Subjective Patient reports she is having increased pain today, possibly in part to the weather today; stated some concern regarding increased heat in her knee and possible infection and was educated appropriately    Pertinent History 02/10/14 Lt TKA, patient had SNF PT  2 weeks then HHPT 4 weeks, and now for outpatient physical therapy. Patient arrives today noting no difficulty perofming grocery shopping but continues to have complaint of tightness and stiffness in Lt knee. Note sminor pain on Lt outer knee related to possibe nerve injury.  Patient is hoping to increase Lt knee strengh so that she can then have Rt TKA.    Currently in Pain? Yes   Pain Score 6    Pain Location Knee   Pain Orientation Left                         [facility] Adult PT Treatment/Exercise - 04/16/15 0001    Ambulation/Gait   Ambulation/Gait Yes   Ambulation/Gait Assistance 7: Independent   Gait Comments cues for heel-toe gait, improvement of gait fluidity, TKE; difficulty noted on stairs with ciircumduction both legs  on step over step ascent and significant knee instability descent    Knee/Hip Exercises: Stretches   Active Hamstring Stretch 3 reps;30 seconds   Active Hamstring Stretch Limitations stairs    Piriformis Stretch 2 reps;30 seconds   Piriformis Stretch Limitations seated    Gastroc Stretch 3 reps;30 seconds   Gastroc Stretch Limitations slantboard    Knee/Hip Exercises: Standing   Heel Raises 15 reps   Heel Raises Limitations floor    Forward Lunges Both;1 set;10 reps   Forward Lunges Limitations 4 inch step    Side Lunges Both;1 set;10 reps   Side Lunges Limitations 4 inch box    Rocker Board Limitations x20 AP and lateral, U HHA    Other Standing Knee Exercises 3D hip excursions  neutral stance 1x10                PT Education - 04/16/15 1615    Education provided Yes   Education Details educated that knee is likely not infected since there is no excess edema, tenderness, discoloration, or inflammation surrounding incision; educated patient regarding signs of infection to monitor for    Person(s) Educated Patient   Methods Explanation   Comprehension Verbalized understanding          PT Short Term Goals - 04/03/15 1448    PT SHORT TERM GOAL #1   Title Patient will dmoenstrate increased knee extension to full ROm to ambualte with normal stride length.    Time 4   Period Weeks   Status New   PT SHORT TERM GOAL #2   Title Patient willdmeonstraqte increased Knee flexiopn ROM to 120 degrees to ba ble to sit to a normla height chair without compensating through uninvlved LE.    Time 4   Period Weeks   Status New   PT SHORT TERM GOAL #3   Title Patient will dmeonstrate increased Rt hip internal rotation to be able to improve deceleration mechanics of gait.    Time 4   Period Weeks   Status New   PT SHORT TERM GOAL #4   Title Patient will be independent with HEP.    Time 4   Period Weeks   Status New           PT Long Term Goals - 04/03/15 1451    PT LONG TERM GOAL #1   Title Patient will demonstrate increased ankle dorsiflexion to >12 dgress bilaterally to ambulate wihtout early heel ise   Time 8   Period Weeks   Status New   PT LONG TERM GOAL #2   Title Patient will dmoenstrate increase knee flexion and extension strength to 5/5 MMT to be bale to ambualte up and down stairs without difficulty.    Time 8   Period Weeks   Status New   PT LONG TERM GOAL #3   Title Patient will be bale to dmeosntrate glut max and glut med stength of 5/5 and 4+/5 to be able to ambualte with out trendelendberg gait and adequate hip stability for prolonged ambualtion.    Time 8   Period Weeks   PT LONG TERM GOAL #4   Title Patient will be bale to ambualte  at >1.55ms to aRegions Financial Corporationambulator status.    Time 8   Period Weeks   Status New               Plan - 04/16/15 1617    Clinical Impression Statement Continued functional exercises and stretches today, also  performed gait training and stair training. Patient is limited in activity tolerance by her pre-existing pulmonary and cardiac conditions and did receive multiple rest breaks through the session. Educated patient that she will likely require at least 4-6 more sessions of therapy to continue rehabilitating knee, encouraged patient to continue with therapy in order to reach optimal level of function.    Pt will benefit from skilled therapeutic intervention in order to improve on the following deficits Abnormal gait;Decreased strength;Pain;Difficulty walking;Decreased mobility;Decreased activity tolerance;Decreased range of motion;Decreased balance;Impaired flexibility;Decreased endurance   Rehab Potential Good   PT Frequency 2x / week   PT Duration 8 weeks   PT Treatment/Interventions Therapeutic exercise;Balance training;Patient/family education;Manual techniques;Functional mobility training   PT Next Visit Plan Continue functional stretches and exercise program, continue stair training    PT Home Exercise Plan bridges, calf stretch, and anterior knee drivers on step.    Consulted and Agree with Plan of Care Patient        Problem List Patient Active Problem List   Diagnosis Date Noted  . OA (osteoarthritis) of knee 02/11/2015  . Preop cardiovascular exam 01/25/2015  . Osteopenia 09/03/2014  . Dyspnea 08/14/2014  . Chronic diastolic congestive heart failure 08/14/2014  . Pulmonary hypertension 04/25/2014  . Chest pain 03/13/2014  . Systemic sclerosis   . Osteoarthritis   . Seasonal allergies   . Chronic rheumatic arthritis   . Raynaud disease   . Hypertension   . Pulmonary fibrosis   . Arthritis of knee, degenerative 09/30/2011  . Lung disease with systemic  sclerosis 09/30/2011    Deniece Ree PT, DPT [facility] [name] [place], [place], [address] Phone: [phone]   Fax:  [phone]

## 2015-04-17 ENCOUNTER — Ambulatory Visit (HOSPITAL_COMMUNITY)
Admission: RE | Admit: 2015-04-17 | Discharge: 2015-04-17 | Disposition: A | Discharge status: Home / Self Care | Payer: Commercial Managed Care - HMO | Source: Ambulatory Visit | Attending: Cardiology | Admitting: Cardiology

## 2015-04-17 DIAGNOSIS — M7121 Synovial cyst of popliteal space [Baker], right knee: Secondary | ICD-10-CM | POA: Diagnosis not present

## 2015-04-17 DIAGNOSIS — R0989 Other specified symptoms and signs involving the circulatory and respiratory systems: Secondary | ICD-10-CM | POA: Diagnosis not present

## 2015-04-18 ENCOUNTER — Encounter: Payer: Self-pay | Admitting: Podiatry

## 2015-04-18 ENCOUNTER — Encounter (HOSPITAL_COMMUNITY): Payer: Commercial Managed Care - HMO

## 2015-04-18 ENCOUNTER — Ambulatory Visit (INDEPENDENT_AMBULATORY_CARE_PROVIDER_SITE_OTHER): Payer: Commercial Managed Care - HMO | Admitting: Podiatry

## 2015-04-18 VITALS — BP 108/54 | HR 87 | Temp 98.9°F | Resp 13

## 2015-04-18 DIAGNOSIS — R0989 Other specified symptoms and signs involving the circulatory and respiratory systems: Secondary | ICD-10-CM

## 2015-04-18 DIAGNOSIS — L03011 Cellulitis of right finger: Secondary | ICD-10-CM | POA: Diagnosis not present

## 2015-04-18 MED ORDER — LEVOFLOXACIN 750 MG PO TABS: 750.0000 mg | ORAL_TABLET | Freq: Every day | ORAL | Status: DC

## 2015-04-20 NOTE — Progress Notes (Signed)
She presents today for follow-up of her right great toe. States that she has continued to take her Levaquin but is now out of it. She states the toe seems to still be infected. Has recently been to the vascular Department who stated that she had great circulation to the level of the great toe small vessel disease predominating in the toes. She did not see the doctor at that time. She states that the toe still quite painful. She relates a history of scleroderma and Ray Knotts.  Objective: Vital signs are stable she is alert and oriented 3. Pulses are strongly palpable. There appears to be no change in the toe at this point is some fibrin deposition which I cleaned out today with no great purulence and no severe odor. It simply seems to be sitting there doing nothing.  Assessment: Probable osteomyelitis with peripheral vascular disease associated with scleroderma and Raynaud hallux right.  Plan: Continue conservative therapies at this point with antibiotic ointment soaking and Levaquin. She is also to go back to see Dr. Alvester Chou for reevaluation of her vascular status. We will re-x-ray her toes the next time she comes in in 2 weeks. We may need to consider options such as open debridement. May need to consider infectious disease.

## 2015-04-22 ENCOUNTER — Other Ambulatory Visit: Payer: Self-pay | Admitting: Podiatry

## 2015-04-22 MED ORDER — SULFAMETHOXAZOLE-TRIMETHOPRIM 800-160 MG PO TABS: ORAL_TABLET | ORAL | Status: DC

## 2015-04-23 ENCOUNTER — Encounter (HOSPITAL_COMMUNITY): Payer: Commercial Managed Care - HMO

## 2015-04-25 ENCOUNTER — Encounter (HOSPITAL_COMMUNITY): Payer: Commercial Managed Care - HMO

## 2015-04-25 DIAGNOSIS — M5416 Radiculopathy, lumbar region: Secondary | ICD-10-CM | POA: Diagnosis not present

## 2015-04-25 DIAGNOSIS — M349 Systemic sclerosis, unspecified: Secondary | ICD-10-CM | POA: Diagnosis not present

## 2015-04-26 ENCOUNTER — Telehealth (HOSPITAL_COMMUNITY): Payer: Self-pay | Admitting: *Deleted

## 2015-04-30 ENCOUNTER — Ambulatory Visit (INDEPENDENT_AMBULATORY_CARE_PROVIDER_SITE_OTHER): Payer: Commercial Managed Care - HMO | Admitting: Cardiovascular Disease

## 2015-04-30 ENCOUNTER — Encounter (HOSPITAL_COMMUNITY): Payer: Commercial Managed Care - HMO | Admitting: Physical Therapy

## 2015-04-30 ENCOUNTER — Encounter: Payer: Self-pay | Admitting: Cardiovascular Disease

## 2015-04-30 VITALS — BP 134/64 | HR 98 | Ht 63.0 in | Wt 205.0 lb

## 2015-04-30 DIAGNOSIS — L089 Local infection of the skin and subcutaneous tissue, unspecified: Secondary | ICD-10-CM | POA: Diagnosis not present

## 2015-04-30 NOTE — Assessment & Plan Note (Signed)
The patient was referred by Dr. Orlean Bradford from [facility] for evaluation of right great toe infection. She has a history of scleroderma and Raynaud's phenomenon. She's been treated with multiple anti-biotics for her right great toe infection. Lower extremity arterial Doppler studies performed 04/17/15 revealed normal ABIs bilaterally, occluded left peroneal artery with flat toe waveforms (could not obtained TBI). She denies claudication or foot pain. I do not think she has large vessel obstructive disease is possible that her digital vessels are affected by herself scleroderma and Raynaud disease. This may be contributing to her poor response and bikes for her infection. I will see her back on a when necessary basis.

## 2015-04-30 NOTE — Progress Notes (Signed)
The patient was referred by Dr. Orlean Bradford from [facility] for evaluation of right great toe infection. She has a history of scleroderma and Raynaud's phenomenon. She's been treated with multiple anti-biotics for her right great toe infection. Lower extremity arterial Doppler studies performed 04/17/15 revealed normal ABIs bilaterally, occluded left peroneal artery with flat toe waveforms (could not obtained TBI). She denies claudication or foot pain. I do not think she has large vessel obstructive disease is possible that her digital vessels are affected by herself scleroderma and Raynaud disease. This may be contributing to her poor response and bikes for her infection. I will see her back on a when necessary basis.   Lorretta Harp, M.D., [place], [facility], [place] [address] [place], [place]  [address]  [phone] 04/30/2015 1:12 PM

## 2015-04-30 NOTE — Patient Instructions (Signed)
Follow up with Dr Gwenlyn Found as needed.

## 2015-05-02 ENCOUNTER — Ambulatory Visit: Payer: Self-pay

## 2015-05-02 ENCOUNTER — Encounter (HOSPITAL_COMMUNITY): Payer: Commercial Managed Care - HMO | Admitting: Physical Therapy

## 2015-05-02 ENCOUNTER — Encounter: Payer: Self-pay | Admitting: Podiatry

## 2015-05-02 ENCOUNTER — Ambulatory Visit (INDEPENDENT_AMBULATORY_CARE_PROVIDER_SITE_OTHER): Payer: Commercial Managed Care - HMO | Admitting: Podiatry

## 2015-05-02 ENCOUNTER — Telehealth: Payer: Self-pay | Admitting: *Deleted

## 2015-05-02 ENCOUNTER — Ambulatory Visit (INDEPENDENT_AMBULATORY_CARE_PROVIDER_SITE_OTHER): Payer: Commercial Managed Care - HMO

## 2015-05-02 VITALS — BP 107/50 | HR 102 | Resp 16

## 2015-05-02 DIAGNOSIS — M869 Osteomyelitis, unspecified: Secondary | ICD-10-CM

## 2015-05-02 MED ORDER — NITROGLYCERIN 0.2 MG/HR TD PT24: 0.2000 mg | MEDICATED_PATCH | Freq: Every day | TRANSDERMAL | Status: DC

## 2015-05-02 MED ORDER — AMLODIPINE BESYLATE 2.5 MG PO TABS: 2.5000 mg | ORAL_TABLET | Freq: Every day | ORAL | Status: DC

## 2015-05-02 NOTE — Telephone Encounter (Addendum)
MRI orders are faxed to [facility] [phone].  Prior authorization for [facility] is sent on required completed form, pt data sheets, chart notes 03/07/2015 - 04/18/2015 faxed to [phone].  05/03/2015 pt states the Nitro patch was not at the [facility] in [place].  I reviewed the 05/02/2015 ordered medication and the pharmacy confirmed received at 325pm.  I spoke with the [facility] [phone] at 1016am and was informed the rx was received and pt was contacted and will be able to pick up 05/06/2015.

## 2015-05-03 ENCOUNTER — Ambulatory Visit: Payer: Commercial Managed Care - HMO | Admitting: Cardiovascular Disease

## 2015-05-04 NOTE — Progress Notes (Signed)
She presents today for nonhealing ulcerative lesion lateral nail groove status post matrixectomy hallux right. She states that there has been very little change. She states that she is only taking half of her low [place] and her blood pressure is still running relatively normal to low. She denies fever chills nausea vomiting muscle aches and pains.  Objective: I spoke with Dr. Donnella Bi today regarding Masayo's toe. He states that she has perfectly normal flow until the level of the great toe where she develops vasospastic disorder associated with Raynaud's  phenomenon and scleroderma. The ulcerative lesion appears to be unchanged at this point. Radiographs taken today do not demonstrate any cortical changes when compared to previous radiographs. However due to the lack of blood flow and the chronic wound I am concerned for osteomyelitis.  Assessment: Chronic wound fibular border hallux right possibly associated with osteomyelitis.  Plan: In an effort to increase blood flow to her great toe Dr. Gwenlyn Found and I have decided to place her on amlodipine 2.5 mg daily as well as a Nitro-Dur patch 0.2 mg per 24 hours. 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 Should her blood pressure began to drop to low, lower than current values she is to stop the [place]in. We are going to request an MRI to rule out osteomyelitis. If there is no osteomyelitis present I suggest excision and drainage of the lesion at the surgery center. If osteomyelitis is present I suggested biopsy at the surgery center. I will follow-up with her in a couple of weeks and she will continue her antibiosis.

## 2015-05-06 ENCOUNTER — Telehealth: Payer: Self-pay | Admitting: *Deleted

## 2015-05-06 DIAGNOSIS — M869 Osteomyelitis, unspecified: Secondary | ICD-10-CM

## 2015-05-06 NOTE — Telephone Encounter (Addendum)
Correspondence from [facility] states CPT code [phone] does not require Prior authorization for an MRI.  I informed pt of the information from [facility], and pt states Forestine Na can't get her in for 2 weeks, can we get her in soon in [place].  [place] states she would be able to schedule her in [place] this week.  Cancelled the [facility] MRI orders and reordered with [place] and called pt with the [place] scheduling phone, and faxed the signed orders to [phone].

## 2015-05-07 ENCOUNTER — Encounter (HOSPITAL_COMMUNITY): Payer: Commercial Managed Care - HMO

## 2015-05-09 ENCOUNTER — Encounter (HOSPITAL_COMMUNITY): Payer: Commercial Managed Care - HMO

## 2015-05-14 ENCOUNTER — Telehealth: Payer: Self-pay | Admitting: *Deleted

## 2015-05-14 NOTE — Therapy (Addendum)
[place], [place], [address] Phone: [phone]   Fax:  [phone]  Patient Details  Name: [name] MRN: [id number] Date of Birth: [birth year] Referring Provider:  Gaynelle Arabian, MD  Encounter Date: 05/14/2015   PHYSICAL THERAPY DISCHARGE SUMMARY  Visits from Start of Care: 4  Current functional level related to goals / functional outcomes: Patient has not returned since last session on 04/16/15.    Remaining deficits: At time of last treatment session, patient continued to display pain in affected knee, deviations in gait and stair navigation, reduced functional activity tolerance; unable to assess further remaining deficits since patient has not returned Since 5/17.    Education / Equipment: At least treatment session provided education regarding signs of infection to monitor for in incision  Plan: Patient agrees to discharge.  Patient goals were not met. Patient is being discharged due to not returning since the last visit.  ?????       Deniece Ree PT, DPT [facility] [address] [place], [place], [address] Phone: [phone]   Fax:  [phone]

## 2015-05-14 NOTE — Telephone Encounter (Signed)
Authorization # [id number].

## 2015-05-15 ENCOUNTER — Ambulatory Visit
Admission: RE | Admit: 2015-05-15 | Discharge: 2015-05-15 | Disposition: A | Discharge status: Home / Self Care | Payer: Commercial Managed Care - HMO | Source: Ambulatory Visit | Attending: Podiatry | Admitting: Podiatry

## 2015-05-15 DIAGNOSIS — R609 Edema, unspecified: Secondary | ICD-10-CM | POA: Diagnosis not present

## 2015-05-15 DIAGNOSIS — M19071 Primary osteoarthritis, right ankle and foot: Secondary | ICD-10-CM | POA: Diagnosis not present

## 2015-05-15 DIAGNOSIS — M869 Osteomyelitis, unspecified: Secondary | ICD-10-CM

## 2015-05-17 ENCOUNTER — Telehealth: Payer: Self-pay | Admitting: *Deleted

## 2015-05-17 NOTE — Telephone Encounter (Signed)
I ordered disc for SE Overread to be sent from [place], to our office

## 2015-05-17 NOTE — Telephone Encounter (Signed)
-----  Message from Garrel Ridgel, [place] sent at 05/16/2015 12:08 PM EDT ----- Please get disk for an over read and inform patient of the delay.  You may inform her that it looks like osteomyelitis so thus the second opinion.

## 2015-05-20 ENCOUNTER — Ambulatory Visit (INDEPENDENT_AMBULATORY_CARE_PROVIDER_SITE_OTHER): Payer: Commercial Managed Care - HMO | Admitting: Critical Care Medicine

## 2015-05-20 ENCOUNTER — Other Ambulatory Visit: Payer: Self-pay | Admitting: *Deleted

## 2015-05-20 ENCOUNTER — Encounter: Payer: Self-pay | Admitting: Critical Care Medicine

## 2015-05-20 ENCOUNTER — Telehealth: Payer: Self-pay | Admitting: *Deleted

## 2015-05-20 VITALS — BP 122/68 | HR 86 | Temp 97.6°F | Ht 63.0 in | Wt 207.0 lb

## 2015-05-20 DIAGNOSIS — M3481 Systemic sclerosis with lung involvement: Secondary | ICD-10-CM | POA: Diagnosis not present

## 2015-05-20 DIAGNOSIS — J8410 Pulmonary fibrosis, unspecified: Secondary | ICD-10-CM | POA: Diagnosis not present

## 2015-05-20 DIAGNOSIS — M8580 Other specified disorders of bone density and structure, unspecified site: Secondary | ICD-10-CM | POA: Diagnosis not present

## 2015-05-20 DIAGNOSIS — I270 Primary pulmonary hypertension: Secondary | ICD-10-CM

## 2015-05-20 DIAGNOSIS — I272 Pulmonary hypertension, unspecified: Secondary | ICD-10-CM

## 2015-05-20 NOTE — Patient Instructions (Signed)
No change in medications. Return in          1 year

## 2015-05-20 NOTE — Progress Notes (Signed)
Subjective:    Patient ID: [name], female    DOB: 03/19/1946, 69 y.o.   MRN: [id number]  HPI 05/20/2015 Chief Complaint  Patient presents with  . Follow-up    SOB w/activity; cough is seasonal; feels well overall;  There have been no new issues since the last visit.  There is no increased dyspnea over baseline. There is no cough wheezing or mucus production.   Pt with ingrown toenail. Has undergone treatment for this including anti-biotics and local vasodilation with nitroglycerin patch. The patient is now off losartan. Pt getting new Rheumatology MD [facility].   Pt denies any significant sore throat, nasal congestion or excess secretions, fever, chills, sweats, unintended weight loss, pleurtic or exertional chest pain, orthopnea PND, or leg swelling Pt denies any increase in rescue therapy over baseline, denies waking up needing it or having any early am or nocturnal exacerbations of coughing/wheezing/or dyspnea. Pt also denies any obvious fluctuation in symptoms with  weather or environmental change or other alleviating or aggravating factors   Current Medications, Allergies, Complete Past Medical History, Past Surgical History, Family History, and Social History were reviewed in [facility] record per todays encounter:  05/20/2015   Review of Systems  Constitutional: Negative.   HENT: Negative.  Negative for ear pain, postnasal drip, rhinorrhea, sinus pressure, sore throat, trouble swallowing and voice change.   Eyes: Negative.   Respiratory: Positive for cough and shortness of breath. Negative for apnea, choking, chest tightness, wheezing and stridor.   Cardiovascular: Negative.  Negative for chest pain, palpitations and leg swelling.  Gastrointestinal: Negative.  Negative for nausea, vomiting, abdominal pain and abdominal distention.  Genitourinary: Negative.   Musculoskeletal: Negative.  Negative for myalgias and arthralgias.  Skin:  Negative.  Negative for rash.  Allergic/Immunologic: Negative.  Negative for environmental allergies and food allergies.  Neurological: Negative.  Negative for dizziness, syncope, weakness and headaches.  Hematological: Negative.  Negative for adenopathy. Does not bruise/bleed easily.  Psychiatric/Behavioral: Negative.  Negative for sleep disturbance and agitation. The patient is not nervous/anxious.        Objective:   Physical Exam Filed Vitals:   05/20/15 1058  BP: 122/68  Pulse: 86  Temp: 97.6 F (36.4 C)  TempSrc: Oral  Height: _0  (1.6 m)  Weight: 207 lb (93.895 kg)  SpO2: 96%    Gen: Pleasant, well-nourished, in no distress,  normal affect, scleroderma facies  ENT: No lesions,  mouth clear,  oropharynx clear, no postnasal drip  Neck: No JVD, no TMG, no carotid bruits  Lungs: No use of accessory muscles, no dullness to percussion, dry rales at bases   Cardiovascular: RRR, heart sounds normal, no murmur or gallops, no peripheral edema  Abdomen: soft and NT, no HSM,  BS normal  Musculoskeletal: Classic sclerodactyly   Neuro: alert, non focal  Skin: Warm, no lesions or rashes  No results found.        Assessment & Plan:  I personally reviewed all images and lab data in the [facility] system as well as any outside material available during this office visit and agree with the  radiology impressions.   Pulmonary hypertension No true evidence of significant pulmonary hypertension based on May 2015 right heart catheterization Plan Continued monitoring  Pulmonary fibrosis Stable mild pulmonary fibrosis secondary to scleroderma without progression Plan Will monitor without repeating pulmonary functions for now Note patient is obtaining a new rheumatology physician  Osteopenia Stable  Lung disease with systemic sclerosis Stable  lung disease secondary to scleroderma   [name] was seen today for follow-up.  Diagnoses and all orders for this visit:  Pulmonary  hypertension  Pulmonary fibrosis  Osteopenia  Lung disease with systemic sclerosis

## 2015-05-20 NOTE — Telephone Encounter (Signed)
MRI of 05/15/2015 sent to [facility] by Dr. Milinda Pointer.  Pt was informed.

## 2015-05-21 NOTE — Assessment & Plan Note (Signed)
Stable lung disease secondary to scleroderma

## 2015-05-21 NOTE — Assessment & Plan Note (Signed)
Stable

## 2015-05-21 NOTE — Assessment & Plan Note (Signed)
Stable mild pulmonary fibrosis secondary to scleroderma without progression Plan Will monitor without repeating pulmonary functions for now Note patient is obtaining a new rheumatology physician

## 2015-05-21 NOTE — Assessment & Plan Note (Signed)
No true evidence of significant pulmonary hypertension based on May 2015 right heart catheterization Plan Continued monitoring

## 2015-05-23 DIAGNOSIS — M059 Rheumatoid arthritis with rheumatoid factor, unspecified: Secondary | ICD-10-CM | POA: Diagnosis not present

## 2015-05-23 DIAGNOSIS — J8403 Idiopathic pulmonary hemosiderosis: Secondary | ICD-10-CM | POA: Diagnosis not present

## 2015-05-23 DIAGNOSIS — I7300 Raynaud's syndrome without gangrene: Secondary | ICD-10-CM | POA: Diagnosis not present

## 2015-05-23 DIAGNOSIS — Z79899 Other long term (current) drug therapy: Secondary | ICD-10-CM | POA: Diagnosis not present

## 2015-05-23 DIAGNOSIS — M349 Systemic sclerosis, unspecified: Secondary | ICD-10-CM | POA: Diagnosis not present

## 2015-05-28 ENCOUNTER — Telehealth: Payer: Self-pay | Admitting: *Deleted

## 2015-05-28 NOTE — Telephone Encounter (Signed)
MRI Overread reviewed by Dr. Milinda Pointer and positive for osteomyelitis.  Dr. Milinda Pointer would like pt to come in to discuss options.

## 2015-06-05 DIAGNOSIS — N390 Urinary tract infection, site not specified: Secondary | ICD-10-CM | POA: Diagnosis not present

## 2015-06-05 DIAGNOSIS — M5416 Radiculopathy, lumbar region: Secondary | ICD-10-CM | POA: Diagnosis not present

## 2015-06-06 ENCOUNTER — Other Ambulatory Visit (HOSPITAL_COMMUNITY): Payer: Self-pay | Admitting: Internal Medicine

## 2015-06-06 ENCOUNTER — Ambulatory Visit (INDEPENDENT_AMBULATORY_CARE_PROVIDER_SITE_OTHER): Payer: Commercial Managed Care - HMO | Admitting: Podiatry

## 2015-06-06 DIAGNOSIS — L03031 Cellulitis of right toe: Secondary | ICD-10-CM | POA: Diagnosis not present

## 2015-06-06 DIAGNOSIS — M5416 Radiculopathy, lumbar region: Secondary | ICD-10-CM

## 2015-06-06 DIAGNOSIS — L03011 Cellulitis of right finger: Secondary | ICD-10-CM

## 2015-06-06 MED ORDER — SULFAMETHOXAZOLE-TRIMETHOPRIM 800-160 MG PO TABS: ORAL_TABLET | ORAL | Status: DC

## 2015-06-06 NOTE — Progress Notes (Signed)
She presents today for follow-up of her questionable osteomyelitis to the hallux right. She states this seems to be doing better in that it is not painful nor is it red much. She continues to soak occasionally and has just finished up around of Septra.  Objective: Pulses remain palpable proximally. No erythema edema saline as drainage or odor to the fibular border of the hallux right. Though there is some dried drainage that is present. It is sort of a yellow gold color. There is no pain on palpation of this area. MRI was questionable initially for osteomyelitis at the tuft of the toe. I had the MRI reread demonstrating no signs of osteomyelitis to the tuft of the toe or to the proximal portion of the distal phalanx.  Assessment: Paronychia chronic in nature cannot rule out osteomyelitis 100%.  Plan: Reordered her Septra and she will continue all conservative therapies I will follow up with her in 3-4 weeks. She will start the Septra 2 pills twice daily 1 week then one peel twice daily until gone.

## 2015-06-07 ENCOUNTER — Ambulatory Visit (HOSPITAL_COMMUNITY)
Admission: RE | Admit: 2015-06-07 | Discharge: 2015-06-07 | Disposition: A | Discharge status: Home / Self Care | Payer: Commercial Managed Care - HMO | Source: Ambulatory Visit | Attending: Internal Medicine | Admitting: Internal Medicine

## 2015-06-07 ENCOUNTER — Ambulatory Visit (HOSPITAL_COMMUNITY): Payer: Commercial Managed Care - HMO

## 2015-06-07 DIAGNOSIS — R200 Anesthesia of skin: Secondary | ICD-10-CM | POA: Insufficient documentation

## 2015-06-07 DIAGNOSIS — M5416 Radiculopathy, lumbar region: Secondary | ICD-10-CM | POA: Diagnosis not present

## 2015-06-07 DIAGNOSIS — M5127 Other intervertebral disc displacement, lumbosacral region: Secondary | ICD-10-CM | POA: Diagnosis not present

## 2015-06-07 DIAGNOSIS — M4806 Spinal stenosis, lumbar region: Secondary | ICD-10-CM | POA: Diagnosis not present

## 2015-06-20 ENCOUNTER — Encounter: Payer: Self-pay | Admitting: Podiatry

## 2015-06-20 ENCOUNTER — Other Ambulatory Visit: Payer: Self-pay | Admitting: Podiatry

## 2015-06-20 NOTE — Telephone Encounter (Signed)
Refill request Norvasc.

## 2015-06-21 DIAGNOSIS — N3000 Acute cystitis without hematuria: Secondary | ICD-10-CM | POA: Diagnosis not present

## 2015-06-24 NOTE — Telephone Encounter (Signed)
-----  Message from Garrel Ridgel, [place] sent at 06/24/2015  8:22 AM EDT ----- Regarding: RE: rx refill Yes that will be great. ----- Message -----    From: Andres Ege, RN    Sent: 06/24/2015   8:18 AM      To: Garrel Ridgel, DPM Subject: rx refill                                      Dr. Milinda Pointer, do you want me to refill the Norvasc for this pt?  Marcy Siren

## 2015-07-03 DIAGNOSIS — M549 Dorsalgia, unspecified: Secondary | ICD-10-CM | POA: Diagnosis not present

## 2015-07-03 DIAGNOSIS — Z6834 Body mass index (BMI) 34.0-34.9, adult: Secondary | ICD-10-CM | POA: Diagnosis not present

## 2015-07-03 DIAGNOSIS — M5416 Radiculopathy, lumbar region: Secondary | ICD-10-CM | POA: Diagnosis not present

## 2015-07-04 ENCOUNTER — Ambulatory Visit: Payer: Commercial Managed Care - HMO | Admitting: Podiatry

## 2015-07-05 DIAGNOSIS — Z6836 Body mass index (BMI) 36.0-36.9, adult: Secondary | ICD-10-CM | POA: Diagnosis not present

## 2015-07-05 DIAGNOSIS — M5416 Radiculopathy, lumbar region: Secondary | ICD-10-CM | POA: Diagnosis not present

## 2015-07-12 DIAGNOSIS — M47816 Spondylosis without myelopathy or radiculopathy, lumbar region: Secondary | ICD-10-CM | POA: Diagnosis not present

## 2015-07-12 DIAGNOSIS — M5416 Radiculopathy, lumbar region: Secondary | ICD-10-CM | POA: Diagnosis not present

## 2015-07-30 DIAGNOSIS — M1711 Unilateral primary osteoarthritis, right knee: Secondary | ICD-10-CM | POA: Diagnosis not present

## 2015-07-30 DIAGNOSIS — Z96652 Presence of left artificial knee joint: Secondary | ICD-10-CM | POA: Diagnosis not present

## 2015-07-30 DIAGNOSIS — Z471 Aftercare following joint replacement surgery: Secondary | ICD-10-CM | POA: Diagnosis not present

## 2015-08-14 DIAGNOSIS — Z6833 Body mass index (BMI) 33.0-33.9, adult: Secondary | ICD-10-CM | POA: Diagnosis not present

## 2015-08-14 DIAGNOSIS — M5416 Radiculopathy, lumbar region: Secondary | ICD-10-CM | POA: Diagnosis not present

## 2015-08-22 DIAGNOSIS — J8403 Idiopathic pulmonary hemosiderosis: Secondary | ICD-10-CM | POA: Diagnosis not present

## 2015-08-22 DIAGNOSIS — M25561 Pain in right knee: Secondary | ICD-10-CM | POA: Diagnosis not present

## 2015-08-22 DIAGNOSIS — M349 Systemic sclerosis, unspecified: Secondary | ICD-10-CM | POA: Diagnosis not present

## 2015-09-09 DIAGNOSIS — Z23 Encounter for immunization: Secondary | ICD-10-CM | POA: Diagnosis not present

## 2015-09-20 DIAGNOSIS — Z0100 Encounter for examination of eyes and vision without abnormal findings: Secondary | ICD-10-CM | POA: Diagnosis not present

## 2015-10-04 ENCOUNTER — Telehealth: Payer: Self-pay | Admitting: Cardiology

## 2015-10-04 NOTE — Telephone Encounter (Signed)
error

## 2015-11-07 ENCOUNTER — Other Ambulatory Visit: Payer: Self-pay | Admitting: Cardiology

## 2015-11-07 NOTE — Telephone Encounter (Signed)
REFILL 

## 2016-01-01 DIAGNOSIS — M25442 Effusion, left hand: Secondary | ICD-10-CM | POA: Diagnosis not present

## 2016-01-01 DIAGNOSIS — M349 Systemic sclerosis, unspecified: Secondary | ICD-10-CM | POA: Diagnosis not present

## 2016-01-01 DIAGNOSIS — R05 Cough: Secondary | ICD-10-CM | POA: Diagnosis not present

## 2016-01-01 DIAGNOSIS — M0609 Rheumatoid arthritis without rheumatoid factor, multiple sites: Secondary | ICD-10-CM | POA: Diagnosis not present

## 2016-01-01 DIAGNOSIS — J8403 Idiopathic pulmonary hemosiderosis: Secondary | ICD-10-CM | POA: Diagnosis not present

## 2016-01-01 DIAGNOSIS — M7662 Achilles tendinitis, left leg: Secondary | ICD-10-CM | POA: Diagnosis not present

## 2016-01-01 DIAGNOSIS — M25441 Effusion, right hand: Secondary | ICD-10-CM | POA: Diagnosis not present

## 2016-01-30 DIAGNOSIS — Z79899 Other long term (current) drug therapy: Secondary | ICD-10-CM | POA: Diagnosis not present

## 2016-01-30 DIAGNOSIS — J8403 Idiopathic pulmonary hemosiderosis: Secondary | ICD-10-CM | POA: Diagnosis not present

## 2016-01-30 DIAGNOSIS — M349 Systemic sclerosis, unspecified: Secondary | ICD-10-CM | POA: Diagnosis not present

## 2016-01-30 DIAGNOSIS — M0609 Rheumatoid arthritis without rheumatoid factor, multiple sites: Secondary | ICD-10-CM | POA: Diagnosis not present

## 2016-02-06 DIAGNOSIS — I7300 Raynaud's syndrome without gangrene: Secondary | ICD-10-CM | POA: Diagnosis not present

## 2016-02-06 DIAGNOSIS — M79642 Pain in left hand: Secondary | ICD-10-CM | POA: Diagnosis not present

## 2016-02-06 DIAGNOSIS — Z79899 Other long term (current) drug therapy: Secondary | ICD-10-CM | POA: Diagnosis not present

## 2016-02-06 DIAGNOSIS — R7989 Other specified abnormal findings of blood chemistry: Secondary | ICD-10-CM | POA: Diagnosis not present

## 2016-02-06 DIAGNOSIS — J849 Interstitial pulmonary disease, unspecified: Secondary | ICD-10-CM | POA: Diagnosis not present

## 2016-02-06 DIAGNOSIS — M349 Systemic sclerosis, unspecified: Secondary | ICD-10-CM | POA: Diagnosis not present

## 2016-02-06 DIAGNOSIS — M0570 Rheumatoid arthritis with rheumatoid factor of unspecified site without organ or systems involvement: Secondary | ICD-10-CM | POA: Diagnosis not present

## 2016-02-06 DIAGNOSIS — M79641 Pain in right hand: Secondary | ICD-10-CM | POA: Diagnosis not present

## 2016-02-11 ENCOUNTER — Ambulatory Visit (INDEPENDENT_AMBULATORY_CARE_PROVIDER_SITE_OTHER): Payer: Commercial Managed Care - HMO | Admitting: Cardiology

## 2016-02-11 ENCOUNTER — Telehealth: Payer: Self-pay | Admitting: Cardiology

## 2016-02-11 ENCOUNTER — Encounter: Payer: Self-pay | Admitting: Cardiology

## 2016-02-11 VITALS — BP 112/72 | HR 96 | Ht 63.0 in | Wt 178.0 lb

## 2016-02-11 DIAGNOSIS — R072 Precordial pain: Secondary | ICD-10-CM | POA: Diagnosis not present

## 2016-02-11 DIAGNOSIS — I10 Essential (primary) hypertension: Secondary | ICD-10-CM | POA: Diagnosis not present

## 2016-02-11 DIAGNOSIS — R002 Palpitations: Secondary | ICD-10-CM | POA: Diagnosis not present

## 2016-02-11 DIAGNOSIS — I5032 Chronic diastolic (congestive) heart failure: Secondary | ICD-10-CM | POA: Diagnosis not present

## 2016-02-11 MED ORDER — LOSARTAN POTASSIUM 50 MG PO TABS: 50.0000 mg | ORAL_TABLET | Freq: Every day | ORAL | Status: DC

## 2016-02-11 NOTE — Assessment & Plan Note (Signed)
Continue present dose of Lasix. Potassium and renal function monitored by primary care.

## 2016-02-11 NOTE — Patient Instructions (Signed)
Medication Instructions:   DECREASE LOSARTAN TO 50 MG ONCE DAILY= 1/2 OF THE 100 MG TABLET ONCE DAILY  Follow-Up:  Your physician wants you to follow-up in: [place] will receive a reminder letter in the mail two months in advance. If you don't receive a letter, please call our office to schedule the follow-up appointment.

## 2016-02-11 NOTE — Telephone Encounter (Signed)
Patient c/o palpitations since over the weekend - tachycardia 110s, BP fine Heat from time to time radiating on side of neck - feels like vision goes out Patient states she feels like she will pass out - has not passed out Patient complains of chest pain (7 of 10, center of chest, non-radiating, feels sore) and shortness of breath Difficult to sleep - pain in center of chest, felt like heart was racing  She does not want to go to the hospital.  Patient has not seen MD since 12/2014 Added patient on to MD open slot 02/11/16 @ 315pm

## 2016-02-11 NOTE — Telephone Encounter (Signed)
New Message  Patient c/o Palpitations:  High priority if patient c/o lightheadedness and shortness of breath.  1. How long have you been having palpitations? Over the weekend   2. Are you currently experiencing lightheadedness and shortness of breath? Yes. Not currently.but when she stands up part of her vision turs white  3. Have you checked your BP and heart rate? (document readings) Yes, its fine.   4. Are you experiencing any other symptoms? At times yes. Radiating heat from her neck and this is when she feels light headed. And pt states that she felt as if she would pass.  Pt is requesting a same day appt,

## 2016-02-11 NOTE — Assessment & Plan Note (Signed)
No exertional symptoms.Recent symptoms were atypical and no new ST changes. Previous catheterization showed no significant coronary disease. No plans for further ischemia evaluation at this point.

## 2016-02-11 NOTE — Progress Notes (Signed)
HPI: FU diastolic CHF; also with history of HTN, CREST/scleroderma and ILD. Echo (03/12/14): Mod LVH, EF 65-70%, no RWMA, Gr 1 DD, mild LAE, mild TR. PFTs 4/15 with FEV1 1.54 and DLCO 68% predicted. Chest CT 4/15 showed pulmonary fibrosis; hepatic steatosis with cirrhosis. LHC (03/15/14): RA 7, RV 43/7/9, PA 43/18 (mean 31), PCWP 25, CO 6.9, CI 3.45, no CAD. Placed on lasix. ABIs May 2016 normal. Since last seen, Patient has some dyspnea on exertion unchanged. No orthopnea, PND or pedal edema. Over the weekend she had palpitations as though "my heart was out of rhythm". There was associated chest tightness. She also notes dizziness with standing.  Current Outpatient Prescriptions  Medication Sig Dispense Refill  . acetaminophen (TYLENOL) 325 MG tablet Take 2 tablets (650 mg total) by mouth every 6 (six) hours as needed for mild pain (or Fever >/= 101). 40 tablet 0  . albuterol (PROVENTIL HFA;VENTOLIN HFA) 108 (90 BASE) MCG/ACT inhaler Inhale 2 puffs into the lungs every 6 (six) hours as needed for wheezing.     Marland Kitchen amLODipine (NORVASC) 2.5 MG tablet TAKE ONE TABLET BY MOUTH ONCE DAILY 30 tablet 0  . aspirin EC 81 MG tablet Take 81 mg by mouth daily.    Marland Kitchen azelastine (ASTELIN) 137 MCG/SPRAY nasal spray Place 2 sprays into the nose 2 (two) times daily as needed for allergies. Use in each nostril as directed    . fexofenadine-pseudoephedrine (ALLEGRA-D) 60-120 MG per tablet Take 1 tablet by mouth as needed (for allergies).     . furosemide (LASIX) 40 MG tablet TAKE 1 TABLET TWICE DAILY 180 tablet 3  . hydroxychloroquine (PLAQUENIL) 200 MG tablet Take 200 mg by mouth 2 (two) times daily.    Marland Kitchen leflunomide (ARAVA) 20 MG tablet Take 20 mg by mouth daily.    Marland Kitchen losartan (COZAAR) 100 MG tablet Take 100 mg by mouth daily.    . nitroGLYCERIN (NITRODUR - DOSED IN MG/24 HR) 0.2 mg/hr patch Place 1 patch (0.2 mg total) onto the skin daily. 30 patch 12  . pantoprazole (PROTONIX) 40 MG tablet Take 40 mg by mouth  2 (two) times daily.    . predniSONE (DELTASONE) 5 MG tablet Take 10 mg by mouth every morning.     . zolpidem (AMBIEN) 10 MG tablet Take 10 mg by mouth at bedtime as needed for sleep.     No current facility-administered medications for this visit.     Past Medical History  Diagnosis Date  . Systemic sclerosis ([place])   . Osteoarthritis   . Seasonal allergies   . Interstitial lung disease ([place])   . Hx of cholecystectomy   . Rheumatoid arthritis(714.0)   . Raynaud disease   . Hypertension   . Pulmonary fibrosis ([place])   . Diastolic congestive heart failure ([place])     02/05/15  states no fluid in extremities, breathing same as has been  . Shortness of breath dyspnea     states normal for her  . GERD (gastroesophageal reflux disease)   . History of blood transfusion     Past Surgical History  Procedure Laterality Date  . Vesicovaginal fistula closure w/ tah  1987  . Bladder suspension    . Carpal tunnel release    . Laparoscopic cholecystectomy    . Abdominal hysterectomy    . Tonsillectomy    . Left heart catheterization with coronary/graft angiogram  03/15/2014    Procedure: LEFT HEART CATHETERIZATION WITH Beatrix Fetters;  Surgeon: Harrell Gave  Santina Evans, MD;  Location: [facility] CATH [facility];  Service: Cardiovascular;;  . Total knee arthroplasty Left 02/11/2015    Procedure: LEFT TOTAL KNEE ARTHROPLASTY, RIGHT KNEE CORTISONE INJECTION;  Surgeon: Gaynelle Arabian, MD;  Location: [facility] OR[facility];  Service: Orthopedics;  Laterality: Left;    Social History   Social History  . Marital Status: Married    Spouse Name: N/A  . Number of Children: 3  . Years of Education: N/A   Occupational History  . Nurse   .     Social History Main Topics  . Smoking status: Former Smoker -- 1.00 packs/day for 20 years    Types: Cigarettes    Quit date: 11/30/1988  . Smokeless tobacco: Never Used  . Alcohol Use: No  . Drug Use: No  . Sexual Activity: Not on file   Other Topics Concern  . Not on  file   Social History Narrative    Family History  Problem Relation Age of Onset  . Stroke Mother   . Heart attack Father     Died of MI at age 65  . Dementia Brother   . Osteoarthritis    . Celiac disease    . Emphysema Father   . Heart disease Mother     CABG at age 74    ROS: no fevers or chills, productive cough, hemoptysis, dysphasia, odynophagia, melena, hematochezia, dysuria, hematuria, rash, seizure activity, orthopnea, PND, pedal edema, claudication. Remaining systems are negative.  Physical Exam: Well-developed well-nourished in no acute distress.  Skin is warm and dry.  HEENT is normal.  Neck is supple.  Chest is clear to auscultation with normal expansion.  Cardiovascular exam is regular rate and rhythm.  Abdominal exam nontender or distended. No masses palpated. Extremities show no edema. neuro grossly intact  ECG Sinus rhythm with occasional PAC. No ST change.

## 2016-02-11 NOTE — Assessment & Plan Note (Signed)
Episode of palpitations over the weekend. We will plan an event monitor in the future if symptoms worsen.

## 2016-02-11 NOTE — Assessment & Plan Note (Signed)
Patient is having problems with dizziness. This may be related to lower blood pressure. Decrease Cozaar to 50 mg daily and follow.

## 2016-03-03 DIAGNOSIS — M25442 Effusion, left hand: Secondary | ICD-10-CM | POA: Diagnosis not present

## 2016-03-03 DIAGNOSIS — M545 Low back pain: Secondary | ICD-10-CM | POA: Diagnosis not present

## 2016-03-03 DIAGNOSIS — M349 Systemic sclerosis, unspecified: Secondary | ICD-10-CM | POA: Diagnosis not present

## 2016-03-03 DIAGNOSIS — M0570 Rheumatoid arthritis with rheumatoid factor of unspecified site without organ or systems involvement: Secondary | ICD-10-CM | POA: Diagnosis not present

## 2016-03-03 DIAGNOSIS — I959 Hypotension, unspecified: Secondary | ICD-10-CM | POA: Diagnosis not present

## 2016-03-03 DIAGNOSIS — M25441 Effusion, right hand: Secondary | ICD-10-CM | POA: Diagnosis not present

## 2016-03-04 ENCOUNTER — Encounter (HOSPITAL_COMMUNITY): Payer: Self-pay

## 2016-04-02 DIAGNOSIS — M0570 Rheumatoid arthritis with rheumatoid factor of unspecified site without organ or systems involvement: Secondary | ICD-10-CM | POA: Diagnosis not present

## 2016-04-02 DIAGNOSIS — Z79899 Other long term (current) drug therapy: Secondary | ICD-10-CM | POA: Diagnosis not present

## 2016-04-08 DIAGNOSIS — M0570 Rheumatoid arthritis with rheumatoid factor of unspecified site without organ or systems involvement: Secondary | ICD-10-CM | POA: Diagnosis not present

## 2016-04-08 DIAGNOSIS — M25442 Effusion, left hand: Secondary | ICD-10-CM | POA: Diagnosis not present

## 2016-04-08 DIAGNOSIS — I7300 Raynaud's syndrome without gangrene: Secondary | ICD-10-CM | POA: Diagnosis not present

## 2016-04-08 DIAGNOSIS — R7989 Other specified abnormal findings of blood chemistry: Secondary | ICD-10-CM | POA: Diagnosis not present

## 2016-04-08 DIAGNOSIS — M25441 Effusion, right hand: Secondary | ICD-10-CM | POA: Diagnosis not present

## 2016-04-08 DIAGNOSIS — M349 Systemic sclerosis, unspecified: Secondary | ICD-10-CM | POA: Diagnosis not present

## 2016-04-08 DIAGNOSIS — Z79899 Other long term (current) drug therapy: Secondary | ICD-10-CM | POA: Diagnosis not present

## 2016-04-17 DIAGNOSIS — L03019 Cellulitis of unspecified finger: Secondary | ICD-10-CM | POA: Diagnosis not present

## 2016-04-30 ENCOUNTER — Encounter: Payer: Self-pay | Admitting: Podiatry

## 2016-04-30 ENCOUNTER — Ambulatory Visit (INDEPENDENT_AMBULATORY_CARE_PROVIDER_SITE_OTHER): Payer: Commercial Managed Care - HMO | Admitting: Podiatry

## 2016-04-30 DIAGNOSIS — M65272 Calcific tendinitis, left ankle and foot: Secondary | ICD-10-CM

## 2016-04-30 NOTE — Progress Notes (Signed)
She presents today concerned about a mole nodule and posterior aspect of her Achilles left that has been present for some time. She states it seems to come and go as does the tenderness. She would also like for me to take a look at her toenails were we had a hard time healing A wound to the proximal fibular border of the hallux right.  Objective: Vital signs are stable alert and oriented 3 pulses are strong and palpable bilateral. Her toenails are growing out perfectly everything looks very nice varus E no signs of infection. The thickening of the Achilles tendon appears to be an old area possibly a tear of the Achilles tendon left. I can feel the margins all way down they feel intact, still small nodule which could very well be an area of fibrosis or even a cholesterol plaque. I do not feel that this warrants an MRI at this point due to the inconsistent pain that she experiences and there is no swelling no warmth no signs of infection.  Assessment: A history of recurrent tendinitis left.  Plan: Follow up with me on an as-needed basis.

## 2016-05-08 ENCOUNTER — Other Ambulatory Visit (HOSPITAL_COMMUNITY): Payer: Self-pay | Admitting: Internal Medicine

## 2016-05-08 ENCOUNTER — Ambulatory Visit (HOSPITAL_COMMUNITY)
Admission: RE | Admit: 2016-05-08 | Discharge: 2016-05-08 | Disposition: A | Discharge status: Home / Self Care | Payer: Commercial Managed Care - HMO | Source: Ambulatory Visit | Attending: Internal Medicine | Admitting: Internal Medicine

## 2016-05-08 DIAGNOSIS — R05 Cough: Secondary | ICD-10-CM | POA: Insufficient documentation

## 2016-05-08 DIAGNOSIS — J189 Pneumonia, unspecified organism: Secondary | ICD-10-CM | POA: Diagnosis not present

## 2016-05-08 DIAGNOSIS — R059 Cough, unspecified: Secondary | ICD-10-CM

## 2016-05-11 ENCOUNTER — Ambulatory Visit: Payer: Commercial Managed Care - HMO | Admitting: Pulmonary Disease

## 2016-05-18 DIAGNOSIS — L98499 Non-pressure chronic ulcer of skin of other sites with unspecified severity: Secondary | ICD-10-CM | POA: Diagnosis not present

## 2016-05-18 DIAGNOSIS — M0570 Rheumatoid arthritis with rheumatoid factor of unspecified site without organ or systems involvement: Secondary | ICD-10-CM | POA: Diagnosis not present

## 2016-05-18 DIAGNOSIS — Z79899 Other long term (current) drug therapy: Secondary | ICD-10-CM | POA: Diagnosis not present

## 2016-05-18 DIAGNOSIS — I7300 Raynaud's syndrome without gangrene: Secondary | ICD-10-CM | POA: Diagnosis not present

## 2016-05-18 DIAGNOSIS — M349 Systemic sclerosis, unspecified: Secondary | ICD-10-CM | POA: Diagnosis not present

## 2016-06-15 ENCOUNTER — Telehealth: Payer: Self-pay | Admitting: Pulmonary Disease

## 2016-06-15 DIAGNOSIS — J8410 Pulmonary fibrosis, unspecified: Secondary | ICD-10-CM

## 2016-06-15 NOTE — Telephone Encounter (Signed)
Sure, see if we can do it at [facility]

## 2016-06-15 NOTE — Telephone Encounter (Signed)
Spoke with pt, states she always has a PFT with her yearly OV, wants to know if we can order a PFT before her Friday appt with BQ.  Pt is a former [facility] pt transferring care.  There are no openings on PFT schedule in office.  Pt states she lives far away and has difficulty coming in to [place], would like to get this done in one day if possible.  BQ ok to try and order one at the hospital?  Thanks!

## 2016-06-16 NOTE — Telephone Encounter (Signed)
Called over to [facility] to schedule PFT. appt scheduled for Friday at 3pm. Pt needs to arrive at 2:45. Called spoke with pt and made aware. Nothing further needed

## 2016-06-19 ENCOUNTER — Encounter (HOSPITAL_COMMUNITY): Payer: Commercial Managed Care - HMO

## 2016-06-19 ENCOUNTER — Encounter: Payer: Self-pay | Admitting: Pulmonary Disease

## 2016-06-19 ENCOUNTER — Ambulatory Visit (INDEPENDENT_AMBULATORY_CARE_PROVIDER_SITE_OTHER): Payer: Commercial Managed Care - HMO | Admitting: Pulmonary Disease

## 2016-06-19 VITALS — BP 122/68 | HR 74 | Ht 63.0 in | Wt 190.0 lb

## 2016-06-19 DIAGNOSIS — M069 Rheumatoid arthritis, unspecified: Secondary | ICD-10-CM | POA: Diagnosis not present

## 2016-06-19 DIAGNOSIS — I272 Other secondary pulmonary hypertension: Secondary | ICD-10-CM

## 2016-06-19 DIAGNOSIS — J8410 Pulmonary fibrosis, unspecified: Secondary | ICD-10-CM | POA: Diagnosis not present

## 2016-06-19 DIAGNOSIS — M3481 Systemic sclerosis with lung involvement: Secondary | ICD-10-CM

## 2016-06-19 NOTE — Progress Notes (Signed)
Subjective:    Patient ID: [name], female    DOB: 12/29/1945, 70 y.o.   MRN: [id number]   Synopsis: Former patient of Dr. Lyda Jester who has a history of rheumatoid arthritis and systemic sclerosis. She was noted and it least 2009 to have pulmonary fibrosis. She was followed initially at [facility] rheumatology and pulmonary clinics transferred her care to physicians in [place]. She has been treated with Humira which controlled her joint symptoms significantly. She retired in 2014 inserted. Since some dyspnea afterwards. She had a right heart cath in April 2015 which showed the following: Hemodynamic Findings:  Ao: 139/66  LV: 149/10/20  RA: 7  RV: 43/7/9  PA: 43/18 (mean 31)  PCWP: 25  Fick Cardiac Output: 6.9 L/min  Fick Cardiac Index: 3.45 L/min/m2  Central Aortic Saturation: 93%  Pulmonary Artery Saturation: 72%  Dr. Joya Gaskins summarize the workup for her pulmonary fibrosis as follows: Pulm fibrosis presumably d/t Scleroderma No esophageal involvement. + raynaud, sclerodactyly, telangiectasias,  pulm fibrosis.  No evident Pulm HTN at last check in 2010.  Last CT chest and PFT 10/12 at [facility]  DLCO 50% 10/12 PFT 07/07/2012:  TLC 60%  DLCO 52%   CT Chest 07/07/2012: mild progression of pulmonary fibrosis compared to prior films Echo 07/07/2012  No pulm HTN Rheum: Dr Alanda Amass at [facility] PFTs 09/05/13:  FeV1 74% FVC 69% FeV1/FVC 82% TLC 63% DLCO 68%   PFTs 03/08/2014: DLCO 68%  HPI Chief Complaint  Patient presents with  . Follow-up    Pt c/o increased SOB and cough with exertion. Pt also reports occasional wheeze. Pt denies CP/tightness.     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 She was first diagnosed with systemic sclerosis over 10 years ago. She was  initially treated at [facility] with pulmonary clinic as well as rheumatology clinic. She says that she's been aware of pulmonary involvement since early on in the diagnosis. She never really experience significant dyspnea until she retired approximately 3 years ago in 2014. Since then she says that she's had some progressive dyspnea on minimal exertion but she has a hard time deciding if it's related to her lungs or not. She says that during this time she's been progressively more sedentary and she knows that she's getting out of shape more she's had bilateral knee pain which she says has limited her ability to move around.  In the last year or 2 she's had significant increase in symptoms from her rheumatoid arthritis. Specifically, she's had increasing joint aches and pains in her hands. Her rheumatology doctor has changed her medications to [place] recently which she says is not completely suppressed her symptoms. She's currently taking 5 mg of prednisone daily.  She had a bout of pneumonia approximately 2 months ago which was successfully treated with Levaquin. She says her symptoms have improved significant since then. She notes that she has a cough throughout the year which she associates with her postnasal drip. She does not have problems with trouble swallowing.  Past Medical History  Diagnosis Date  . Systemic sclerosis ([place])   . Osteoarthritis   . Seasonal allergies   . Interstitial lung disease ([place])   . Hx of cholecystectomy   . Rheumatoid arthritis(714.0)   . Raynaud disease   . Hypertension   .  Pulmonary fibrosis ([place])   . Diastolic congestive heart failure ([place])     02/05/15  states no fluid in extremities, breathing same as has been  . Shortness of breath dyspnea     states normal for her  . GERD (gastroesophageal reflux disease)   . History of blood transfusion       Review of Systems  Constitutional: Negative for fever, chills and fatigue.  HENT: Positive for postnasal  drip and rhinorrhea. Negative for sinus pressure.   Respiratory: Positive for cough and shortness of breath. Negative for wheezing.   Cardiovascular: Negative for chest pain, palpitations and leg swelling.       Objective:   Physical Exam Filed Vitals:   06/19/16 1649  BP: 122/68  Pulse: 74  Height: _0  (1.6 m)  Weight: 190 lb (86.183 kg)  SpO2: 96%  RA  Gen: well appearing, no acute distress HENT: NCAT, OP clear, neck supple without masses Eyes: PERRL, EOMi Lymph: no cervical lymphadenopathy PULM: Crackles bases Bilaterally CV: RRR, no mgr, no JVD GI: BS+, soft, nontender, no hsm Derm: no rash or skin breakdown MSK: normal bulk and tone Neuro: A&Ox4, CN II-XII intact, strength 5/5 in all 4 extremities Psyche: normal mood and affect   Images from her CT chest from 2015 were personally reviewed. She has right middle lobe and lingular traction bronchiolectasis with surrounding interstitial changes but there is no clear honeycombing. She also has peripheral predominant interlobular septal thickening but again no clear honeycombing which is worse in the bases.  Records from my partner Dr. Joya Gaskins were reviewed were he treated her for interstitial lung disease.     Assessment & Plan:  Pulmonary fibrosis ([place]) [name] has interstitial lung disease associated with systemic sclerosis and rheumatoid arthritis. Fortunately, she said little evidence of progression in radiographic changes and diffusion capacity over the years.  In the last several year she's experiencing increasing shortness of breath however, notes difficult to tell if this is related to her lung disease or deconditioning. I favor deconditioning as her lung function testing has remained stable. However, she has not had lung function testing in quite some time so we need to repeat that now with a 6 minute walk test.  Patients who have systemic sclerosis associated interstitial lung disease do well with CellCept, however I  don't think that that would be very effective for her joint disease.  Plan: From a lung standpoint she does not need treatment However, if her rheumatology team feels that she needs to go back on Humira (which worked quite well for her in the past) I'm fine by this. It certainly is associated with an increased risk of pulmonary infection so I think she should be on Bactrim when taking this medicine, but I do not think that it is absolutely contraindicated. Continue surveillance with pulmonary function testing in 6 minute walk Follow-up 3 months  Pulmonary hypertension ([place]) I saw her for pulmonary hypertension in 2015. She has [place] group 3 and grew to disease.  That said, patients with systemic sclerosis are at markedly increased risk for pulmonary arteriopathy. If she continues to have dyspnea despite progressive exercise weight loss and adequate volume control then we may need to consider repeat echocardiogram or right heart catheterization.  Chronic rheumatic arthritis ([place]) As noted above, I'm comfortable with her taking Humira if rheumatology feels this is appropriate.     Current outpatient prescriptions:  .  acetaminophen (TYLENOL) 325 MG tablet, Take 2 tablets (650 mg  total) by mouth every 6 (six) hours as needed for mild pain (or Fever >/= 101)., Disp: 40 tablet, Rfl: 0 .  albuterol (PROVENTIL HFA;VENTOLIN HFA) 108 (90 BASE) MCG/ACT inhaler, Inhale 2 puffs into the lungs every 6 (six) hours as needed for wheezing. , Disp: , Rfl:  .  amLODipine (NORVASC) 2.5 MG tablet, TAKE ONE TABLET BY MOUTH ONCE DAILY, Disp: 30 tablet, Rfl: 0 .  aspirin EC 81 MG tablet, Take 81 mg by mouth daily., Disp: , Rfl:  .  azelastine (ASTELIN) 137 MCG/SPRAY nasal spray, Place 2 sprays into the nose 2 (two) times daily as needed for allergies. Use in each nostril as directed, Disp: , Rfl:  .  fexofenadine-pseudoephedrine (ALLEGRA-D) 60-120 MG per tablet, Take 1 tablet by mouth as needed  (for allergies). , Disp: , Rfl:  .  furosemide (LASIX) 40 MG tablet, TAKE 1 TABLET TWICE DAILY, Disp: 180 tablet, Rfl: 3 .  hydroxychloroquine (PLAQUENIL) 200 MG tablet, Take 200 mg by mouth 2 (two) times daily., Disp: , Rfl:  .  leflunomide (ARAVA) 20 MG tablet, Take 50 mg by mouth daily. , Disp: , Rfl:  .  pantoprazole (PROTONIX) 40 MG tablet, Take 40 mg by mouth 2 (two) times daily., Disp: , Rfl:  .  predniSONE (DELTASONE) 5 MG tablet, Take 10 mg by mouth every morning. , Disp: , Rfl:  .  zolpidem (AMBIEN) 10 MG tablet, Take 10 mg by mouth at bedtime as needed for sleep., Disp: , Rfl:

## 2016-06-19 NOTE — Patient Instructions (Signed)
We will arrange for a lung function test in 6 minute walk here at our office I will see you back in 3 months or sooner if needed If your doctor decides to prescribed Humira, please call me so that we can make arrangements to start a prescription for Bactrim.

## 2016-06-19 NOTE — Assessment & Plan Note (Signed)
As noted above, I'm comfortable with her taking Humira if rheumatology feels this is appropriate.

## 2016-06-19 NOTE — Assessment & Plan Note (Signed)
I saw her for pulmonary hypertension in 2015. She has [place] group 3 and grew to disease.  That said, patients with systemic sclerosis are at markedly increased risk for pulmonary arteriopathy. If she continues to have dyspnea despite progressive exercise weight loss and adequate volume control then we may need to consider repeat echocardiogram or right heart catheterization.

## 2016-06-19 NOTE — Assessment & Plan Note (Addendum)
Tonica has interstitial lung disease associated with systemic sclerosis and rheumatoid arthritis. Fortunately, she said little evidence of progression in radiographic changes and diffusion capacity over the years.  In the last several year she's experiencing increasing shortness of breath however, notes difficult to tell if this is related to her lung disease or deconditioning. I favor deconditioning as her lung function testing has remained stable. However, she has not had lung function testing in quite some time so we need to repeat that now with a 6 minute walk test.  Patients who have systemic sclerosis associated interstitial lung disease do well with CellCept, however I don't think that that would be very effective for her joint disease.  Plan: From a lung standpoint she does not need treatment However, if her rheumatology team feels that she needs to go back on Humira (which worked quite well for her in the past) I'm fine by this. It certainly is associated with an increased risk of pulmonary infection so I think she should be on Bactrim when taking this medicine, but I do not think that it is absolutely contraindicated. Continue surveillance with pulmonary function testing in 6 minute walk Follow-up 3 months

## 2016-07-02 DIAGNOSIS — M1711 Unilateral primary osteoarthritis, right knee: Secondary | ICD-10-CM | POA: Diagnosis not present

## 2016-07-02 DIAGNOSIS — Z96652 Presence of left artificial knee joint: Secondary | ICD-10-CM | POA: Diagnosis not present

## 2016-07-02 DIAGNOSIS — Z471 Aftercare following joint replacement surgery: Secondary | ICD-10-CM | POA: Diagnosis not present

## 2016-07-09 DIAGNOSIS — Z79899 Other long term (current) drug therapy: Secondary | ICD-10-CM | POA: Diagnosis not present

## 2016-07-09 DIAGNOSIS — M0570 Rheumatoid arthritis with rheumatoid factor of unspecified site without organ or systems involvement: Secondary | ICD-10-CM | POA: Diagnosis not present

## 2016-07-09 DIAGNOSIS — I7300 Raynaud's syndrome without gangrene: Secondary | ICD-10-CM | POA: Diagnosis not present

## 2016-07-09 DIAGNOSIS — M349 Systemic sclerosis, unspecified: Secondary | ICD-10-CM | POA: Diagnosis not present

## 2016-07-16 DIAGNOSIS — Z79899 Other long term (current) drug therapy: Secondary | ICD-10-CM | POA: Diagnosis not present

## 2016-07-16 DIAGNOSIS — L98499 Non-pressure chronic ulcer of skin of other sites with unspecified severity: Secondary | ICD-10-CM | POA: Diagnosis not present

## 2016-07-16 DIAGNOSIS — J8403 Idiopathic pulmonary hemosiderosis: Secondary | ICD-10-CM | POA: Diagnosis not present

## 2016-07-16 DIAGNOSIS — M349 Systemic sclerosis, unspecified: Secondary | ICD-10-CM | POA: Diagnosis not present

## 2016-07-16 DIAGNOSIS — M0570 Rheumatoid arthritis with rheumatoid factor of unspecified site without organ or systems involvement: Secondary | ICD-10-CM | POA: Diagnosis not present

## 2016-07-16 DIAGNOSIS — D649 Anemia, unspecified: Secondary | ICD-10-CM | POA: Diagnosis not present

## 2016-07-17 DIAGNOSIS — Z79899 Other long term (current) drug therapy: Secondary | ICD-10-CM | POA: Diagnosis not present

## 2016-07-17 DIAGNOSIS — M0570 Rheumatoid arthritis with rheumatoid factor of unspecified site without organ or systems involvement: Secondary | ICD-10-CM | POA: Diagnosis not present

## 2016-07-17 DIAGNOSIS — M349 Systemic sclerosis, unspecified: Secondary | ICD-10-CM | POA: Diagnosis not present

## 2016-07-17 DIAGNOSIS — D649 Anemia, unspecified: Secondary | ICD-10-CM | POA: Diagnosis not present

## 2016-07-23 ENCOUNTER — Ambulatory Visit (INDEPENDENT_AMBULATORY_CARE_PROVIDER_SITE_OTHER): Payer: Commercial Managed Care - HMO

## 2016-07-23 ENCOUNTER — Ambulatory Visit (INDEPENDENT_AMBULATORY_CARE_PROVIDER_SITE_OTHER): Payer: Commercial Managed Care - HMO | Admitting: Podiatry

## 2016-07-23 DIAGNOSIS — M722 Plantar fascial fibromatosis: Secondary | ICD-10-CM | POA: Diagnosis not present

## 2016-07-23 DIAGNOSIS — G5761 Lesion of plantar nerve, right lower limb: Secondary | ICD-10-CM

## 2016-07-23 DIAGNOSIS — D3610 Benign neoplasm of peripheral nerves and autonomic nervous system, unspecified: Secondary | ICD-10-CM

## 2016-07-23 NOTE — Progress Notes (Signed)
She presents today with chief complaint of a pain to the lateral aspect of the right foot.  Objective: Vital signs are stable alert oriented 3 radiograph does not instrument any type of osseus 7 valleys. She has pain on palpation to the third digitof the right foot more than likely compensatory for her medial pain.  Assessment: Neuroma third interdigital space right foot. Injected Kenalog and local anesthetic to the area today.  Plan: Injected Kenalog into the area today will follow up with her in 3-4 weeks and have already discussed the possible need for dehydrated alcohol.

## 2016-07-24 ENCOUNTER — Telehealth: Payer: Self-pay | Admitting: Pulmonary Disease

## 2016-07-24 NOTE — Telephone Encounter (Signed)
Plan: From a lung standpoint she does not need treatment However, if her rheumatology team feels that she needs to go back on Humira (which worked quite well for her in the past) I'm fine by this. It certainly is associated with an increased risk of pulmonary infection so I think she should be on Bactrim when taking this medicine, but I do not think that it is absolutely contraindicated. Continue surveillance with pulmonary function testing in 6 minute walk Follow-up 3 months  This is per your last note in 05/2016.  Pt is calling and requesting an rx sent to [facility] mail order pharmacy.  BQ please advise dosage and quantity you would like to give. Thanks  Allergies  Allergen Reactions  . Codeine     Neurological problems - hallucinations, "spacey"

## 2016-07-28 NOTE — Telephone Encounter (Signed)
BQ please advise. thanks

## 2016-07-29 NOTE — Telephone Encounter (Signed)
One DS tab q MWF

## 2016-07-30 DIAGNOSIS — Z96652 Presence of left artificial knee joint: Secondary | ICD-10-CM | POA: Diagnosis not present

## 2016-07-30 DIAGNOSIS — M1711 Unilateral primary osteoarthritis, right knee: Secondary | ICD-10-CM | POA: Diagnosis not present

## 2016-07-30 DIAGNOSIS — Z471 Aftercare following joint replacement surgery: Secondary | ICD-10-CM | POA: Diagnosis not present

## 2016-07-30 MED ORDER — SULFAMETHOXAZOLE-TRIMETHOPRIM 800-160 MG PO TABS: 1.0000 | ORAL_TABLET | ORAL | 1 refills | Status: DC

## 2016-07-30 NOTE — Telephone Encounter (Signed)
Rx sent and pt made aware Nothing further needed per pt

## 2016-08-13 ENCOUNTER — Ambulatory Visit: Payer: Commercial Managed Care - HMO | Admitting: Podiatry

## 2016-08-21 DIAGNOSIS — D649 Anemia, unspecified: Secondary | ICD-10-CM | POA: Diagnosis not present

## 2016-08-21 DIAGNOSIS — M349 Systemic sclerosis, unspecified: Secondary | ICD-10-CM | POA: Diagnosis not present

## 2016-08-21 DIAGNOSIS — M0570 Rheumatoid arthritis with rheumatoid factor of unspecified site without organ or systems involvement: Secondary | ICD-10-CM | POA: Diagnosis not present

## 2016-08-21 DIAGNOSIS — Z79899 Other long term (current) drug therapy: Secondary | ICD-10-CM | POA: Diagnosis not present

## 2016-08-26 NOTE — Progress Notes (Signed)
Pt is being scheduled for preop appt; please place surgical orders in epic. Thanks.

## 2016-08-28 DIAGNOSIS — Z23 Encounter for immunization: Secondary | ICD-10-CM | POA: Diagnosis not present

## 2016-08-31 DIAGNOSIS — I5032 Chronic diastolic (congestive) heart failure: Secondary | ICD-10-CM | POA: Diagnosis not present

## 2016-08-31 DIAGNOSIS — J8410 Pulmonary fibrosis, unspecified: Secondary | ICD-10-CM | POA: Diagnosis not present

## 2016-08-31 DIAGNOSIS — M1711 Unilateral primary osteoarthritis, right knee: Secondary | ICD-10-CM | POA: Diagnosis not present

## 2016-09-04 ENCOUNTER — Ambulatory Visit (INDEPENDENT_AMBULATORY_CARE_PROVIDER_SITE_OTHER): Payer: Commercial Managed Care - HMO | Admitting: Physician Assistant

## 2016-09-04 ENCOUNTER — Encounter: Payer: Self-pay | Admitting: Physician Assistant

## 2016-09-04 VITALS — BP 138/72 | HR 78 | Ht 63.0 in | Wt 190.4 lb

## 2016-09-04 DIAGNOSIS — M341 CR(E)ST syndrome: Secondary | ICD-10-CM | POA: Diagnosis not present

## 2016-09-04 DIAGNOSIS — I10 Essential (primary) hypertension: Secondary | ICD-10-CM

## 2016-09-04 DIAGNOSIS — Z01810 Encounter for preprocedural cardiovascular examination: Secondary | ICD-10-CM | POA: Diagnosis not present

## 2016-09-04 DIAGNOSIS — J849 Interstitial pulmonary disease, unspecified: Secondary | ICD-10-CM

## 2016-09-04 NOTE — Patient Instructions (Addendum)
Medication Instructions:   Your physician recommends that you continue on your current medications as directed. Please refer to the Current Medication list given to you today.     If you need a refill on your cardiac medications before your next appointment, please call your pharmacy.  Labwork: NONE ORDER TODAY    Testing/Procedures: NONE ORDER TODAY    Follow-Up:  Your physician wants you to follow-up in: [place] will receive a reminder letter in the mail two months in advance. If you don't receive a letter, please call our office to schedule the follow-up appointment.     Any Other Special Instructions Will Be Listed Below (If Applicable).

## 2016-09-04 NOTE — Progress Notes (Addendum)
Cardiology Office Note    Date:  09/04/2016   ID:  [name], DOB 04-01-1946, MRN [id number]  PCP:  Asencion Noble, MD  Cardiologist:  Dr. Stanford Breed  Chief Complaint  Patient presents with  . Pre-op Exam    seen for Dr. Stanford Breed, upcoming surgery for R total knee arthroplasty by Dr. Wynelle Link    History of Present Illness:  [name] is a 70 y.o. female with PMH of HTN, CREST/scleroderma, and ILD. [name] last echocardiogram obtained in April 2015 showed moderate LVH, EF 65-70%, no RWMA, mild TR, mild LAE. PFT obtained in April 2015 showed FEV1 1.54, and DLCO 68% predicted. Chest CT obtained in April 2015 showed pulmonary fibrosis, hepatic steatosis with cirrhosis. She underwent left heart cath in April 2015 showed no CAD, RA 7, RV 43/7/9, PA 43/18 with mean of 31, wedge pressure 25, CO 6.9, CR 3.45. She was placed on Lasix. She had a repeat PFT on 02/08/2015 which showed minimal airway obstruction present, diffusion defect and a reduced lung volume suggests early parenchymal process. ABI obtained in May 2016 was normal. [name] last seen by Dr. Stanford Breed on 02/11/2016 at which time patient did have some dyspnea on exertion that was unchanged, however no orthopnea or PND. She also mentions she has some palpitation as though [name] heart was out of rhythm. [name] Cozaar was decreased to 50 mg daily after patient complains some dizziness. As for [name] palpitation, it was planned for event monitor in the future if symptom worsens.  She is planning for right total knee arthroplasty by Dr. Annye English on 10/05/2016 and presents today for preop clearance. She has been doing well since [name] last follow-up. She has some mild shortness of breath with exertion exertion That appears to be chronic. She denies any chest discomfort, she has no sign of heart failure including lower extremity edema, orthopnea or PND. She continued to have bluish discoloration in [name] toes especially during cold weather. EKG today showed no  significant changes.   Past Medical History:  Diagnosis Date  . Diastolic congestive heart failure ([place])    02/05/15  states no fluid in extremities, breathing same as has been  . GERD (gastroesophageal reflux disease)   . History of blood transfusion   . Hx of cholecystectomy   . Hypertension   . Interstitial lung disease ([place])   . Osteoarthritis   . Pulmonary fibrosis ([place])   . Raynaud disease   . Rheumatoid arthritis(714.0)   . Seasonal allergies   . Shortness of breath dyspnea    states normal for [name]  . Systemic sclerosis [facility])     Past Surgical History:  Procedure Laterality Date  . ABDOMINAL HYSTERECTOMY    . BLADDER SUSPENSION    . CARPAL TUNNEL RELEASE    . LAPAROSCOPIC CHOLECYSTECTOMY    . LEFT HEART CATHETERIZATION WITH CORONARY/GRAFT ANGIOGRAM  03/15/2014   Procedure: LEFT HEART CATHETERIZATION WITH Beatrix Fetters;  Surgeon: Burnell Blanks, MD;  Location: [facility] CATH LAB;  Service: Cardiovascular;;  . TONSILLECTOMY    . TOTAL KNEE ARTHROPLASTY Left 02/11/2015   Procedure: LEFT TOTAL KNEE ARTHROPLASTY, RIGHT KNEE CORTISONE INJECTION;  Surgeon: Gaynelle Arabian, MD;  Location: [facility] ORS;  Service: Orthopedics;  Laterality: Left;  Marland Kitchen VESICOVAGINAL FISTULA CLOSURE W/ TAH  1987    Current Medications: Outpatient Medications Prior to Visit  Medication Sig Dispense Refill  . acetaminophen (TYLENOL) 325 MG tablet Take 2 tablets (650 mg total) by mouth every 6 (six) hours as needed for  mild pain (or Fever >/= 101). 40 tablet 0  . albuterol (PROVENTIL HFA;VENTOLIN HFA) 108 (90 BASE) MCG/ACT inhaler Inhale 2 puffs into the lungs every 6 (six) hours as needed for wheezing.     Marland Kitchen aspirin EC 81 MG tablet Take 81 mg by mouth daily.    Marland Kitchen azelastine (ASTELIN) 137 MCG/SPRAY nasal spray Place 2 sprays into the nose 2 (two) times daily as needed for allergies. Use in each nostril as directed    . furosemide (LASIX) 40 MG tablet TAKE 1 TABLET TWICE DAILY 180 tablet 3  .  hydroxychloroquine (PLAQUENIL) 200 MG tablet Take 200 mg by mouth 2 (two) times daily.    . pantoprazole (PROTONIX) 40 MG tablet Take 40 mg by mouth 2 (two) times daily.    . predniSONE (DELTASONE) 5 MG tablet Take 10 mg by mouth every morning.     . sulfamethoxazole-trimethoprim (BACTRIM DS) 800-160 MG tablet Take 1 tablet by mouth every Monday, Wednesday, and Friday. 36 tablet 1  . zolpidem (AMBIEN) 10 MG tablet Take 10 mg by mouth at bedtime as needed for sleep.    Marland Kitchen amLODipine (NORVASC) 2.5 MG tablet TAKE ONE TABLET BY MOUTH ONCE DAILY 30 tablet 0  . fexofenadine-pseudoephedrine (ALLEGRA-D) 60-120 MG per tablet Take 1 tablet by mouth as needed (for allergies).     Marland Kitchen leflunomide (ARAVA) 20 MG tablet Take 50 mg by mouth daily.      No facility-administered medications prior to visit.      Allergies:   Codeine   Social History   Social History  . Marital status: Married    Spouse name: N/A  . Number of children: 3  . Years of education: N/A   Occupational History  . Nurse   .  [employer]   Social History Main Topics  . Smoking status: Former Smoker    Packs/day: 1.00    Years: 20.00    Types: Cigarettes    Quit date: 11/30/1988  . Smokeless tobacco: Never Used  . Alcohol use No  . Drug use: No  . Sexual activity: Not Asked   Other Topics Concern  . None   Social History Narrative  . None     Family History:  The patient's family history includes Dementia in [name] brother; Emphysema in [name] father; Heart attack in [name] father; Heart disease in [name] mother; Stroke in [name] mother.   ROS:   Please see the history of present illness.    ROS All other systems reviewed and are negative.   PHYSICAL EXAM:   VS:  BP 138/72   Pulse 78   Ht _0  (1.6 m)   Wt 190 lb 6.4 oz (86.4 kg)   BMI 33.73 kg/m    GEN: Well nourished, well developed, in no acute distress  HEENT: normal  Neck: no JVD, carotid bruits, or masses Cardiac: RRR; no murmurs, rubs, or  gallops,no edema  Respiratory:  clear to auscultation bilaterally, normal work of breathing GI: soft, nontender, nondistended, + BS MS: no deformity or atrophy  Skin: warm and dry, no rash. Bilateral toes mild bluish Neuro:  Alert and Oriented x 3, Strength and sensation are intact Psych: euthymic mood, full affect  Wt Readings from Last 3 Encounters:  09/04/16 190 lb 6.4 oz (86.4 kg)  06/19/16 190 lb (86.2 kg)  02/11/16 178 lb (80.7 kg)      Studies/Labs Reviewed:   EKG:  EKG is ordered today.  The ekg ordered today  demonstrates Normal sinus rhythm without significant ST-T wave changes.  Recent Labs: No results found for requested labs within last 8760 hours.   Lipid Panel No results found for: CHOL, TRIG, HDL, CHOLHDL, VLDL, LDLCALC, LDLDIRECT  Additional studies/ records that were reviewed today include:   Echo 03/12/2014 LV EF: 65% -  70%  ------------------------------------------------------------ Indications:   Shortness of breath 786.05.  ------------------------------------------------------------ History:  PMH: Scleroderma. Post inflammatory pulmonary fibrosis. Acquired from the patient and from the patient's chart. Dyspnea. Risk factors: Former tobacco use. Hypertension.  ------------------------------------------------------------ Study Conclusions  - Left ventricle: The cavity size was normal. There was moderate concentric hypertrophy. Systolic function was vigorous. The estimated ejection fraction was in the range of 65% to 70%. Wall motion was normal; there were no regional wall motion abnormalities. Doppler parameters are consistent with abnormal left ventricular relaxation (grade 1 diastolic dysfunction). There was no evidence of elevated ventricular filling pressure by Doppler parameters. - Aortic valve: No regurgitation. - Aortic root: The aortic root was normal in size. - Mitral valve: No regurgitation. - Left atrium:  The atrium was mildly dilated. - Right ventricle: Systolic function was normal. - Right atrium: The atrium was normal in size. - Tricuspid valve: Mild regurgitation. - Pulmonary arteries: Systolic pressure was within the normal range. - Pericardium, extracardiac: There was no pericardial effusion.   LE arterial doppler 04/17/2015   ASSESSMENT:    1. Preop cardiovascular exam   2. Essential hypertension   3. CREST variant of scleroderma ([place])   4. ILD (interstitial lung disease) ([place])      PLAN:  In order of problems listed above:  1. Preop clearance for R total knee arthroscopy  - Patient has no known coronary artery disease on the previous cardiac catheterization in 2015, she was placed on Lasix for elevated pressure on the previous right heart cath. Overall, [name] fluid status is very well-controlled. She has no significant lower extremity edema, no orthopnea or PND. She has no recent exertional type symptoms. She is cleared to pursue surgery without further cardiac workup.  2.    HTN: Blood pressure mildly elevated, but this is a solitary value, she is no longer on amlodipine 2.5 mg daily  3.    Scleroderma/CREST: In retrospective, amlodipine is likely for crest syndrome, I will check with the patient why she is no longer on amlodipine.  4.    ILD: She is pending annual PFT.    Medication Adjustments/Labs and Tests Ordered: Current medicines are reviewed at length with the patient today.  Concerns regarding medicines are outlined above.  Medication changes, Labs and Tests ordered today are listed in the Patient Instructions below. Patient Instructions  Medication Instructions:   Your physician recommends that you continue on your current medications as directed. Please refer to the Current Medication list given to you today.     If you need a refill on your cardiac medications before your next appointment, please call your pharmacy.  Labwork: NONE ORDER  TODAY    Testing/Procedures: NONE ORDER TODAY    Follow-Up:  Your physician wants you to follow-up in: [place] will receive a reminder letter in the mail two months in advance. If you don't receive a letter, please call our office to schedule the follow-up appointment.     Any Other Special Instructions Will Be Listed Below (If Applicable).  Hilbert Corrigan, [place]  09/04/2016 11:13 PM    [facility] [address], [place], [place]  [address] Phone: [phone]; Fax: [phone]

## 2016-09-06 ENCOUNTER — Ambulatory Visit: Payer: Self-pay | Admitting: Orthopedic Surgery

## 2016-09-08 NOTE — Addendum Note (Signed)
Addended by: Leland Johns A on: 09/08/2016 02:19 PM   Modules accepted: Orders

## 2016-09-09 ENCOUNTER — Telehealth: Payer: Self-pay | Admitting: Cardiology

## 2016-09-09 NOTE — Telephone Encounter (Signed)
[name] is calling because Almyra Deforest PA called her and lmsg that he wants her to go back on blood pressure medications, she was taking off of blood pressure medications because it makes her Hypotension . Please call if you have any questions   Thanks

## 2016-09-09 NOTE — Telephone Encounter (Signed)
I did contact [name] to inquire why she was off amlodipine as it suppose help with her arterial spasm issue from CREST syndrome, however she was not at home. I'm sorry I missed her phone call. I am surprised that the lowest dose of amlodipine is dropping her BP that much. But she has BP problem and her Raynaud's symptom is not causing too much discomfort, I am fine with her off of it.

## 2016-09-10 NOTE — Telephone Encounter (Signed)
Pt notified will call back if anything changes

## 2016-09-15 DIAGNOSIS — Z09 Encounter for follow-up examination after completed treatment for conditions other than malignant neoplasm: Secondary | ICD-10-CM | POA: Diagnosis not present

## 2016-09-15 DIAGNOSIS — H25813 Combined forms of age-related cataract, bilateral: Secondary | ICD-10-CM | POA: Diagnosis not present

## 2016-09-15 DIAGNOSIS — M059 Rheumatoid arthritis with rheumatoid factor, unspecified: Secondary | ICD-10-CM | POA: Diagnosis not present

## 2016-09-15 DIAGNOSIS — Z79899 Other long term (current) drug therapy: Secondary | ICD-10-CM | POA: Diagnosis not present

## 2016-09-15 DIAGNOSIS — H04123 Dry eye syndrome of bilateral lacrimal glands: Secondary | ICD-10-CM | POA: Diagnosis not present

## 2016-09-15 DIAGNOSIS — Z049 Encounter for examination and observation for unspecified reason: Secondary | ICD-10-CM | POA: Diagnosis not present

## 2016-09-15 DIAGNOSIS — H00024 Hordeolum internum left upper eyelid: Secondary | ICD-10-CM | POA: Diagnosis not present

## 2016-09-22 ENCOUNTER — Ambulatory Visit (INDEPENDENT_AMBULATORY_CARE_PROVIDER_SITE_OTHER): Payer: Commercial Managed Care - HMO | Admitting: Pulmonary Disease

## 2016-09-22 ENCOUNTER — Encounter (HOSPITAL_COMMUNITY): Payer: Self-pay

## 2016-09-22 ENCOUNTER — Encounter: Payer: Self-pay | Admitting: Pulmonary Disease

## 2016-09-22 DIAGNOSIS — R0602 Shortness of breath: Secondary | ICD-10-CM

## 2016-09-22 DIAGNOSIS — M05711 Rheumatoid arthritis with rheumatoid factor of right shoulder without organ or systems involvement: Secondary | ICD-10-CM | POA: Diagnosis not present

## 2016-09-22 DIAGNOSIS — J8410 Pulmonary fibrosis, unspecified: Secondary | ICD-10-CM | POA: Diagnosis not present

## 2016-09-22 DIAGNOSIS — M3481 Systemic sclerosis with lung involvement: Secondary | ICD-10-CM

## 2016-09-22 DIAGNOSIS — I272 Pulmonary hypertension, unspecified: Secondary | ICD-10-CM

## 2016-09-22 LAB — PULMONARY FUNCTION TEST
DL/VA % pred: 114 %
DL/VA: 5.18 ml/min/mmHg/L
DLCO COR % PRED: 73 %
DLCO COR: 15.87 ml/min/mmHg
DLCO unc % pred: 74 %
DLCO unc: 15.97 ml/min/mmHg
FEF 25-75 POST: 1.27 L/s
FEF 25-75 Pre: 1.04 L/sec
FEF2575-%Change-Post: 22 %
FEF2575-%PRED-PRE: 55 %
FEF2575-%Pred-Post: 67 %
FEV1-%Change-Post: 4 %
FEV1-%PRED-POST: 70 %
FEV1-%Pred-Pre: 67 %
FEV1-POST: 1.51 L
FEV1-Pre: 1.44 L
FEV1FVC-%CHANGE-POST: 5 %
FEV1FVC-%PRED-PRE: 97 %
FEV6-%Change-Post: 0 %
FEV6-%Pred-Post: 71 %
FEV6-%Pred-Pre: 71 %
FEV6-POST: 1.92 L
FEV6-Pre: 1.91 L
FEV6FVC-%Change-Post: 1 %
FEV6FVC-%PRED-POST: 104 %
FEV6FVC-%Pred-Pre: 103 %
FVC-%Change-Post: 0 %
FVC-%PRED-POST: 68 %
FVC-%PRED-PRE: 68 %
FVC-POST: 1.92 L
FVC-PRE: 1.94 L
PRE FEV1/FVC RATIO: 74 %
Post FEV1/FVC ratio: 79 %
Post FEV6/FVC ratio: 100 %
Pre FEV6/FVC Ratio: 99 %
RV % pred: 72 %
RV: 1.48 L
TLC % PRED: 71 %
TLC: 3.4 L

## 2016-09-22 NOTE — Assessment & Plan Note (Signed)
As detailed above 

## 2016-09-22 NOTE — Progress Notes (Signed)
Subjective:    Patient ID: [name], female    DOB: [birth year], 70 y.o.   MRN: [id number]   Synopsis: Former patient of Dr. Lyda Jester who has a history of rheumatoid arthritis and systemic sclerosis. She was noted and it least 2009 to have pulmonary fibrosis. She was followed initially at [facility] rheumatology and pulmonary clinics transferred her care to physicians in [place]. She has been treated with Humira which controlled her joint symptoms significantly. She retired in 2014 inserted. Since some dyspnea afterwards. She had a right heart cath in April 2015 which showed the following: Hemodynamic Findings:  Ao: 139/66  LV: 149/10/20  RA: 7  RV: 43/7/9  PA: 43/18 (mean 31)  PCWP: 25  Fick Cardiac Output: 6.9 L/min  Fick Cardiac Index: 3.45 L/min/m2  Central Aortic Saturation: 93%  Pulmonary Artery Saturation: 72%  Dr. Joya Gaskins summarize the workup for her pulmonary fibrosis as follows: Pulm fibrosis presumably d/t Scleroderma No esophageal involvement. + raynaud, sclerodactyly, telangiectasias,  pulm fibrosis.  No evident Pulm HTN at last check in 2010.  Last CT chest and PFT 10/12 at [facility]  DLCO 50% 10/12 PFT 07/07/2012:  TLC 60%  DLCO 52%   CT Chest 07/07/2012: mild progression of pulmonary fibrosis compared to prior films Echo 07/07/2012  No pulm HTN Rheum: Dr Alanda [facility]ass at [facility] PFTs 09/05/13:  FeV1 74% FVC 69% FeV1/FVC 82% TLC 63% DLCO 68%   PFTs 03/08/2014: DLCO 68% October 2017 pulmonary function testing ratio 79% FVC 1.92 L 68% predicted FEV1 1.51 L 70% predicted total lung capacity 3.40 L 71% predicted DLCO 15.97 74% predicted 08/2016 6MW 279 96% nadir O2  HPI Chief Complaint  Patient presents with  . Follow-up    review pft.  pt did not have 67m due to check-in error.     She is still taking humira regulalry and this helps. She says this really helps her joint disease  Dyspnea> has some with exertion but she can't really exert herself.  She thinks is  it related to her deconditioning.    She is planning to have a left total knee on November 2017 and would like clearance.  She has severe R knee arthritis.     Past Medical History:  Diagnosis Date  . Diastolic congestive heart failure (HMeta    02/05/15  states no fluid in extremities, breathing same as has been  . GERD (gastroesophageal reflux disease)   . History of blood transfusion   . Hx of cholecystectomy   . Hypertension   . Interstitial lung disease ([place]   . Osteoarthritis   . [place]monary fibrosis ([place]   . Raynaud disease   . Rheumatoid arthritis(714.0)   . Seasonal allergies   . Shortness of breath dyspnea    states normal for her  . Systemic sclerosis ([place]       Review of Systems  Constitutional: Negative for chills, fatigue and fever.  HENT: Positive for postnasal drip and rhinorrhea. Negative for sinus pressure.   Respiratory: Positive for cough and shortness of breath. Negative for wheezing.   Cardiovascular: Negative for chest pain, palpitations and leg swelling.       Objective:   Physical Exam There were no vitals filed for this visit.RA  6 minute walk vitals were reviewed today  Gen: well appearing, no acute distress HENT: NCAT, OP clear, neck supple without masses Eyes: PERRL, EOMi Lymph: no cervical lymphadenopathy PULM: Crackles bases Bilaterally CV: RRR, no mgr, no JVD GI: BS+, soft,  nontender, no hsm Derm: no rash or skin breakdown MSK: normal bulk and tone Neuro: A&Ox4, CN II-XII intact, strength 5/5 in all 4 extremities Psyche: normal mood and affect   Images from her CT chest from 2015 were personally reviewed. She has right middle lobe and lingular traction bronchiolectasis with surrounding interstitial changes but there is no clear honeycombing. She also has peripheral predominant interlobular septal thickening but again no clear honeycombing which is worse in the bases.  Records from my partner Dr. Joya Gaskins were reviewed were he treated  her for interstitial lung disease.     Assessment & Plan:  Chronic rheumatic arthritis ([place]) Continue current treatment as directed by her rheumatologist. As I see no evidence of progressive interstitial lung disease I have no indication today that this needs to be changed.  Pulmonary fibrosis ([place]) She has an ill-defined interstitial lung disease in the setting of known underlying scleroderma. Fortunately for her, her 6 minute walk distance today was great, her O2 saturation was normal, and her lung function testing has been stable if not subtly improving over the last several years. So I have no evidence that her interstitial lung disease is worsening.  Plan: No need to change autoimmune therapy based on today's testing She can proceed with knee replacement surgery On the day of knee replacement surgery she should get out of bed, use incentive spirometry Plan repeat pulmonary function testing in one year  Pulmonary hypertension ([place]) No evidence of this on the right heart catheterization from May 2015. As her diffusion capacity has been stable on lung function testing I see no reason to evaluate her for this problem further. I believe that her dyspnea is primarily related to deconditioning.  Lung disease with systemic sclerosis ([place]) As detailed above    Current Outpatient Prescriptions:  .  acetaminophen (TYLENOL) 325 MG tablet, Take 2 tablets (650 mg total) by mouth every 6 (six) hours as needed for mild pain (or Fever >/= 101)., Disp: 40 tablet, Rfl: 0 .  Adalimumab (HUMIRA [place]), 1 injection every other week., Disp: , Rfl:  .  albuterol (PROVENTIL HFA;VENTOLIN HFA) 108 (90 BASE) MCG/ACT inhaler, Inhale 2 puffs into the lungs every 6 (six) hours as needed for wheezing. , Disp: , Rfl:  .  aspirin EC 81 MG tablet, Take 81 mg by mouth daily., Disp: , Rfl:  .  azelastine (ASTELIN) 137 MCG/SPRAY nasal spray, Place 2 sprays into the nose 2 (two) times daily as needed for allergies. Use in  each nostril as directed, Disp: , Rfl:  .  cephALEXin (KEFLEX) 500 MG capsule, Take 500 mg by mouth 3 (three) times daily., Disp: , Rfl:  .  furosemide (LASIX) 40 MG tablet, TAKE 1 TABLET TWICE DAILY, Disp: 180 tablet, Rfl: 3 .  hydroxychloroquine (PLAQUENIL) 200 MG tablet, Take 200 mg by mouth 2 (two) times daily., Disp: , Rfl:  .  pantoprazole (PROTONIX) 40 MG tablet, Take 40 mg by mouth 2 (two) times daily., Disp: , Rfl:  .  predniSONE (DELTASONE) 5 MG tablet, Take 10 mg by mouth every morning. , Disp: , Rfl:  .  sulfamethoxazole-trimethoprim (BACTRIM DS) 800-160 MG tablet, Take 1 tablet by mouth every Monday, Wednesday, and Friday., Disp: 36 tablet, Rfl: 1 .  zolpidem (AMBIEN) 10 MG tablet, Take 10 mg by mouth at bedtime as needed for sleep., Disp: , Rfl:

## 2016-09-22 NOTE — Assessment & Plan Note (Signed)
No evidence of this on the right heart catheterization from May 2015. As her diffusion capacity has been stable on lung function testing I see no reason to evaluate her for this problem further. I believe that her dyspnea is primarily related to deconditioning.

## 2016-09-22 NOTE — Patient Instructions (Signed)
It is okay for you to have knee surgery On the day of surgery make sure you get out of bed as soon as they tell you and use the incident spirometer I will send a note to your orthopedic surgeon to make sure he knows this We'll see you back in 6 months

## 2016-09-22 NOTE — Assessment & Plan Note (Signed)
She has an ill-defined interstitial lung disease in the setting of known underlying scleroderma. Fortunately for her, her 6 minute walk distance today was great, her O2 saturation was normal, and her lung function testing has been stable if not subtly improving over the last several years. So I have no evidence that her interstitial lung disease is worsening.  Plan: No need to change autoimmune therapy based on today's testing She can proceed with knee replacement surgery On the day of knee replacement surgery she should get out of bed, use incentive spirometry Plan repeat pulmonary function testing in one year

## 2016-09-22 NOTE — Assessment & Plan Note (Signed)
Continue current treatment as directed by her rheumatologist. As I see no evidence of progressive interstitial lung disease I have no indication today that this needs to be changed.

## 2016-09-23 NOTE — Progress Notes (Signed)
PFT visit 6MW visit

## 2016-09-25 ENCOUNTER — Encounter (HOSPITAL_COMMUNITY)
Admission: RE | Admit: 2016-09-25 | Discharge: 2016-09-25 | Disposition: A | Discharge status: Home / Self Care | Payer: Commercial Managed Care - HMO | Source: Ambulatory Visit | Attending: Orthopedic Surgery | Admitting: Orthopedic Surgery

## 2016-09-25 ENCOUNTER — Encounter (HOSPITAL_COMMUNITY): Payer: Self-pay

## 2016-09-25 ENCOUNTER — Encounter (HOSPITAL_COMMUNITY): Payer: Self-pay | Admitting: *Deleted

## 2016-09-25 DIAGNOSIS — Z01812 Encounter for preprocedural laboratory examination: Secondary | ICD-10-CM | POA: Diagnosis not present

## 2016-09-25 LAB — COMPREHENSIVE METABOLIC PANEL
ALBUMIN: 4.4 g/dL (ref 3.5–5.0)
ALK PHOS: 75 U/L (ref 38–126)
ALT: 27 U/L (ref 14–54)
ANION GAP: 7 (ref 5–15)
AST: 34 U/L (ref 15–41)
BUN: 16 mg/dL (ref 6–20)
CALCIUM: 8.9 mg/dL (ref 8.9–10.3)
CO2: 31 mmol/L (ref 22–32)
CREATININE: 0.92 mg/dL (ref 0.44–1.00)
Chloride: 101 mmol/L (ref 101–111)
GFR calc Af Amer: >60 mL/min (ref >60)
GFR calc non Af Amer: >60 mL/min (ref >60)
GLUCOSE: 89 mg/dL (ref 65–99)
Potassium: 3.7 mmol/L (ref 3.5–5.1)
SODIUM: 139 mmol/L (ref 135–145)
Total Bilirubin: 0.6 mg/dL (ref 0.3–1.2)
Total Protein: 7.5 g/dL (ref 6.5–8.1)

## 2016-09-25 LAB — CBC
HCT: 42.3 % (ref 36.0–46.0)
HEMOGLOBIN: 14.6 g/dL (ref 12.0–15.0)
MCH: 32.8 pg (ref 26.0–34.0)
MCHC: 34.5 g/dL (ref 30.0–36.0)
MCV: 95.1 fL (ref 78.0–100.0)
Platelets: 275 10*3/uL (ref 150–400)
RBC: 4.45 MIL/uL (ref 3.87–5.11)
RDW: 12.6 % (ref 11.5–15.5)
WBC: 8.5 10*3/uL (ref 4.0–10.5)

## 2016-09-25 LAB — URINALYSIS, ROUTINE W REFLEX MICROSCOPIC
BILIRUBIN URINE: NEGATIVE
Glucose, UA: NEGATIVE mg/dL
HGB URINE DIPSTICK: NEGATIVE
KETONES UR: NEGATIVE mg/dL
Nitrite: NEGATIVE
PROTEIN: NEGATIVE mg/dL
Specific Gravity, Urine: 1.012 (ref 1.005–1.030)
pH: 5.5 (ref 5.0–8.0)

## 2016-09-25 LAB — PROTIME-INR
INR: 1.05
Prothrombin Time: 13.8 seconds (ref 11.4–15.2)

## 2016-09-25 LAB — URINE MICROSCOPIC-ADD ON
BACTERIA UA: NONE SEEN
RBC / HPF: NONE SEEN RBC/hpf (ref 0–5)

## 2016-09-25 LAB — SURGICAL PCR SCREEN
MRSA, PCR: NEGATIVE
STAPHYLOCOCCUS AUREUS: NEGATIVE

## 2016-09-25 LAB — APTT: APTT: 37 s — AB (ref 24–36)

## 2016-09-25 NOTE — Patient Instructions (Addendum)
ANAYAH ARVANITIS  09/25/2016   Your procedure is scheduled on: 10/05/16  Report to [facility] Main  Entrance take [facility]  elevators to 3rd floor to  [place] at 7:30 AM.  Call this number if you have problems the morning of surgery [phone]   Remember: ONLY 1 PERSON MAY GO WITH YOU TO SHORT STAY TO GET  READY MORNING OF [place].  Do not eat food or drink liquids :After Midnight. Follow Dr. Anne Fu instructions regarding discontinuing Aspirin prior to surgery.     Take these medicines the morning of surgery with A SIP OF WATER: Pantoprazole (Protonix), Prednisone, Albuterol inhaler if needed, Astelin Nasal Spray if needed.                               You may not have any metal on your body including hair pins and              piercings  Do not wear jewelry, make-up, lotions, powders or perfumes, deodorant             Do not wear nail polish.  Do not shave  48 hours prior to surgery.              Men may shave face and neck.   Do not bring valuables to the hospital. [place].  Contacts, dentures or bridgework may not be worn into surgery.  Leave suitcase in the car. After surgery it may be brought to your room.               Please read over the following fact sheets you were given: _____________________________________________________________________ [facility] - Preparing for Surgery Before surgery, you can play an important role.  Because skin is not sterile, your skin needs to be as free of germs as possible.  You can reduce the number of germs on your skin by washing with CHG (chlorahexidine gluconate) soap before surgery.  CHG is an antiseptic cleaner which kills germs and bonds with the skin to continue killing germs even after washing. Please DO NOT use if you have an allergy to CHG or antibacterial soaps.  If your skin becomes reddened/irritated stop using the CHG and inform your  nurse when you arrive at Short Stay. Do not shave (including legs and underarms) for at least 48 hours prior to the first CHG shower.  You may shave your face/neck. Please follow these instructions carefully:  1.  Shower with CHG Soap the night before surgery and the  morning of Surgery.  2.  If you choose to wash your hair, wash your hair first as usual with your  normal  shampoo.  3.  After you shampoo, rinse your hair and body thoroughly to remove the  shampoo.                           4.  Use CHG as you would any other liquid soap.  You can apply chg directly  to the skin and wash                       Gently with a scrungie or clean washcloth.  5.  Apply the CHG Soap to your body ONLY FROM THE NECK DOWN.   Do not use on face/ open                           Wound or open sores. Avoid contact with eyes, ears mouth and genitals (private parts).                       Wash face,  Genitals (private parts) with your normal soap.             6.  Wash thoroughly, paying special attention to the area where your surgery  will be performed.  7.  Thoroughly rinse your body with warm water from the neck down.  8.  DO NOT shower/wash with your normal soap after using and rinsing off  the CHG Soap.                9.  Pat yourself dry with a clean towel.            10.  Wear clean pajamas.            11.  Place clean sheets on your bed the night of your first shower and do not  sleep with pets. Day of Surgery : Do not apply any lotions/deodorants the morning of surgery.  Please wear clean clothes to the hospital/surgery center.  FAILURE TO FOLLOW THESE INSTRUCTIONS MAY RESULT IN THE CANCELLATION OF YOUR SURGERY PATIENT SIGNATURE_________________________________  NURSE SIGNATURE__________________________________  ________________________________________________________________________   Adam Phenix  An incentive spirometer is a tool that can help keep your lungs clear and active. This tool  measures how well you are filling your lungs with each breath. Taking long deep breaths may help reverse or decrease the chance of developing breathing (pulmonary) problems (especially infection) following:  A long period of time when you are unable to move or be active. BEFORE THE PROCEDURE   If the spirometer includes an indicator to show your best effort, your nurse or respiratory therapist will set it to a desired goal.  If possible, sit up straight or lean slightly forward. Try not to slouch.  Hold the incentive spirometer in an upright position. INSTRUCTIONS FOR USE  1. Sit on the edge of your bed if possible, or sit up as far as you can in bed or on a chair. 2. Hold the incentive spirometer in an upright position. 3. Breathe out normally. 4. Place the mouthpiece in your mouth and seal your lips tightly around it. 5. Breathe in slowly and as deeply as possible, raising the piston or the ball toward the top of the column. 6. Hold your breath for 3-5 seconds or for as long as possible. Allow the piston or ball to fall to the bottom of the column. 7. Remove the mouthpiece from your mouth and breathe out normally. 8. Rest for a few seconds and repeat Steps 1 through 7 at least 10 times every 1-2 hours when you are awake. Take your time and take a few normal breaths between deep breaths. 9. The spirometer may include an indicator to show your best effort. Use the indicator as a goal to work toward during each repetition. 10. After each set of 10 deep breaths, practice coughing to be sure your lungs are clear. If you have an incision (the cut made at the time of surgery), support your incision when coughing by placing a pillow or  rolled up towels firmly against it. Once you are able to get out of bed, walk around indoors and cough well. You may stop using the incentive spirometer when instructed by your caregiver.  RISKS AND COMPLICATIONS  Take your time so you do not get dizzy or  light-headed.  If you are in pain, you may need to take or ask for pain medication before doing incentive spirometry. It is harder to take a deep breath if you are having pain. AFTER USE  Rest and breathe slowly and easily.  It can be helpful to keep track of a log of your progress. Your caregiver can provide you with a simple table to help with this. If you are using the spirometer at home, follow these instructions: [place] IF:   You are having difficultly using the spirometer.  You have trouble using the spirometer as often as instructed.  Your pain medication is not giving enough relief while using the spirometer.  You develop fever of 100.5 F (38.1 C) or higher. SEEK IMMEDIATE MEDICAL CARE IF:   You cough up bloody sputum that had not been present before.  You develop fever of 102 F (38.9 C) or greater.  You develop worsening pain at or near the incision site. MAKE SURE YOU:   Understand these instructions.  Will watch your condition.  Will get help right away if you are not doing well or get worse. Document Released: 03/29/2007 Document Revised: 02/08/2012 Document Reviewed: 05/30/2007 ExitCare Patient Information 2014 ExitCare, [place].   ________________________________________________________________________            WHAT IS A BLOOD TRANSFUSION? Blood Transfusion Information  A transfusion is the replacement of blood or some of its parts. Blood is made up of multiple cells which provide different functions.  Red blood cells carry oxygen and are used for blood loss replacement.  White blood cells fight against infection.  Platelets control bleeding.  Plasma helps clot blood.  Other blood products are available for specialized needs, such as hemophilia or other clotting disorders. BEFORE THE TRANSFUSION  Who gives blood for transfusions?   Healthy volunteers who are fully evaluated to make sure their blood is safe. This is blood bank  blood. Transfusion therapy is the safest it has ever been in the practice of medicine. Before blood is taken from a donor, a complete history is taken to make sure that person has no history of diseases nor engages in risky social behavior (examples are intravenous drug use or sexual activity with multiple partners). The donor's travel history is screened to minimize risk of transmitting infections, such as malaria. The donated blood is tested for signs of infectious diseases, such as HIV and hepatitis. The blood is then tested to be sure it is compatible with you in order to minimize the chance of a transfusion reaction. If you or a relative donates blood, this is often done in anticipation of surgery and is not appropriate for emergency situations. It takes many days to process the donated blood. RISKS AND COMPLICATIONS Although transfusion therapy is very safe and saves many lives, the main dangers of transfusion include:   Getting an infectious disease.  Developing a transfusion reaction. This is an allergic reaction to something in the blood you were given. Every precaution is taken to prevent this. The decision to have a blood transfusion has been considered carefully by your caregiver before blood is given. Blood is not given unless the benefits outweigh the risks. AFTER THE TRANSFUSION  Right after receiving a blood transfusion, you will usually feel much better and more energetic. This is especially true if your red blood cells have gotten low (anemic). The transfusion raises the level of the red blood cells which carry oxygen, and this usually causes an energy increase.  The nurse administering the transfusion will monitor you carefully for complications. HOME CARE INSTRUCTIONS  No special instructions are needed after a transfusion. You may find your energy is better. Speak with your caregiver about any limitations on activity for underlying diseases you may have. SEEK MEDICAL CARE IF:    Your condition is not improving after your transfusion.  You develop redness or irritation at the intravenous (IV) site. SEEK IMMEDIATE MEDICAL CARE IF:  Any of the following symptoms occur over the next 12 hours:  Shaking chills.  You have a temperature by mouth above 102 F (38.9 C), not controlled by medicine.  Chest, back, or muscle pain.  People around you feel you are not acting correctly or are confused.  Shortness of breath or difficulty breathing.  Dizziness and fainting.  You get a rash or develop hives.  You have a decrease in urine output.  Your urine turns a dark color or changes to pink, red, or brown. Any of the following symptoms occur over the next 10 days:  You have a temperature by mouth above 102 F (38.9 C), not controlled by medicine.  Shortness of breath.  Weakness after normal activity.  The white part of the eye turns yellow (jaundice).  You have a decrease in the amount of urine or are urinating less often.  Your urine turns a dark color or changes to pink, red, or brown. Document Released: 11/13/2000 Document Revised: 02/08/2012 Document Reviewed: 07/02/2008 [facility] Patient Information 2014 [place], [place].  _______________________________________________________________________

## 2016-09-25 NOTE — Pre-Procedure Instructions (Signed)
Clearance, Dr. Willey Blade, on chart LOV, Dr. Willey Blade, 08-31-16, on chart CXR 05-08-16 epic EKG 09-04-16 epic Echo 03-12-14 epic

## 2016-10-04 ENCOUNTER — Ambulatory Visit: Payer: Self-pay | Admitting: Orthopedic Surgery

## 2016-10-04 NOTE — H&P (Signed)
[name] DOB: 09/26/1946 Married / Language: English / Race: White Female Date of Admission:  10/05/2016 CC:  Right knee pain History of Present Illness The patient is a 70 year old female who comes in  for a preoperative History and Physical. The patient is scheduled for a right total knee arthroplasty to be performed by Dr. Frank V. Aluisio, MD at [facility] on 10-05-2016. The patient is a 70 year old female who presented for follow up of their knee. The patient is being followed for their right knee pain and osteoarthritis. They are now months out from cortisone injection. Symptoms reported include: pain, aching and instability. The patient feels that they are doing well. Current treatment includes: activity modification. The following medication has been used for pain control: Tylenol. The patient has reported improvement of their symptoms with: Cortisone injections. The patient indicates that they have questions or concerns today regarding pain and their progress at this point. Note for "Follow-up Knee": She is about a year and a half out from the left total knee. She states the left knee is still doing well, but does note some stiffness. She is really pleased with how her left knee has done. She is over a year out on the left knee and has no complaints. Right knee is giving her tremendous amount of pain trouble. She is ready to have that replaced at this time. They have been treated conservatively in the past for the above stated problem and despite conservative measures, they continue to have progressive pain and severe functional limitations and dysfunction. They have failed non-operative management including home exercise, medications, and injections. It is felt that they would benefit from undergoing total joint replacement. Risks and benefits of the procedure have been discussed with the patient and they elect to proceed with surgery. There are no active contraindications to  surgery such as ongoing infection or rapidly progressive neurological disease.  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)  Pulmonary fibrosis (J84.10)  Left knee pain (M25.562)  Primary osteoarthritis of right knee (M17.11)  Status post total left knee replacement (Z96.652)  Autoimmune disorder  child Rheumatoid Arthritis  Osteoarthritis  Depression  Congestive Heart Failure  High blood pressure  Gastroesophageal Reflux Disease  Chronic Obstructive Lung Disease  Menopause  Pulmonary Fibrosis  Scleroderma  Raynaud's Syndrome  Impaired Vision  Allergies Codeine and Related  sensitivity  Family History Osteoarthritis  mother, father, sister and brother Heart disease in female family member before age 65  Rheumatoid Arthritis  father and brother Hypertension  mother Cerebrovascular Accident  mother Depression  mother Diabetes Mellitus  mother Drug / Alcohol Addiction  father Heart Disease  mother and father  Social History Exercise  Exercises weekly; does running / walking Drug/Alcohol Rehab (Previously)  no Living situation  live with spouse Illicit drug use  no Drug/Alcohol Rehab (Currently)  no Alcohol use  current drinker; drinks wine; only occasionally per week Current work status  retired Children  3 Marital status  married Pain Contract  no Number of flights of stairs before winded  1 Tobacco use  former smoker Tobacco / smoke exposure  no Advance Directives  Living Will, Healthcare POA  Medication History PredniSONE (5MG Tablet, Oral) Active. Zolpidem Tartrate (10MG Tablet, Oral) Active. Furosemide (40MG Tablet, Oral two times daily) Active. Aspirin (81MG Tablet Chewable, Oral) Active. Azelastine HCl (0.15% Solution, Nasal) Active. Pantoprazole Sodium (40MG Tablet DR, Oral) Active. Albuterol Sulfate ER (Oral) Specific strength unknown - Active. Plaquenil (200MG Tablet,   Oral) Active. Humira  (10MG/0.2ML Prefill Syr Kit, Subcutaneous) Active. Bactrim (400-80MG Tablet, Oral) Active. (prophylactic while patient is taking Humira) Chlorpheniramine Maleate (4MG Tablet, Oral) Active.    Past Surgical History Tonsillectomy  Hysterectomy  Date: 1987. partial (non-cancerous) Gallbladder Surgery  Date: 2008. laporoscopic Carpal Tunnel Repair  Date: 2004. right     Review of Systems  General Not Present- Chills, Fatigue, Fever, Memory Loss, Night Sweats, Weight Gain and Weight Loss. Skin Not Present- Eczema, Hives, Itching, Lesions and Rash. HEENT Not Present- Dentures, Double Vision, Headache, Hearing Loss, Tinnitus and Visual Loss. Respiratory Present- Cough and Shortness of breath with exertion. Not Present- Allergies, Chronic Cough, Coughing up blood and Shortness of breath at rest. Cardiovascular Not Present- Chest Pain, Difficulty Breathing Lying Down, Murmur, Palpitations, Racing/skipping heartbeats and Swelling. Gastrointestinal Not Present- Abdominal Pain, Bloody Stool, Constipation, Diarrhea, Difficulty Swallowing, Heartburn, Jaundice, Loss of appetitie, Nausea and Vomiting. Female Genitourinary Not Present- Blood in Urine, Discharge, Flank Pain, Incontinence, Painful Urination, Urgency, Urinary frequency, Urinary Retention, Urinating at Night and Weak urinary stream. Musculoskeletal Present- Back Pain, Joint Pain and Morning Stiffness. Not Present- Joint Swelling, Muscle Pain, Muscle Weakness and Spasms. Neurological Not Present- Blackout spells, Difficulty with balance, Dizziness, Paralysis, Tremor and Weakness. Psychiatric Present- Insomnia.  Vitals  Weight: 188 lb Height: 63in Weight was reported by patient. Body Surface Area: 1.88 m Body Mass Index: 33.3 kg/m  Pulse: 80 (Regular)  BP: 124/74 (Sitting, Right Arm, Standard)    Physical Exam  General Mental Status -Alert, cooperative and good historian. General Appearance-pleasant, Not in acute  distress. Orientation-Oriented X3. Build & Nutrition-Well nourished and Well developed.  Head and Neck Head-normocephalic, atraumatic . Neck Global Assessment - supple, no bruit auscultated on the right, no bruit auscultated on the left.  Eye Vision-Wears corrective lenses. Pupil - Bilateral-Regular and Round. Motion - Bilateral-EOMI.  Chest and Lung Exam Auscultation Breath sounds - Rattling - Note: fanit crakle at end inspiratory effort. Adventitious sounds - No Adventitious sounds.  Cardiovascular Auscultation Rhythm - Regular rate and rhythm. Heart Sounds - S1 WNL and S2 WNL. Murmurs & Other Heart Sounds - Auscultation of the heart reveals - No Murmurs.  Abdomen Palpation/Percussion Tenderness - Abdomen is non-tender to palpation. Rigidity (guarding) - Abdomen is soft. Auscultation Auscultation of the abdomen reveals - Bowel sounds normal.  Female Genitourinary Note: Not done, not pertinent to present illness   Musculoskeletal Note: On exam, she is in no distress. Her left knee show no swelling. Range about 0 to 125 with no tenderness or instability. Right knee varus deformity, marked crepitus on range of motion with tenderness medial greater than lateral and no instability noted.     Assessment & Plan  Status post total left knee replacement (Z96.652) Primary osteoarthritis of right knee (M17.11)  Note:Surgical Plans: Right Total Knee Replacement  Disposition: Wants to look into [facility]  PCP: Dr. Roy Fagan - Patient has been seen preoperatively and felt to be stable for surgery.  IV TXA  Anesthesia Issues: None  Signed electronically by Alezandrew L Perkins, III PA-C  

## 2016-10-05 ENCOUNTER — Encounter (HOSPITAL_COMMUNITY): Payer: Self-pay | Admitting: *Deleted

## 2016-10-05 ENCOUNTER — Inpatient Hospital Stay (HOSPITAL_COMMUNITY): Payer: Commercial Managed Care - HMO | Admitting: Anesthesiology

## 2016-10-05 ENCOUNTER — Inpatient Hospital Stay (HOSPITAL_COMMUNITY)
Admission: RE | Admit: 2016-10-05 | Discharge: 2016-10-07 | DRG: 470 | Disposition: A | Discharge status: Skilled Nursing Facility | Payer: Commercial Managed Care - HMO | Source: Ambulatory Visit | Attending: Orthopedic Surgery | Admitting: Orthopedic Surgery

## 2016-10-05 ENCOUNTER — Encounter (HOSPITAL_COMMUNITY)
Admission: RE | Disposition: A | Discharge status: Skilled Nursing Facility | Payer: Self-pay | Source: Ambulatory Visit | Attending: Orthopedic Surgery

## 2016-10-05 DIAGNOSIS — Z79899 Other long term (current) drug therapy: Secondary | ICD-10-CM

## 2016-10-05 DIAGNOSIS — M349 Systemic sclerosis, unspecified: Secondary | ICD-10-CM | POA: Diagnosis present

## 2016-10-05 DIAGNOSIS — J8410 Pulmonary fibrosis, unspecified: Secondary | ICD-10-CM | POA: Diagnosis not present

## 2016-10-05 DIAGNOSIS — K219 Gastro-esophageal reflux disease without esophagitis: Secondary | ICD-10-CM | POA: Diagnosis present

## 2016-10-05 DIAGNOSIS — I5032 Chronic diastolic (congestive) heart failure: Secondary | ICD-10-CM | POA: Diagnosis not present

## 2016-10-05 DIAGNOSIS — Z87891 Personal history of nicotine dependence: Secondary | ICD-10-CM

## 2016-10-05 DIAGNOSIS — Z7982 Long term (current) use of aspirin: Secondary | ICD-10-CM | POA: Diagnosis not present

## 2016-10-05 DIAGNOSIS — R079 Chest pain, unspecified: Secondary | ICD-10-CM | POA: Diagnosis not present

## 2016-10-05 DIAGNOSIS — I7300 Raynaud's syndrome without gangrene: Secondary | ICD-10-CM | POA: Diagnosis present

## 2016-10-05 DIAGNOSIS — J302 Other seasonal allergic rhinitis: Secondary | ICD-10-CM | POA: Diagnosis not present

## 2016-10-05 DIAGNOSIS — Z6833 Body mass index (BMI) 33.0-33.9, adult: Secondary | ICD-10-CM

## 2016-10-05 DIAGNOSIS — M1710 Unilateral primary osteoarthritis, unspecified knee: Secondary | ICD-10-CM

## 2016-10-05 DIAGNOSIS — M069 Rheumatoid arthritis, unspecified: Secondary | ICD-10-CM | POA: Diagnosis not present

## 2016-10-05 DIAGNOSIS — M1711 Unilateral primary osteoarthritis, right knee: Principal | ICD-10-CM | POA: Diagnosis present

## 2016-10-05 DIAGNOSIS — Z90710 Acquired absence of both cervix and uterus: Secondary | ICD-10-CM | POA: Diagnosis not present

## 2016-10-05 DIAGNOSIS — M179 Osteoarthritis of knee, unspecified: Secondary | ICD-10-CM | POA: Diagnosis present

## 2016-10-05 DIAGNOSIS — Z96651 Presence of right artificial knee joint: Secondary | ICD-10-CM | POA: Diagnosis present

## 2016-10-05 DIAGNOSIS — E669 Obesity, unspecified: Secondary | ICD-10-CM | POA: Diagnosis present

## 2016-10-05 DIAGNOSIS — M1712 Unilateral primary osteoarthritis, left knee: Secondary | ICD-10-CM

## 2016-10-05 DIAGNOSIS — I272 Pulmonary hypertension, unspecified: Secondary | ICD-10-CM | POA: Diagnosis not present

## 2016-10-05 HISTORY — PX: TOTAL KNEE ARTHROPLASTY: SHX125

## 2016-10-05 HISTORY — DX: Systemic sclerosis, unspecified: M34.9

## 2016-10-05 LAB — TYPE AND SCREEN
ABO/RH(D): A POS
Antibody Screen: NEGATIVE

## 2016-10-05 SURGERY — ARTHROPLASTY, KNEE, TOTAL
Anesthesia: Spinal | Site: Knee | Laterality: Right

## 2016-10-05 MED ORDER — PHENYLEPHRINE HCL 10 MG/ML IJ SOLN
INTRAMUSCULAR | Status: DC | PRN
  Administered 2016-10-05: 40 ug via INTRAVENOUS
  Administered 2016-10-05: 80 ug via INTRAVENOUS
  Administered 2016-10-05 (×2): 40 ug via INTRAVENOUS

## 2016-10-05 MED ORDER — BUPIVACAINE HCL (PF) 0.25 % IJ SOLN
INTRAMUSCULAR | Status: AC
  Filled 2016-10-05: qty 30

## 2016-10-05 MED ORDER — MIDAZOLAM HCL 5 MG/5ML IJ SOLN
INTRAMUSCULAR | Status: DC | PRN
  Administered 2016-10-05 (×2): 1 mg via INTRAVENOUS

## 2016-10-05 MED ORDER — PREDNISONE 5 MG PO TABS
5.0000 mg | ORAL_TABLET | Freq: Every day | ORAL | Status: DC
  Filled 2016-10-05: qty 1

## 2016-10-05 MED ORDER — FENTANYL CITRATE (PF) 100 MCG/2ML IJ SOLN
INTRAMUSCULAR | Status: AC
  Filled 2016-10-05: qty 2

## 2016-10-05 MED ORDER — ACETAMINOPHEN 325 MG PO TABS: 650.0000 mg | ORAL_TABLET | Freq: Four times a day (QID) | ORAL | Status: DC | PRN

## 2016-10-05 MED ORDER — TRANEXAMIC ACID 1000 MG/10ML IV SOLN
1000.0000 mg | INTRAVENOUS | Status: AC
  Administered 2016-10-05: 1000 mg via INTRAVENOUS
  Filled 2016-10-05: qty 10

## 2016-10-05 MED ORDER — BISACODYL 10 MG RE SUPP: 10.0000 mg | Freq: Every day | RECTAL | Status: DC | PRN

## 2016-10-05 MED ORDER — LACTATED RINGERS IV SOLN
INTRAVENOUS | Status: DC
  Administered 2016-10-05 (×2): via INTRAVENOUS

## 2016-10-05 MED ORDER — ONDANSETRON HCL 4 MG/2ML IJ SOLN
INTRAMUSCULAR | Status: DC | PRN
  Administered 2016-10-05: 4 mg via INTRAVENOUS

## 2016-10-05 MED ORDER — MIDAZOLAM HCL 2 MG/2ML IJ SOLN
INTRAMUSCULAR | Status: AC
Start: 2016-10-05 — End: 2016-10-05
  Filled 2016-10-05: qty 2

## 2016-10-05 MED ORDER — PANTOPRAZOLE SODIUM 40 MG PO TBEC
40.0000 mg | DELAYED_RELEASE_TABLET | Freq: Two times a day (BID) | ORAL | Status: DC
  Administered 2016-10-05 – 2016-10-07 (×4): 40 mg via ORAL
  Filled 2016-10-05 (×4): qty 1

## 2016-10-05 MED ORDER — CEPHALEXIN 500 MG PO CAPS: 500.0000 mg | ORAL_CAPSULE | Freq: Three times a day (TID) | ORAL | Status: DC

## 2016-10-05 MED ORDER — PROPOFOL 10 MG/ML IV BOLUS
INTRAVENOUS | Status: AC
  Filled 2016-10-05: qty 60

## 2016-10-05 MED ORDER — PROMETHAZINE HCL 25 MG/ML IJ SOLN: 6.2500 mg | INTRAMUSCULAR | Status: DC | PRN

## 2016-10-05 MED ORDER — POLYETHYLENE GLYCOL 3350 17 G PO PACK: 17.0000 g | PACK | Freq: Every day | ORAL | Status: DC | PRN

## 2016-10-05 MED ORDER — FENTANYL CITRATE (PF) 100 MCG/2ML IJ SOLN
INTRAMUSCULAR | Status: DC | PRN
  Administered 2016-10-05 (×2): 50 ug via INTRAVENOUS

## 2016-10-05 MED ORDER — FLEET ENEMA 7-19 GM/118ML RE ENEM: 1.0000 | ENEMA | Freq: Once | RECTAL | Status: DC | PRN

## 2016-10-05 MED ORDER — ONDANSETRON HCL 4 MG/2ML IJ SOLN: 4.0000 mg | Freq: Four times a day (QID) | INTRAMUSCULAR | Status: DC | PRN

## 2016-10-05 MED ORDER — ACETAMINOPHEN 650 MG RE SUPP: 650.0000 mg | Freq: Four times a day (QID) | RECTAL | Status: DC | PRN

## 2016-10-05 MED ORDER — ZOLPIDEM TARTRATE 5 MG PO TABS
5.0000 mg | ORAL_TABLET | Freq: Every evening | ORAL | Status: DC | PRN
  Administered 2016-10-06: 5 mg via ORAL
  Filled 2016-10-05: qty 1

## 2016-10-05 MED ORDER — ACETAMINOPHEN 10 MG/ML IV SOLN
1000.0000 mg | Freq: Once | INTRAVENOUS | Status: AC
  Administered 2016-10-05: 1000 mg via INTRAVENOUS

## 2016-10-05 MED ORDER — DOCUSATE SODIUM 100 MG PO CAPS
100.0000 mg | ORAL_CAPSULE | Freq: Two times a day (BID) | ORAL | Status: DC
  Administered 2016-10-05 – 2016-10-07 (×4): 100 mg via ORAL
  Filled 2016-10-05 (×4): qty 1

## 2016-10-05 MED ORDER — OXYCODONE HCL 5 MG PO TABS
5.0000 mg | ORAL_TABLET | ORAL | Status: DC | PRN
  Administered 2016-10-05 – 2016-10-06 (×7): 10 mg via ORAL
  Administered 2016-10-07: 5 mg via ORAL
  Administered 2016-10-07: 10 mg via ORAL
  Administered 2016-10-07: 5 mg via ORAL
  Filled 2016-10-05 (×5): qty 2
  Filled 2016-10-05: qty 1
  Filled 2016-10-05 (×4): qty 2

## 2016-10-05 MED ORDER — SODIUM CHLORIDE 0.9 % IR SOLN
Status: DC | PRN
  Administered 2016-10-05: 1000 mL

## 2016-10-05 MED ORDER — CEFAZOLIN SODIUM-DEXTROSE 2-4 GM/100ML-% IV SOLN
2.0000 g | INTRAVENOUS | Status: AC
  Administered 2016-10-05: 2 g via INTRAVENOUS
  Filled 2016-10-05: qty 100

## 2016-10-05 MED ORDER — BUPIVACAINE HCL 0.25 % IJ SOLN
INTRAMUSCULAR | Status: DC | PRN
  Administered 2016-10-05: 20 mL

## 2016-10-05 MED ORDER — LACTATED RINGERS IV SOLN
INTRAVENOUS | Status: DC
Start: 2016-10-05 — End: 2016-10-05

## 2016-10-05 MED ORDER — BUPIVACAINE IN DEXTROSE 0.75-8.25 % IT SOLN
INTRATHECAL | Status: DC | PRN
  Administered 2016-10-05: 1.6 mL via INTRATHECAL

## 2016-10-05 MED ORDER — ALBUTEROL SULFATE (2.5 MG/3ML) 0.083% IN NEBU: 2.5000 mg | INHALATION_SOLUTION | Freq: Four times a day (QID) | RESPIRATORY_TRACT | Status: DC | PRN

## 2016-10-05 MED ORDER — PHENOL 1.4 % MT LIQD: 1.0000 | OROMUCOSAL | Status: DC | PRN

## 2016-10-05 MED ORDER — DEXAMETHASONE SODIUM PHOSPHATE 10 MG/ML IJ SOLN
10.0000 mg | Freq: Once | INTRAMUSCULAR | Status: AC
  Administered 2016-10-06: 10 mg via INTRAVENOUS
  Filled 2016-10-05: qty 1

## 2016-10-05 MED ORDER — DIPHENHYDRAMINE HCL 12.5 MG/5ML PO ELIX: 12.5000 mg | ORAL_SOLUTION | ORAL | Status: DC | PRN

## 2016-10-05 MED ORDER — ACETAMINOPHEN 10 MG/ML IV SOLN
INTRAVENOUS | Status: AC
  Filled 2016-10-05: qty 100

## 2016-10-05 MED ORDER — MENTHOL 3 MG MT LOZG: 1.0000 | LOZENGE | OROMUCOSAL | Status: DC | PRN

## 2016-10-05 MED ORDER — TRANEXAMIC ACID 1000 MG/10ML IV SOLN
1000.0000 mg | Freq: Once | INTRAVENOUS | Status: AC
  Administered 2016-10-05: 1000 mg via INTRAVENOUS
  Filled 2016-10-05: qty 1100

## 2016-10-05 MED ORDER — DEXAMETHASONE SODIUM PHOSPHATE 10 MG/ML IJ SOLN
INTRAMUSCULAR | Status: AC
Start: 2016-10-05 — End: 2016-10-05
  Filled 2016-10-05: qty 1

## 2016-10-05 MED ORDER — LIDOCAINE 2% (20 MG/ML) 5 ML SYRINGE
INTRAMUSCULAR | Status: AC
  Filled 2016-10-05: qty 5

## 2016-10-05 MED ORDER — CHLORHEXIDINE GLUCONATE 4 % EX LIQD: 60.0000 mL | Freq: Once | CUTANEOUS | Status: DC

## 2016-10-05 MED ORDER — AZELASTINE HCL 0.1 % NA SOLN: 2.0000 | Freq: Two times a day (BID) | NASAL | Status: DC | PRN

## 2016-10-05 MED ORDER — LIDOCAINE 2% (20 MG/ML) 5 ML SYRINGE
INTRAMUSCULAR | Status: DC | PRN
  Administered 2016-10-05: 40 mg via INTRAVENOUS

## 2016-10-05 MED ORDER — PREDNISONE 20 MG PO TABS
20.0000 mg | ORAL_TABLET | Freq: Once | ORAL | Status: AC
  Administered 2016-10-05: 20 mg via ORAL
  Filled 2016-10-05: qty 1

## 2016-10-05 MED ORDER — FENTANYL CITRATE (PF) 100 MCG/2ML IJ SOLN: 25.0000 ug | INTRAMUSCULAR | Status: DC | PRN

## 2016-10-05 MED ORDER — PREDNISONE 5 MG PO TABS
10.0000 mg | ORAL_TABLET | Freq: Two times a day (BID) | ORAL | Status: AC
  Administered 2016-10-06 (×2): 10 mg via ORAL
  Filled 2016-10-05 (×2): qty 2

## 2016-10-05 MED ORDER — ONDANSETRON HCL 4 MG PO TABS: 4.0000 mg | ORAL_TABLET | Freq: Four times a day (QID) | ORAL | Status: DC | PRN

## 2016-10-05 MED ORDER — BUPIVACAINE LIPOSOME 1.3 % IJ SUSP
INTRAMUSCULAR | Status: DC | PRN
  Administered 2016-10-05: 20 mL

## 2016-10-05 MED ORDER — DEXAMETHASONE SODIUM PHOSPHATE 10 MG/ML IJ SOLN
10.0000 mg | Freq: Once | INTRAMUSCULAR | Status: AC
  Administered 2016-10-05: 10 mg via INTRAVENOUS

## 2016-10-05 MED ORDER — METOCLOPRAMIDE HCL 5 MG PO TABS: 5.0000 mg | ORAL_TABLET | Freq: Three times a day (TID) | ORAL | Status: DC | PRN

## 2016-10-05 MED ORDER — TRAMADOL HCL 50 MG PO TABS: 50.0000 mg | ORAL_TABLET | Freq: Four times a day (QID) | ORAL | Status: DC | PRN

## 2016-10-05 MED ORDER — ACETAMINOPHEN 500 MG PO TABS
1000.0000 mg | ORAL_TABLET | Freq: Four times a day (QID) | ORAL | Status: AC
  Administered 2016-10-05 – 2016-10-06 (×4): 1000 mg via ORAL
  Filled 2016-10-05 (×4): qty 2

## 2016-10-05 MED ORDER — METOCLOPRAMIDE HCL 5 MG/ML IJ SOLN: 5.0000 mg | Freq: Three times a day (TID) | INTRAMUSCULAR | Status: DC | PRN

## 2016-10-05 MED ORDER — MORPHINE SULFATE (PF) 2 MG/ML IV SOLN
1.0000 mg | INTRAVENOUS | Status: DC | PRN
  Administered 2016-10-05 – 2016-10-06 (×4): 1 mg via INTRAVENOUS
  Filled 2016-10-05 (×4): qty 1

## 2016-10-05 MED ORDER — ONDANSETRON HCL 4 MG/2ML IJ SOLN
INTRAMUSCULAR | Status: AC
  Filled 2016-10-05: qty 2

## 2016-10-05 MED ORDER — SODIUM CHLORIDE 0.9 % IV SOLN
INTRAVENOUS | Status: DC
  Administered 2016-10-05: 16:00:00 via INTRAVENOUS

## 2016-10-05 MED ORDER — RIVAROXABAN 10 MG PO TABS
10.0000 mg | ORAL_TABLET | Freq: Every day | ORAL | Status: DC
  Administered 2016-10-06 – 2016-10-07 (×2): 10 mg via ORAL
  Filled 2016-10-05 (×2): qty 1

## 2016-10-05 MED ORDER — ALBUTEROL SULFATE HFA 108 (90 BASE) MCG/ACT IN AERS: 2.0000 | INHALATION_SPRAY | Freq: Four times a day (QID) | RESPIRATORY_TRACT | Status: DC | PRN

## 2016-10-05 MED ORDER — PHENYLEPHRINE 40 MCG/ML (10ML) SYRINGE FOR IV PUSH (FOR BLOOD PRESSURE SUPPORT)
PREFILLED_SYRINGE | INTRAVENOUS | Status: AC
  Filled 2016-10-05: qty 10

## 2016-10-05 MED ORDER — METHOCARBAMOL 1000 MG/10ML IJ SOLN
500.0000 mg | Freq: Four times a day (QID) | INTRAVENOUS | Status: DC | PRN
  Administered 2016-10-05: 500 mg via INTRAVENOUS
  Filled 2016-10-05: qty 5
  Filled 2016-10-05: qty 550

## 2016-10-05 MED ORDER — CEFAZOLIN SODIUM-DEXTROSE 2-4 GM/100ML-% IV SOLN
2.0000 g | Freq: Four times a day (QID) | INTRAVENOUS | Status: AC
  Administered 2016-10-05 (×2): 2 g via INTRAVENOUS
  Filled 2016-10-05 (×2): qty 100

## 2016-10-05 MED ORDER — SODIUM CHLORIDE 0.9 % IJ SOLN
INTRAMUSCULAR | Status: DC | PRN
  Administered 2016-10-05: 30 mL

## 2016-10-05 MED ORDER — SODIUM CHLORIDE 0.9 % IJ SOLN
INTRAMUSCULAR | Status: AC
  Filled 2016-10-05: qty 50

## 2016-10-05 MED ORDER — PREDNISONE 5 MG PO TABS
5.0000 mg | ORAL_TABLET | Freq: Two times a day (BID) | ORAL | Status: DC
  Administered 2016-10-07: 5 mg via ORAL

## 2016-10-05 MED ORDER — KETOROLAC TROMETHAMINE 30 MG/ML IJ SOLN: 30.0000 mg | Freq: Once | INTRAMUSCULAR | Status: DC

## 2016-10-05 MED ORDER — FUROSEMIDE 40 MG PO TABS
40.0000 mg | ORAL_TABLET | Freq: Two times a day (BID) | ORAL | Status: DC
  Administered 2016-10-05 – 2016-10-07 (×3): 40 mg via ORAL
  Filled 2016-10-05 (×4): qty 1

## 2016-10-05 MED ORDER — CEFAZOLIN SODIUM-DEXTROSE 2-4 GM/100ML-% IV SOLN
INTRAVENOUS | Status: AC
Start: 2016-10-05 — End: 2016-10-05
  Filled 2016-10-05: qty 100

## 2016-10-05 MED ORDER — METHOCARBAMOL 500 MG PO TABS
500.0000 mg | ORAL_TABLET | Freq: Four times a day (QID) | ORAL | Status: DC | PRN
  Administered 2016-10-05 – 2016-10-07 (×3): 500 mg via ORAL
  Filled 2016-10-05 (×3): qty 1

## 2016-10-05 MED ORDER — MEPERIDINE HCL 50 MG/ML IJ SOLN: 6.2500 mg | INTRAMUSCULAR | Status: DC | PRN

## 2016-10-05 MED ORDER — PROPOFOL 500 MG/50ML IV EMUL
INTRAVENOUS | Status: DC | PRN
  Administered 2016-10-05: 75 ug/kg/min via INTRAVENOUS

## 2016-10-05 MED ORDER — BUPIVACAINE LIPOSOME 1.3 % IJ SUSP
20.0000 mL | Freq: Once | INTRAMUSCULAR | Status: DC
  Filled 2016-10-05: qty 20

## 2016-10-05 SURGICAL SUPPLY — 53 items
BAG DECANTER FOR FLEXI CONT (MISCELLANEOUS) ×2 IMPLANT
BAG ZIPLOCK 12X15 (MISCELLANEOUS) ×2 IMPLANT
BANDAGE ACE 6X5 VEL STRL LF (GAUZE/BANDAGES/DRESSINGS) ×2 IMPLANT
BANDAGE ELASTIC 6 VELCRO ST LF (GAUZE/BANDAGES/DRESSINGS) ×2 IMPLANT
BLADE SAG 18X100X1.27 (BLADE) ×2 IMPLANT
BLADE SAW SGTL 11.0X1.19X90.0M (BLADE) ×2 IMPLANT
BOWL SMART MIX CTS (DISPOSABLE) ×2 IMPLANT
CAPT KNEE TOTAL 3 ATTUNE ×2 IMPLANT
CATH FOLEY 2WAY SLVR  5CC 12FR (CATHETERS) ×1
CATH FOLEY 2WAY SLVR 5CC 12FR (CATHETERS) ×1 IMPLANT
CEMENT HV SMART SET (Cement) ×4 IMPLANT
CLOTH BEACON ORANGE TIMEOUT ST (SAFETY) ×2 IMPLANT
CUFF TOURN SGL QUICK 34 (TOURNIQUET CUFF) ×1
CUFF TRNQT CYL 34X4X40X1 (TOURNIQUET CUFF) ×1 IMPLANT
DECANTER SPIKE VIAL GLASS SM (MISCELLANEOUS) ×2 IMPLANT
DRAPE U-SHAPE 47X51 STRL (DRAPES) ×2 IMPLANT
DRSG ADAPTIC 3X8 NADH LF (GAUZE/BANDAGES/DRESSINGS) ×2 IMPLANT
DRSG PAD ABDOMINAL 8X10 ST (GAUZE/BANDAGES/DRESSINGS) ×2 IMPLANT
DURAPREP 26ML APPLICATOR (WOUND CARE) ×2 IMPLANT
ELECT REM PT RETURN 9FT ADLT (ELECTROSURGICAL) ×2
ELECTRODE REM PT RTRN 9FT ADLT (ELECTROSURGICAL) ×1 IMPLANT
EVACUATOR 1/8 PVC DRAIN (DRAIN) ×2 IMPLANT
GAUZE SPONGE 4X4 12PLY STRL (GAUZE/BANDAGES/DRESSINGS) ×2 IMPLANT
GLOVE BIO SURGEON STRL SZ7.5 (GLOVE) IMPLANT
GLOVE BIO SURGEON STRL SZ8 (GLOVE) ×2 IMPLANT
GLOVE BIOGEL PI IND STRL 6.5 (GLOVE) IMPLANT
GLOVE BIOGEL PI IND STRL 8 (GLOVE) ×1 IMPLANT
GLOVE BIOGEL PI INDICATOR 6.5 (GLOVE)
GLOVE BIOGEL PI INDICATOR 8 (GLOVE) ×1
GLOVE SURG SS PI 6.5 STRL IVOR (GLOVE) IMPLANT
GOWN STRL REUS W/TWL LRG LVL3 (GOWN DISPOSABLE) ×2 IMPLANT
GOWN STRL REUS W/TWL XL LVL3 (GOWN DISPOSABLE) IMPLANT
HANDPIECE INTERPULSE COAX TIP (DISPOSABLE) ×1
IMMOBILIZER KNEE 20 (SOFTGOODS) ×2
IMMOBILIZER KNEE 20 THIGH 36 (SOFTGOODS) ×1 IMPLANT
MANIFOLD NEPTUNE II (INSTRUMENTS) ×2 IMPLANT
NS IRRIG 1000ML POUR BTL (IV SOLUTION) ×2 IMPLANT
PACK TOTAL KNEE CUSTOM (KITS) ×2 IMPLANT
PAD ABD 8X10 STRL (GAUZE/BANDAGES/DRESSINGS) ×2 IMPLANT
PADDING CAST COTTON 6X4 STRL (CAST SUPPLIES) ×2 IMPLANT
POSITIONER SURGICAL ARM (MISCELLANEOUS) ×2 IMPLANT
SET HNDPC FAN SPRY TIP SCT (DISPOSABLE) ×1 IMPLANT
STRIP CLOSURE SKIN 1/2X4 (GAUZE/BANDAGES/DRESSINGS) ×2 IMPLANT
SUT MNCRL AB 4-0 PS2 18 (SUTURE) ×2 IMPLANT
SUT VIC AB 2-0 CT1 27 (SUTURE) ×3
SUT VIC AB 2-0 CT1 TAPERPNT 27 (SUTURE) ×3 IMPLANT
SUT VLOC 180 0 24IN GS25 (SUTURE) ×2 IMPLANT
SYR 50ML LL SCALE MARK (SYRINGE) ×2 IMPLANT
TRAY FOLEY W/METER SILVER 16FR (SET/KITS/TRAYS/PACK) ×2 IMPLANT
WATER STERILE IRR 1000ML POUR (IV SOLUTION) ×4 IMPLANT
WATER STERILE IRR 1500ML POUR (IV SOLUTION) ×2 IMPLANT
WRAP KNEE MAXI GEL POST OP (GAUZE/BANDAGES/DRESSINGS) ×2 IMPLANT
YANKAUER SUCT BULB TIP 10FT TU (MISCELLANEOUS) ×2 IMPLANT

## 2016-10-05 NOTE — Op Note (Signed)
OPERATIVE REPORT-TOTAL KNEE ARTHROPLASTY   Pre-operative diagnosis- Osteoarthritis  Right knee(s)  Post-operative diagnosis- Osteoarthritis Right knee(s)  Procedure-  Right  Total Knee Arthroplasty (Depuy Attune)  Surgeon- [name]. Vickie Dubas, MD  Assistant- Vickie Muslim, PA-C   Anesthesia-  Spinal  EBL-* No blood loss amount entered *   Drains Hemovac  Tourniquet time- 32 minutes @ 893 mm Hg  Complications- None  Condition-PACU - hemodynamically stable.   Brief Clinical Note  [name] is a 70 y.o. year old female with end stage OA of her right knee with progressively worsening pain and dysfunction. She has constant pain, with activity and at rest and significant functional deficits with difficulties even with ADLs. She has had extensive non-op management including analgesics, injections of cortisone and viscosupplements, and home exercise program, but remains in significant pain with significant dysfunction.Radiographs show bone on bone arthritis medial and patellofemoral. She presents now for right Total Knee Arthroplasty.    Procedure in detail---   The patient is brought into the operating room and positioned supine on the operating table. After successful administration of  Spinal,   a tourniquet is placed high on the  Right thigh(s) and the lower extremity is prepped and draped in the usual sterile fashion. Time out is performed by the operating team and then the  Right lower extremity is wrapped in Esmarch, knee flexed and the tourniquet inflated to 300 mmHg.       A midline incision is made with a ten blade through the subcutaneous tissue to the level of the extensor mechanism. A fresh blade is used to make a medial parapatellar arthrotomy. Soft tissue over the proximal medial tibia is subperiosteally elevated to the joint line with a knife and into the semimembranosus bursa with a Cobb elevator. Soft tissue over the proximal lateral tibia is elevated with attention  being paid to avoiding the patellar tendon on the tibial tubercle. The patella is everted, knee flexed 90 degrees and the ACL and PCL are removed. Findings are bone on bone medial and patellofemoral with large global osteophytes.        The drill is used to create a starting hole in the distal femur and the canal is thoroughly irrigated with sterile saline to remove the fatty contents. The 5 degree Right  valgus alignment guide is placed into the femoral canal and the distal femoral cutting block is pinned to remove 9 mm off the distal femur. Resection is made with an oscillating saw.      The tibia is subluxed forward and the menisci are removed. The extramedullary alignment guide is placed referencing proximally at the medial aspect of the tibial tubercle and distally along the second metatarsal axis and tibial crest. The block is pinned to remove 40m off the more deficient medial  side. Resection is made with an oscillating saw. Size 5is the most appropriate size for the tibia and the proximal tibia is prepared with the modular drill and keel punch for that size.      The femoral sizing guide is placed and size 5 is most appropriate. Rotation is marked off the epicondylar axis and confirmed by creating a rectangular flexion gap at 90 degrees. The size 5 cutting block is pinned in this rotation and the anterior, posterior and chamfer cuts are made with the oscillating saw. The intercondylar block is then placed and that cut is made.      Trial size 5 tibial component, trial size 5 posterior stabilized femur  and a 6  mm posterior stabilized rotating platform insert trial is placed. Full extension is achieved with excellent varus/valgus and anterior/posterior balance throughout full range of motion. The patella is everted and thickness measured to be 22  mm. Free hand resection is taken to 12 mm, a 35 template is placed, lug holes are drilled, trial patella is placed, and it tracks normally. Osteophytes are  removed off the posterior femur with the trial in place. All trials are removed and the cut bone surfaces prepared with pulsatile lavage. Cement is mixed and once ready for implantation, the size 5 tibial implant, size  5 posterior stabilized femoral component, and the size 35 patella are cemented in place and the patella is held with the clamp. The trial insert is placed and the knee held in full extension. The Exparel (20 ml mixed with 30 ml saline) and .25% Bupivicaine, are injected into the extensor mechanism, posterior capsule, medial and lateral gutters and subcutaneous tissues.  All extruded cement is removed and once the cement is hard the permanent 6 mm posterior stabilized rotating platform insert is placed into the tibial tray.      The wound is copiously irrigated with saline solution and the extensor mechanism closed over a hemovac drain with #1 V-loc suture. The tourniquet is released for a total tourniquet time of 32  minutes. Flexion against gravity is 140 degrees and the patella tracks normally. Subcutaneous tissue is closed with 2.0 vicryl and subcuticular with running 4.0 Monocryl. The incision is cleaned and dried and steri-strips and a bulky sterile dressing are applied. The limb is placed into a knee immobilizer and the patient is awakened and transported to recovery in stable condition.      Please note that a surgical assistant was a medical necessity for this procedure in order to perform it in a safe and expeditious manner. Surgical assistant was necessary to retract the ligaments and vital neurovascular structures to prevent injury to them and also necessary for proper positioning of the limb to allow for anatomic placement of the prosthesis.   [name] Vickie Ostrum, MD    10/05/2016, 11:09 AM

## 2016-10-05 NOTE — Interval H&P Note (Signed)
History and Physical Interval Note:  10/05/2016 9:13 AM  [name]  has presented today for surgery, with the diagnosis of OA right knee  The various methods of treatment have been discussed with the patient and family. After consideration of risks, benefits and other options for treatment, the patient has consented to  Procedure(s): RIGHT TOTAL KNEE ARTHROPLASTY (Right) as a surgical intervention .  The patient's history has been reviewed, patient examined, no change in status, stable for surgery.  I have reviewed the patient's chart and labs.  Questions were answered to the patient's satisfaction.     Gearlean Alf

## 2016-10-05 NOTE — Anesthesia Preprocedure Evaluation (Signed)
Anesthesia Evaluation  Patient identified by MRN, date of birth, ID band Patient awake    Reviewed: Allergy & Precautions, NPO status , Patient's Chart, lab work & pertinent test results  Airway Mallampati: II  TM Distance: >3 FB Neck ROM: Full    Dental no notable dental hx.    Pulmonary shortness of breath, former smoker,  Pulmonary fibrosis   Pulmonary exam normal breath sounds clear to auscultation       Cardiovascular hypertension, +CHF  Normal cardiovascular exam Rhythm:Regular Rate:Normal     Neuro/Psych negative neurological ROS  negative psych ROS   GI/Hepatic negative GI ROS, Neg liver ROS,   Endo/Other  negative endocrine ROS  Renal/GU negative Renal ROS  negative genitourinary   Musculoskeletal  (+) Arthritis , Rheumatoid disorders,  scleroderma   Abdominal (+) + obese,   Peds negative pediatric ROS (+)  Hematology negative hematology ROS (+)   Anesthesia Other Findings   Reproductive/Obstetrics negative OB ROS                             Anesthesia Physical  Anesthesia Plan  ASA: III  Anesthesia Plan: Spinal   Post-op Pain Management:    Induction:   Airway Management Planned:   Additional Equipment:   Intra-op Plan:   Post-operative Plan:   Informed Consent: I have reviewed the patients History and Physical, chart, labs and discussed the procedure including the risks, benefits and alternatives for the proposed anesthesia with the patient or authorized representative who has indicated his/her understanding and acceptance.     Plan Discussed with: CRNA and Surgeon  Anesthesia Plan Comments:         Anesthesia Quick Evaluation

## 2016-10-05 NOTE — Transfer of Care (Signed)
Immediate Anesthesia Transfer of Care Note  Patient: [name]  Procedure(s) Performed: Procedure(s): RIGHT TOTAL KNEE ARTHROPLASTY (Right)  Patient Location: PACU  Anesthesia Type:MAC and Spinal  Level of Consciousness: awake and patient cooperative  Airway & Oxygen Therapy: Patient Spontanous Breathing and Patient connected to face mask oxygen  Post-op Assessment: Report given to RN and Post -op Vital signs reviewed and stable  Post vital signs: Reviewed and stable  Last Vitals:  Vitals:   10/05/16 0749  BP: (!) 158/79  Pulse: 91  Resp: 16  Temp: 36.5 C    Last Pain:  Vitals:   10/05/16 0749  TempSrc: Oral      Patients Stated Pain Goal: 4 (35/70/17 7939)  Complications: No apparent anesthesia complications

## 2016-10-05 NOTE — Anesthesia Procedure Notes (Signed)
Procedure Name: MAC Date/Time: 10/05/2016 10:17 AM Performed by: Dione Booze Pre-anesthesia Checklist: Patient identified, Emergency Drugs available, Suction available and Patient being monitored Patient Re-evaluated:Patient Re-evaluated prior to inductionOxygen Delivery Method: Simple face mask Placement Confirmation: positive ETCO2

## 2016-10-05 NOTE — Anesthesia Procedure Notes (Signed)
Spinal  Patient location during procedure: OR Start time: 10/05/2016 10:15 AM End time: 10/05/2016 10:17 AM Staffing Anesthesiologist: Lyn Hollingshead Performed: anesthesiologist  Preanesthetic Checklist Completed: patient identified, surgical consent, pre-op evaluation, timeout performed, IV checked, risks and benefits discussed and monitors and equipment checked Spinal Block Patient position: sitting Prep: site prepped and draped and DuraPrep Patient monitoring: heart rate, cardiac monitor, continuous pulse ox and blood pressure Approach: midline Location: L3-4 Injection technique: single-shot Needle Needle type: Pencan  Needle gauge: 24 G Needle length: 9 cm Needle insertion depth: 6 cm Assessment Sensory level: T10

## 2016-10-05 NOTE — Anesthesia Postprocedure Evaluation (Signed)
Anesthesia Post Note  Patient: KAMBRE MESSNER  Procedure(s) Performed: Procedure(s) (LRB): RIGHT TOTAL KNEE ARTHROPLASTY (Right)  Patient location during evaluation: PACU Anesthesia Type: Spinal Level of consciousness: awake Pain management: pain level controlled Vital Signs Assessment: post-procedure vital signs reviewed and stable Respiratory status: spontaneous breathing Cardiovascular status: stable Postop Assessment: no headache, no backache, spinal receding, patient able to bend at knees and no signs of nausea or vomiting Anesthetic complications: no     Last Vitals:  Vitals:   10/05/16 1145 10/05/16 1200  BP: (!) 118/57 (!) 128/58  Pulse: 79 72  Resp: 16 16  Temp: 36.6 C     Last Pain:  Vitals:   10/05/16 0749  TempSrc: Oral   Pain Goal: Patients Stated Pain Goal: 4 (10/05/16 0736)               Norfleet Capers JR,JOHN Mateo Flow

## 2016-10-05 NOTE — H&P (View-Only) (Signed)
[name] DOB: 04-28-1946 Married / Language: English / Race: White Female Date of Admission:  10/05/2016 CC:  Right knee pain History of Present Illness The patient is a 70 year old female who comes in  for a preoperative History and Physical. The patient is scheduled for a right total knee arthroplasty to be performed by Dr. Dione Plover. Aluisio, MD at [facility] on 10-05-2016. The patient is a 70 year old female who presented for follow up of their knee. The patient is being followed for their right knee pain and osteoarthritis. They are now months out from cortisone injection. Symptoms reported include: pain, aching and instability. The patient feels that they are doing well. Current treatment includes: activity modification. The following medication has been used for pain control: Tylenol. The patient has reported improvement of their symptoms with: Cortisone injections. The patient indicates that they have questions or concerns today regarding pain and their progress at this point. Note for "Follow-up Knee": She is about a year and a half out from the left total knee. She states the left knee is still doing well, but does note some stiffness. She is really pleased with how her left knee has done. She is over a year out on the left knee and has no complaints. Right knee is giving her tremendous amount of pain trouble. She is ready to have that replaced at this time. They have been treated conservatively in the past for the above stated problem and despite conservative measures, they continue to have progressive pain and severe functional limitations and dysfunction. They have failed non-operative management including home exercise, medications, and injections. It is felt that they would benefit from undergoing total joint replacement. Risks and benefits of the procedure have been discussed with the patient and they elect to proceed with surgery. There are no active contraindications to  surgery such as ongoing infection or rapidly progressive neurological disease.  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)  Pulmonary fibrosis (J84.10)  Left knee pain (M25.562)  Primary osteoarthritis of right knee (M17.11)  Status post total left knee replacement (E72.094)  Autoimmune disorder  child Rheumatoid Arthritis  Osteoarthritis  Depression  Congestive Heart Failure  High blood pressure  Gastroesophageal Reflux Disease  Chronic Obstructive Lung Disease  Menopause  Pulmonary Fibrosis  Scleroderma  Raynaud's Syndrome  Impaired Vision  Allergies Codeine and Related  sensitivity  Family History Osteoarthritis  mother, father, sister and brother Heart disease in female family member before age 80  Rheumatoid Arthritis  father and brother Hypertension  mother Cerebrovascular Accident  mother Depression  mother Diabetes Mellitus  mother Drug / Alcohol Addiction  father Heart Disease  mother and father  Social History Exercise  Exercises weekly; does running / walking Drug/Alcohol Rehab (Previously)  no Living situation  live with spouse Illicit drug use  no Drug/Alcohol Rehab (Currently)  no Alcohol use  current drinker; drinks wine; only occasionally per week Current work status  retired Children  3 Marital status  married Pain Contract  no Number of flights of stairs before winded  1 Tobacco use  former smoker Tobacco / smoke exposure  no Advance Directives  Living Will, Healthcare POA  Medication History PredniSONE (5MG Tablet, Oral) Active. Zolpidem Tartrate (10MG Tablet, Oral) Active. Furosemide (40MG Tablet, Oral two times daily) Active. Aspirin (81MG Tablet Chewable, Oral) Active. Azelastine HCl (0.15% Solution, Nasal) Active. Pantoprazole Sodium (40MG Tablet DR, Oral) Active. Albuterol Sulfate ER (Oral) Specific strength unknown - Active. Plaquenil (200MG Tablet,  Oral) Active. Humira  (10MG/0.2ML Prefill Syr Kit, Subcutaneous) Active. Bactrim (400-80MG Tablet, Oral) Active. (prophylactic while patient is taking Humira) Chlorpheniramine Maleate (4MG Tablet, Oral) Active.    Past Surgical History Tonsillectomy  Hysterectomy  Date: 7. partial (non-cancerous) Gallbladder Surgery  Date: 2008. laporoscopic Carpal Tunnel Repair  Date: 2004. right     Review of Systems  General Not Present- Chills, Fatigue, Fever, Memory Loss, Night Sweats, Weight Gain and Weight Loss. Skin Not Present- Eczema, Hives, Itching, Lesions and Rash. HEENT Not Present- Dentures, Double Vision, Headache, Hearing Loss, Tinnitus and Visual Loss. Respiratory Present- Cough and Shortness of breath with exertion. Not Present- Allergies, Chronic Cough, Coughing up blood and Shortness of breath at rest. Cardiovascular Not Present- Chest Pain, Difficulty Breathing Lying Down, Murmur, Palpitations, Racing/skipping heartbeats and Swelling. Gastrointestinal Not Present- Abdominal Pain, Bloody Stool, Constipation, Diarrhea, Difficulty Swallowing, Heartburn, Jaundice, Loss of appetitie, Nausea and Vomiting. Female Genitourinary Not Present- Blood in Urine, Discharge, Flank Pain, Incontinence, Painful Urination, Urgency, Urinary frequency, Urinary Retention, Urinating at Night and Weak urinary stream. Musculoskeletal Present- Back Pain, Joint Pain and Morning Stiffness. Not Present- Joint Swelling, Muscle Pain, Muscle Weakness and Spasms. Neurological Not Present- Blackout spells, Difficulty with balance, Dizziness, Paralysis, Tremor and Weakness. Psychiatric Present- Insomnia.  Vitals  Weight: 188 lb Height: 63in Weight was reported by patient. Body Surface Area: 1.88 m Body Mass Index: 33.3 kg/m  Pulse: 80 (Regular)  BP: 124/74 (Sitting, Right Arm, Standard)    Physical Exam  General Mental Status -Alert, cooperative and good historian. General Appearance-pleasant, Not in acute  distress. Orientation-Oriented X3. Build & Nutrition-Well nourished and Well developed.  Head and Neck Head-normocephalic, atraumatic . Neck Global Assessment - supple, no bruit auscultated on the right, no bruit auscultated on the left.  Eye Vision-Wears corrective lenses. Pupil - Bilateral-Regular and Round. Motion - Bilateral-EOMI.  Chest and Lung Exam Auscultation Breath sounds - Rattling - Note: fanit crakle at end inspiratory effort. Adventitious sounds - No Adventitious sounds.  Cardiovascular Auscultation Rhythm - Regular rate and rhythm. Heart Sounds - S1 WNL and S2 WNL. Murmurs & Other Heart Sounds - Auscultation of the heart reveals - No Murmurs.  Abdomen Palpation/Percussion Tenderness - Abdomen is non-tender to palpation. Rigidity (guarding) - Abdomen is soft. Auscultation Auscultation of the abdomen reveals - Bowel sounds normal.  Female Genitourinary Note: Not done, not pertinent to present illness   Musculoskeletal Note: On exam, she is in no distress. Her left knee show no swelling. Range about 0 to 125 with no tenderness or instability. Right knee varus deformity, marked crepitus on range of motion with tenderness medial greater than lateral and no instability noted.     Assessment & Plan  Status post total left knee replacement (G40.102) Primary osteoarthritis of right knee (M17.11)  Note:Surgical Plans: Right Total Knee Replacement  Disposition: Wants to look into [facility]  PCP: Dr. Asencion Noble - Patient has been seen preoperatively and felt to be stable for surgery.  IV TXA  Anesthesia Issues: None  Signed electronically by Ok Edwards, III PA-C

## 2016-10-06 LAB — BASIC METABOLIC PANEL
ANION GAP: 7 (ref 5–15)
BUN: 12 mg/dL (ref 6–20)
CO2: 27 mmol/L (ref 22–32)
Calcium: 8.7 mg/dL — ABNORMAL LOW (ref 8.9–10.3)
Chloride: 106 mmol/L (ref 101–111)
Creatinine, Ser: 0.75 mg/dL (ref 0.44–1.00)
GFR calc Af Amer: >60 mL/min (ref >60)
Glucose, Bld: 167 mg/dL — ABNORMAL HIGH (ref 65–99)
POTASSIUM: 4.1 mmol/L (ref 3.5–5.1)
SODIUM: 140 mmol/L (ref 135–145)

## 2016-10-06 LAB — CBC
HCT: 38.6 % (ref 36.0–46.0)
Hemoglobin: 13 g/dL (ref 12.0–15.0)
MCH: 32.2 pg (ref 26.0–34.0)
MCHC: 33.7 g/dL (ref 30.0–36.0)
MCV: 95.5 fL (ref 78.0–100.0)
PLATELETS: 241 10*3/uL (ref 150–400)
RBC: 4.04 MIL/uL (ref 3.87–5.11)
RDW: 12.4 % (ref 11.5–15.5)
WBC: 14.3 10*3/uL — AB (ref 4.0–10.5)

## 2016-10-06 NOTE — Clinical Social Work Placement (Signed)
   CLINICAL SOCIAL WORK PLACEMENT  NOTE  Date:  10/06/2016  Patient Details  Name: [name] MRN: [id number] Date of Birth: [birth year]  Clinical Social Work is seeking post-discharge placement for this patient at the Superior level of care (*CSW will initial, date and re-position this form in  chart as items are completed):  No   Patient/family provided with [facility] Work Department's list of facilities offering this level of care within the geographic area requested by the patient (or if unable, by the patient's family).  Yes   Patient/family informed of their freedom to choose among providers that offer the needed level of care, that participate in Medicare, Medicaid or managed care program needed by the patient, have an available bed and are willing to accept the patient.  Yes   Patient/family informed of [facility]'s ownership interest in [facility] and [facility], as well as of the fact that they are under no obligation to receive care at these facilities.  PASRR submitted to EDS on       PASRR number received on       Existing PASRR number confirmed on 10/06/16     FL2 transmitted to all facilities in geographic area requested by pt/family on 10/06/16     FL2 transmitted to all facilities within larger geographic area on       Patient informed that his/her managed care company has contracts with or will negotiate with certain facilities, including the following:        Yes   Patient/family informed of bed offers received.  Patient chooses bed at [facility]     Physician recommends and patient chooses bed at      Patient to be transferred to [facility] on  .  Patient to be transferred to facility by       Patient family notified on   of transfer.  Name of family member notified:        PHYSICIAN       Additional Comment:    _______________________________________________ [name], [place] 10/06/2016, 2:11 PM

## 2016-10-06 NOTE — Discharge Instructions (Addendum)
Dr. Gaynelle Arabian Total Joint Specialist [facility] [address] [place], [place] [address] [phone]  TOTAL KNEE REPLACEMENT POSTOPERATIVE DIRECTIONS  Knee Rehabilitation, Guidelines Following Surgery  Results after knee surgery are often greatly improved when you follow the exercise, range of motion and muscle strengthening exercises prescribed by your doctor. Safety measures are also important to protect the knee from further injury. Any time any of these exercises cause you to have increased pain or swelling in your knee joint, decrease the amount until you are comfortable again and slowly increase them. If you have problems or questions, call your caregiver or physical therapist for advice.   HOME CARE INSTRUCTIONS  Remove items at home which could result in a fall. This includes throw rugs or furniture in walking pathways.   ICE to the affected knee every three hours for 30 minutes at a time and then as needed for pain and swelling.  Continue to use ice on the knee for pain and swelling from surgery. You may notice swelling that will progress down to the foot and ankle.  This is normal after surgery.  Elevate the leg when you are not up walking on it.    Continue to use the breathing machine which will help keep your temperature down.  It is common for your temperature to cycle up and down following surgery, especially at night when you are not up moving around and exerting yourself.  The breathing machine keeps your lungs expanded and your temperature down.  Do not place pillow under knee, focus on keeping the knee straight while resting  DIET You may resume your previous home diet once your are discharged from the hospital.  DRESSING / WOUND CARE / SHOWERING You may shower 3 days after surgery, but keep the wounds dry during showering.  You may use an occlusive plastic wrap (Press'n Seal for example), NO SOAKING/SUBMERGING IN THE BATHTUB.  If the  bandage gets wet, change with a clean dry gauze.  If the incision gets wet, pat the wound dry with a clean towel. You may start showering once you are discharged home but do not submerge the incision under water. Just pat the incision dry and apply a dry gauze dressing on daily. Change the surgical dressing daily and reapply a dry dressing each time.  ACTIVITY Walk with your walker as instructed. Use walker as long as suggested by your caregivers. Avoid periods of inactivity such as sitting longer than an hour when not asleep. This helps prevent blood clots.  You may resume a sexual relationship in one month or when given the OK by your doctor.  You may return to work once you are cleared by your doctor.  Do not drive a car for 6 weeks or until released by you surgeon.  Do not drive while taking narcotics.  WEIGHT BEARING Weight bearing as tolerated with assist device (walker, cane, etc) as directed, use it as long as suggested by your surgeon or therapist, typically at least 4-6 weeks.  POSTOPERATIVE CONSTIPATION PROTOCOL Constipation - defined medically as fewer than three stools per week and severe constipation as less than one stool per week.  One of the most common issues patients have following surgery is constipation.  Even if you have a regular bowel pattern at home, your normal regimen is likely to be disrupted due to multiple reasons following surgery.  Combination of anesthesia, postoperative narcotics, change in appetite and fluid intake all can affect your bowels.  In order to avoid complications following surgery, here are some recommendations in order to help you during your recovery period. ° °Colace (docusate) - Pick up an over-the-counter form of Colace or another stool softener and take twice a day as long as you are requiring postoperative pain medications.  Take with a full glass of water daily.  If you experience loose stools or diarrhea, hold the colace until you stool forms  back up.  If your symptoms do not get better within 1 week or if they get worse, check with your doctor. ° °Dulcolax (bisacodyl) - Pick up over-the-counter and take as directed by the product packaging as needed to assist with the movement of your bowels.  Take with a full glass of water.  Use this product as needed if not relieved by Colace only.  ° °MiraLax (polyethylene glycol) - Pick up over-the-counter to have on hand.  MiraLax is a solution that will increase the amount of water in your bowels to assist with bowel movements.  Take as directed and can mix with a glass of water, juice, soda, coffee, or tea.  Take if you go more than two days without a movement. °Do not use MiraLax more than once per day. Call your doctor if you are still constipated or irregular after using this medication for 7 days in a row. ° °If you continue to have problems with postoperative constipation, please contact the office for further assistance and recommendations.  If you experience "the worst abdominal pain ever" or develop nausea or vomiting, please contact the office immediatly for further recommendations for treatment. ° °ITCHING ° If you experience itching with your medications, try taking only a single pain pill, or even half a pain pill at a time.  You can also use Benadryl over the counter for itching or also to help with sleep.  ° °TED HOSE STOCKINGS °Wear the elastic stockings on both legs for three weeks following surgery during the day but you may remove then at night for sleeping. ° °MEDICATIONS °See your medication summary on the “After Visit Summary” that the nursing staff will review with you prior to discharge.  You may have some home medications which will be placed on hold until you complete the course of blood thinner medication.  It is important for you to complete the blood thinner medication as prescribed by your surgeon.  Continue your approved medications as instructed at time of  discharge. ° °PRECAUTIONS °If you experience chest pain or shortness of breath - call 911 immediately for transfer to the hospital emergency department.  °If you develop a fever greater that 101 F, purulent drainage from wound, increased redness or drainage from wound, foul odor from the wound/dressing, or calf pain - CONTACT YOUR SURGEON.   °                                                °FOLLOW-UP APPOINTMENTS °Make sure you keep all of your appointments after your operation with your surgeon and caregivers. You should call the office at the above phone number and make an appointment for approximately two weeks after the date of your surgery or on the date instructed by your surgeon outlined in the "After Visit Summary". ° ° °RANGE OF MOTION AND STRENGTHENING EXERCISES  °Rehabilitation of the knee is important following a knee injury or   an operation. After just a few days of immobilization, the muscles of the thigh which control the knee become weakened and shrink (atrophy). Knee exercises are designed to build up the tone and strength of the thigh muscles and to improve knee motion. Often times heat used for twenty to thirty minutes before working out will loosen up your tissues and help with improving the range of motion but do not use heat for the first two weeks following surgery. These exercises can be done on a training (exercise) mat, on the floor, on a table or on a bed. Use what ever works the best and is most comfortable for you Knee exercises include:  Leg Lifts - While your knee is still immobilized in a splint or cast, you can do straight leg raises. Lift the leg to 60 degrees, hold for 3 sec, and slowly lower the leg. Repeat 10-20 times 2-3 times daily. Perform this exercise against resistance later as your knee gets better.  Quad and Hamstring Sets - Tighten up the muscle on the front of the thigh (Quad) and hold for 5-10 sec. Repeat this 10-20 times hourly. Hamstring sets are done by pushing the  foot backward against an object and holding for 5-10 sec. Repeat as with quad sets.   Leg Slides: Lying on your back, slowly slide your foot toward your buttocks, bending your knee up off the floor (only go as far as is comfortable). Then slowly slide your foot back down until your leg is flat on the floor again.  Angel Wings: Lying on your back spread your legs to the side as far apart as you can without causing discomfort.  A rehabilitation program following serious knee injuries can speed recovery and prevent re-injury in the future due to weakened muscles. Contact your doctor or a physical therapist for more information on knee rehabilitation.   IF YOU ARE TRANSFERRED TO A SKILLED REHAB FACILITY If the patient is transferred to a skilled rehab facility following release from the hospital, a list of the current medications will be sent to the facility for the patient to continue.  When discharged from the skilled rehab facility, please have the facility set up the patient's [place] prior to being released. Also, the skilled facility will be responsible for providing the patient with their medications at time of release from the facility to include their pain medication, the muscle relaxants, and their blood thinner medication. If the patient is still at the rehab facility at time of the two week follow up appointment, the skilled rehab facility will also need to assist the patient in arranging follow up appointment in our office and any transportation needs.  MAKE SURE YOU:  Understand these instructions.  Get help right away if you are not doing well or get worse.    Pick up stool softner and laxative for home use following surgery while on pain medications. Do not submerge incision under water. Please use good hand washing techniques while changing dressing each day. May shower starting three days after surgery. Please use a clean towel to pat the incision dry following  showers. Continue to use ice for pain and swelling after surgery. Do not use any lotions or creams on the incision until instructed by your surgeon.  Take Xarelto for two and a half more weeks, then discontinue Xarelto. Once the patient has completed the Xarelto, they may resume the 81 mg Aspirin.    Information on my medicine - XARELTO (Rivaroxaban)  This medication education was reviewed with me or my healthcare representative as part of my discharge preparation.  The pharmacist that spoke with me during my hospital stay was:  Glogovac,nikola, [facility]  Why was Xarelto prescribed for you? Xarelto was prescribed for you to reduce the risk of blood clots forming after orthopedic surgery. The medical term for these abnormal blood clots is venous thromboembolism (VTE).  What do you need to know about xarelto ? Take your Xarelto ONCE DAILY at the same time every day. You may take it either with or without food.  If you have difficulty swallowing the tablet whole, you may crush it and mix in applesauce just prior to taking your dose.  Take Xarelto exactly as prescribed by your doctor and DO NOT stop taking Xarelto without talking to the doctor who prescribed the medication.  Stopping without other VTE prevention medication to take the place of Xarelto may increase your risk of developing a clot.  After discharge, you should have regular check-up appointments with your healthcare provider that is prescribing your Xarelto.    What do you do if you miss a dose? If you miss a dose, take it as soon as you remember on the same day then continue your regularly scheduled once daily regimen the next day. Do not take two doses of Xarelto on the same day.   Important Safety Information A possible side effect of Xarelto is bleeding. You should call your healthcare provider right away if you experience any of the following: ? Bleeding from an injury or your nose that does not stop. ? Unusual  colored urine (red or dark brown) or unusual colored stools (red or black). ? Unusual bruising for unknown reasons. ? A serious fall or if you hit your head (even if there is no bleeding).  Some medicines may interact with Xarelto and might increase your risk of bleeding while on Xarelto. To help avoid this, consult your healthcare provider or pharmacist prior to using any new prescription or non-prescription medications, including herbals, vitamins, non-steroidal anti-inflammatory drugs (NSAIDs) and supplements.  This website has more information on Xarelto: [email or web address]

## 2016-10-06 NOTE — Evaluation (Signed)
Occupational Therapy Evaluation Patient Details Name: [name] MRN: [id number] DOB: 03-10-1946 Today's Date: 10/06/2016    History of Present Illness s/p R TKA, h/o L '16   Clinical Impression   This 70 year old female was admitted for the above sx. She will benefit from continued OT.  Pt needs min to mod A for transfers and ADLs. Goals in acute are for supervision to min A.    Follow Up Recommendations  SNF    Equipment Recommendations   (likely none)    Recommendations for Other Services       Precautions / Restrictions Precautions Precautions: Knee;Fall Restrictions Weight Bearing Restrictions: No      Mobility Bed Mobility Overal bed mobility: Needs Assistance Bed Mobility: Supine to Sit     Supine to sit: Mod assist;+2 for physical assistance     General bed mobility comments: needed +2 due to bed positioning difficulties (locked out at awkward angle)  Transfers Overall transfer level: Needs assistance Equipment used: Rolling walker (2 wheeled) Transfers: Sit to/from [facility] Sit to Stand: Min assist Stand pivot transfers: Min assist       General transfer comment: assist to rise and steady. Cues for UE/LE placement and sequence for SPT    Balance                                            ADL Overall ADL's : Needs assistance/impaired     Grooming: Set up;Sitting   Upper Body Bathing: Set up;Sitting   Lower Body Bathing: Minimal assistance;Sit to/from stand   Upper Body Dressing : Set up;Sitting   Lower Body Dressing: Moderate assistance;Sit to/from stand   Toilet Transfer: Minimal assistance;Stand-pivot;RW (to chair)   Toileting- Clothing Manipulation and Hygiene: Min guard;Sit to/from stand         General ADL Comments: Pt felt lightheaded after transfer to chair.  BP 157/56.       Vision     Perception     Praxis      Pertinent Vitals/Pain Pain Assessment: Faces Faces Pain  Scale: Hurts little more Pain Location: R knee Pain Intervention(s): Limited activity within patient's tolerance;Monitored during session;Premedicated before session;Repositioned (declined further ice)     Hand Dominance     Extremity/Trunk Assessment Upper Extremity Assessment Upper Extremity Assessment: Overall WFL for tasks assessed           Communication Communication Communication: No difficulties   Cognition Arousal/Alertness: Awake/alert Behavior During Therapy: WFL for tasks assessed/performed Overall Cognitive Status: Within Functional Limits for tasks assessed                     General Comments       Exercises       Shoulder Instructions      Home Living Family/patient expects to be discharged to:: Unsure                                 Additional Comments: Pt wants to go to SNF; husband has cardiac issues and is limited with helping her. She has high commodes and walk in shower      Prior Functioning/Environment Level of Independence: Independent                 OT Problem List: Decreased strength;Decreased  activity tolerance;Decreased knowledge of use of DME or AE;Pain   OT Treatment/Interventions: Self-care/ADL training;DME and/or AE instruction;Patient/family education    OT Goals(Current goals can be found in the care plan section) Acute Rehab OT Goals Patient Stated Goal: rehab to return to independence, then home OT Goal Formulation: With patient Time For Goal Achievement: 10/13/16 Potential to Achieve Goals: Good ADL Goals Pt Will Perform Grooming: with supervision;standing Pt Will Perform Lower Body Bathing: with supervision;with adaptive equipment;sit to/from stand Pt Will Transfer to Toilet: with min guard assist;ambulating;bedside commode Pt Will Perform Toileting - Clothing Manipulation and hygiene: with supervision;sit to/from stand Pt Will Perform Tub/Shower Transfer: Shower transfer;with min guard  assist;ambulating Additional ADL Goal #1: pt wil perform bed mobility with min A from flat bed  OT Frequency: Min 2X/week   Barriers to D/C:            Co-evaluation              End of Session CPM Right Knee CPM Right Knee: Off  Activity Tolerance:  (limited by lightheadedness) Patient left: in chair;with call bell/phone within reach   Time: 0925-0945 OT Time Calculation (min): 20 min Charges:  OT General Charges $OT Visit: 1 Procedure OT Evaluation $OT Eval Low Complexity: 1 Procedure G-Codes:    Goldy Calandra October 19, 2016, 10:07 AM Lesle Chris, OTR/L [phone] 10/19/16

## 2016-10-06 NOTE — Progress Notes (Signed)
Physical Therapy Treatment Patient Details Name: [name] MRN: [id number] DOB: 1946/07/26 Today's Date: 10/06/2016    History of Present Illness 70 yo female s/p R TKA 10/05/16. Hx of R TKA 2016    PT Comments    Progressing with mobility. Some issues with pain in hands due to RA. Continue to recommend SNF.   Follow Up Recommendations  SNF     Equipment Recommendations  None recommended by PT    Recommendations for Other Services OT consult     Precautions / Restrictions Precautions Precautions: Fall;Knee Required Braces or Orthoses: Knee Immobilizer - Right Knee Immobilizer - Right: Discontinue once straight leg raise with < 10 degree lag Restrictions Weight Bearing Restrictions: No RLE Weight Bearing: Weight bearing as tolerated    Mobility  Bed Mobility               General bed mobility comments: oob in recliner  Transfers Overall transfer level: Needs assistance Equipment used: Rolling walker (2 wheeled) Transfers: Sit to/from Stand Sit to Stand: Min assist         General transfer comment: Assist to rise, stabilize, control descent. VCs safety, technique, hand/LE placement. Increased time.   Ambulation/Gait Ambulation/Gait assistance: Min assist Ambulation Distance (Feet): 45 Feet Assistive device: Rolling walker (2 wheeled) Gait Pattern/deviations: Step-to pattern;Antalgic     General Gait Details: Assist to stabilize. Slow gait speed. Distance limited by pain, fatigue.    Stairs            Wheelchair Mobility    Modified Rankin (Stroke Patients Only)       Balance                                    Cognition Arousal/Alertness: Awake/alert Behavior During Therapy: WFL for tasks assessed/performed Overall Cognitive Status: Within Functional Limits for tasks assessed                      Exercises Total Joint Exercises Heel Slides: AAROM;Right;10 reps;Supine SLR: AAROM;Right;10  reps;Supine Knee ROM: ~5-65 degrees    General Comments        Pertinent Vitals/Pain Pain Assessment: 0-10 Pain Score: 6  Pain Location: R knee Pain Descriptors / Indicators: Aching;Sore Pain Intervention(s): Monitored during session;Ice applied    Home Living Family/patient expects to be discharged to:: Unsure Living Arrangements: Spouse/significant other Available Help at Discharge: Family Type of Home: House         Additional Comments: Pt wants to go to SNF; husband has cardiac issues and is limited with helping her. She has high commodes and walk in shower    Prior Function Level of Independence: Independent          PT Goals (current goals can now be found in the care plan section) Acute Rehab PT Goals Patient Stated Goal: rehab to return to independence, then home PT Goal Formulation: With patient Time For Goal Achievement: 10/20/16 Potential to Achieve Goals: Good Progress towards PT goals: Progressing toward goals    Frequency    7X/week      PT Plan Current plan remains appropriate    Co-evaluation             End of Session Equipment Utilized During Treatment: Gait belt;Right knee immobilizer Activity Tolerance: Patient limited by pain;Patient limited by fatigue Patient left: in chair;with call bell/phone within reach;with family/visitor present     Time: 4270-6237  PT Time Calculation (min) (ACUTE ONLY): 23 min  Charges:  $Gait Training: 8-22 mins                    G Codes:      Weston Anna, MPT Pager: [phone]

## 2016-10-06 NOTE — Evaluation (Signed)
Physical Therapy Evaluation Patient Details Name: MIKAYLA CHIUSANO MRN: [id number] DOB: 12/26/1945 Today's Date: 10/06/2016   History of Present Illness  70 yo female s/p R TKA 10/05/16. Hx of R TKA 2016, RA.   Clinical Impression  On eval, pt required Min assist for mobility. She walked ~35' with a RW. Pain rated 7/10 with activity. Pt currently presents with decreased strength, decreased activity tolerance, increased pain, and impaired gait and balance. Recommend ST rehab at SNF to maximize independence and safety with functional mobility prior to returning home.     Follow Up Recommendations SNF    Equipment Recommendations  None recommended by PT    Recommendations for Other Services OT consult     Precautions / Restrictions Precautions Precautions: Fall;Knee Required Braces or Orthoses: Knee Immobilizer - Right Knee Immobilizer - Right: Discontinue once straight leg raise with < 10 degree lag Restrictions Weight Bearing Restrictions: No RLE Weight Bearing: Weight bearing as tolerated      Mobility  Bed Mobility Overal bed mobility: Needs Assistance Bed Mobility: Supine to Sit          General bed mobility comments: oob in recliner  Transfers Overall transfer level: Needs assistance Equipment used: Rolling walker (2 wheeled) Transfers: Sit to/from Stand Sit to Stand: Min assist        General transfer comment: Assist to rise, stabilize, control descent. VCs safety, technique, hand/LE placement. Increased time.   Ambulation/Gait Ambulation/Gait assistance: Min assist Ambulation Distance (Feet): 35 Feet (35'x1, 15'x1) Assistive device: Rolling walker (2 wheeled) Gait Pattern/deviations: Step-to pattern;Antalgic     General Gait Details: Assist to stabilize throughout ambulation distance. Slow gait speed. Distance limited by pain, fatigue.   Stairs            Wheelchair Mobility    Modified Rankin (Stroke Patients Only)       Balance                                              Pertinent Vitals/Pain Pain Assessment: 0-10 Pain Score: 7  Faces Pain Scale: Hurts little more Pain Location: R knee Pain Descriptors / Indicators: Aching;Sore Pain Intervention(s): Limited activity within patient's tolerance;Ice applied;Repositioned    Home Living Family/patient expects to be discharged to:: Unsure Living Arrangements: Spouse/significant other Available Help at Discharge: Family Type of Home: House           Additional Comments: Pt wants to go to SNF; husband has cardiac issues and is limited with helping her. She has high commodes and walk in shower    Prior Function Level of Independence: Independent               Hand Dominance        Extremity/Trunk Assessment   Upper Extremity Assessment: Defer to OT evaluation           Lower Extremity Assessment: RLE deficits/detail RLE Deficits / Details: Ankle DF/PF WFL, hip and knee strength at least 2/5 throughout.     Cervical / Trunk Assessment: Normal  Communication   Communication: No difficulties  Cognition Arousal/Alertness: Awake/alert Behavior During Therapy: WFL for tasks assessed/performed Overall Cognitive Status: Within Functional Limits for tasks assessed                      General Comments      Exercises Total Joint Exercises Ankle  Circles/Pumps: AROM;Both;10 reps;Supine Quad Sets: AROM;Both;10 reps;Supine Hip ABduction/ADduction: AAROM;Right;10 reps;Supine   Assessment/Plan    PT Assessment Patient needs continued PT services  PT Problem List Decreased strength;Decreased mobility;Decreased range of motion;Decreased activity tolerance;Decreased balance;Pain;Decreased knowledge of use of DME;Decreased knowledge of precautions          PT Treatment Interventions DME instruction;Therapeutic activities;Gait training;Therapeutic exercise;Patient/family education;Functional mobility training;Balance training     PT Goals (Current goals can be found in the Care Plan section)  Acute Rehab PT Goals Patient Stated Goal: rehab to return to independence, then home PT Goal Formulation: With patient Time For Goal Achievement: 10/20/16 Potential to Achieve Goals: Good    Frequency 7X/week   Barriers to discharge        Co-evaluation               End of Session Equipment Utilized During Treatment: Gait belt;Right knee immobilizer Activity Tolerance: Patient limited by pain;Patient limited by fatigue Patient left: in chair;with call bell/phone within reach           Time: 8299-3716 PT Time Calculation (min) (ACUTE ONLY): 26 min   Charges:   PT Evaluation $PT Eval Low Complexity: 1 Procedure PT Treatments $Gait Training: 8-22 mins   PT G Codes:        Weston Anna, MPT Pager: [phone]

## 2016-10-06 NOTE — Clinical Social Work Note (Signed)
Clinical Social Work Assessment  Patient Details  Name: [name] MRN: [id number] Date of Birth: 1946-03-08  Date of referral:  10/06/16               Reason for consult:  Discharge Planning                Permission sought to share information with:  Facility Art therapist granted to share information::  Yes, Verbal Permission Granted  Name::        Agency::     Relationship::     Contact Information:     Housing/Transportation Living arrangements for the past 2 months:  Single Family Home Source of Information:  Patient Patient Interpreter Needed:  None Criminal Activity/Legal Involvement Pertinent to Current Situation/Hospitalization:    Significant Relationships:  Spouse Lives with:    Do you feel safe going back to the place where you live?  No (SNF recommended.) Need for family participation in patient care:  No (Coment)  Care giving concerns:  Pt's care cannot be managed at home following hospital d/c.  Social Worker assessment / plan:  Pt hospitalized on 10/05/16 for pre planned right total knee replacement. PT has recommended ST Rehab at d/c. CSW met with pt to assist with d/c planning. Pt has made prior arrangements to have rehab at [facility] at d/c. SNF contacted and d/c plans confirmed. Clinicals sent to [facility] for review. CSW has contacted [employer] ( [employer] ) and authorization for SNF placement has been requested. A decision is pending. CSW will continue to follow to assist with d/c planning to SNF.  Employment status:  Retired Nurse, adult PT Recommendations:  [place] / Referral to community resources:  [place]  Patient/Family's Response to care:  Pt agrees with plan for [employer].  Patient/Family's Understanding of and Emotional Response to Diagnosis, Current Treatment, and Prognosis:  Pt is aware of her medical status. She is pleased surgery is over and all  went well. Pt is motivated to work with therapy and looking for to rehab at [place].  Emotional Assessment Appearance:  Appears stated age Attitude/Demeanor/Rapport:  Other (cooperative) Affect (typically observed):  Pleasant, Appropriate, Calm Orientation:  Oriented to Self, Oriented to Place, Oriented to  Time, Oriented to Situation Alcohol / Substance use:  Not Applicable Psych involvement (Current and /or in the community):  No (Comment)  Discharge Needs  Concerns to be addressed:  Discharge Planning Concerns Readmission within the last 30 days:  No Current discharge risk:  None Barriers to Discharge:  No Barriers Identified   [name], [place] 10/06/2016, 2:03 PM

## 2016-10-06 NOTE — NC FL2 (Signed)
[place] MEDICAID FL2 LEVEL OF CARE SCREENING TOOL     IDENTIFICATION  Patient Name: [name] Birthdate: [birth year] Sex: female Admission Date (Current Location): 10/05/2016  [facility] and [place] Number:  Herbalist and Address:  [facility],  [place] [address] [place]      Provider Number: [id number]  Attending Physician Name and Address:  Gaynelle Arabian, MD  Relative Name and Phone Number:       Current Level of Care: Hospital Recommended Level of Care: [place] Prior Approval Number:    Date Approved/Denied:   PASRR Number: [id number]  Discharge Plan: SNF    Current Diagnoses: Patient Active Problem List   Diagnosis Date Noted  . Palpitations 02/11/2016  . Foot infection 04/30/2015  . OA (osteoarthritis) of knee 02/11/2015  . Preop cardiovascular exam 01/25/2015  . Osteopenia 09/03/2014  . Dyspnea 08/14/2014  . Chronic diastolic congestive heart failure ([place]) 08/14/2014  . Pulmonary hypertension ([place]) 04/25/2014  . Chest pain 03/13/2014  . Systemic sclerosis ([place])   . Osteoarthritis   . Seasonal allergies   . Chronic rheumatic arthritis ([place])   . Raynaud disease   . Hypertension   . Pulmonary fibrosis ([place])   . Arthritis of knee, degenerative 09/30/2011  . Lung disease with systemic sclerosis ([place]) 09/30/2011    Orientation RESPIRATION BLADDER Height & Weight     Self, Time, Situation, Place  Normal Continent Weight: 190 lb (86.2 kg) Height:  5' 3" (160 cm)  BEHAVIORAL SYMPTOMS/MOOD NEUROLOGICAL BOWEL NUTRITION STATUS  Other (Comment) (no behaviors)   Continent Diet  AMBULATORY STATUS COMMUNICATION OF NEEDS Skin   Limited Assist Verbally Surgical wounds                       Personal Care Assistance Level of Assistance  Bathing, Feeding, Dressing Bathing Assistance: Limited assistance Feeding assistance: Independent Dressing Assistance: Limited assistance     Functional Limitations  Info  Sight, Hearing, Speech Sight Info: Adequate Hearing Info: Adequate Speech Info: Adequate    SPECIAL CARE FACTORS FREQUENCY  PT (By licensed PT), OT (By licensed OT)     PT Frequency: 5x wk OT Frequency: 5x wk            Contractures Contractures Info: Not present    Additional Factors Info  Code Status Code Status Info: Full Code             Current Medications (10/06/2016):  This is the current hospital active medication list Current Facility-Administered Medications  Medication Dose Route Frequency Provider Last Rate Last Dose  . 0.9 %  sodium chloride infusion   Intravenous Continuous Gaynelle Arabian, MD 75 mL/hr at 10/05/16 1608    . acetaminophen (TYLENOL) tablet 650 mg  650 mg Oral Q6H PRN Gaynelle Arabian, MD       Or  . acetaminophen (TYLENOL) suppository 650 mg  650 mg Rectal Q6H PRN Gaynelle Arabian, MD      . acetaminophen (TYLENOL) tablet 1,000 mg  1,000 mg Oral Q6H Gaynelle Arabian, MD   1,000 mg at 10/06/16 0512  . albuterol (PROVENTIL) (2.5 MG/3ML) 0.083% nebulizer solution 2.5 mg  2.5 mg Nebulization Q6H PRN Gaynelle Arabian, MD      . azelastine (ASTELIN) 0.1 % nasal spray 2 spray  2 spray Each Nare BID PRN Gaynelle Arabian, MD      . bisacodyl (DULCOLAX) suppository 10 mg  10 mg Rectal Daily PRN Gaynelle Arabian, MD      .  dexamethasone (DECADRON) injection 10 mg  10 mg Intravenous Once Gaynelle Arabian, MD      . diphenhydrAMINE (BENADRYL) 12.5 MG/5ML elixir 12.5-25 mg  12.5-25 mg Oral Q4H PRN Gaynelle Arabian, MD      . docusate sodium (COLACE) capsule 100 mg  100 mg Oral BID Gaynelle Arabian, MD   100 mg at 10/05/16 2225  . furosemide (LASIX) tablet 40 mg  40 mg Oral BID Gaynelle Arabian, MD   40 mg at 10/05/16 1716  . menthol-cetylpyridinium (CEPACOL) lozenge 3 mg  1 lozenge Oral PRN Gaynelle Arabian, MD       Or  . phenol (CHLORASEPTIC) mouth spray 1 spray  1 spray Mouth/Throat PRN Gaynelle Arabian, MD      . methocarbamol (ROBAXIN) tablet 500 mg  500 mg Oral Q6H PRN Gaynelle Arabian, MD   500 mg at 10/05/16 2224   Or  . methocarbamol (ROBAXIN) 500 mg in dextrose 5 % 50 mL IVPB  500 mg Intravenous Q6H PRN Gaynelle Arabian, MD   500 mg at 10/05/16 1251  . metoCLOPramide (REGLAN) tablet 5-10 mg  5-10 mg Oral Q8H PRN Gaynelle Arabian, MD       Or  . metoCLOPramide (REGLAN) injection 5-10 mg  5-10 mg Intravenous Q8H PRN Gaynelle Arabian, MD      . morphine 2 MG/ML injection 1 mg  1 mg Intravenous Q2H PRN Gaynelle Arabian, MD   1 mg at 10/06/16 0513  . ondansetron (ZOFRAN) tablet 4 mg  4 mg Oral Q6H PRN Gaynelle Arabian, MD       Or  . ondansetron [facility]) injection 4 mg  4 mg Intravenous Q6H PRN Gaynelle Arabian, MD      . oxyCODONE (Oxy IR/ROXICODONE) immediate release tablet 5-10 mg  5-10 mg Oral Q3H PRN Gaynelle Arabian, MD   10 mg at 10/05/16 2224  . pantoprazole (PROTONIX) EC tablet 40 mg  40 mg Oral BID Gaynelle Arabian, MD   40 mg at 10/05/16 2225  . polyethylene glycol (MIRALAX / GLYCOLAX) packet 17 g  17 g Oral Daily PRN Gaynelle Arabian, MD      . predniSONE (DELTASONE) tablet 10 mg  10 mg Oral BID WC Gaynelle Arabian, MD      . Derrill Memo ON 10/07/2016] predniSONE (DELTASONE) tablet 5 mg  5 mg Oral BID WC Gaynelle Arabian, MD      . Derrill Memo ON 10/08/2016] predniSONE (DELTASONE) tablet 5 mg  5 mg Oral Q breakfast Gaynelle Arabian, MD      . rivaroxaban Alveda Reasons) tablet 10 mg  10 mg Oral Q breakfast Gaynelle Arabian, MD      . sodium phosphate (FLEET) 7-19 GM/118ML enema 1 enema  1 enema Rectal Once PRN Gaynelle Arabian, MD      . traMADol Veatrice Bourbon) tablet 50-100 mg  50-100 mg Oral Q6H PRN Gaynelle Arabian, MD      . zolpidem (AMBIEN) tablet 5 mg  5 mg Oral QHS PRN Gaynelle Arabian, MD         Discharge Medications: Please see discharge summary for a list of discharge medications.  Relevant Imaging Results:  Relevant Lab Results:   Additional Information SS # [id number]  Josehua Hammar, Randall An, LCSW

## 2016-10-06 NOTE — Progress Notes (Signed)
Subjective: 1 Day Post-Op Procedure(s) (LRB): RIGHT TOTAL KNEE ARTHROPLASTY (Right) Patient reports pain as mild.   Patient seen in rounds for Dr. Wynelle Link. Patient is well, but has had some minor complaints of pain in the knee, requiring pain medications We will start therapy today.  Plan is to go Skilled nursing facility after hospital stay.  Objective: Vital signs in last 24 hours: Temp:  [97.6 F (36.4 C)-98.6 F (37 C)] 98.4 F (36.9 C) (11/07 0522) Pulse Rate:  [63-79] 63 (11/07 0522) Resp:  [14-19] 16 (11/07 0522) BP: (118-162)/(48-86) 131/51 (11/07 0522) SpO2:  [94 %-100 %] 100 % (11/07 0522) Weight:  [86.2 kg (190 lb)] 86.2 kg (190 lb) (11/06 1326)  Intake/Output from previous day:  Intake/Output Summary (Last 24 hours) at 10/06/16 0851 Last data filed at 10/06/16 0600  Gross per 24 hour  Intake          3346.25 ml  Output             2225 ml  Net          1121.25 ml    Intake/Output this shift: No intake/output data recorded.  Labs:  Recent Labs  10/06/16 0421  HGB 13.0    Recent Labs  10/06/16 0421  WBC 14.3*  RBC 4.04  HCT 38.6  PLT 241    Recent Labs  10/06/16 0421  NA 140  K 4.1  CL 106  CO2 27  BUN 12  CREATININE 0.75  GLUCOSE 167*  CALCIUM 8.7*   No results for input(s): LABPT, INR in the last 72 hours.  EXAM General - Patient is Alert, Appropriate and Oriented Extremity - Neurovascular intact Sensation intact distally Intact pulses distally Dorsiflexion/Plantar flexion intact Dressing - dressing C/D/I Motor Function - intact, moving foot and toes well on exam.  Hemovac pulled without difficulty.  Past Medical History:  Diagnosis Date  . Diastolic congestive heart failure ([place])    02/05/15  states no fluid in extremities, breathing same as has been  . GERD (gastroesophageal reflux disease)   . History of blood transfusion   . Hx of cholecystectomy   . Interstitial lung disease ([place])   . Osteoarthritis   . Pulmonary  fibrosis ([place])   . Raynaud disease   . Rheumatoid arthritis(714.0)   . Scleroderma ([place])   . Seasonal allergies   . Shortness of breath dyspnea    states normal for her  . Systemic sclerosis ([facility])     Assessment/Plan: 1 Day Post-Op Procedure(s) (LRB): RIGHT TOTAL KNEE ARTHROPLASTY (Right) Principal Problem:   OA (osteoarthritis) of knee  Estimated body mass index is 33.66 kg/m as calculated from the following:   Height as of this encounter: 5' 3" (1.6 m).   Weight as of this encounter: 86.2 kg (190 lb). Advance diet Up with therapy Plan for discharge tomorrow Discharge to SNF - looking [facility] SN[facility]  DVT Prophylaxis - Xarelto Weight-Bearing as tolerated to right leg D/C O2 and Pulse OX and try on Room Air  Patient has her own [employer] will will order for her to use while at the SNF.  [name], PA-C Orthopaedic Surgery 10/06/2016, 8:51 AM

## 2016-10-07 ENCOUNTER — Inpatient Hospital Stay
Admission: RE | Admit: 2016-10-07 | Discharge: 2016-10-11 | Disposition: A | Discharge status: Other Acute Inpatient Hospital | Payer: Commercial Managed Care - HMO | Source: Ambulatory Visit | Attending: Internal Medicine | Admitting: Internal Medicine

## 2016-10-07 LAB — CBC
HEMATOCRIT: 37.2 % (ref 36.0–46.0)
HEMOGLOBIN: 12.6 g/dL (ref 12.0–15.0)
MCH: 32.6 pg (ref 26.0–34.0)
MCHC: 33.9 g/dL (ref 30.0–36.0)
MCV: 96.1 fL (ref 78.0–100.0)
Platelets: 255 10*3/uL (ref 150–400)
RBC: 3.87 MIL/uL (ref 3.87–5.11)
RDW: 12.6 % (ref 11.5–15.5)
WBC: 17.4 10*3/uL — ABNORMAL HIGH (ref 4.0–10.5)

## 2016-10-07 LAB — BASIC METABOLIC PANEL
ANION GAP: 5 (ref 5–15)
BUN: 14 mg/dL (ref 6–20)
CHLORIDE: 102 mmol/L (ref 101–111)
CO2: 31 mmol/L (ref 22–32)
CREATININE: 0.71 mg/dL (ref 0.44–1.00)
Calcium: 8.3 mg/dL — ABNORMAL LOW (ref 8.9–10.3)
GFR calc non Af Amer: >60 mL/min (ref >60)
Glucose, Bld: 127 mg/dL — ABNORMAL HIGH (ref 65–99)
POTASSIUM: 3.7 mmol/L (ref 3.5–5.1)
Sodium: 138 mmol/L (ref 135–145)

## 2016-10-07 MED ORDER — FLEET ENEMA 7-19 GM/118ML RE ENEM: 1.0000 | ENEMA | Freq: Once | RECTAL | 0 refills | Status: DC | PRN

## 2016-10-07 MED ORDER — ONDANSETRON HCL 4 MG PO TABS: 4.0000 mg | ORAL_TABLET | Freq: Four times a day (QID) | ORAL | 0 refills | Status: DC | PRN

## 2016-10-07 MED ORDER — OXYCODONE HCL 5 MG PO TABS: 5.0000 mg | ORAL_TABLET | ORAL | 0 refills | Status: DC | PRN

## 2016-10-07 MED ORDER — TRAMADOL HCL 50 MG PO TABS: 50.0000 mg | ORAL_TABLET | Freq: Four times a day (QID) | ORAL | 1 refills | Status: DC | PRN

## 2016-10-07 MED ORDER — BISACODYL 10 MG RE SUPP: 10.0000 mg | Freq: Every day | RECTAL | 0 refills | Status: DC | PRN

## 2016-10-07 MED ORDER — RIVAROXABAN 10 MG PO TABS: 10.0000 mg | ORAL_TABLET | Freq: Every day | ORAL | 0 refills | Status: DC

## 2016-10-07 MED ORDER — METHOCARBAMOL 500 MG PO TABS: 500.0000 mg | ORAL_TABLET | Freq: Four times a day (QID) | ORAL | 0 refills | Status: DC | PRN

## 2016-10-07 MED ORDER — DOCUSATE SODIUM 100 MG PO CAPS: 100.0000 mg | ORAL_CAPSULE | Freq: Two times a day (BID) | ORAL | 0 refills | Status: DC

## 2016-10-07 MED ORDER — POLYETHYLENE GLYCOL 3350 17 G PO PACK: 17.0000 g | PACK | Freq: Every day | ORAL | 0 refills | Status: DC | PRN

## 2016-10-07 NOTE — Clinical Social Work Placement (Signed)
   CLINICAL SOCIAL WORK PLACEMENT  NOTE  Date:  10/07/2016  Patient Details  Name: [name] MRN: [id number] Date of Birth: [birth year]  Clinical Social Work is seeking post-discharge placement for this patient at the [place] level of care (*CSW will initial, date and re-position this form in  chart as items are completed):  No   Patient/family provided with [facility] Work Department's list of facilities offering this level of care within the geographic area requested by the patient (or if unable, by the patient's family).  Yes   Patient/family informed of their freedom to choose among providers that offer the needed level of care, that participate in Medicare, Medicaid or managed care program needed by the patient, have an available bed and are willing to accept the patient.  Yes   Patient/family informed of [facility]'s ownership interest in [facility] and [facility], as well as of the fact that they are under no obligation to receive care at these facilities.  PASRR submitted to EDS on       PASRR number received on       Existing PASRR number confirmed on 10/06/16     FL2 transmitted to all facilities in geographic area requested by pt/family on 10/06/16     FL2 transmitted to all facilities within larger geographic area on       Patient informed that his/her managed care company has contracts with or will negotiate with certain facilities, including the following:        Yes   Patient/family informed of bed offers received.  Patient chooses bed at [facility]     Physician recommends and patient chooses bed at      Patient to be transferred to [facility] on 10/07/16.  Patient to be transferred to facility by [place]     Patient family notified on 10/07/16 of transfer.  Name of family member notified:  SPOUSE     PHYSICIAN       Additional Comment: Pt / spouse are in agreement with d/c to [facility]   today. PT approved transport by car. D/C Summary sent to SNF for review. Scripts included in d/c packet. # for report provided to nsg. [employer] provided authorization for SNF placement.   _______________________________________________ [name], [place] 10/07/2016, 10:31 AM

## 2016-10-07 NOTE — Progress Notes (Signed)
Subjective: 2 Days Post-Op Procedure(s) (LRB): RIGHT TOTAL KNEE ARTHROPLASTY (Right) Patient reports pain as mild.   Patient seen in rounds with Dr. Wynelle Link. No much appetite. Patient is well, but has had some minor complaints of pain in the knee, requiring pain medications Patient is ready to go home  Objective: Vital signs in last 24 hours: Temp:  [98 F (36.7 C)-98.9 F (37.2 C)] 98.9 F (37.2 C) (11/08 0514) Pulse Rate:  [59-86] 86 (11/08 0514) Resp:  [18] 18 (11/08 0514) BP: (129-160)/(54-68) 160/55 (11/08 0514) SpO2:  [95 %-97 %] 95 % (11/08 0514)  Intake/Output from previous day:  Intake/Output Summary (Last 24 hours) at 10/07/16 0714 Last data filed at 10/07/16 0515  Gross per 24 hour  Intake              840 ml  Output             1825 ml  Net             -985 ml    Intake/Output this shift: No intake/output data recorded.  Labs:  Recent Labs  10/06/16 0421 10/07/16 0415  HGB 13.0 12.6    Recent Labs  10/06/16 0421 10/07/16 0415  WBC 14.3* 17.4*  RBC 4.04 3.87  HCT 38.6 37.2  PLT 241 255    Recent Labs  10/06/16 0421 10/07/16 0415  NA 140 138  K 4.1 3.7  CL 106 102  CO2 27 31  BUN 12 14  CREATININE 0.75 0.71  GLUCOSE 167* 127*  CALCIUM 8.7* 8.3*   No results for input(s): LABPT, INR in the last 72 hours.  EXAM: General - Patient is Alert, Appropriate and Oriented Extremity - Neurovascular intact Sensation intact distally Dorsiflexion/Plantar flexion intact Incision - clean, dry, no drainage Motor Function - intact, moving foot and toes well on exam.   Assessment/Plan: 2 Days Post-Op Procedure(s) (LRB): RIGHT TOTAL KNEE ARTHROPLASTY (Right) Procedure(s) (LRB): RIGHT TOTAL KNEE ARTHROPLASTY (Right) Past Medical History:  Diagnosis Date  . Diastolic congestive heart failure ([place])    02/05/15  states no fluid in extremities, breathing same as has been  . GERD (gastroesophageal reflux disease)   . History of blood transfusion     . Hx of cholecystectomy   . Interstitial lung disease ([place])   . Osteoarthritis   . Pulmonary fibrosis ([place])   . Raynaud disease   . Rheumatoid arthritis(714.0)   . Scleroderma ([place])   . Seasonal allergies   . Shortness of breath dyspnea    states normal for her  . Systemic sclerosis ([facility])    Principal Problem:   OA (osteoarthritis) of knee  Estimated body mass index is 33.66 kg/m as calculated from the following:   Height as of this encounter: 5' 3" (1.6 m).   Weight as of this encounter: 86.2 kg (190 lb). Up with therapy Discharge to SNF Diet - Cardiac diet Follow up - in 2 weeks Activity - WBAT Disposition - Skilled nursing facility Condition Upon Discharge - Good D/C Meds - See DC Summary DVT Prophylaxis - Xarelto  [name], PA-C Orthopaedic Surgery 10/07/2016, 7:14 AM

## 2016-10-07 NOTE — Discharge Summary (Signed)
Physician Discharge Summary   Patient ID: JONELLE BANN MRN: [id number] DOB/AGE: [birth year] 70 y.o.  Admit date: 10/05/2016 Discharge date: 10/07/2016  Primary Diagnosis:  Osteoarthritis  Right knee(s)  Admission Diagnoses:  Past Medical History:  Diagnosis Date  . Diastolic congestive heart failure ([place])    02/05/15  states no fluid in extremities, breathing same as has been  . GERD (gastroesophageal reflux disease)   . History of blood transfusion   . Hx of cholecystectomy   . Interstitial lung disease ([place])   . Osteoarthritis   . Pulmonary fibrosis ([place])   . Raynaud disease   . Rheumatoid arthritis(714.0)   . Scleroderma ([place])   . Seasonal allergies   . Shortness of breath dyspnea    states normal for her  . Systemic sclerosis ([place])    Discharge Diagnoses:   Principal Problem:   OA (osteoarthritis) of knee  Estimated body mass index is 33.66 kg/m as calculated from the following:   Height as of this encounter: 5' 3" (1.6 m).   Weight as of this encounter: 86.2 kg (190 lb).  Procedure:  Procedure(s) (LRB): RIGHT TOTAL KNEE ARTHROPLASTY (Right)   Consults: None  HPI: [name] is a 70 y.o. year old female with end stage OA of her right knee with progressively worsening pain and dysfunction. She has constant pain, with activity and at rest and significant functional deficits with difficulties even with ADLs. She has had extensive non-op management including analgesics, injections of cortisone and viscosupplements, and home exercise program, but remains in significant pain with significant dysfunction.Radiographs show bone on bone arthritis medial and patellofemoral. She presents now for right Total Knee Arthroplasty.    Laboratory Data: Admission on 10/05/2016  Component Date Value Ref Range Status  . WBC 10/06/2016 14.3* 4.0 - 10.5 K/uL Final  . RBC 10/06/2016 4.04  3.87 - 5.11 MIL/uL Final  . Hemoglobin 10/06/2016 13.0  12.0 - 15.0 g/dL Final  . HCT  10/06/2016 38.6  36.0 - 46.0 % Final  . MCV 10/06/2016 95.5  78.0 - 100.0 fL Final  . MCH 10/06/2016 32.2  26.0 - 34.0 pg Final  . MCHC 10/06/2016 33.7  30.0 - 36.0 g/dL Final  . RDW 10/06/2016 12.4  11.5 - 15.5 % Final  . Platelets 10/06/2016 241  150 - 400 K/uL Final  . Sodium 10/06/2016 140  135 - 145 mmol/L Final  . Potassium 10/06/2016 4.1  3.5 - 5.1 mmol/L Final  . Chloride 10/06/2016 106  101 - 111 mmol/L Final  . CO2 10/06/2016 27  22 - 32 mmol/L Final  . Glucose, Bld 10/06/2016 167* 65 - 99 mg/dL Final  . BUN 10/06/2016 12  6 - 20 mg/dL Final  . Creatinine, Ser 10/06/2016 0.75  0.44 - 1.00 mg/dL Final  . Calcium 10/06/2016 8.7* 8.9 - 10.3 mg/dL Final  . GFR calc non Af Amer 10/06/2016 >60  >60 mL/min Final  . GFR calc Af Amer 10/06/2016 >60  >60 mL/min Final   Comment: (NOTE) The eGFR has been calculated using the CKD EPI equation. This calculation has not been validated in all clinical situations. eGFR's persistently <60 mL/min signify possible Chronic Kidney Disease.   . Anion gap 10/06/2016 7  5 - 15 Final  . WBC 10/07/2016 17.4* 4.0 - 10.5 K/uL Final  . RBC 10/07/2016 3.87  3.87 - 5.11 MIL/uL Final  . Hemoglobin 10/07/2016 12.6  12.0 - 15.0 g/dL Final  . HCT 10/07/2016 37.2  36.0 - 46.0 %  Final  . MCV 10/07/2016 96.1  78.0 - 100.0 fL Final  . MCH 10/07/2016 32.6  26.0 - 34.0 pg Final  . MCHC 10/07/2016 33.9  30.0 - 36.0 g/dL Final  . RDW 10/07/2016 12.6  11.5 - 15.5 % Final  . Platelets 10/07/2016 255  150 - 400 K/uL Final  . Sodium 10/07/2016 138  135 - 145 mmol/L Final  . Potassium 10/07/2016 3.7  3.5 - 5.1 mmol/L Final  . Chloride 10/07/2016 102  101 - 111 mmol/L Final  . CO2 10/07/2016 31  22 - 32 mmol/L Final  . Glucose, Bld 10/07/2016 127* 65 - 99 mg/dL Final  . BUN 10/07/2016 14  6 - 20 mg/dL Final  . Creatinine, Ser 10/07/2016 0.71  0.44 - 1.00 mg/dL Final  . Calcium 10/07/2016 8.3* 8.9 - 10.3 mg/dL Final  . GFR calc non Af Amer 10/07/2016 >60  >60 mL/min  Final  . GFR calc Af Amer 10/07/2016 >60  >60 mL/min Final   Comment: (NOTE) The eGFR has been calculated using the CKD EPI equation. This calculation has not been validated in all clinical situations. eGFR's persistently <60 mL/min signify possible Chronic Kidney Disease.   Georgiann Hahn gap 10/07/2016 5  5 - 15 Final  Hospital Outpatient Visit on 09/25/2016  Component Date Value Ref Range Status  . MRSA, PCR 09/25/2016 NEGATIVE  NEGATIVE Final  . Staphylococcus aureus 09/25/2016 NEGATIVE  NEGATIVE Final   Comment:        The Xpert SA Assay (FDA approved for NASAL specimens in patients over 51 years of age), is one component of a comprehensive surveillance program.  Test performance has been validated by [facility] for patients greater than or equal to 32 year old. It is not intended to diagnose infection nor to guide or monitor treatment.   Marland Kitchen aPTT 09/25/2016 37* 24 - 36 seconds Final   Comment:        IF BASELINE aPTT IS ELEVATED, SUGGEST PATIENT RISK ASSESSMENT BE USED TO DETERMINE APPROPRIATE ANTICOAGULANT THERAPY.   . WBC 09/25/2016 8.5  4.0 - 10.5 K/uL Final  . RBC 09/25/2016 4.45  3.87 - 5.11 MIL/uL Final  . Hemoglobin 09/25/2016 14.6  12.0 - 15.0 g/dL Final  . HCT 09/25/2016 42.3  36.0 - 46.0 % Final  . MCV 09/25/2016 95.1  78.0 - 100.0 fL Final  . MCH 09/25/2016 32.8  26.0 - 34.0 pg Final  . MCHC 09/25/2016 34.5  30.0 - 36.0 g/dL Final  . RDW 09/25/2016 12.6  11.5 - 15.5 % Final  . Platelets 09/25/2016 275  150 - 400 K/uL Final  . Sodium 09/25/2016 139  135 - 145 mmol/L Final  . Potassium 09/25/2016 3.7  3.5 - 5.1 mmol/L Final  . Chloride 09/25/2016 101  101 - 111 mmol/L Final  . CO2 09/25/2016 31  22 - 32 mmol/L Final  . Glucose, Bld 09/25/2016 89  65 - 99 mg/dL Final  . BUN 09/25/2016 16  6 - 20 mg/dL Final  . Creatinine, Ser 09/25/2016 0.92  0.44 - 1.00 mg/dL Final  . Calcium 09/25/2016 8.9  8.9 - 10.3 mg/dL Final  . Total Protein 09/25/2016 7.5  6.5 - 8.1  g/dL Final  . Albumin 09/25/2016 4.4  3.5 - 5.0 g/dL Final  . AST 09/25/2016 34  15 - 41 U/L Final  . ALT 09/25/2016 27  14 - 54 U/L Final  . Alkaline Phosphatase 09/25/2016 75  38 - 126 U/L Final  . Total  Bilirubin 09/25/2016 0.6  0.3 - 1.2 mg/dL Final  . GFR calc non Af Amer 09/25/2016 >60  >60 mL/min Final  . GFR calc Af Amer 09/25/2016 >60  >60 mL/min Final   Comment: (NOTE) The eGFR has been calculated using the CKD EPI equation. This calculation has not been validated in all clinical situations. eGFR's persistently <60 mL/min signify possible Chronic Kidney Disease.   . Anion gap 09/25/2016 7  5 - 15 Final  . Prothrombin Time 09/25/2016 13.8  11.4 - 15.2 seconds Final  . INR 09/25/2016 1.05   Final  . ABO/RH(D) 10/05/2016 A POS   Final  . Antibody Screen 10/05/2016 NEG   Final  . Sample Expiration 10/05/2016 10/08/2016   Final  . Extend sample reason 10/05/2016 NO TRANSFUSIONS OR PREGNANCY IN THE PAST 3 MONTHS   Final  . Color, Urine 09/25/2016 YELLOW  YELLOW Final  . APPearance 09/25/2016 CLEAR  CLEAR Final  . Specific Gravity, Urine 09/25/2016 1.012  1.005 - 1.030 Final  . pH 09/25/2016 5.5  5.0 - 8.0 Final  . Glucose, UA 09/25/2016 NEGATIVE  NEGATIVE mg/dL Final  . Hgb urine dipstick 09/25/2016 NEGATIVE  NEGATIVE Final  . Bilirubin Urine 09/25/2016 NEGATIVE  NEGATIVE Final  . Ketones, ur 09/25/2016 NEGATIVE  NEGATIVE mg/dL Final  . Protein, ur 09/25/2016 NEGATIVE  NEGATIVE mg/dL Final  . Nitrite 09/25/2016 NEGATIVE  NEGATIVE Final  . Leukocytes, UA 09/25/2016 TRACE* NEGATIVE Final  . Squamous Epithelial / LPF 09/25/2016 0-5* NONE SEEN Final  . WBC, UA 09/25/2016 0-5  0 - 5 WBC/hpf Final  . RBC / HPF 09/25/2016 NONE SEEN  0 - 5 RBC/hpf Final  . Bacteria, UA 09/25/2016 NONE SEEN  NONE SEEN Final  . Casts 09/25/2016 HYALINE CASTS* NEGATIVE Final  Clinical Support on 09/22/2016  Component Date Value Ref Range Status  . FVC-Pre 09/22/2016 1.94  L Preliminary  .  FVC-%Pred-Pre 09/22/2016 68  % Preliminary  . FVC-Post 09/22/2016 1.92  L Preliminary  . FVC-%Pred-Post 09/22/2016 68  % Preliminary  . FVC-%Change-Post 09/22/2016 0  % Preliminary  . FEV1-Pre 09/22/2016 1.44  L Preliminary  . FEV1-%Pred-Pre 09/22/2016 67  % Preliminary  . FEV1-Post 09/22/2016 1.51  L Preliminary  . FEV1-%Pred-Post 09/22/2016 70  % Preliminary  . FEV1-%Change-Post 09/22/2016 4  % Preliminary  . FEV6-Pre 09/22/2016 1.91  L Preliminary  . FEV6-%Pred-Pre 09/22/2016 71  % Preliminary  . FEV6-Post 09/22/2016 1.92  L Preliminary  . FEV6-%Pred-Post 09/22/2016 71  % Preliminary  . FEV6-%Change-Post 09/22/2016 0  % Preliminary  . Pre FEV1/FVC ratio 09/22/2016 74  % Preliminary  . FEV1FVC-%Pred-Pre 09/22/2016 97  % Preliminary  . Post FEV1/FVC ratio 09/22/2016 79  % Preliminary  . FEV1FVC-%Change-Post 09/22/2016 5  % Preliminary  . Pre FEV6/FVC Ratio 09/22/2016 99  % Preliminary  . FEV6FVC-%Pred-Pre 09/22/2016 103  % Preliminary  . Post FEV6/FVC ratio 09/22/2016 100  % Preliminary  . FEV6FVC-%Pred-Post 09/22/2016 104  % Preliminary  . FEV6FVC-%Change-Post 09/22/2016 1  % Preliminary  . FEF 25-75 Pre 09/22/2016 1.04  L/sec Preliminary  . FEF2575-%Pred-Pre 09/22/2016 55  % Preliminary  . FEF 25-75 Post 09/22/2016 1.27  L/sec Preliminary  . FEF2575-%Pred-Post 09/22/2016 67  % Preliminary  . FEF2575-%Change-Post 09/22/2016 22  % Preliminary  . RV 09/22/2016 1.48  L Preliminary  . RV % pred 09/22/2016 72  % Preliminary  . TLC 09/22/2016 3.40  L Preliminary  . TLC % pred 09/22/2016 71  % Preliminary  . DLCO  unc 09/22/2016 15.97  ml/min/mmHg Preliminary  . DLCO unc % pred 09/22/2016 74  % Preliminary  . DLCO cor 09/22/2016 15.87  ml/min/mmHg Preliminary  . DLCO cor % pred 09/22/2016 73  % Preliminary  . DL/VA 09/22/2016 5.18  ml/min/mmHg/L Preliminary  . DL/VA % pred 09/22/2016 114  % Preliminary     X-Rays:No results found.  EKG: Orders placed or performed in visit on  09/04/16  . EKG 12-Lead     Hospital Course: VIOLA KINNICK is a 70 y.o. who was admitted to [facility]. They were brought to the operating room on 10/05/2016 and underwent Procedure(s): RIGHT TOTAL KNEE ARTHROPLASTY.  Patient tolerated the procedure well and was later transferred to the recovery room and then to the orthopaedic floor for postoperative care.  They were given PO and IV analgesics for pain control following their surgery.  They were given 24 hours of postoperative antibiotics of  Anti-infectives    Start     Dose/Rate Route Frequency Ordered Stop   10/05/16 1600  cephALEXin (KEFLEX) capsule 500 mg  Status:  Discontinued    Comments:  Has about 3 days left to take     500 mg Oral 3 times daily 10/05/16 1338 10/05/16 1343   10/05/16 1600  ceFAZolin (ANCEF) IVPB 2g/100 mL premix     2 g 200 mL/hr over 30 Minutes Intravenous Every 6 hours 10/05/16 1338 10/05/16 2255   10/05/16 0719  ceFAZolin (ANCEF) IVPB 2g/100 mL premix     2 g 200 mL/hr over 30 Minutes Intravenous On call to O.R. 10/05/16 0719 10/05/16 1011     and started on DVT prophylaxis in the form of Xarelto.   PT and OT were ordered for total joint protocol.  Discharge planning consulted to help with postop disposition and equipment needs. Social worker consulted to assist with placement of patient. Patient had a decent night on the evening of surgery.  They started to get up OOB with therapy on day one. Hemovac drain was pulled without difficulty.  Continued to work with therapy into day two.  Dressing was changed on day two and the incision was healing well.  Patient was seen in rounds on POD 2 by Dr. Wynelle Link and was ready to go to the SNF of choice.  Patient has her own [facility] and will order for her to use while at the SNF.  Diet - Cardiac diet Follow up - in 2 weeks Activity - WBAT Disposition - Skilled nursing facility Condition Upon Discharge - Good D/C Meds - See DC Summary DVT  Prophylaxis - Xarelto   Discharge Instructions    CPM    Complete by:  As directed    Continuous passive motion machine (CPM):      Use the CPM from Zero Degrees to 45 Degrees for 6 hours per day.      You may increase by 5 Degrees per day.  Break it up into 2 or 3 sessions per day.      Use CPM for 2 weeks or until you are told to stop.   Call MD / Call 911    Complete by:  As directed    If you experience chest pain or shortness of breath, CALL 911 and be transported to the hospital emergency room.  If you develope a fever above 101 F, pus (white drainage) or increased drainage or redness at the wound, or calf pain, call your surgeon's office.   Change  dressing    Complete by:  As directed    Change dressing daily with sterile 4 x 4 inch gauze dressing and apply TED hose. Do not submerge the incision under water.   Change dressing    Complete by:  As directed    You may change your dressing dressing daily with sterile 4 x 4 inch gauze dressing and paper tape.  Do not submerge the incision under water.   Constipation Prevention    Complete by:  As directed    Drink plenty of fluids.  Prune juice may be helpful.  You may use a stool softener, such as Colace (over the counter) 100 mg twice a day.  Use MiraLax (over the counter) for constipation as needed.   Diet - low sodium heart healthy    Complete by:  As directed    Discharge instructions    Complete by:  As directed    Pick up stool softner and laxative for home use following surgery while on pain medications. Do not submerge incision under water. Please use good hand washing techniques while changing dressing each day. May shower starting three days after surgery. Please use a clean towel to pat the incision dry following showers. Continue to use ice for pain and swelling after surgery. Do not use any lotions or creams on the incision until instructed by your surgeon.   Postoperative Constipation Protocol  Constipation -  defined medically as fewer than three stools per week and severe constipation as less than one stool per week.  One of the most common issues patients have following surgery is constipation.  Even if you have a regular bowel pattern at home, your normal regimen is likely to be disrupted due to multiple reasons following surgery.  Combination of anesthesia, postoperative narcotics, change in appetite and fluid intake all can affect your bowels.  In order to avoid complications following surgery, here are some recommendations in order to help you during your recovery period.  Colace (docusate) - Pick up an over-the-counter form of Colace or another stool softener and take twice a day as long as you are requiring postoperative pain medications.  Take with a full glass of water daily.  If you experience loose stools or diarrhea, hold the colace until you stool forms back up.  If your symptoms do not get better within 1 week or if they get worse, check with your doctor.  Dulcolax (bisacodyl) - Pick up over-the-counter and take as directed by the product packaging as needed to assist with the movement of your bowels.  Take with a full glass of water.  Use this product as needed if not relieved by Colace only.   MiraLax (polyethylene glycol) - Pick up over-the-counter to have on hand.  MiraLax is a solution that will increase the amount of water in your bowels to assist with bowel movements.  Take as directed and can mix with a glass of water, juice, soda, coffee, or tea.  Take if you go more than two days without a movement. Do not use MiraLax more than once per day. Call your doctor if you are still constipated or irregular after using this medication for 7 days in a row.  If you continue to have problems with postoperative constipation, please contact the office for further assistance and recommendations.  If you experience "the worst abdominal pain ever" or develop nausea or vomiting, please contact the  office immediatly for further recommendations for treatment.   Take Xarelto for  two and a half more weeks, then discontinue Xarelto. Once the patient has completed the Xarelto, they may resume the 81 mg Aspirin.  When discharged from the skilled rehab facility, please have the facility set up the patient's [place] prior to being released.  Please make sure this gets set up prior to release in order to avoid any lapse of therapy following the rehab stay.  Also provide the patient with their medications at time of release from the facility to include their pain medication, the muscle relaxants, and their blood thinner medication.  If the patient is still at the rehab facility at time of follow up appointment, please also assist the patient in arranging follow up appointment in our office and any transportation needs. ICE to the affected knee or hip every three hours for 30 minutes at a time and then as needed for pain and swelling.   Do not put a pillow under the knee. Place it under the heel.    Complete by:  As directed    Do not sit on low chairs, stoools or toilet seats, as it may be difficult to get up from low surfaces    Complete by:  As directed    Driving restrictions    Complete by:  As directed    No driving until released by the physician.   Increase activity slowly as tolerated    Complete by:  As directed    Lifting restrictions    Complete by:  As directed    No lifting until released by the physician.   Patient may shower    Complete by:  As directed    You may shower without a dressing once there is no drainage.  Do not wash over the wound.  If drainage remains, do not shower until drainage stops.   TED hose    Complete by:  As directed    Use stockings (TED hose) for 3 weeks on both leg(s).  You may remove them at night for sleeping.   TED hose    Complete by:  As directed    Use stockings (TED hose) for 3 weeks on both leg(s).  You may remove them at  night for sleeping.   Weight bearing as tolerated    Complete by:  As directed    Laterality:  right   Extremity:  Lower       Medication List    STOP taking these medications   aspirin EC 81 MG tablet   cephALEXin 500 MG capsule Commonly known as:  KEFLEX   HUMIRA [place]   hydroxychloroquine 200 MG tablet Commonly known as:  PLAQUENIL   sulfamethoxazole-trimethoprim 800-160 MG tablet Commonly known as:  BACTRIM DS     TAKE these medications   acetaminophen 325 MG tablet Commonly known as:  TYLENOL Take 2 tablets (650 mg total) by mouth every 6 (six) hours as needed for mild pain (or Fever >/= 101).   albuterol 108 (90 Base) MCG/ACT inhaler Commonly known as:  PROVENTIL HFA;VENTOLIN HFA Inhale 2 puffs into the lungs every 6 (six) hours as needed for wheezing.   azelastine 0.1 % nasal spray Commonly known as:  ASTELIN Place 2 sprays into the nose 2 (two) times daily as needed for allergies. Use in each nostril as directed   bisacodyl 10 MG suppository Commonly known as:  DULCOLAX Place 1 suppository (10 mg total) rectally daily as needed for moderate constipation.   docusate sodium 100 MG capsule  Commonly known as:  COLACE Take 1 capsule (100 mg total) by mouth 2 (two) times daily.   furosemide 40 MG tablet Commonly known as:  LASIX TAKE 1 TABLET TWICE DAILY   methocarbamol 500 MG tablet Commonly known as:  ROBAXIN Take 1 tablet (500 mg total) by mouth every 6 (six) hours as needed for muscle spasms.   ondansetron 4 MG tablet Commonly known as:  ZOFRAN Take 1 tablet (4 mg total) by mouth every 6 (six) hours as needed for nausea.   oxyCODONE 5 MG immediate release tablet Commonly known as:  Oxy IR/ROXICODONE Take 1-2 tablets (5-10 mg total) by mouth every 3 (three) hours as needed for moderate pain or severe pain.   pantoprazole 40 MG tablet Commonly known as:  PROTONIX Take 40 mg by mouth 2 (two) times daily.   polyethylene glycol packet Commonly known  as:  MIRALAX / GLYCOLAX Take 17 g by mouth daily as needed for mild constipation.   predniSONE 5 MG tablet Commonly known as:  DELTASONE Take 5 mg by mouth daily with breakfast.   rivaroxaban 10 MG Tabs tablet Commonly known as:  XARELTO Take 1 tablet (10 mg total) by mouth daily with breakfast. Take Xarelto for two and a half more weeks, then discontinue Xarelto. Once the patient has completed the Xarelto, they may resume the 81 mg Aspirin.   sodium phosphate 7-19 GM/118ML Enem Place 133 mLs (1 enema total) rectally once as needed for severe constipation.   traMADol 50 MG tablet Commonly known as:  ULTRAM Take 1-2 tablets (50-100 mg total) by mouth every 6 (six) hours as needed for moderate pain.   zolpidem 10 MG tablet Commonly known as:  AMBIEN Take 10 mg by mouth at bedtime as needed for sleep.       Contact information for follow-up providers    Gearlean Alf, MD. Schedule an appointment as soon as possible for a visit on 10/20/2016.   Specialty:  Orthopedic Surgery Why:  Call office ASAP at [phone] to setup appointment with Dr. Wynelle Link. Contact information: [address] [place] [address] [phone]            Contact information for after-discharge care    [facility] Follow up.   Specialty:  Skilled Nursing Facility Contact information: [address] [place] [phone]                  Signed: [name], PA-C Orthopaedic Surgery 10/07/2016, 7:34 AM

## 2016-10-07 NOTE — Care Management Note (Signed)
Case Management Note  Patient Details  Name: [name] MRN: [id number] Date of Birth: 14-Oct-1946  Subjective/Objective:                  RIGHT TOTAL KNEE ARTHROPLASTY (Right) Action/Plan: Discharge planning Expected Discharge Date:  10/07/16               Expected Discharge Plan:  [place]  In-House Referral:     Discharge planning Services  CM Consult  Post Acute Care Choice:    Choice offered to:     DME Arranged:    DME Agency:     HH Arranged:    [place] Agency:     Status of Service:  Completed, signed off  If discussed at [employer] of Stay Meetings, dates discussed:    Additional Comments: CM notes pt to go to SNF; CSW aware and arranging.  No other CM needs were communicated. Dellie Catholic, RN 10/07/2016, 9:31 AM

## 2016-10-07 NOTE — Progress Notes (Signed)
Physical Therapy Treatment Patient Details Name: [name] MRN: [id number] DOB: Sep 12, 1946 Today's Date: 10/07/2016    History of Present Illness 70 yo female s/p R TKA 10/05/16. Hx of L TKA 2016    PT Comments    Pt with increased soreness and some lightheadedness on today. Vitals: 132/54, 75 bpm, 95% on RA after walking a short distance. Continue to recommend ST rehab at [facility].   Follow Up Recommendations  SNF     Equipment Recommendations  None recommended by PT    Recommendations for Other Services       Precautions / Restrictions Precautions Precautions: Fall;Knee Required Braces or Orthoses: Knee Immobilizer - Right Knee Immobilizer - Right: Discontinue once straight leg raise with < 10 degree lag Restrictions Weight Bearing Restrictions: No RLE Weight Bearing: Weight bearing as tolerated    Mobility  Bed Mobility Overal bed mobility: Needs Assistance Bed Mobility: Supine to Sit     Supine to sit: Min assist     General bed mobility comments: Assist for R LE. Increased time. Sat EOB a few minutes before standing up  Transfers Overall transfer level: Needs assistance Equipment used: Rolling walker (2 wheeled) Transfers: Sit to/from Stand Sit to Stand: Min assist         General transfer comment: Assist to rise, stabilize, control descent. VCs safety, technique, hand/LE placement. Increased time.   Ambulation/Gait Ambulation/Gait assistance: Min assist Ambulation Distance (Feet): 20 Feet Assistive device: Rolling walker (2 wheeled) Gait Pattern/deviations: Step-to pattern;Antalgic     General Gait Details: Assist to stabilize. Slow gait speed. Distance limited by pain, fatigue, dizziness/lightheadedness.   Stairs            Wheelchair Mobility    Modified Rankin (Stroke Patients Only)       Balance                                    Cognition Arousal/Alertness: Awake/alert Behavior During Therapy: WFL for tasks  assessed/performed Overall Cognitive Status: Within Functional Limits for tasks assessed                      Exercises Total Joint Exercises Ankle Circles/Pumps: AROM;Both;10 reps;Supine Quad Sets: AROM;Both;10 reps;Supine Heel Slides: AAROM;Right;10 reps;Supine Hip ABduction/ADduction: AAROM;Right;10 reps;Supine Straight Leg Raises: AAROM;Right;10 reps;Supine Goniometric ROM: ~5-50 degrees    General Comments        Pertinent Vitals/Pain Pain Assessment: 0-10 Pain Score: 6  Pain Location: R knee Pain Descriptors / Indicators: Aching;Sore Pain Intervention(s): Monitored during session;Repositioned;Ice applied    Home Living                      Prior Function            PT Goals (current goals can now be found in the care plan section) Progress towards PT goals: Progressing toward goals    Frequency    7X/week      PT Plan Current plan remains appropriate    Co-evaluation             End of Session Equipment Utilized During Treatment: Gait belt;Right knee immobilizer Activity Tolerance: Patient limited by pain;Patient limited by fatigue Patient left: in chair;with call bell/phone within reach     Time: 0943-1014 PT Time Calculation (min) (ACUTE ONLY): 31 min  Charges:  $Gait Training: 8-22 mins $Therapeutic Exercise: 8-22 mins  G Codes:      Weston Anna, MPT Pager: [phone]

## 2016-10-08 ENCOUNTER — Encounter: Payer: Self-pay | Admitting: Internal Medicine

## 2016-10-08 NOTE — Progress Notes (Signed)
  This encounter was created in error - please disregard.

## 2016-10-08 NOTE — Progress Notes (Signed)
Provider:  Veleta Miners Location:   [place] Room Number: 144/P Place of Service:  SNF (31)  PCP: Asencion Noble, MD Patient Care Team: Asencion Noble, MD as PCP - General (Internal Medicine) Elsie Stain, MD as Attending Physician (Pulmonary Disease)  Extended Emergency Contact Information Primary Emergency Contact: Ahart,Joseph Address: [address]          [place], [place] [address] Johnnette Litter of [place] Phone: [phone] Relation: Spouse Secondary Emergency Contact: [place] of [place] Mobile Phone: [phone] Relation: Son  Code Status: Full Code Goals of Care: Advanced Directive information Advanced Directives 10/08/2016  Does patient have an advance directive? Yes  Type of Advance Directive (No Data)  Does patient want to make changes to advanced directive? No - Patient declined  Copy of advanced directive(s) in chart? Yes  Pre-existing out of facility DNR order (yellow form or pink MOST form) -      Chief Complaint  Patient presents with  . New Admit To SNF    HPI: Patient is a 70 y.o. female seen today for admission to SNF after Elective Right Total Knee Arthroplasty for Right Osteoarthritis of knee. Patient has H/O Diastolic CHF, Rheumatoid arthritis , Scleroderma with Pulmonary Fibrosis and Systemic sclerosis. She failed the Conservative therapy for her knee and underwent surgery on 10/05/16. Patient admitted to SNF for Therapy.  Past Medical History:  Diagnosis Date  . Diastolic congestive heart failure ([place])    02/05/15  states no fluid in extremities, breathing same as has been  . GERD (gastroesophageal reflux disease)   . History of blood transfusion   . Hx of cholecystectomy   . Interstitial lung disease ([place])   . Osteoarthritis   . Pulmonary fibrosis ([place])   . Raynaud disease   . Rheumatoid arthritis(714.0)   . Scleroderma ([place])   . Seasonal allergies   . Shortness of breath dyspnea     states normal for her  . Systemic sclerosis [facility])    Past Surgical History:  Procedure Laterality Date  . ABDOMINAL HYSTERECTOMY    . BLADDER SUSPENSION    . CARPAL TUNNEL RELEASE    . LAPAROSCOPIC CHOLECYSTECTOMY    . LEFT HEART CATHETERIZATION WITH CORONARY/GRAFT ANGIOGRAM  03/15/2014   Procedure: LEFT HEART CATHETERIZATION WITH Beatrix Fetters;  Surgeon: Burnell Blanks, MD;  Location: [facility] CATH LAB;  Service: Cardiovascular;;  . TONSILLECTOMY    . TOTAL KNEE ARTHROPLASTY Left 02/11/2015   Procedure: LEFT TOTAL KNEE ARTHROPLASTY, RIGHT KNEE CORTISONE INJECTION;  Surgeon: Gaynelle Arabian, MD;  Location: [facility] ORS;  Service: Orthopedics;  Laterality: Left;  . TOTAL KNEE ARTHROPLASTY Right 10/05/2016   Procedure: RIGHT TOTAL KNEE ARTHROPLASTY;  Surgeon: Gaynelle Arabian, MD;  Location: [facility] ORS;  Service: Orthopedics;  Laterality: Right;  Marland Kitchen VESICOVAGINAL FISTULA CLOSURE W/ TAH  1987    reports that she quit smoking about 27 years ago. Her smoking use included Cigarettes. She has a 20.00 pack-year smoking history. She has never used smokeless tobacco. She reports that she does not drink alcohol or use drugs. Social History   Social History  . Marital status: Married    Spouse name: N/A  . Number of children: 3  . Years of education: N/A   Occupational History  . Nurse   .  [employer]   Social History Main Topics  . Smoking status: Former Smoker    Packs/day: 1.00  Years: 20.00    Types: Cigarettes    Quit date: 11/30/1988  . Smokeless tobacco: Never Used  . Alcohol use No  . Drug use: No  . Sexual activity: Not on file   Other Topics Concern  . Not on file   Social History Narrative  . No narrative on file    Functional Status Survey:    Family History  Problem Relation Age of Onset  . Stroke Mother   . Heart disease Mother     CABG at age 67  . Heart attack Father     Died of MI at age 15  . Emphysema Father   . Dementia Brother     . Osteoarthritis    . Celiac disease      Health Maintenance  Topic Date Due  . Hepatitis C Screening  05[birth year]04[birth year]48  . TETANUS/TDAP  06/27/1967  . MAMMOGRAM  06/26/1998  . COLONOSCOPY  06/26/1998  . PNA vac Low Risk Adult (2 of 2 - PPSV23) 09/20/2015  . INFLUENZA VACCINE  Completed  . DEXA SCAN  Completed  . ZOSTAVAX  Completed    Allergies  Allergen Reactions  . Codeine     Neurological problems - hallucinations, "spacey"    Current Outpatient Prescriptions on File Prior to Visit  Medication Sig Dispense Refill  . acetaminophen (TYLENOL) 325 MG tablet Take 2 tablets (650 mg total) by mouth every 6 (six) hours as needed for mild pain (or Fever >/= 101). 40 tablet 0  . albuterol (PROVENTIL HFA;VENTOLIN HFA) 108 (90 BASE) MCG/ACT inhaler Inhale 2 puffs into the lungs every 6 (six) hours as needed for wheezing.     . bisacodyl (DULCOLAX) 10 MG suppository Place 1 suppository (10 mg total) rectally daily as needed for moderate constipation. 12 suppository 0  . docusate sodium (COLACE) 100 MG capsule Take 1 capsule (100 mg total) by mouth 2 (two) times daily. 10 capsule 0  . furosemide (LASIX) 40 MG tablet TAKE 1 TABLET TWICE DAILY 180 tablet 3  . methocarbamol (ROBAXIN) 500 MG tablet Take 1 tablet (500 mg total) by mouth every 6 (six) hours as needed for muscle spasms. 80 tablet 0  . ondansetron (ZOFRAN) 4 MG tablet Take 1 tablet (4 mg total) by mouth every 6 (six) hours as needed for nausea. 20 tablet 0  . oxyCODONE (OXY IR/ROXICODONE) 5 MG immediate release tablet Take 1-2 tablets (5-10 mg total) by mouth every 3 (three) hours as needed for moderate pain or severe pain. 80 tablet 0  . pantoprazole (PROTONIX) 40 MG tablet Take 40 mg by mouth 2 (two) times daily.    . polyethylene glycol (MIRALAX / GLYCOLAX) packet Take 17 g by mouth daily as needed for mild constipation. 14 each 0  . predniSONE (DELTASONE) 5 MG tablet Take 5 mg by mouth daily with breakfast.     . rivaroxaban  (XARELTO) 10 MG TABS tablet Take 1 tablet (10 mg total) by mouth daily with breakfast. Take Xarelto for two and a half more weeks, then discontinue Xarelto. Once the patient has completed the Xarelto, they may resume the 81 mg Aspirin. 19 tablet 0  . traMADol (ULTRAM) 50 MG tablet Take 1-2 tablets (50-100 mg total) by mouth every 6 (six) hours as needed for moderate pain. 80 tablet 1  . zolpidem (AMBIEN) 10 MG tablet Take 10 mg by mouth at bedtime as needed for sleep.     No current facility-administered medications on file prior to visit.  Review of Systems  There were no vitals filed for this visit. There is no height or weight on file to calculate BMI. Physical Exam  Labs reviewed: Basic Metabolic Panel:  Recent Labs  09/25/16 1346 10/06/16 0421 10/07/16 0415  NA 139 140 138  K 3.7 4.1 3.7  CL 101 106 102  CO2 _0 GLUCOSE 89 167* 127*  BUN _1 CREATININE 0.92 0.75 0.71  CALCIUM 8.9 8.7* 8.3*   Liver Function Tests:  Recent Labs  09/25/16 1346  AST 34  ALT 27  ALKPHOS 75  BILITOT 0.6  PROT 7.5  ALBUMIN 4.4   No results for input(s): LIPASE, AMYLASE in the last 8760 hours. No results for input(s): AMMONIA in the last 8760 hours. CBC:  Recent Labs  09/25/16 1346 10/06/16 0421 10/07/16 0415  WBC 8.5 14.3* 17.4*  HGB 14.6 13.0 12.6  HCT 42.3 38.6 37.2  MCV 95.1 95.5 96.1  PLT 275 241 255   Cardiac Enzymes: No results for input(s): CKTOTAL, CKMB, CKMBINDEX, TROPONINI in the last 8760 hours. BNP: Invalid input(s): POCBNP No results found for: HGBA1C No results found for: TSH No results found for: VITAMINB12 No results found for: FOLATE No results found for: IRON, TIBC, FERRITIN  Imaging and Procedures obtained prior to SNF admission: No results found.  Assessment/Plan There are no diagnoses linked to this encounter.   Family/ staff Communication:   Labs/tests ordered:

## 2016-10-11 ENCOUNTER — Emergency Department (HOSPITAL_COMMUNITY)
Admission: EM | Admit: 2016-10-11 | Discharge: 2016-10-11 | Disposition: A | Discharge status: Home / Self Care | Payer: Commercial Managed Care - HMO | Attending: Emergency Medicine | Admitting: Emergency Medicine

## 2016-10-11 ENCOUNTER — Emergency Department (HOSPITAL_COMMUNITY): Payer: Commercial Managed Care - HMO

## 2016-10-11 ENCOUNTER — Encounter (HOSPITAL_COMMUNITY): Payer: Self-pay | Admitting: Emergency Medicine

## 2016-10-11 DIAGNOSIS — L03115 Cellulitis of right lower limb: Secondary | ICD-10-CM | POA: Diagnosis not present

## 2016-10-11 DIAGNOSIS — I5032 Chronic diastolic (congestive) heart failure: Secondary | ICD-10-CM | POA: Diagnosis not present

## 2016-10-11 DIAGNOSIS — Z96653 Presence of artificial knee joint, bilateral: Secondary | ICD-10-CM | POA: Insufficient documentation

## 2016-10-11 DIAGNOSIS — J8410 Pulmonary fibrosis, unspecified: Secondary | ICD-10-CM | POA: Diagnosis not present

## 2016-10-11 DIAGNOSIS — Z87891 Personal history of nicotine dependence: Secondary | ICD-10-CM | POA: Diagnosis not present

## 2016-10-11 DIAGNOSIS — Z7982 Long term (current) use of aspirin: Secondary | ICD-10-CM | POA: Diagnosis not present

## 2016-10-11 DIAGNOSIS — M25561 Pain in right knee: Secondary | ICD-10-CM | POA: Diagnosis not present

## 2016-10-11 DIAGNOSIS — I110 Hypertensive heart disease with heart failure: Secondary | ICD-10-CM | POA: Diagnosis not present

## 2016-10-11 DIAGNOSIS — Z7901 Long term (current) use of anticoagulants: Secondary | ICD-10-CM | POA: Insufficient documentation

## 2016-10-11 DIAGNOSIS — Z96651 Presence of right artificial knee joint: Secondary | ICD-10-CM | POA: Diagnosis not present

## 2016-10-11 LAB — BASIC METABOLIC PANEL
Anion gap: 10 (ref 5–15)
BUN: 14 mg/dL (ref 6–20)
CALCIUM: 8.7 mg/dL — AB (ref 8.9–10.3)
CO2: 32 mmol/L (ref 22–32)
CREATININE: 0.76 mg/dL (ref 0.44–1.00)
Chloride: 98 mmol/L — ABNORMAL LOW (ref 101–111)
Glucose, Bld: 129 mg/dL — ABNORMAL HIGH (ref 65–99)
Potassium: 3.3 mmol/L — ABNORMAL LOW (ref 3.5–5.1)
SODIUM: 140 mmol/L (ref 135–145)

## 2016-10-11 LAB — CBC WITH DIFFERENTIAL/PLATELET
BASOS ABS: 0 10*3/uL (ref 0.0–0.1)
BASOS PCT: 0 %
EOS PCT: 2 %
Eosinophils Absolute: 0.3 10*3/uL (ref 0.0–0.7)
HCT: 36.1 % (ref 36.0–46.0)
Hemoglobin: 12.5 g/dL (ref 12.0–15.0)
Lymphocytes Relative: 21 %
Lymphs Abs: 2.7 10*3/uL (ref 0.7–4.0)
MCH: 32.6 pg (ref 26.0–34.0)
MCHC: 34.6 g/dL (ref 30.0–36.0)
MCV: 94 fL (ref 78.0–100.0)
MONO ABS: 1 10*3/uL (ref 0.1–1.0)
Monocytes Relative: 8 %
NEUTROS ABS: 8.8 10*3/uL — AB (ref 1.7–7.7)
Neutrophils Relative %: 69 %
PLATELETS: 349 10*3/uL (ref 150–400)
RBC: 3.84 MIL/uL — ABNORMAL LOW (ref 3.87–5.11)
RDW: 12.6 % (ref 11.5–15.5)
WBC: 12.8 10*3/uL — AB (ref 4.0–10.5)

## 2016-10-11 LAB — SEDIMENTATION RATE: Sed Rate: 42 mm/hr — ABNORMAL HIGH (ref 0–22)

## 2016-10-11 MED ORDER — DEXTROSE 5 % IV SOLN
1.0000 g | Freq: Once | INTRAVENOUS | Status: AC
  Administered 2016-10-11: 1 g via INTRAVENOUS
  Filled 2016-10-11: qty 10

## 2016-10-11 MED ORDER — STERILE WATER FOR INJECTION IJ SOLN
INTRAMUSCULAR | Status: AC
  Filled 2016-10-11: qty 10

## 2016-10-11 MED ORDER — MORPHINE SULFATE (PF) 4 MG/ML IV SOLN
4.0000 mg | Freq: Once | INTRAVENOUS | Status: AC
  Administered 2016-10-11: 4 mg via INTRAVENOUS
  Filled 2016-10-11: qty 1

## 2016-10-11 MED ORDER — CEPHALEXIN 500 MG PO CAPS: 500.0000 mg | ORAL_CAPSULE | Freq: Four times a day (QID) | ORAL | 0 refills | Status: DC

## 2016-10-11 MED ORDER — CEFTRIAXONE SODIUM 1 G IJ SOLR
1.0000 g | Freq: Once | INTRAMUSCULAR | Status: DC
  Filled 2016-10-11: qty 10

## 2016-10-11 NOTE — ED Notes (Signed)
Bed: NT55 Expected date:  Expected time:  Means of arrival:  Comments: [place]

## 2016-10-11 NOTE — ED Notes (Signed)
Rose PA notified of pt unchanged response to pain post medication administration.

## 2016-10-11 NOTE — Discharge Instructions (Signed)
Start taking Keflex 4 times daily for infection. Continue taking your pain medicine as prescribed. Call Dr. Anne Fu office tomorrow to be seen in clinic on Tuesday.  Return to the ED for sudden worsening pain, fever, inability to walk, or any new or concerning symptoms.

## 2016-10-11 NOTE — ED Provider Notes (Signed)
[facility] DEPT Provider Note   CSN: [id number] Arrival date & time: 10/11/16  1250     History   Chief Complaint Chief Complaint  Patient presents with  . Knee Pain    HPI [name] is a 70 y.o. female.  HPI [name] is a 70 y.o. female with PMH significant for diastolic CHF, GERD, OA, pulmonary fibrosis, RA, and scleroderma who presents with 1 day history of gradual onset, constant, worsening right knee pain, swelling, warmth, and erythema.  She had a TKR 10/05/16 by Dr. Wynelle Link.  She was at rehab this morning and evaluated by her GP who told her to come to the ED for immediate evaluation.  She is ambulatory, but with exquisite pain.  She denies fever, n/v, calf pain, or calf swelling.  Past Medical History:  Diagnosis Date  . Diastolic congestive heart failure ([place])    02/05/15  states no fluid in extremities, breathing same as has been  . GERD (gastroesophageal reflux disease)   . History of blood transfusion   . Hx of cholecystectomy   . Interstitial lung disease ([place])   . Osteoarthritis   . Pulmonary fibrosis ([place])   . Raynaud disease   . Rheumatoid arthritis(714.0)   . Scleroderma ([place])   . Seasonal allergies   . Shortness of breath dyspnea    states normal for her  . Systemic sclerosis [facility])     Patient Active Problem List   Diagnosis Date Noted  . Palpitations 02/11/2016  . Foot infection 04/30/2015  . OA (osteoarthritis) of knee 02/11/2015  . Preop cardiovascular exam 01/25/2015  . Osteopenia 09/03/2014  . Dyspnea 08/14/2014  . Chronic diastolic congestive heart failure ([place]) 08/14/2014  . Pulmonary hypertension ([place]) 04/25/2014  . Chest pain 03/13/2014  . Systemic sclerosis ([place])   . Osteoarthritis   . Seasonal allergies   . Chronic rheumatic arthritis ([place])   . Raynaud disease   . Hypertension   . Pulmonary fibrosis ([place])   . Arthritis of knee, degenerative 09/30/2011  . Lung disease with systemic sclerosis ([place]) 09/30/2011     Past Surgical History:  Procedure Laterality Date  . ABDOMINAL HYSTERECTOMY    . BLADDER SUSPENSION    . CARPAL TUNNEL RELEASE    . LAPAROSCOPIC CHOLECYSTECTOMY    . LEFT HEART CATHETERIZATION WITH CORONARY/GRAFT ANGIOGRAM  03/15/2014   Procedure: LEFT HEART CATHETERIZATION WITH Beatrix Fetters;  Surgeon: Burnell Blanks, MD;  Location: [facility] CATH LAB;  Service: Cardiovascular;;  . TONSILLECTOMY    . TOTAL KNEE ARTHROPLASTY Left 02/11/2015   Procedure: LEFT TOTAL KNEE ARTHROPLASTY, RIGHT KNEE CORTISONE INJECTION;  Surgeon: Gaynelle Arabian, MD;  Location[facility]S;  Service: Orthopedics;  Laterality: Left;  . TOTAL KNEE ARTHROPLASTY Right 10/05/2016   Procedure: RIGHT TOTAL KNEE ARTHROPLASTY;  Surgeon: Gaynelle Arabian, MD;  Location: [facility]S;  Service: Orthopedics;  Laterality: Right;  Marland Kitchen VESICOVAGINAL FISTULA CLOSURE W/ TAH  1987    OB History    No data available       Home Medications    Prior to Admission medications   Medication Sig Start Date End Date Taking? Authorizing Provider  acetaminophen (TYLENOL) 325 MG tablet Take 2 tablets (650 mg total) by mouth every 6 (six) hours as needed for mild pain (or Fever >/= 101). 02/13/15  Yes [name], PA-C  Adalimumab (HUMIRA) 20 MG/0.4ML PSKT Inject 20 mg into the skin every 14 (fourteen) days.   Yes Historical Provider, MD  bisacodyl (DULCOLAX) 10 MG suppository  Place 1 suppository (10 mg total) rectally daily as needed for moderate constipation. 10/07/16  Yes [name], PA-C  docusate sodium (COLACE) 100 MG capsule Take 1 capsule (100 mg total) by mouth 2 (two) times daily. 10/07/16  Yes [name], PA-C  furosemide (LASIX) 40 MG tablet TAKE 1 TABLET TWICE DAILY 11/07/15  Yes Lelon Perla, MD  methocarbamol (ROBAXIN) 500 MG tablet Take 1 tablet (500 mg total) by mouth every 6 (six) hours as needed for muscle spasms. 10/07/16  Yes [name], PA-C  oxyCODONE (OXY IR/ROXICODONE) 5 MG immediate release tablet Take 1-2  tablets (5-10 mg total) by mouth every 3 (three) hours as needed for moderate pain or severe pain. 10/07/16  Yes [name], PA-C  pantoprazole (PROTONIX) 40 MG tablet Take 40 mg by mouth 2 (two) times daily.   Yes Historical Provider, MD  polyethylene glycol (MIRALAX / GLYCOLAX) packet Take 17 g by mouth daily as needed for mild constipation. 10/07/16  Yes [name], PA-C  predniSONE (DELTASONE) 5 MG tablet Take 5 mg by mouth daily with breakfast.    Yes Historical Provider, MD  rivaroxaban (XARELTO) 10 MG TABS tablet Take 1 tablet (10 mg total) by mouth daily with breakfast. Take Xarelto for two and a half more weeks, then discontinue Xarelto. Once the patient has completed the Xarelto, they may resume the 81 mg Aspirin. 10/07/16  Yes [name], PA-C  zolpidem (AMBIEN) 10 MG tablet Take 10 mg by mouth at bedtime as needed for sleep.   Yes Historical Provider, MD  albuterol (PROVENTIL HFA;VENTOLIN HFA) 108 (90 BASE) MCG/ACT inhaler Inhale 2 puffs into the lungs every 6 (six) hours as needed for wheezing.     Historical Provider, MD  aspirin 81 MG chewable tablet Chew 81 mg by mouth daily. Start on 10/26/16 after completion of Xarelto    Historical Provider, MD  cephALEXin (KEFLEX) 500 MG capsule Take 1 capsule (500 mg total) by mouth 4 (four) times daily. 10/11/16   Iva Posten, PA-C  ondansetron (ZOFRAN) 4 MG tablet Take 1 tablet (4 mg total) by mouth every 6 (six) hours as needed for nausea. 10/07/16   [name], PA-C  traMADol (ULTRAM) 50 MG tablet Take 1-2 tablets (50-100 mg total) by mouth every 6 (six) hours as needed for moderate pain. 10/07/16   [name], PA-C    Family History Family History  Problem Relation Age of Onset  . Stroke Mother   . Heart disease Mother     CABG at age 40  . Heart attack Father     Died of MI at age 37  . Emphysema Father   . Dementia Brother   . Osteoarthritis    . Celiac disease      Social History Social History  Substance Use Topics  .  Smoking status: Former Smoker    Packs/day: 1.00    Years: 20.00    Types: Cigarettes    Quit date: 11/30/1988  . Smokeless tobacco: Never Used  . Alcohol use No     Allergies   Codeine   Review of Systems Review of Systems All other systems negative unless otherwise stated in HPI   Physical Exam Updated Vital Signs BP (!) 121/52 (BP Location: Right Arm)   Pulse 77   Temp 98.3 F (36.8 C) (Oral)   Resp 16   SpO2 100%   Physical Exam  Constitutional: She is oriented to person, place, and time. She appears well-developed and well-nourished.  Non-toxic  appearance. She does not have a sickly appearance. She does not appear ill.  HENT:  Head: Normocephalic and atraumatic.  Mouth/Throat: Oropharynx is clear and moist.  Eyes: Conjunctivae are normal.  Neck: Normal range of motion. Neck supple.  Cardiovascular: Normal rate and regular rhythm.   Pulses:      Dorsalis pedis pulses are 2+ on the right side, and 2+ on the left side.  Pulmonary/Chest: Effort normal and breath sounds normal. No accessory muscle usage or stridor. No respiratory distress. She has no wheezes. She has no rhonchi. She has no rales.  Abdominal: Soft. Bowel sounds are normal. She exhibits no distension. There is no tenderness.  Musculoskeletal: Normal range of motion. She exhibits edema and tenderness.  Right knee with swelling, warmth, and erythema.  Tenderness throughout.  Pain with ROM. No calf tenderness or swelling or palpable cords.   Lymphadenopathy:    She has no cervical adenopathy.  Neurological: She is alert and oriented to person, place, and time.  Speech clear without dysarthria.  Skin: Skin is warm and dry.  Right knee with surgical scar without purulence, steri strips in place.   Psychiatric: She has a normal mood and affect. Her behavior is normal.     ED Treatments / Results  Labs (all labs ordered are listed, but only abnormal results are displayed) Labs Reviewed  CBC WITH  DIFFERENTIAL/PLATELET - Abnormal; Notable for the following:       Result Value   WBC 12.8 (*)    RBC 3.84 (*)    Neutro Abs 8.8 (*)    All other components within normal limits  SEDIMENTATION RATE - Abnormal; Notable for the following:    Sed Rate 42 (*)    All other components within normal limits  BASIC METABOLIC PANEL - Abnormal; Notable for the following:    Potassium 3.3 (*)    Chloride 98 (*)    Glucose, Bld 129 (*)    Calcium 8.7 (*)    All other components within normal limits  C-REACTIVE PROTEIN    EKG  EKG Interpretation None       Radiology Dg Knee Complete 4 Views Right  Result Date: 10/11/2016 CLINICAL DATA:  Right knee pain and erythema at the incision for 1-2 days. EXAM: RIGHT KNEE - COMPLETE 4+ VIEW COMPARISON:  None. FINDINGS: The right knee demonstrates a total knee arthroplasty without evidence of hardware failure complication. There is a small joint effusion. There is no fracture or dislocation. The alignment is anatomic. IMPRESSION: No acute osseous injury of the right knee. Electronically Signed   By: Kathreen Devoid   On: 10/11/2016 15:20    Procedures Procedures (including critical care time)  Medications Ordered in ED Medications  cefTRIAXone (ROCEPHIN) injection 1 g (1 g Intramuscular Not Given 10/11/16 1630)  sterile water (preservative free) injection (10 mLs  Not Given 10/11/16 1630)  morphine 4 MG/ML injection 4 mg (4 mg Intravenous Given 10/11/16 1536)  morphine 4 MG/ML injection 4 mg (4 mg Intravenous Given 10/11/16 1630)  cefTRIAXone (ROCEPHIN) 1 g in dextrose 5 % 50 mL IVPB (0 g Intravenous Stopped 10/11/16 1709)     Initial Impression / Assessment and Plan / ED Course  I have reviewed the triage vital signs and the nursing notes.  Pertinent labs & imaging results that were available during my care of the patient were reviewed by me and considered in my medical decision making (see chart for details).  Clinical Course    Patient  presents s/p right knee replacement (Dr. Wynelle Link) 10/05/16 who presents with right knee warmth, erythema, and pain.  Vitals stable.  Concern for cellulitis vs septic joint.  Will obtain labs, plain films, and consult orthopedics.   Mild leukocytosis, 12.8.  Plain films unremarkable.  Spoke with Dr. Alvan Dame, who recommend PO keflex QID and follow up in the office on Tuesday.  Given 1g IV Rocephin while in ED.  ESR 42.  CRP pending.  Return precautions discussed.  Stable for discharge.   Case has been discussed with and seen by Dr. Roderic Palau who agrees with the above plan for discharge.    Final Clinical Impressions(s) / ED Diagnoses   Final diagnoses:  Cellulitis of right lower extremity    New Prescriptions New Prescriptions   CEPHALEXIN (KEFLEX) 500 MG CAPSULE    Take 1 capsule (500 mg total) by mouth 4 (four) times daily.     Gloriann Loan, PA-C 10/11/16 1717    Milton Ferguson, MD 10/11/16 [phone]

## 2016-10-11 NOTE — ED Notes (Signed)
Awaiting p to rtn from diagnostic o complte vials assessment. [employer]

## 2016-10-11 NOTE — ED Notes (Signed)
Pt returned to room from [place], husband at bedside

## 2016-10-11 NOTE — ED Notes (Signed)
Transported to xray

## 2016-10-11 NOTE — ED Triage Notes (Signed)
Patient sent her by doctor for evaluation. Total knee arthroplasty on 11/6. New redness, swelling and increased pain.

## 2016-10-11 NOTE — ED Notes (Signed)
Hand off report given to [facility]

## 2016-10-11 NOTE — ED Notes (Signed)
Rose PA adjusting rocephin order. See MAR.

## 2016-10-12 LAB — C-REACTIVE PROTEIN: CRP: 6.3 mg/dL — ABNORMAL HIGH (ref <1.0)

## 2016-10-21 DIAGNOSIS — M349 Systemic sclerosis, unspecified: Secondary | ICD-10-CM | POA: Diagnosis not present

## 2016-10-21 DIAGNOSIS — I5030 Unspecified diastolic (congestive) heart failure: Secondary | ICD-10-CM | POA: Diagnosis not present

## 2016-10-21 DIAGNOSIS — Z96651 Presence of right artificial knee joint: Secondary | ICD-10-CM | POA: Diagnosis not present

## 2016-10-21 DIAGNOSIS — Z471 Aftercare following joint replacement surgery: Secondary | ICD-10-CM | POA: Diagnosis not present

## 2016-10-21 DIAGNOSIS — Z87891 Personal history of nicotine dependence: Secondary | ICD-10-CM | POA: Diagnosis not present

## 2016-10-21 DIAGNOSIS — J8410 Pulmonary fibrosis, unspecified: Secondary | ICD-10-CM | POA: Diagnosis not present

## 2016-10-21 DIAGNOSIS — K219 Gastro-esophageal reflux disease without esophagitis: Secondary | ICD-10-CM | POA: Diagnosis not present

## 2016-10-21 DIAGNOSIS — M069 Rheumatoid arthritis, unspecified: Secondary | ICD-10-CM | POA: Diagnosis not present

## 2016-10-21 DIAGNOSIS — I7300 Raynaud's syndrome without gangrene: Secondary | ICD-10-CM | POA: Diagnosis not present

## 2016-10-23 DIAGNOSIS — K219 Gastro-esophageal reflux disease without esophagitis: Secondary | ICD-10-CM | POA: Diagnosis not present

## 2016-10-23 DIAGNOSIS — Z471 Aftercare following joint replacement surgery: Secondary | ICD-10-CM | POA: Diagnosis not present

## 2016-10-23 DIAGNOSIS — Z87891 Personal history of nicotine dependence: Secondary | ICD-10-CM | POA: Diagnosis not present

## 2016-10-23 DIAGNOSIS — M349 Systemic sclerosis, unspecified: Secondary | ICD-10-CM | POA: Diagnosis not present

## 2016-10-23 DIAGNOSIS — M069 Rheumatoid arthritis, unspecified: Secondary | ICD-10-CM | POA: Diagnosis not present

## 2016-10-23 DIAGNOSIS — I5030 Unspecified diastolic (congestive) heart failure: Secondary | ICD-10-CM | POA: Diagnosis not present

## 2016-10-23 DIAGNOSIS — Z96651 Presence of right artificial knee joint: Secondary | ICD-10-CM | POA: Diagnosis not present

## 2016-10-23 DIAGNOSIS — I7300 Raynaud's syndrome without gangrene: Secondary | ICD-10-CM | POA: Diagnosis not present

## 2016-10-23 DIAGNOSIS — J8410 Pulmonary fibrosis, unspecified: Secondary | ICD-10-CM | POA: Diagnosis not present

## 2016-10-26 DIAGNOSIS — Z96651 Presence of right artificial knee joint: Secondary | ICD-10-CM | POA: Diagnosis not present

## 2016-10-26 DIAGNOSIS — M349 Systemic sclerosis, unspecified: Secondary | ICD-10-CM | POA: Diagnosis not present

## 2016-10-26 DIAGNOSIS — I7300 Raynaud's syndrome without gangrene: Secondary | ICD-10-CM | POA: Diagnosis not present

## 2016-10-26 DIAGNOSIS — Z471 Aftercare following joint replacement surgery: Secondary | ICD-10-CM | POA: Diagnosis not present

## 2016-10-26 DIAGNOSIS — K219 Gastro-esophageal reflux disease without esophagitis: Secondary | ICD-10-CM | POA: Diagnosis not present

## 2016-10-26 DIAGNOSIS — Z87891 Personal history of nicotine dependence: Secondary | ICD-10-CM | POA: Diagnosis not present

## 2016-10-26 DIAGNOSIS — M069 Rheumatoid arthritis, unspecified: Secondary | ICD-10-CM | POA: Diagnosis not present

## 2016-10-26 DIAGNOSIS — J8410 Pulmonary fibrosis, unspecified: Secondary | ICD-10-CM | POA: Diagnosis not present

## 2016-10-26 DIAGNOSIS — I5030 Unspecified diastolic (congestive) heart failure: Secondary | ICD-10-CM | POA: Diagnosis not present

## 2016-10-28 DIAGNOSIS — K219 Gastro-esophageal reflux disease without esophagitis: Secondary | ICD-10-CM | POA: Diagnosis not present

## 2016-10-28 DIAGNOSIS — Z96651 Presence of right artificial knee joint: Secondary | ICD-10-CM | POA: Diagnosis not present

## 2016-10-28 DIAGNOSIS — Z87891 Personal history of nicotine dependence: Secondary | ICD-10-CM | POA: Diagnosis not present

## 2016-10-28 DIAGNOSIS — J8410 Pulmonary fibrosis, unspecified: Secondary | ICD-10-CM | POA: Diagnosis not present

## 2016-10-28 DIAGNOSIS — M349 Systemic sclerosis, unspecified: Secondary | ICD-10-CM | POA: Diagnosis not present

## 2016-10-28 DIAGNOSIS — M069 Rheumatoid arthritis, unspecified: Secondary | ICD-10-CM | POA: Diagnosis not present

## 2016-10-28 DIAGNOSIS — I5030 Unspecified diastolic (congestive) heart failure: Secondary | ICD-10-CM | POA: Diagnosis not present

## 2016-10-28 DIAGNOSIS — I7300 Raynaud's syndrome without gangrene: Secondary | ICD-10-CM | POA: Diagnosis not present

## 2016-10-28 DIAGNOSIS — Z471 Aftercare following joint replacement surgery: Secondary | ICD-10-CM | POA: Diagnosis not present

## 2016-10-30 DIAGNOSIS — Z87891 Personal history of nicotine dependence: Secondary | ICD-10-CM | POA: Diagnosis not present

## 2016-10-30 DIAGNOSIS — M349 Systemic sclerosis, unspecified: Secondary | ICD-10-CM | POA: Diagnosis not present

## 2016-10-30 DIAGNOSIS — I5030 Unspecified diastolic (congestive) heart failure: Secondary | ICD-10-CM | POA: Diagnosis not present

## 2016-10-30 DIAGNOSIS — I7300 Raynaud's syndrome without gangrene: Secondary | ICD-10-CM | POA: Diagnosis not present

## 2016-10-30 DIAGNOSIS — Z96651 Presence of right artificial knee joint: Secondary | ICD-10-CM | POA: Diagnosis not present

## 2016-10-30 DIAGNOSIS — M069 Rheumatoid arthritis, unspecified: Secondary | ICD-10-CM | POA: Diagnosis not present

## 2016-10-30 DIAGNOSIS — J8410 Pulmonary fibrosis, unspecified: Secondary | ICD-10-CM | POA: Diagnosis not present

## 2016-10-30 DIAGNOSIS — Z471 Aftercare following joint replacement surgery: Secondary | ICD-10-CM | POA: Diagnosis not present

## 2016-10-30 DIAGNOSIS — K219 Gastro-esophageal reflux disease without esophagitis: Secondary | ICD-10-CM | POA: Diagnosis not present

## 2016-11-02 DIAGNOSIS — R197 Diarrhea, unspecified: Secondary | ICD-10-CM | POA: Diagnosis not present

## 2016-11-03 DIAGNOSIS — I7300 Raynaud's syndrome without gangrene: Secondary | ICD-10-CM | POA: Diagnosis not present

## 2016-11-03 DIAGNOSIS — M069 Rheumatoid arthritis, unspecified: Secondary | ICD-10-CM | POA: Diagnosis not present

## 2016-11-03 DIAGNOSIS — M349 Systemic sclerosis, unspecified: Secondary | ICD-10-CM | POA: Diagnosis not present

## 2016-11-03 DIAGNOSIS — Z96651 Presence of right artificial knee joint: Secondary | ICD-10-CM | POA: Diagnosis not present

## 2016-11-03 DIAGNOSIS — Z87891 Personal history of nicotine dependence: Secondary | ICD-10-CM | POA: Diagnosis not present

## 2016-11-03 DIAGNOSIS — R197 Diarrhea, unspecified: Secondary | ICD-10-CM | POA: Diagnosis not present

## 2016-11-03 DIAGNOSIS — Z471 Aftercare following joint replacement surgery: Secondary | ICD-10-CM | POA: Diagnosis not present

## 2016-11-03 DIAGNOSIS — I5030 Unspecified diastolic (congestive) heart failure: Secondary | ICD-10-CM | POA: Diagnosis not present

## 2016-11-03 DIAGNOSIS — K219 Gastro-esophageal reflux disease without esophagitis: Secondary | ICD-10-CM | POA: Diagnosis not present

## 2016-11-03 DIAGNOSIS — J8410 Pulmonary fibrosis, unspecified: Secondary | ICD-10-CM | POA: Diagnosis not present

## 2016-11-04 DIAGNOSIS — I5030 Unspecified diastolic (congestive) heart failure: Secondary | ICD-10-CM | POA: Diagnosis not present

## 2016-11-04 DIAGNOSIS — I7300 Raynaud's syndrome without gangrene: Secondary | ICD-10-CM | POA: Diagnosis not present

## 2016-11-04 DIAGNOSIS — K219 Gastro-esophageal reflux disease without esophagitis: Secondary | ICD-10-CM | POA: Diagnosis not present

## 2016-11-04 DIAGNOSIS — Z471 Aftercare following joint replacement surgery: Secondary | ICD-10-CM | POA: Diagnosis not present

## 2016-11-04 DIAGNOSIS — M069 Rheumatoid arthritis, unspecified: Secondary | ICD-10-CM | POA: Diagnosis not present

## 2016-11-04 DIAGNOSIS — Z96651 Presence of right artificial knee joint: Secondary | ICD-10-CM | POA: Diagnosis not present

## 2016-11-04 DIAGNOSIS — J8410 Pulmonary fibrosis, unspecified: Secondary | ICD-10-CM | POA: Diagnosis not present

## 2016-11-04 DIAGNOSIS — M349 Systemic sclerosis, unspecified: Secondary | ICD-10-CM | POA: Diagnosis not present

## 2016-11-04 DIAGNOSIS — Z87891 Personal history of nicotine dependence: Secondary | ICD-10-CM | POA: Diagnosis not present

## 2016-11-10 DIAGNOSIS — M349 Systemic sclerosis, unspecified: Secondary | ICD-10-CM | POA: Diagnosis not present

## 2016-11-10 DIAGNOSIS — I7300 Raynaud's syndrome without gangrene: Secondary | ICD-10-CM | POA: Diagnosis not present

## 2016-11-10 DIAGNOSIS — J8410 Pulmonary fibrosis, unspecified: Secondary | ICD-10-CM | POA: Diagnosis not present

## 2016-11-10 DIAGNOSIS — Z96651 Presence of right artificial knee joint: Secondary | ICD-10-CM | POA: Diagnosis not present

## 2016-11-10 DIAGNOSIS — M069 Rheumatoid arthritis, unspecified: Secondary | ICD-10-CM | POA: Diagnosis not present

## 2016-11-10 DIAGNOSIS — K219 Gastro-esophageal reflux disease without esophagitis: Secondary | ICD-10-CM | POA: Diagnosis not present

## 2016-11-10 DIAGNOSIS — Z87891 Personal history of nicotine dependence: Secondary | ICD-10-CM | POA: Diagnosis not present

## 2016-11-10 DIAGNOSIS — I5030 Unspecified diastolic (congestive) heart failure: Secondary | ICD-10-CM | POA: Diagnosis not present

## 2016-11-10 DIAGNOSIS — Z471 Aftercare following joint replacement surgery: Secondary | ICD-10-CM | POA: Diagnosis not present

## 2016-11-11 DIAGNOSIS — K591 Functional diarrhea: Secondary | ICD-10-CM | POA: Diagnosis not present

## 2016-11-12 DIAGNOSIS — K219 Gastro-esophageal reflux disease without esophagitis: Secondary | ICD-10-CM | POA: Diagnosis not present

## 2016-11-12 DIAGNOSIS — I7300 Raynaud's syndrome without gangrene: Secondary | ICD-10-CM | POA: Diagnosis not present

## 2016-11-12 DIAGNOSIS — M069 Rheumatoid arthritis, unspecified: Secondary | ICD-10-CM | POA: Diagnosis not present

## 2016-11-12 DIAGNOSIS — M349 Systemic sclerosis, unspecified: Secondary | ICD-10-CM | POA: Diagnosis not present

## 2016-11-12 DIAGNOSIS — Z87891 Personal history of nicotine dependence: Secondary | ICD-10-CM | POA: Diagnosis not present

## 2016-11-12 DIAGNOSIS — J8410 Pulmonary fibrosis, unspecified: Secondary | ICD-10-CM | POA: Diagnosis not present

## 2016-11-12 DIAGNOSIS — Z471 Aftercare following joint replacement surgery: Secondary | ICD-10-CM | POA: Diagnosis not present

## 2016-11-12 DIAGNOSIS — I5030 Unspecified diastolic (congestive) heart failure: Secondary | ICD-10-CM | POA: Diagnosis not present

## 2016-11-12 DIAGNOSIS — Z96651 Presence of right artificial knee joint: Secondary | ICD-10-CM | POA: Diagnosis not present

## 2016-11-26 DIAGNOSIS — J019 Acute sinusitis, unspecified: Secondary | ICD-10-CM | POA: Diagnosis not present

## 2016-12-16 ENCOUNTER — Other Ambulatory Visit: Payer: Self-pay | Admitting: Cardiology

## 2016-12-18 NOTE — Telephone Encounter (Signed)
Rx has been sent to the pharmacy electronically.

## 2016-12-31 DIAGNOSIS — M340 Progressive systemic sclerosis: Secondary | ICD-10-CM | POA: Diagnosis not present

## 2016-12-31 DIAGNOSIS — M150 Primary generalized (osteo)arthritis: Secondary | ICD-10-CM | POA: Diagnosis not present

## 2016-12-31 DIAGNOSIS — J849 Interstitial pulmonary disease, unspecified: Secondary | ICD-10-CM | POA: Diagnosis not present

## 2016-12-31 DIAGNOSIS — M5126 Other intervertebral disc displacement, lumbar region: Secondary | ICD-10-CM | POA: Diagnosis not present

## 2016-12-31 DIAGNOSIS — I7300 Raynaud's syndrome without gangrene: Secondary | ICD-10-CM | POA: Diagnosis not present

## 2016-12-31 DIAGNOSIS — M0579 Rheumatoid arthritis with rheumatoid factor of multiple sites without organ or systems involvement: Secondary | ICD-10-CM | POA: Diagnosis not present

## 2017-01-27 DIAGNOSIS — Z01419 Encounter for gynecological examination (general) (routine) without abnormal findings: Secondary | ICD-10-CM | POA: Diagnosis not present

## 2017-01-27 DIAGNOSIS — N952 Postmenopausal atrophic vaginitis: Secondary | ICD-10-CM | POA: Diagnosis not present

## 2017-01-27 DIAGNOSIS — N941 Unspecified dyspareunia: Secondary | ICD-10-CM | POA: Diagnosis not present

## 2017-01-27 DIAGNOSIS — Z124 Encounter for screening for malignant neoplasm of cervix: Secondary | ICD-10-CM | POA: Diagnosis not present

## 2017-01-27 DIAGNOSIS — Z6835 Body mass index (BMI) 35.0-35.9, adult: Secondary | ICD-10-CM | POA: Diagnosis not present

## 2017-03-01 DIAGNOSIS — Z1231 Encounter for screening mammogram for malignant neoplasm of breast: Secondary | ICD-10-CM | POA: Diagnosis not present

## 2017-03-01 DIAGNOSIS — N952 Postmenopausal atrophic vaginitis: Secondary | ICD-10-CM | POA: Diagnosis not present

## 2017-03-01 DIAGNOSIS — M8588 Other specified disorders of bone density and structure, other site: Secondary | ICD-10-CM | POA: Diagnosis not present

## 2017-03-01 DIAGNOSIS — N958 Other specified menopausal and perimenopausal disorders: Secondary | ICD-10-CM | POA: Diagnosis not present

## 2017-03-12 DIAGNOSIS — Z0100 Encounter for examination of eyes and vision without abnormal findings: Secondary | ICD-10-CM | POA: Diagnosis not present

## 2017-04-01 DIAGNOSIS — M340 Progressive systemic sclerosis: Secondary | ICD-10-CM | POA: Diagnosis not present

## 2017-04-01 DIAGNOSIS — M5126 Other intervertebral disc displacement, lumbar region: Secondary | ICD-10-CM | POA: Diagnosis not present

## 2017-04-01 DIAGNOSIS — Z6835 Body mass index (BMI) 35.0-35.9, adult: Secondary | ICD-10-CM | POA: Diagnosis not present

## 2017-04-01 DIAGNOSIS — I7300 Raynaud's syndrome without gangrene: Secondary | ICD-10-CM | POA: Diagnosis not present

## 2017-04-01 DIAGNOSIS — J849 Interstitial pulmonary disease, unspecified: Secondary | ICD-10-CM | POA: Diagnosis not present

## 2017-04-01 DIAGNOSIS — M0579 Rheumatoid arthritis with rheumatoid factor of multiple sites without organ or systems involvement: Secondary | ICD-10-CM | POA: Diagnosis not present

## 2017-04-01 DIAGNOSIS — M150 Primary generalized (osteo)arthritis: Secondary | ICD-10-CM | POA: Diagnosis not present

## 2017-06-24 ENCOUNTER — Encounter: Payer: Self-pay | Admitting: Podiatry

## 2017-06-24 ENCOUNTER — Ambulatory Visit (INDEPENDENT_AMBULATORY_CARE_PROVIDER_SITE_OTHER): Payer: Medicare HMO | Admitting: Podiatry

## 2017-06-24 DIAGNOSIS — L600 Ingrowing nail: Secondary | ICD-10-CM | POA: Diagnosis not present

## 2017-06-24 MED ORDER — NITROGLYCERIN 0.2 MG/HR TD PT24: 0.2000 mg | MEDICATED_PATCH | Freq: Every day | TRANSDERMAL | 12 refills | Status: DC

## 2017-06-24 MED ORDER — DOXYCYCLINE MONOHYDRATE 100 MG PO CAPS: 100.0000 mg | ORAL_CAPSULE | Freq: Two times a day (BID) | ORAL | 1 refills | Status: DC

## 2017-06-24 MED ORDER — NEOMYCIN-POLYMYXIN-HC 1 % OT SOLN: OTIC | 1 refills | Status: DC

## 2017-06-24 NOTE — Patient Instructions (Signed)
Betadine Soak Instructions  Purchase an 8 oz. bottle of BETADINE solution (Povidone)  THE DAY AFTER THE PROCEDURE  Place 1 tablespoon of betadine solution in a quart of warm tap water.  Submerge your foot or feet with outer bandage intact for the initial soak; this will allow the bandage to become moist and wet for easy lift off.  Once you remove your bandage, continue to soak in the solution for 20 minutes.  This soak should be done twice a day.  Next, remove your foot or feet from solution, blot dry the affected area and cover.  You may use a band aid large enough to cover the area or use gauze and tape.  Apply other medications to the area as directed by the doctor such as cortisporin otic solution (ear drops) or neosporin.  IF YOUR SKIN BECOMES IRRITATED WHILE USING THESE INSTRUCTIONS, IT IS OKAY TO SWITCH TO EPSOM SALTS AND WATER OR WHITE VINEGAR AND WATER.   [facility] Instructions-Post Nail Surgery  You have had your ingrown toenail and root treated with a chemical.  This chemical causes a burn that will drain and ooze like a blister.  This can drain for 6-8 weeks or longer.  It is important to keep this area clean, covered, and follow the soaking instructions dispensed at the time of your surgery.  This area will eventually dry and form a scab.  Once the scab forms you no longer need to soak or apply a dressing.  If at any time you experience an increase in pain, redness, swelling, or drainage, you should contact the office as soon as possible.

## 2017-06-25 NOTE — Progress Notes (Signed)
She presents today with a chief complaint of a painful hallux nail tibial border left foot. Also concerned about a painful area to the medial aspect of the second toe nail right foot.  Objective: Vital signs are stable she is alert and oriented 3 pulses are palpable. It is obvious that her capillary fill time somewhat sluggish with Raynaud's syndrome and she does take a blood thinner. There is moderate erythema along the tibial border of the hallux left and a small granuloma to the tibial border of the second digit on the right foot. These are both tender on palpation.  Assessment: Ingrown nail paronychia abscess hallux left second digit right foot.  Plan: Chemical matrixectomy was performed today after local anesthesia was administered no epinephrine was utilized. Also performed a nail avulsion partial temporary tibial border digit right foot. We used Surgicel to help maintain hemostasis. Also prescribed an antibiotic doxycycline as well as nitroglycerin patch to help for vasodilation and healing of the wound. She had delayed healing last time we perform this procedure. She was given both oral and written home-going instructions for care and soaking of her foot as well as use of the medications.

## 2017-07-07 DIAGNOSIS — M340 Progressive systemic sclerosis: Secondary | ICD-10-CM | POA: Diagnosis not present

## 2017-07-07 DIAGNOSIS — I7300 Raynaud's syndrome without gangrene: Secondary | ICD-10-CM | POA: Diagnosis not present

## 2017-07-07 DIAGNOSIS — L03011 Cellulitis of right finger: Secondary | ICD-10-CM | POA: Diagnosis not present

## 2017-07-07 DIAGNOSIS — M0579 Rheumatoid arthritis with rheumatoid factor of multiple sites without organ or systems involvement: Secondary | ICD-10-CM | POA: Diagnosis not present

## 2017-07-07 DIAGNOSIS — Z6836 Body mass index (BMI) 36.0-36.9, adult: Secondary | ICD-10-CM | POA: Diagnosis not present

## 2017-07-07 DIAGNOSIS — E669 Obesity, unspecified: Secondary | ICD-10-CM | POA: Diagnosis not present

## 2017-07-07 DIAGNOSIS — M150 Primary generalized (osteo)arthritis: Secondary | ICD-10-CM | POA: Diagnosis not present

## 2017-07-07 DIAGNOSIS — J849 Interstitial pulmonary disease, unspecified: Secondary | ICD-10-CM | POA: Diagnosis not present

## 2017-07-07 DIAGNOSIS — M5126 Other intervertebral disc displacement, lumbar region: Secondary | ICD-10-CM | POA: Diagnosis not present

## 2017-07-08 ENCOUNTER — Ambulatory Visit (INDEPENDENT_AMBULATORY_CARE_PROVIDER_SITE_OTHER): Payer: Self-pay | Admitting: Podiatry

## 2017-07-08 ENCOUNTER — Encounter: Payer: Self-pay | Admitting: Podiatry

## 2017-07-08 DIAGNOSIS — L600 Ingrowing nail: Secondary | ICD-10-CM

## 2017-07-08 NOTE — Patient Instructions (Signed)

## 2017-07-10 NOTE — Progress Notes (Signed)
She presents today for follow-up nail procedure hallux right. She states that it seems to be doing okay. Continues to soak.  Objective: No erythema edema cellulitis drainage or odor. Appears to be healing nicely SE no signs of infection.  Assessment: 1 healing matrixectomy.  Plan: Continue current therapies every other day follow-up with me should is not going to heal.

## 2017-07-22 ENCOUNTER — Ambulatory Visit: Payer: Medicare HMO | Admitting: Podiatry

## 2017-07-22 DIAGNOSIS — L820 Inflamed seborrheic keratosis: Secondary | ICD-10-CM | POA: Diagnosis not present

## 2017-07-22 DIAGNOSIS — L218 Other seborrheic dermatitis: Secondary | ICD-10-CM | POA: Diagnosis not present

## 2017-07-22 DIAGNOSIS — L821 Other seborrheic keratosis: Secondary | ICD-10-CM | POA: Diagnosis not present

## 2017-07-22 DIAGNOSIS — I788 Other diseases of capillaries: Secondary | ICD-10-CM | POA: Diagnosis not present

## 2017-09-15 DIAGNOSIS — H04123 Dry eye syndrome of bilateral lacrimal glands: Secondary | ICD-10-CM | POA: Diagnosis not present

## 2017-09-15 DIAGNOSIS — M059 Rheumatoid arthritis with rheumatoid factor, unspecified: Secondary | ICD-10-CM | POA: Diagnosis not present

## 2017-09-15 DIAGNOSIS — Z79899 Other long term (current) drug therapy: Secondary | ICD-10-CM | POA: Diagnosis not present

## 2017-09-15 DIAGNOSIS — H25813 Combined forms of age-related cataract, bilateral: Secondary | ICD-10-CM | POA: Diagnosis not present

## 2017-09-15 DIAGNOSIS — H35372 Puckering of macula, left eye: Secondary | ICD-10-CM | POA: Diagnosis not present

## 2017-09-27 DIAGNOSIS — Z23 Encounter for immunization: Secondary | ICD-10-CM | POA: Diagnosis not present

## 2017-10-06 DIAGNOSIS — H43811 Vitreous degeneration, right eye: Secondary | ICD-10-CM | POA: Diagnosis not present

## 2017-10-06 DIAGNOSIS — Z79899 Other long term (current) drug therapy: Secondary | ICD-10-CM | POA: Diagnosis not present

## 2017-10-06 DIAGNOSIS — H2513 Age-related nuclear cataract, bilateral: Secondary | ICD-10-CM | POA: Diagnosis not present

## 2017-12-29 DIAGNOSIS — J849 Interstitial pulmonary disease, unspecified: Secondary | ICD-10-CM | POA: Diagnosis not present

## 2017-12-29 DIAGNOSIS — M150 Primary generalized (osteo)arthritis: Secondary | ICD-10-CM | POA: Diagnosis not present

## 2017-12-29 DIAGNOSIS — E669 Obesity, unspecified: Secondary | ICD-10-CM | POA: Diagnosis not present

## 2017-12-29 DIAGNOSIS — M5126 Other intervertebral disc displacement, lumbar region: Secondary | ICD-10-CM | POA: Diagnosis not present

## 2017-12-29 DIAGNOSIS — M0579 Rheumatoid arthritis with rheumatoid factor of multiple sites without organ or systems involvement: Secondary | ICD-10-CM | POA: Diagnosis not present

## 2017-12-29 DIAGNOSIS — M340 Progressive systemic sclerosis: Secondary | ICD-10-CM | POA: Diagnosis not present

## 2017-12-29 DIAGNOSIS — I7300 Raynaud's syndrome without gangrene: Secondary | ICD-10-CM | POA: Diagnosis not present

## 2017-12-29 DIAGNOSIS — L03011 Cellulitis of right finger: Secondary | ICD-10-CM | POA: Diagnosis not present

## 2017-12-29 DIAGNOSIS — Z6837 Body mass index (BMI) 37.0-37.9, adult: Secondary | ICD-10-CM | POA: Diagnosis not present

## 2018-01-05 ENCOUNTER — Other Ambulatory Visit: Payer: Self-pay | Admitting: Cardiology

## 2018-01-06 NOTE — Telephone Encounter (Signed)
REFILL

## 2018-01-24 ENCOUNTER — Telehealth: Payer: Self-pay | Admitting: Pulmonary Disease

## 2018-01-24 DIAGNOSIS — J8410 Pulmonary fibrosis, unspecified: Secondary | ICD-10-CM

## 2018-01-24 NOTE — Telephone Encounter (Signed)
Yes please order PFT

## 2018-01-24 NOTE — Telephone Encounter (Signed)
PFT ordered nothing further needed.

## 2018-01-24 NOTE — Telephone Encounter (Signed)
BQ please advise, are you still wanting patient to have PFT completed. If so I will place the order. Patient called in for follow up and for PFT however the order has expired.

## 2018-01-25 DIAGNOSIS — Z6837 Body mass index (BMI) 37.0-37.9, adult: Secondary | ICD-10-CM | POA: Diagnosis not present

## 2018-01-25 DIAGNOSIS — L03011 Cellulitis of right finger: Secondary | ICD-10-CM | POA: Diagnosis not present

## 2018-02-03 ENCOUNTER — Telehealth: Payer: Self-pay | Admitting: Pulmonary Disease

## 2018-02-03 MED ORDER — ALBUTEROL SULFATE HFA 108 (90 BASE) MCG/ACT IN AERS: 2.0000 | INHALATION_SPRAY | Freq: Four times a day (QID) | RESPIRATORY_TRACT | 0 refills | Status: DC | PRN

## 2018-02-03 NOTE — Telephone Encounter (Signed)
Patient has an appointment with BQ on 3.29.19. Patient states that she is going out of town for the weekend and is needing the refill just in case. Sent to correct pharmacy, nothing further needed.

## 2018-02-25 ENCOUNTER — Encounter: Payer: Self-pay | Admitting: Pulmonary Disease

## 2018-02-25 ENCOUNTER — Ambulatory Visit (INDEPENDENT_AMBULATORY_CARE_PROVIDER_SITE_OTHER): Payer: Medicare HMO | Admitting: Pulmonary Disease

## 2018-02-25 VITALS — BP 134/66 | HR 87 | Ht 63.0 in | Wt 200.0 lb

## 2018-02-25 DIAGNOSIS — J301 Allergic rhinitis due to pollen: Secondary | ICD-10-CM

## 2018-02-25 DIAGNOSIS — J8410 Pulmonary fibrosis, unspecified: Secondary | ICD-10-CM | POA: Diagnosis not present

## 2018-02-25 DIAGNOSIS — J849 Interstitial pulmonary disease, unspecified: Secondary | ICD-10-CM

## 2018-02-25 DIAGNOSIS — M349 Systemic sclerosis, unspecified: Secondary | ICD-10-CM | POA: Diagnosis not present

## 2018-02-25 DIAGNOSIS — M059 Rheumatoid arthritis with rheumatoid factor, unspecified: Secondary | ICD-10-CM | POA: Diagnosis not present

## 2018-02-25 LAB — PULMONARY FUNCTION TEST
DL/VA % PRED: 122 %
DL/VA: 5.74 ml/min/mmHg/L
DLCO UNC % PRED: 71 %
DLCO UNC: 16.44 ml/min/mmHg
FEF 25-75 Post: 1.2 L/sec
FEF 25-75 Pre: 1.12 L/sec
FEF2575-%Change-Post: 7 %
FEF2575-%Pred-Post: 64 %
FEF2575-%Pred-Pre: 59 %
FEV1-%Change-Post: 1 %
FEV1-%Pred-Post: 63 %
FEV1-%Pred-Pre: 62 %
FEV1-Post: 1.38 L
FEV1-Pre: 1.35 L
FEV1FVC-%Change-Post: 0 %
FEV1FVC-%Pred-Pre: 101 %
FEV6-%Change-Post: 2 %
FEV6-%PRED-POST: 64 %
FEV6-%PRED-PRE: 62 %
FEV6-POST: 1.77 L
FEV6-PRE: 1.74 L
FEV6FVC-%CHANGE-POST: 0 %
FEV6FVC-%PRED-POST: 104 %
FEV6FVC-%PRED-PRE: 103 %
FVC-%Change-Post: 1 %
FVC-%Pred-Post: 61 %
FVC-%Pred-Pre: 60 %
FVC-Post: 1.78 L
FVC-Pre: 1.75 L
POST FEV1/FVC RATIO: 78 %
POST FEV6/FVC RATIO: 100 %
PRE FEV6/FVC RATIO: 99 %
Pre FEV1/FVC ratio: 77 %

## 2018-02-25 MED ORDER — MONTELUKAST SODIUM 10 MG PO TABS: 10.0000 mg | ORAL_TABLET | Freq: Every day | ORAL | 11 refills | Status: DC

## 2018-02-25 MED ORDER — MONTELUKAST SODIUM 10 MG PO TABS: 10.0000 mg | ORAL_TABLET | Freq: Every day | ORAL | 3 refills | Status: DC

## 2018-02-25 NOTE — Patient Instructions (Signed)
Autoimmune associated interstitial lung disease: We will arrange for a high-resolution CT scan of the chest to see if there is worsening disease, I am a bit concerned about this because of the change in your pulmonary function test and the increase in symptoms you have had recently  Poorly controlled allergic rhinitis: Start taking generic Flonase or Nasacort 2 sprays each nostril Take Singulair 1 pill daily (montelukast) Continue taking Allegra as you are doing Use saline rinses regularly, I recommend at least 2 times a day, 30 minutes prior to using the nasal steroid  Rheumatoid arthritis and scleroderma: Continue plaque canal and prednisone as directed by the rheumatology clinic Depending on the changes in your high-resolution CT scan of the chest we may need to consider a different form of immunosuppression  We will see you back in 2-4 weeks or sooner if needed to go over the results of the high-resolution CT

## 2018-02-25 NOTE — Progress Notes (Signed)
PFT completed today.

## 2018-02-25 NOTE — Progress Notes (Signed)
Subjective:    Patient ID: [name], female    DOB: 1946/11/07, 72 y.o.   MRN: [id number]   Synopsis: Former patient of Dr. Lyda Jester who has a history of rheumatoid arthritis and systemic sclerosis. [name] was noted and it least 2009 to have pulmonary fibrosis. [name] was followed initially at [facility] clinics transferred her care to physicians in [place]. [name] has been treated with Humira which controlled her joint symptoms significantly. [name] retired in 2014 inserted. Since some dyspnea afterwards. [name] had a right heart cath in April 2015 which showed the following: Hemodynamic Findings:  Ao: 139/66  LV: 149/10/20  RA: 7  RV: 43/7/9  PA: 43/18 (mean 31)  PCWP: 25  Fick Cardiac Output: 6.9 L/min  Fick Cardiac Index: 3.45 L/min/m2  Central Aortic Saturation: 93%  Pulmonary Artery Saturation: 72%  Dr. Joya Gaskins summarize the workup for her pulmonary fibrosis as follows: Pulm fibrosis presumably d/t Scleroderma No esophageal involvement. + raynaud, sclerodactyly, telangiectasias,  pulm fibrosis.  No evident Pulm HTN at last check in 2010.  R[facility]um: Dr Alanda [facility]ass at [facility]  HPI Chief Complaint  Patient presents with  . Follow-up    review PFT.  [name] c/o excess PND.     [name] has a lot of sinus problems: > [name] says that [name] is coughing up a lot of mucus in the mornings Has been taking Allegra-D which is helpful [name] is not using any sort of nasal steroid No Singulair  RA and systemic sclerosis > no longer taking Humira because [name] was concerned about her immune system being poorly controlled > just taking prednisone and plaquenil > Stop taking Humira at least 2 years ago Has been having more problems with her fingers and digit abnormalities  Increasing shortness of breath over the last year with any activity.  No leg swelling no chest pain.  Past Medical History:  Diagnosis Date  . Diastolic congestive heart failure ([place])    02/05/15  states  no fluid in extremities, breathing same as has been  . GERD (gastroesophageal reflux disease)   . History of blood transfusion   . Hx of cholecystectomy   . Interstitial lung disease ([place])   . Osteoarthritis   . Pulmonary fibrosis ([place])   . Raynaud disease   . Rheumatoid arthritis(714.0)   . Scleroderma ([place])   . Seasonal allergies   . Shortness of breath dyspnea    states normal for her  . Systemic sclerosis ([place])       Review of Systems  Constitutional: Negative for chills, fatigue and fever.  HENT: Positive for postnasal drip and rhinorrhea. Negative for sinus pressure.   Respiratory: Positive for cough and shortness of breath. Negative for wheezing.   Cardiovascular: Negative for chest pain, palpitations and leg swelling.       Objective:   Physical Exam Vitals:   02/25/18 1553  BP: 134/66  Pulse: 87  SpO2: 94%  Weight: 200 lb (90.7 kg)  Height: _0  (1.6 m)  RA  6 minute walk vitals were reviewed today  Gen: well appearing HENT: OP clear, TM's clear, neck supple PULM: Crackles throughout B, normal percussion CV: RRR, no mgr, trace edema GI: BS+, soft, nontender Derm: digital ischemia/scarring Psyche: normal mood and affect   Chest imaging: Last CT chest and [facility] 10/12 at [facility]  DLCO 50% 10/12 CT Chest 07/07/2012: mild progression of pulmonary fibrosis compared to prior films Images from her CT chest from 2015 were personally reviewed.  [name] has right middle lobe and lingular traction bronchiolectasis with surrounding interstitial changes but there is no clear honeycombing. [name] also has peripheral predominant interlobular septal thickening but again no clear honeycombing which is worse in the bases.  PFT PFT 07/07/2012:  TLC 60%  DLCO 52%   PFTs 09/05/13:  FeV1 74% FVC 69% FeV1/FVC 82% TLC 63% DLCO 68%   PFTs 03/08/2014: DLCO 68% PFT 08/2016 FVC 1.92L (68% pred), DLCO 15.97 74% pred PFT 01/2018 FVC 1.78 L 61% predicted, DLCO 16.44 mL 71% predicted  Echo: Echo  07/07/2012  No pulm HTN  6Min walk: 08/2016 6MW 279 96% nadir O2        Assessment & Plan:   Allergic rhinitis due to pollen, unspecified seasonality  Interstitial pulmonary disease ([place]) - Plan: CT Chest High Resolution  Discussion: I am concerned about [name].  [name] is having increasing shortness of breath and cough with mucus production.  Hopefully this is just all due to poorly controlled allergic rhinitis so we will get that treated.  However, her forced vital capacity has dropped compared to the last visit.  I am a bit concerned that with the change in her immunosuppression over the last few years and the increasing shortness of breath that [name] may be developing worsening interstitial lung disease.  Autoimmune associated interstitial lung disease: We will arrange for a high-resolution CT scan of the chest to see if there is worsening disease, I am a bit concerned about this because of the change in your pulmonary function test and the increase in symptoms you have had recently  Poorly controlled allergic rhinitis: Start taking generic Flonase or Nasacort 2 sprays each nostril Take Singulair 1 pill daily (montelukast) Continue taking Allegra as you are doing Use saline rinses regularly, I recommend at least 2 times a day, 30 minutes prior to using the nasal steroid  Rheumatoid arthritis and scleroderma: Continue plaque canal and prednisone as directed by the rheumatology clinic Depending on the changes in your high-resolution CT scan of the chest we may need to consider a different form of immunosuppression  We will see you back in 2-4 weeks or sooner if needed to go over the results of the high-resolution CT  > 20 minutes spent with patient, 30 min visit    Current Outpatient Medications:  .  acetaminophen (TYLENOL) 325 MG tablet, Take 2 tablets (650 mg total) by mouth every 6 (six) hours as needed for mild pain (or Fever >/= 101)., Disp: 40 tablet, Rfl: 0 .  albuterol  (PROVENTIL HFA;VENTOLIN HFA) 108 (90 Base) MCG/ACT inhaler, Inhale 2 puffs into the lungs every 6 (six) hours as needed for wheezing., Disp: 1 Inhaler, Rfl: 0 .  aspirin 81 MG chewable tablet, Chew 81 mg by mouth daily. Start on 10/26/16 after completion of Xarelto, Disp: , Rfl:  .  bisacodyl (DULCOLAX) 10 MG suppository, Place 1 suppository (10 mg total) rectally daily as needed for moderate constipation., Disp: 12 suppository, Rfl: 0 .  doxycycline (MONODOX) 100 MG capsule, Take 1 capsule (100 mg total) by mouth 2 (two) times daily., Disp: 20 capsule, Rfl: 1 .  furosemide (LASIX) 40 MG tablet, Take 1 tablet (40 mg total) by mouth 2 (two) times daily. NEED OV., Disp: 180 tablet, Rfl: 0 .  hydroxychloroquine (PLAQUENIL) 200 MG tablet, Take 400 mg by mouth daily., Disp: , Rfl:  .  methocarbamol (ROBAXIN) 500 MG tablet, Take 1 tablet (500 mg total) by mouth every 6 (six) hours as needed for muscle  spasms., Disp: 80 tablet, Rfl: 0 .  pantoprazole (PROTONIX) 40 MG tablet, Take 40 mg by mouth 2 (two) times daily., Disp: , Rfl:  .  predniSONE (DELTASONE) 5 MG tablet, Take 5 mg by mouth daily with breakfast. , Disp: , Rfl:  .  zolpidem (AMBIEN) 10 MG tablet, Take 10 mg by mouth at bedtime as needed for sleep., Disp: , Rfl:  .  montelukast (SINGULAIR) 10 MG tablet, Take 1 tablet (10 mg total) by mouth at bedtime., Disp: 30 tablet, Rfl: 11 .  montelukast (SINGULAIR) 10 MG tablet, Take 1 tablet (10 mg total) by mouth at bedtime., Disp: 90 tablet, Rfl: 3

## 2018-03-01 DIAGNOSIS — I998 Other disorder of circulatory system: Secondary | ICD-10-CM | POA: Diagnosis not present

## 2018-03-01 DIAGNOSIS — M79641 Pain in right hand: Secondary | ICD-10-CM | POA: Diagnosis not present

## 2018-03-14 ENCOUNTER — Ambulatory Visit (HOSPITAL_COMMUNITY)
Admission: RE | Admit: 2018-03-14 | Discharge: 2018-03-14 | Disposition: A | Discharge status: Home / Self Care | Payer: Medicare HMO | Source: Ambulatory Visit | Attending: Surgery | Admitting: Surgery

## 2018-03-14 ENCOUNTER — Other Ambulatory Visit (HOSPITAL_COMMUNITY): Payer: Self-pay | Admitting: Orthopedic Surgery

## 2018-03-14 DIAGNOSIS — I998 Other disorder of circulatory system: Secondary | ICD-10-CM

## 2018-03-14 DIAGNOSIS — M79641 Pain in right hand: Secondary | ICD-10-CM

## 2018-03-16 ENCOUNTER — Ambulatory Visit (HOSPITAL_COMMUNITY)
Admission: RE | Admit: 2018-03-16 | Discharge: 2018-03-16 | Disposition: A | Discharge status: Home / Self Care | Payer: Medicare HMO | Source: Ambulatory Visit | Attending: Pulmonary Disease | Admitting: Pulmonary Disease

## 2018-03-16 DIAGNOSIS — I700 Atherosclerosis of aorta: Secondary | ICD-10-CM | POA: Diagnosis not present

## 2018-03-16 DIAGNOSIS — I2510 Atherosclerotic heart disease of native coronary artery without angina pectoris: Secondary | ICD-10-CM | POA: Insufficient documentation

## 2018-03-16 DIAGNOSIS — K760 Fatty (change of) liver, not elsewhere classified: Secondary | ICD-10-CM | POA: Insufficient documentation

## 2018-03-16 DIAGNOSIS — J849 Interstitial pulmonary disease, unspecified: Secondary | ICD-10-CM | POA: Diagnosis not present

## 2018-03-16 DIAGNOSIS — K7460 Unspecified cirrhosis of liver: Secondary | ICD-10-CM | POA: Insufficient documentation

## 2018-03-16 DIAGNOSIS — R309 Painful micturition, unspecified: Secondary | ICD-10-CM | POA: Diagnosis not present

## 2018-03-16 DIAGNOSIS — N390 Urinary tract infection, site not specified: Secondary | ICD-10-CM | POA: Diagnosis not present

## 2018-03-16 DIAGNOSIS — J84112 Idiopathic pulmonary fibrosis: Secondary | ICD-10-CM | POA: Diagnosis not present

## 2018-03-16 DIAGNOSIS — Z01419 Encounter for gynecological examination (general) (routine) without abnormal findings: Secondary | ICD-10-CM | POA: Diagnosis not present

## 2018-03-16 DIAGNOSIS — L259 Unspecified contact dermatitis, unspecified cause: Secondary | ICD-10-CM | POA: Diagnosis not present

## 2018-03-16 DIAGNOSIS — Z6836 Body mass index (BMI) 36.0-36.9, adult: Secondary | ICD-10-CM | POA: Diagnosis not present

## 2018-03-23 ENCOUNTER — Ambulatory Visit: Payer: Medicare HMO | Admitting: Acute Care

## 2018-03-23 DIAGNOSIS — N3000 Acute cystitis without hematuria: Secondary | ICD-10-CM | POA: Diagnosis not present

## 2018-04-06 DIAGNOSIS — I7300 Raynaud's syndrome without gangrene: Secondary | ICD-10-CM | POA: Diagnosis not present

## 2018-04-06 DIAGNOSIS — M0579 Rheumatoid arthritis with rheumatoid factor of multiple sites without organ or systems involvement: Secondary | ICD-10-CM | POA: Diagnosis not present

## 2018-04-06 DIAGNOSIS — L03011 Cellulitis of right finger: Secondary | ICD-10-CM | POA: Diagnosis not present

## 2018-04-06 DIAGNOSIS — M340 Progressive systemic sclerosis: Secondary | ICD-10-CM | POA: Diagnosis not present

## 2018-04-06 DIAGNOSIS — M5126 Other intervertebral disc displacement, lumbar region: Secondary | ICD-10-CM | POA: Diagnosis not present

## 2018-04-06 DIAGNOSIS — E669 Obesity, unspecified: Secondary | ICD-10-CM | POA: Diagnosis not present

## 2018-04-06 DIAGNOSIS — M150 Primary generalized (osteo)arthritis: Secondary | ICD-10-CM | POA: Diagnosis not present

## 2018-04-06 DIAGNOSIS — J849 Interstitial pulmonary disease, unspecified: Secondary | ICD-10-CM | POA: Diagnosis not present

## 2018-04-06 DIAGNOSIS — Z6836 Body mass index (BMI) 36.0-36.9, adult: Secondary | ICD-10-CM | POA: Diagnosis not present

## 2018-05-06 DIAGNOSIS — M25552 Pain in left hip: Secondary | ICD-10-CM | POA: Diagnosis not present

## 2018-05-06 DIAGNOSIS — M25562 Pain in left knee: Secondary | ICD-10-CM | POA: Diagnosis not present

## 2018-05-27 ENCOUNTER — Other Ambulatory Visit (HOSPITAL_COMMUNITY): Payer: Self-pay | Admitting: Orthopedic Surgery

## 2018-05-27 DIAGNOSIS — M25552 Pain in left hip: Secondary | ICD-10-CM | POA: Diagnosis not present

## 2018-06-01 ENCOUNTER — Ambulatory Visit (HOSPITAL_COMMUNITY)
Admission: RE | Admit: 2018-06-01 | Discharge: 2018-06-01 | Disposition: A | Discharge status: Home / Self Care | Payer: Medicare HMO | Source: Ambulatory Visit | Attending: Orthopedic Surgery | Admitting: Orthopedic Surgery

## 2018-06-01 DIAGNOSIS — M1612 Unilateral primary osteoarthritis, left hip: Secondary | ICD-10-CM | POA: Diagnosis not present

## 2018-06-01 DIAGNOSIS — M25552 Pain in left hip: Secondary | ICD-10-CM | POA: Insufficient documentation

## 2018-06-10 DIAGNOSIS — M25562 Pain in left knee: Secondary | ICD-10-CM | POA: Diagnosis not present

## 2018-06-10 DIAGNOSIS — M25552 Pain in left hip: Secondary | ICD-10-CM | POA: Diagnosis not present

## 2018-07-04 ENCOUNTER — Other Ambulatory Visit: Payer: Self-pay | Admitting: Cardiology

## 2018-08-02 ENCOUNTER — Encounter: Payer: Self-pay | Admitting: Cardiology

## 2018-08-02 ENCOUNTER — Ambulatory Visit: Payer: Medicare HMO | Admitting: Cardiology

## 2018-08-02 VITALS — BP 130/72 | HR 73 | Ht 63.0 in | Wt 201.8 lb

## 2018-08-02 DIAGNOSIS — K743 Primary biliary cirrhosis: Secondary | ICD-10-CM

## 2018-08-02 DIAGNOSIS — M358 Other specified systemic involvement of connective tissue: Secondary | ICD-10-CM

## 2018-08-02 DIAGNOSIS — J8410 Pulmonary fibrosis, unspecified: Secondary | ICD-10-CM | POA: Diagnosis not present

## 2018-08-02 DIAGNOSIS — I7300 Raynaud's syndrome without gangrene: Secondary | ICD-10-CM | POA: Diagnosis not present

## 2018-08-02 DIAGNOSIS — R002 Palpitations: Secondary | ICD-10-CM | POA: Diagnosis not present

## 2018-08-02 DIAGNOSIS — L940 Localized scleroderma [morphea]: Secondary | ICD-10-CM

## 2018-08-02 MED ORDER — AMLODIPINE BESYLATE 2.5 MG PO TABS: 2.5000 mg | ORAL_TABLET | Freq: Every day | ORAL | 11 refills | Status: DC

## 2018-08-02 NOTE — Patient Instructions (Signed)
Medication Instructions:  Start Amlodipine 2.5 mg daily   If you need a refill on your cardiac medications before your next appointment, please call your pharmacy.  Labwork: None Ordered.  Testing/Procedures: None Ordered.   Follow-Up: Your physician wants you to follow-up in: 12 Months with Dr.Crenshaw. You should receive a reminder letter in the mail two months in advance. If you do not receive a letter, please call our office in July 2020 to schedule your follow-up appointment.   Thank you for choosing [facility] at [facility]!!

## 2018-08-02 NOTE — Assessment & Plan Note (Signed)
Pulm fibrosis presumably d/t Scleroderma

## 2018-08-02 NOTE — Assessment & Plan Note (Signed)
Try lower dose Amlodipine

## 2018-08-02 NOTE — Assessment & Plan Note (Signed)
Vague sensation of hard fast heart beat while on Amlodipine 5 mg for Reynaud's

## 2018-08-02 NOTE — Progress Notes (Signed)
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 She had an echo in 2015 that showed normal LVF with moderate LVH. She is in the office today with complaints of "palpitations" which she attributes to Amlodipine 5 mg added by her Rheumatologist for her Raynaud's. She says she felt like her heart rate was fast-"90's" but regular. She felt her heart beat was "hard". She stopped her Amlodipine and she feels like her symptoms got better over a few days. She wants to know if she can try either another medication or possibly a lower dose Amlodipine for her Raynuad's since the Amlodipine did seem to help her Raynaud's.    Current Outpatient Medications  Medication Sig Dispense Refill  . acetaminophen (TYLENOL) 325 MG tablet Take 2 tablets (650 mg total) by mouth every 6 (six) hours as needed for mild pain (or Fever >/= 101). 40 tablet 0  . albuterol (PROVENTIL HFA;VENTOLIN HFA) 108 (90 Base) MCG/ACT inhaler Inhale 2 puffs into the lungs every 6 (six) hours as needed for wheezing. 1 Inhaler 0  . amLODipine (NORVASC) 2.5 MG tablet Take 1 tablet (2.5 mg total) by mouth daily. 30 tablet 11  . aspirin 81 MG chewable tablet Chew 81 mg by mouth daily. Start on 10/26/16 after completion of Xarelto    . bisacodyl (DULCOLAX) 10 MG suppository Place 1 suppository (10 mg total) rectally daily as needed for moderate constipation. 12 suppository 0  . furosemide (LASIX) 40 MG tablet TAKE 1 TABLET TWICE DAILY (NEED MD APPOINTMENT) 180 tablet 0  . hydroxychloroquine (PLAQUENIL) 200 MG tablet Take 400 mg by mouth daily.    . montelukast (SINGULAIR) 10 MG tablet Take 1 tablet (10 mg total) by mouth at bedtime. 30 tablet 11  . pantoprazole (PROTONIX) 40 MG tablet Take 40 mg by mouth 2 (two) times daily.    . predniSONE  (DELTASONE) 5 MG tablet Take 5 mg by mouth daily with breakfast.     . zolpidem (AMBIEN) 10 MG tablet Take 10 mg by mouth at bedtime as needed for sleep.    . montelukast (SINGULAIR) 10 MG tablet Take 1 tablet (10 mg total) by mouth at bedtime. (Patient not taking: Reported on 08/02/2018) 90 tablet 3   No current facility-administered medications for this visit.     Allergies  Allergen Reactions  . Codeine     Neurological problems - hallucinations, "spacey"    Past Medical History:  Diagnosis Date  . Diastolic congestive heart failure ([place])    02/05/15  states no fluid in extremities, breathing same as has been  . GERD (gastroesophageal reflux disease)   . History of blood transfusion   . Hx of cholecystectomy   . Interstitial lung disease ([place])   . Osteoarthritis   . Pulmonary fibrosis ([place])   . Raynaud disease   . Rheumatoid arthritis(714.0)   . Scleroderma ([place])   . Seasonal allergies   . Shortness of breath dyspnea    states normal for her  . Systemic sclerosis ([place])     Social History   Socioeconomic History  . Marital status: Married    Spouse name: Not on file  . Number of children: 3  . Years of education: Not on file  . Highest education level: Not on file  Occupational History  .  Occupation: Optician, dispensing: [employer]  Social Needs  . Financial resource strain: Not on file  . Food insecurity:    Worry: Not on file    Inability: Not on file  . Transportation needs:    Medical: Not on file    Non-medical: Not on file  Tobacco Use  . Smoking status: Former Smoker    Packs/day: 1.00    Years: 20.00    Pack years: 20.00    Types: Cigarettes    Last attempt to quit: 11/30/1988    Years since quitting: 29.6  . Smokeless tobacco: Never Used  Substance and Sexual Activity  . Alcohol use: No  . Drug use: No  . Sexual activity: Not on file  Lifestyle  . Physical activity:    Days per week: Not on file    Minutes per session: Not  on file  . Stress: Not on file  Relationships  . Social connections:    Talks on phone: Not on file    Gets together: Not on file    Attends religious service: Not on file    Active member of club or organization: Not on file    Attends meetings of clubs or organizations: Not on file    Relationship status: Not on file  . Intimate partner violence:    Fear of current or ex partner: Not on file    Emotionally abused: Not on file    Physically abused: Not on file    Forced sexual activity: Not on file  Other Topics Concern  . Not on file  Social History Narrative  . Not on file     Family History  Problem Relation Age of Onset  . Stroke Mother   . Heart disease Mother        CABG at age 45  . Heart attack Father        Died of MI at age 23  . Emphysema Father   . Dementia Brother   . Osteoarthritis Unknown   . Celiac disease Unknown      Review of Systems: General: negative for chills, fever, night sweats or weight changes.  Cardiovascular: negative for chest pain, dyspnea on exertion, edema, orthopnea, palpitations, paroxysmal nocturnal dyspnea or shortness of breath Dermatological: negative for rash Respiratory: negative for cough or wheezing Urologic: negative for hematuria Abdominal: negative for nausea, vomiting, diarrhea, bright red blood per rectum, melena, or hematemesis Neurologic: negative for visual changes, syncope, or dizziness All other systems reviewed and are otherwise negative except as noted above.    Blood pressure 130/72, pulse 73, height _0  (1.6 m), weight 201 lb 12.8 oz (91.5 kg), SpO2 98 %.  General appearance: alert, cooperative, no distress and moderately obese Neck: no carotid bruit and no JVD Lungs: velcro carckles balateraly  Heart: regular rate and rhythm Extremities: no edema Skin: Skin color, texture, turgor normal. No rashes or lesions Neurologic: Grossly normal  EKG NSR  ASSESSMENT AND PLAN:   Palpitations Vague sensation  of hard fast heart beat while on Amlodipine 5 mg for Reynaud's  Pulmonary fibrosis ([facility]) Pulm fibrosis presumably d/t Scleroderma  Raynaud disease Try lower dose Amlodipine   PLAN  I told her to try Amlodipine 2.5 mg- she has previously been intolerant to Diltiazem (palpitations). I explained to her that that was an unusual side effect from those medications.  I did not know of another agent she could take for her Reynaud's.   Kerin Ransom  PA-C 08/02/2018 3:54 PM

## 2018-08-29 DIAGNOSIS — Z23 Encounter for immunization: Secondary | ICD-10-CM | POA: Diagnosis not present

## 2018-09-02 ENCOUNTER — Ambulatory Visit (HOSPITAL_COMMUNITY)
Admission: RE | Admit: 2018-09-02 | Discharge: 2018-09-02 | Disposition: A | Discharge status: Home / Self Care | Payer: Medicare HMO | Source: Ambulatory Visit | Attending: Internal Medicine | Admitting: Internal Medicine

## 2018-09-02 ENCOUNTER — Other Ambulatory Visit (HOSPITAL_COMMUNITY): Payer: Self-pay | Admitting: Internal Medicine

## 2018-09-02 DIAGNOSIS — M25579 Pain in unspecified ankle and joints of unspecified foot: Secondary | ICD-10-CM | POA: Insufficient documentation

## 2018-09-02 DIAGNOSIS — S92111A Displaced fracture of neck of right talus, initial encounter for closed fracture: Secondary | ICD-10-CM | POA: Diagnosis not present

## 2018-09-02 DIAGNOSIS — M25471 Effusion, right ankle: Secondary | ICD-10-CM | POA: Insufficient documentation

## 2018-09-02 DIAGNOSIS — M8580 Other specified disorders of bone density and structure, unspecified site: Secondary | ICD-10-CM | POA: Insufficient documentation

## 2018-09-02 DIAGNOSIS — M25571 Pain in right ankle and joints of right foot: Secondary | ICD-10-CM

## 2018-09-02 DIAGNOSIS — S93401A Sprain of unspecified ligament of right ankle, initial encounter: Secondary | ICD-10-CM | POA: Diagnosis not present

## 2018-10-19 ENCOUNTER — Other Ambulatory Visit: Payer: Self-pay | Admitting: Cardiology

## 2018-10-19 DIAGNOSIS — M340 Progressive systemic sclerosis: Secondary | ICD-10-CM | POA: Diagnosis not present

## 2018-10-19 DIAGNOSIS — L03011 Cellulitis of right finger: Secondary | ICD-10-CM | POA: Diagnosis not present

## 2018-10-19 DIAGNOSIS — M5126 Other intervertebral disc displacement, lumbar region: Secondary | ICD-10-CM | POA: Diagnosis not present

## 2018-10-19 DIAGNOSIS — J849 Interstitial pulmonary disease, unspecified: Secondary | ICD-10-CM | POA: Diagnosis not present

## 2018-10-19 DIAGNOSIS — I7300 Raynaud's syndrome without gangrene: Secondary | ICD-10-CM | POA: Diagnosis not present

## 2018-10-19 DIAGNOSIS — Z6836 Body mass index (BMI) 36.0-36.9, adult: Secondary | ICD-10-CM | POA: Diagnosis not present

## 2018-10-19 DIAGNOSIS — M0579 Rheumatoid arthritis with rheumatoid factor of multiple sites without organ or systems involvement: Secondary | ICD-10-CM | POA: Diagnosis not present

## 2018-10-19 DIAGNOSIS — M150 Primary generalized (osteo)arthritis: Secondary | ICD-10-CM | POA: Diagnosis not present

## 2018-10-19 DIAGNOSIS — E669 Obesity, unspecified: Secondary | ICD-10-CM | POA: Diagnosis not present

## 2018-11-03 DIAGNOSIS — J449 Chronic obstructive pulmonary disease, unspecified: Secondary | ICD-10-CM | POA: Diagnosis not present

## 2018-11-03 DIAGNOSIS — R1312 Dysphagia, oropharyngeal phase: Secondary | ICD-10-CM | POA: Diagnosis not present

## 2018-11-03 DIAGNOSIS — R1013 Epigastric pain: Secondary | ICD-10-CM | POA: Diagnosis not present

## 2018-11-03 DIAGNOSIS — Z1211 Encounter for screening for malignant neoplasm of colon: Secondary | ICD-10-CM | POA: Diagnosis not present

## 2018-11-03 DIAGNOSIS — I509 Heart failure, unspecified: Secondary | ICD-10-CM | POA: Diagnosis not present

## 2018-11-28 DIAGNOSIS — M542 Cervicalgia: Secondary | ICD-10-CM | POA: Diagnosis not present

## 2018-11-29 DIAGNOSIS — M5030 Other cervical disc degeneration, unspecified cervical region: Secondary | ICD-10-CM | POA: Insufficient documentation

## 2018-12-01 DIAGNOSIS — R1013 Epigastric pain: Secondary | ICD-10-CM | POA: Diagnosis not present

## 2018-12-01 DIAGNOSIS — Z1211 Encounter for screening for malignant neoplasm of colon: Secondary | ICD-10-CM | POA: Diagnosis not present

## 2019-03-01 ENCOUNTER — Other Ambulatory Visit: Payer: Self-pay | Admitting: Pulmonary Disease

## 2019-03-02 ENCOUNTER — Other Ambulatory Visit: Payer: Self-pay

## 2019-03-02 MED ORDER — AMLODIPINE BESYLATE 2.5 MG PO TABS: 2.5000 mg | ORAL_TABLET | Freq: Every day | ORAL | 11 refills | Status: DC

## 2019-03-06 ENCOUNTER — Other Ambulatory Visit: Payer: Self-pay

## 2019-03-06 MED ORDER — AMLODIPINE BESYLATE 2.5 MG PO TABS: 2.5000 mg | ORAL_TABLET | Freq: Every day | ORAL | 11 refills | Status: DC

## 2019-03-08 ENCOUNTER — Telehealth: Payer: Self-pay | Admitting: Pulmonary Disease

## 2019-03-08 MED ORDER — MONTELUKAST SODIUM 10 MG PO TABS: 10.0000 mg | ORAL_TABLET | Freq: Every day | ORAL | 0 refills | Status: DC

## 2019-03-08 NOTE — Telephone Encounter (Signed)
Returned phone call to patient, requesting refill of singulair. Confirmed pharmacy. Refill sent. Recall placed for f/u appt in July with BQ. Nothing further is needed at this time.

## 2019-03-10 ENCOUNTER — Other Ambulatory Visit: Payer: Self-pay

## 2019-03-10 MED ORDER — AMLODIPINE BESYLATE 2.5 MG PO TABS: 2.5000 mg | ORAL_TABLET | Freq: Every day | ORAL | 11 refills | Status: DC

## 2019-04-19 DIAGNOSIS — M5126 Other intervertebral disc displacement, lumbar region: Secondary | ICD-10-CM | POA: Diagnosis not present

## 2019-04-19 DIAGNOSIS — L03011 Cellulitis of right finger: Secondary | ICD-10-CM | POA: Diagnosis not present

## 2019-04-19 DIAGNOSIS — M150 Primary generalized (osteo)arthritis: Secondary | ICD-10-CM | POA: Diagnosis not present

## 2019-04-19 DIAGNOSIS — J849 Interstitial pulmonary disease, unspecified: Secondary | ICD-10-CM | POA: Diagnosis not present

## 2019-04-19 DIAGNOSIS — I7300 Raynaud's syndrome without gangrene: Secondary | ICD-10-CM | POA: Diagnosis not present

## 2019-04-19 DIAGNOSIS — M340 Progressive systemic sclerosis: Secondary | ICD-10-CM | POA: Diagnosis not present

## 2019-04-19 DIAGNOSIS — M0579 Rheumatoid arthritis with rheumatoid factor of multiple sites without organ or systems involvement: Secondary | ICD-10-CM | POA: Diagnosis not present

## 2019-04-28 DIAGNOSIS — M0579 Rheumatoid arthritis with rheumatoid factor of multiple sites without organ or systems involvement: Secondary | ICD-10-CM | POA: Diagnosis not present

## 2019-05-01 DIAGNOSIS — N390 Urinary tract infection, site not specified: Secondary | ICD-10-CM | POA: Diagnosis not present

## 2019-05-08 DIAGNOSIS — N3000 Acute cystitis without hematuria: Secondary | ICD-10-CM | POA: Diagnosis not present

## 2019-05-18 DIAGNOSIS — N942 Vaginismus: Secondary | ICD-10-CM | POA: Diagnosis not present

## 2019-06-07 ENCOUNTER — Other Ambulatory Visit: Payer: Self-pay | Admitting: Pulmonary Disease

## 2019-06-12 ENCOUNTER — Telehealth: Payer: Self-pay | Admitting: *Deleted

## 2019-06-12 NOTE — Telephone Encounter (Signed)
A message was left, re: follow up visit.

## 2019-06-13 ENCOUNTER — Telehealth: Payer: Self-pay | Admitting: Pulmonary Disease

## 2019-06-13 DIAGNOSIS — R0602 Shortness of breath: Secondary | ICD-10-CM

## 2019-06-13 DIAGNOSIS — J8410 Pulmonary fibrosis, unspecified: Secondary | ICD-10-CM

## 2019-06-13 NOTE — Telephone Encounter (Signed)
Pt advised to have covid testing 1st. Once resulted we will call with how to move forward in scheduling.. Per BM tentatively end of next week or beginning of following week to further evaluate her ILD. 06/21/19 _0 :30 tentatively pending covid resuls. Nothing further needed.

## 2019-06-13 NOTE — Telephone Encounter (Signed)
Primary Pulmonologist: BQ Last office visit and with whom: 02/25/2018 with BQ What do we see them for (pulmonary problems):allergic rhinitis/SOB Last OV assessment/plan: Instructions    Return in about 1 month (around 03/25/2018). Autoimmune associated interstitial lung disease: We will arrange for a high-resolution CT scan of the chest to see if there is worsening disease, I am a bit concerned about this because of the change in your pulmonary function test and the increase in symptoms you have had recently  Poorly controlled allergic rhinitis: Start taking generic Flonase or Nasacort 2 sprays each nostril Take Singulair 1 pill daily (montelukast) Continue taking Allegra as you are doing Use saline rinses regularly, I recommend at least 2 times a day, 30 minutes prior to using the nasal steroid  Rheumatoid arthritis and scleroderma: Continue plaque canal and prednisone as directed by the rheumatology clinic Depending on the changes in your high-resolution CT scan of the chest we may need to consider a different form of immunosuppression  We will see you back in 2-4 weeks or sooner if needed to go over the results of the high-resolution CT      Was appointment offered to patient (explain)?  Pt wants recommendations (pt knows needs appt)   Reason for call: called and spoke with pt she has pain in her upper back that is worse at night and states the pain radiates to the front part of her chest. Pt stated she has had increased SOB and cough and stated the last couple weeks have been worse than usual but she wonders if it could be her lung disease worsening.  Pt states that she is unsure if she is coughing up any mucus.  Pt denies any complaints of fever, no nausea/vomiting, no body aches or chills.  Pt has not had to be tested for COVID, has not been around anyone that has been sick, and has not done any traveling within the last 30 days.  Pt has had to use her rescue inhaler about 3  times daily to see if it would help with her symptoms. Pt said that she does not have to use her rescue inhaler every day. Pt said she does get some relief when using the inhaler.  Pt is taking singulair, allegra, and prednisone daily.   I stated to pt that we needed to get her scheduled for an appt due to the last time that she was seen but stated that I would check with provider to see if visit needed to be an in office visit or a televisit and pt verbalized understanding. Aaron Edelman, please advise on this for pt. Thanks!

## 2019-06-13 NOTE — Telephone Encounter (Signed)
06/13/2019 1549  I have placed an order for the patient to be COVID tested.  Please instruct her to present to [facility], [place] or [place] whichever location is closest to her.  Once patient is confirmed COVID negative that we can have an office visit to further evaluate her symptoms.  Patient can tentatively be scheduled for end of the week next week or beginning of the following week to further evaluate her interstitial lung disease.  Wyn Quaker, FNP

## 2019-06-14 ENCOUNTER — Other Ambulatory Visit: Payer: Self-pay

## 2019-06-14 ENCOUNTER — Other Ambulatory Visit: Payer: Medicare HMO

## 2019-06-14 DIAGNOSIS — J8410 Pulmonary fibrosis, unspecified: Secondary | ICD-10-CM | POA: Diagnosis not present

## 2019-06-14 DIAGNOSIS — Z20822 Contact with and (suspected) exposure to covid-19: Secondary | ICD-10-CM

## 2019-06-14 DIAGNOSIS — R0602 Shortness of breath: Secondary | ICD-10-CM | POA: Diagnosis not present

## 2019-06-18 LAB — NOVEL CORONAVIRUS, NAA: SARS-CoV-2, NAA: NOT DETECTED

## 2019-06-19 ENCOUNTER — Telehealth: Payer: Self-pay | Admitting: Pulmonary Disease

## 2019-06-19 NOTE — Telephone Encounter (Signed)
Patient's COVID test is negative.  Patient can be scheduled for earlier appointment today or tomorrow if patient would like.  If patient wants to keep 06/21/2019 already scheduled office visit that is okay as well.Wyn Quaker, FNP

## 2019-06-19 NOTE — Telephone Encounter (Signed)
Ok   [name]

## 2019-06-19 NOTE — Telephone Encounter (Signed)
Advised pt of results. Pt understood and nothing further is needed.

## 2019-06-21 ENCOUNTER — Ambulatory Visit: Payer: Medicare HMO | Admitting: Pulmonary Disease

## 2019-07-07 DIAGNOSIS — S62309A Unspecified fracture of unspecified metacarpal bone, initial encounter for closed fracture: Secondary | ICD-10-CM | POA: Insufficient documentation

## 2019-07-07 DIAGNOSIS — S62327A Displaced fracture of shaft of fifth metacarpal bone, left hand, initial encounter for closed fracture: Secondary | ICD-10-CM | POA: Diagnosis not present

## 2019-07-07 DIAGNOSIS — M79642 Pain in left hand: Secondary | ICD-10-CM | POA: Diagnosis not present

## 2019-07-10 DIAGNOSIS — S62327A Displaced fracture of shaft of fifth metacarpal bone, left hand, initial encounter for closed fracture: Secondary | ICD-10-CM | POA: Diagnosis not present

## 2019-07-19 DIAGNOSIS — M79642 Pain in left hand: Secondary | ICD-10-CM | POA: Diagnosis not present

## 2019-07-19 DIAGNOSIS — S62327A Displaced fracture of shaft of fifth metacarpal bone, left hand, initial encounter for closed fracture: Secondary | ICD-10-CM | POA: Diagnosis not present

## 2019-07-19 DIAGNOSIS — Z4789 Encounter for other orthopedic aftercare: Secondary | ICD-10-CM | POA: Diagnosis not present

## 2019-07-27 DIAGNOSIS — M79642 Pain in left hand: Secondary | ICD-10-CM | POA: Diagnosis not present

## 2019-08-16 DIAGNOSIS — S62327A Displaced fracture of shaft of fifth metacarpal bone, left hand, initial encounter for closed fracture: Secondary | ICD-10-CM | POA: Diagnosis not present

## 2019-08-16 DIAGNOSIS — M79642 Pain in left hand: Secondary | ICD-10-CM | POA: Diagnosis not present

## 2019-08-16 DIAGNOSIS — Z4789 Encounter for other orthopedic aftercare: Secondary | ICD-10-CM | POA: Diagnosis not present

## 2019-08-24 DIAGNOSIS — M79642 Pain in left hand: Secondary | ICD-10-CM | POA: Diagnosis not present

## 2019-08-30 DIAGNOSIS — S62327A Displaced fracture of shaft of fifth metacarpal bone, left hand, initial encounter for closed fracture: Secondary | ICD-10-CM | POA: Diagnosis not present

## 2019-08-30 DIAGNOSIS — Z4789 Encounter for other orthopedic aftercare: Secondary | ICD-10-CM | POA: Diagnosis not present

## 2019-08-30 DIAGNOSIS — M79642 Pain in left hand: Secondary | ICD-10-CM | POA: Diagnosis not present

## 2019-08-31 DIAGNOSIS — Z23 Encounter for immunization: Secondary | ICD-10-CM | POA: Diagnosis not present

## 2019-09-04 NOTE — Progress Notes (Signed)
HPI: FU diastolic CHF; also with history of HTN, CREST/scleroderma and ILD. Echo (03/12/14): Mod LVH, EF 65-70%, no RWMA, Gr 1 DD, mild LAE, mild TR. PFTs 4/15 with FEV1 1.54 and DLCO 68% predicted. LHC (03/15/14): RA 7, RV 43/7/9, PA 43/18 (mean 31), PCWP 25, CO 6.9, CI 3.45, no CAD. Placed on lasix. ABIs May 2016 normal. Chest CT April 2019 showed pulmonary fibrosis, cirrhosis/steatosis and coronary calcification.  Since last seen, she does have dyspnea on exertion but no orthopnea, PND, pedal edema, exertional chest pain or syncope.  Current Outpatient Medications  Medication Sig Dispense Refill  . acetaminophen (TYLENOL) 325 MG tablet Take 2 tablets (650 mg total) by mouth every 6 (six) hours as needed for mild pain (or Fever >/= 101). 40 tablet 0  . albuterol (PROVENTIL HFA;VENTOLIN HFA) 108 (90 Base) MCG/ACT inhaler Inhale 2 puffs into the lungs every 6 (six) hours as needed for wheezing. 1 Inhaler 0  . amLODipine (NORVASC) 2.5 MG tablet Take 1 tablet (2.5 mg total) by mouth daily. 30 tablet 11  . aspirin 81 MG chewable tablet Chew 81 mg by mouth daily. Start on 10/26/16 after completion of Xarelto    . bisacodyl (DULCOLAX) 10 MG suppository Place 1 suppository (10 mg total) rectally daily as needed for moderate constipation. 12 suppository 0  . furosemide (LASIX) 40 MG tablet TAKE 1 TABLET TWICE DAILY (NEED MD APPOINTMENT) 180 tablet 3  . hydroxychloroquine (PLAQUENIL) 200 MG tablet Take 400 mg by mouth daily.    . montelukast (SINGULAIR) 10 MG tablet TAKE 1 TABLET AT BEDTIME 90 tablet 0  . pantoprazole (PROTONIX) 40 MG tablet Take 40 mg by mouth 2 (two) times daily.    . predniSONE (DELTASONE) 5 MG tablet Take 5 mg by mouth daily with breakfast.     . zolpidem (AMBIEN) 10 MG tablet Take 10 mg by mouth at bedtime as needed for sleep.     No current facility-administered medications for this visit.      Past Medical History:  Diagnosis Date  . Diastolic congestive heart failure  ([place])    02/05/15  states no fluid in extremities, breathing same as has been  . GERD (gastroesophageal reflux disease)   . History of blood transfusion   . Hx of cholecystectomy   . Interstitial lung disease ([place])   . Osteoarthritis   . Pulmonary fibrosis ([place])   . Raynaud disease   . Rheumatoid arthritis(714.0)   . Scleroderma ([place])   . Seasonal allergies   . Shortness of breath dyspnea    states normal for her  . Systemic sclerosis [facility])     Past Surgical History:  Procedure Laterality Date  . ABDOMINAL HYSTERECTOMY    . BLADDER SUSPENSION    . CARPAL TUNNEL RELEASE    . LAPAROSCOPIC CHOLECYSTECTOMY    . LEFT HEART CATHETERIZATION WITH CORONARY/GRAFT ANGIOGRAM  03/15/2014   Procedure: LEFT HEART CATHETERIZATION WITH Beatrix Fetters;  Surgeon: Burnell Blanks, MD;  Location: [facility] CATH LAB;  Service: Cardiovascular;;  . TONSILLECTOMY    . TOTAL KNEE ARTHROPLASTY Left 02/11/2015   Procedure: LEFT TOTAL KNEE ARTHROPLASTY, RIGHT KNEE CORTISONE INJECTION;  Surgeon: Gaynelle Arabian, MD;  Location: [facility] ORS;  Service: Orthopedics;  Laterality: Left;  . TOTAL KNEE ARTHROPLASTY Right 10/05/2016   Procedure: RIGHT TOTAL KNEE ARTHROPLASTY;  Surgeon: Gaynelle Arabian, MD;  Location: [facility] ORS;  Service: Orthopedics;  Laterality: Right;  Marland Kitchen [place]  History   Socioeconomic History  . Marital status: Married    Spouse name: Not on file  . Number of children: 3  . Years of education: Not on file  . Highest education level: Not on file  Occupational History  . Occupation: Optician, dispensing: [employer]  Social Needs  . Financial resource strain: Not on file  . Food insecurity    Worry: Not on file    Inability: Not on file  . Transportation needs    Medical: Not on file    Non-medical: Not on file  Tobacco Use  . Smoking status: Former Smoker    Packs/day: 1.00    Years: 20.00    Pack years: 20.00    Types:  Cigarettes    Quit date: 11/30/1988    Years since quitting: 30.8  . Smokeless tobacco: Never Used  Substance and Sexual Activity  . Alcohol use: No  . Drug use: No  . Sexual activity: Not on file  Lifestyle  . Physical activity    Days per week: Not on file    Minutes per session: Not on file  . Stress: Not on file  Relationships  . Social Herbalist on phone: Not on file    Gets together: Not on file    Attends religious service: Not on file    Active member of club or organization: Not on file    Attends meetings of clubs or organizations: Not on file    Relationship status: Not on file  . Intimate partner violence    Fear of current or ex partner: Not on file    Emotionally abused: Not on file    Physically abused: Not on file    Forced sexual activity: Not on file  Other Topics Concern  . Not on file  Social History Narrative  . Not on file    Family History  Problem Relation Age of Onset  . Stroke Mother   . Heart disease Mother        CABG at age 37  . Heart attack Father        Died of MI at age 31  . Emphysema Father   . Dementia Brother   . Osteoarthritis Unknown   . Celiac disease Unknown     ROS: no fevers or chills, productive cough, hemoptysis, dysphasia, odynophagia, melena, hematochezia, dysuria, hematuria, rash, seizure activity, orthopnea, PND, pedal edema, claudication. Remaining systems are negative.  Physical Exam: Well-developed well-nourished in no acute distress.  Skin is warm and dry.  HEENT is normal.  Neck is supple.  Chest is clear to auscultation with normal expansion.  Cardiovascular exam is regular rate and rhythm.  Abdominal exam nontender or distended. No masses palpated. Extremities show no edema. neuro grossly intact  ECG-sinus rhythm at a rate of 77, normal axis, no ST changes.  Personally reviewed  A/P  1 palpitations-symptoms are reasonably well contr dyspnea olled.  We can consider a monitor in the future if  her symptoms worsen.  2 hypertension-blood pressure is controlled.  Continue present medications and follow.  3 chronic diastolic congestive heart failure-she continues to have dyspnea on exertion.  Her present regimen is Lasix 40 mg twice daily but she states she occasionally forgets her afternoon dose.  We will therefore change to 80 mg once a day to see if this improves her symptoms.  Check potassium and renal function in 1 week.  4 Coronary atherosclerosis-patient  noted to have calcium in her coronaries on previous CT scan.  However previous catheterization did not reveal significant coronary disease.  Kirk Ruths, MD

## 2019-09-05 ENCOUNTER — Other Ambulatory Visit: Payer: Self-pay | Admitting: Pulmonary Disease

## 2019-09-15 ENCOUNTER — Ambulatory Visit (INDEPENDENT_AMBULATORY_CARE_PROVIDER_SITE_OTHER): Payer: Medicare HMO | Admitting: Cardiology

## 2019-09-15 ENCOUNTER — Other Ambulatory Visit: Payer: Self-pay

## 2019-09-15 ENCOUNTER — Encounter: Payer: Self-pay | Admitting: Cardiology

## 2019-09-15 VITALS — BP 132/72 | HR 77 | Temp 97.5°F | Ht 63.0 in | Wt 207.0 lb

## 2019-09-15 DIAGNOSIS — R002 Palpitations: Secondary | ICD-10-CM

## 2019-09-15 DIAGNOSIS — I5032 Chronic diastolic (congestive) heart failure: Secondary | ICD-10-CM

## 2019-09-15 DIAGNOSIS — I10 Essential (primary) hypertension: Secondary | ICD-10-CM

## 2019-09-15 DIAGNOSIS — I110 Hypertensive heart disease with heart failure: Secondary | ICD-10-CM | POA: Diagnosis not present

## 2019-09-15 MED ORDER — FUROSEMIDE 40 MG PO TABS: 80.0000 mg | ORAL_TABLET | Freq: Every day | ORAL | 3 refills | Status: DC

## 2019-09-15 NOTE — Patient Instructions (Signed)
Medication Instructions:  CHANGE FUROSEMIDE TO 80 MG ONCE DAILY= 2 OF THE 40 MG TABLETS ONCE DAILY  *If you need a refill on your cardiac medications before your next appointment, please call your pharmacy*  Lab Work: Your physician recommends that you return for lab work in: [place]  If you have labs (blood work) drawn today and your tests are completely normal, you will receive your results only by: Marland Kitchen MyChart Message (if you have MyChart) OR . A paper copy in the mail If you have any lab test that is abnormal or we need to change your treatment, we will call you to review the results.  Follow-Up: At [facility], you and your health needs are our priority.  As part of our continuing mission to provide you with exceptional heart care, we have created designated Provider Care Teams.  These Care Teams include your primary Cardiologist (physician) and Advanced Practice Providers (APPs -  Physician Assistants and Nurse Practitioners) who all work together to provide you with the care you need, when you need it.  Your next appointment:   6 months  The format for your next appointment:   In Person  Provider:   Kirk Ruths, MD

## 2019-09-19 ENCOUNTER — Other Ambulatory Visit (INDEPENDENT_AMBULATORY_CARE_PROVIDER_SITE_OTHER): Payer: Medicare HMO

## 2019-09-19 DIAGNOSIS — I10 Essential (primary) hypertension: Secondary | ICD-10-CM

## 2019-09-19 DIAGNOSIS — R002 Palpitations: Secondary | ICD-10-CM | POA: Diagnosis not present

## 2019-09-19 DIAGNOSIS — I5032 Chronic diastolic (congestive) heart failure: Secondary | ICD-10-CM

## 2019-09-22 ENCOUNTER — Encounter: Payer: Self-pay | Admitting: Cardiology

## 2019-09-22 DIAGNOSIS — R002 Palpitations: Secondary | ICD-10-CM | POA: Diagnosis not present

## 2019-09-23 LAB — BASIC METABOLIC PANEL WITH GFR
BUN/Creatinine Ratio: 18 (ref 12–28)
BUN: 14 mg/dL (ref 8–27)
CO2: 29 mmol/L (ref 20–29)
Calcium: 9.8 mg/dL (ref 8.7–10.3)
Chloride: 97 mmol/L (ref 96–106)
Creatinine, Ser: 0.8 mg/dL (ref 0.57–1.00)
GFR calc Af Amer: 86 mL/min/{1.73_m2}
GFR calc non Af Amer: 74 mL/min/{1.73_m2}
Glucose: 133 mg/dL — ABNORMAL HIGH (ref 65–99)
Potassium: 4.2 mmol/L (ref 3.5–5.2)
Sodium: 140 mmol/L (ref 134–144)

## 2019-10-09 DIAGNOSIS — M5126 Other intervertebral disc displacement, lumbar region: Secondary | ICD-10-CM | POA: Diagnosis not present

## 2019-10-09 DIAGNOSIS — J849 Interstitial pulmonary disease, unspecified: Secondary | ICD-10-CM | POA: Diagnosis not present

## 2019-10-09 DIAGNOSIS — M340 Progressive systemic sclerosis: Secondary | ICD-10-CM | POA: Diagnosis not present

## 2019-10-09 DIAGNOSIS — L03011 Cellulitis of right finger: Secondary | ICD-10-CM | POA: Diagnosis not present

## 2019-10-09 DIAGNOSIS — M150 Primary generalized (osteo)arthritis: Secondary | ICD-10-CM | POA: Diagnosis not present

## 2019-10-09 DIAGNOSIS — I7300 Raynaud's syndrome without gangrene: Secondary | ICD-10-CM | POA: Diagnosis not present

## 2019-10-09 DIAGNOSIS — M0579 Rheumatoid arthritis with rheumatoid factor of multiple sites without organ or systems involvement: Secondary | ICD-10-CM | POA: Diagnosis not present

## 2019-10-12 ENCOUNTER — Ambulatory Visit: Payer: Medicare HMO | Admitting: Podiatry

## 2019-10-12 ENCOUNTER — Encounter: Payer: Self-pay | Admitting: Podiatry

## 2019-10-12 ENCOUNTER — Other Ambulatory Visit: Payer: Self-pay

## 2019-10-12 DIAGNOSIS — M79671 Pain in right foot: Secondary | ICD-10-CM

## 2019-10-12 DIAGNOSIS — I7300 Raynaud's syndrome without gangrene: Secondary | ICD-10-CM | POA: Diagnosis not present

## 2019-10-12 DIAGNOSIS — D689 Coagulation defect, unspecified: Secondary | ICD-10-CM | POA: Diagnosis not present

## 2019-10-12 DIAGNOSIS — Q828 Other specified congenital malformations of skin: Secondary | ICD-10-CM

## 2019-10-12 DIAGNOSIS — M79672 Pain in left foot: Secondary | ICD-10-CM

## 2019-10-13 ENCOUNTER — Encounter: Payer: Self-pay | Admitting: Podiatry

## 2019-10-13 NOTE — Progress Notes (Signed)
Subjective:  Patient ID: [name], female    DOB: 07/31/1946,  MRN: [id number]  Chief Complaint  Patient presents with  . Callouses    Painful callous lesion bottom of bilat 5th met, left worse than right    73 y.o. female presents with the above complaint.  Patient presents with bilateral painful callus formation.  She states that they hurt when she is walking.  She denies doing anything else for them.  She states that it feels like walking on walk when they get worse.  She denies any other acute complaints.   Review of Systems: Negative except as noted in the HPI. Denies N/V/F/Ch.  Past Medical History:  Diagnosis Date  . Diastolic congestive heart failure ([place])    02/05/15  states no fluid in extremities, breathing same as has been  . GERD (gastroesophageal reflux disease)   . History of blood transfusion   . Hx of cholecystectomy   . Interstitial lung disease ([place])   . Osteoarthritis   . Pulmonary fibrosis ([place])   . Raynaud disease   . Rheumatoid arthritis(714.0)   . Scleroderma ([place])   . Seasonal allergies   . Shortness of breath dyspnea    states normal for her  . Systemic sclerosis ([facility])     Current Outpatient Medications:  .  fexofenadine-pseudoephedrine (ALLEGRA-D 24) 180-240 MG 24 hr tablet, Take 1 tablet by mouth daily., Disp: , Rfl:  .  zolpidem (AMBIEN) 10 MG tablet, Take 10 mg by mouth at bedtime as needed for sleep., Disp: , Rfl:  .  acetaminophen (TYLENOL) 325 MG tablet, Take 2 tablets (650 mg total) by mouth every 6 (six) hours as needed for mild pain (or Fever >/= 101)., Disp: 40 tablet, Rfl: 0 .  albuterol (PROVENTIL HFA;VENTOLIN HFA) 108 (90 Base) MCG/ACT inhaler, Inhale 2 puffs into the lungs every 6 (six) hours as needed for wheezing., Disp: 1 Inhaler, Rfl: 0 .  amLODipine (NORVASC) 2.5 MG tablet, Take 1 tablet (2.5 mg total) by mouth daily., Disp: 30 tablet, Rfl: 11 .  aspirin 81 MG chewable tablet, Chew 81 mg by mouth daily. Start on 10/26/16  after completion of Xarelto, Disp: , Rfl:  .  furosemide (LASIX) 40 MG tablet, Take 2 tablets (80 mg total) by mouth daily., Disp: 90 tablet, Rfl: 3 .  hydroxychloroquine (PLAQUENIL) 200 MG tablet, Take 400 mg by mouth daily., Disp: , Rfl:  .  pantoprazole (PROTONIX) 40 MG tablet, Take 40 mg by mouth 2 (two) times daily., Disp: , Rfl:  .  predniSONE (DELTASONE) 5 MG tablet, Take 5 mg by mouth daily with breakfast. , Disp: , Rfl:   Social History   Tobacco Use  Smoking Status Former Smoker  . Packs/day: 1.00  . Years: 20.00  . Pack years: 20.00  . Types: Cigarettes  . Quit date: 11/30/1988  . Years since quitting: 30.8  Smokeless Tobacco Never Used    Allergies  Allergen Reactions  . Codeine     Neurological problems - hallucinations, "spacey"   Objective:  There were no vitals filed for this visit. There is no height or weight on file to calculate BMI. Constitutional Well developed. Well nourished.  Vascular Dorsalis pedis pulses palpable bilaterally. Posterior tibial pulses palpable bilaterally. Capillary refill normal to all digits.  No cyanosis or clubbing noted. Pedal hair growth normal.  Neurologic Normal speech. Oriented to person, place, and time. Epicritic sensation to light touch grossly present bilaterally.  Dermatologic  hyperkeratotic lesion noted bilateral submet  5.  Upon debridement no pinpoint bleeding noted.  No surrounding cellulitis or underlying ulcer noted.  Orthopedic: Normal joint ROM without pain or crepitus bilaterally. No visible deformities. No bony tenderness.   Radiographs: None Assessment:   1. Porokeratosis   2. Bilateral foot pain    Plan:  Patient was evaluated and treated and all questions answered.  Bilateral hyperkeratotic lesion/porokeratosis -Using a chisel blade and a handle, that the lesions were debrided down aggressively down to healthy striated tissue.  No pinpoint bleeding or other complications noted. -I explained to the  patient the etiology of hyperkeratotic lesion and various treatment options available. -Patient left my office fully informed with all questions answered  Return if symptoms worsen or fail to improve.

## 2019-11-27 ENCOUNTER — Telehealth: Payer: Self-pay | Admitting: Internal Medicine

## 2019-11-27 ENCOUNTER — Encounter: Payer: Self-pay | Admitting: Internal Medicine

## 2019-11-27 ENCOUNTER — Ambulatory Visit: Payer: Medicare HMO | Admitting: Internal Medicine

## 2019-11-27 ENCOUNTER — Other Ambulatory Visit: Payer: Self-pay

## 2019-11-27 VITALS — BP 132/70 | HR 86 | Ht 63.0 in | Wt 204.0 lb

## 2019-11-27 DIAGNOSIS — R059 Cough, unspecified: Secondary | ICD-10-CM

## 2019-11-27 DIAGNOSIS — J8489 Other specified interstitial pulmonary diseases: Secondary | ICD-10-CM | POA: Diagnosis not present

## 2019-11-27 DIAGNOSIS — M359 Systemic involvement of connective tissue, unspecified: Secondary | ICD-10-CM

## 2019-11-27 DIAGNOSIS — R0600 Dyspnea, unspecified: Secondary | ICD-10-CM

## 2019-11-27 DIAGNOSIS — J849 Interstitial pulmonary disease, unspecified: Secondary | ICD-10-CM

## 2019-11-27 DIAGNOSIS — R0989 Other specified symptoms and signs involving the circulatory and respiratory systems: Secondary | ICD-10-CM | POA: Diagnosis not present

## 2019-11-27 DIAGNOSIS — R05 Cough: Secondary | ICD-10-CM | POA: Diagnosis not present

## 2019-11-27 DIAGNOSIS — R053 Chronic cough: Secondary | ICD-10-CM

## 2019-11-27 DIAGNOSIS — R0609 Other forms of dyspnea: Secondary | ICD-10-CM

## 2019-11-27 LAB — CBC WITH DIFFERENTIAL/PLATELET
Basophils Absolute: 0.1 10*3/uL (ref 0.0–0.1)
Basophils Relative: 1 % (ref 0.0–3.0)
Eosinophils Absolute: 0.1 10*3/uL (ref 0.0–0.7)
Eosinophils Relative: 0.9 % (ref 0.0–5.0)
HCT: 44.4 % (ref 36.0–46.0)
Hemoglobin: 14.8 g/dL (ref 12.0–15.0)
Lymphocytes Relative: 16.7 % (ref 12.0–46.0)
Lymphs Abs: 2.1 10*3/uL (ref 0.7–4.0)
MCHC: 33.2 g/dL (ref 30.0–36.0)
MCV: 96.1 fl (ref 78.0–100.0)
Monocytes Absolute: 1 10*3/uL (ref 0.1–1.0)
Monocytes Relative: 8 % (ref 3.0–12.0)
Neutro Abs: 9 10*3/uL — ABNORMAL HIGH (ref 1.4–7.7)
Neutrophils Relative %: 73.4 % (ref 43.0–77.0)
Platelets: 291 10*3/uL (ref 150.0–400.0)
RBC: 4.63 Mil/uL (ref 3.87–5.11)
RDW: 12.7 % (ref 11.5–15.5)
WBC: 12.3 10*3/uL — ABNORMAL HIGH (ref 4.0–10.5)

## 2019-11-27 NOTE — Progress Notes (Signed)
Synopsis: Former patient of Dr. Lyda Jester who has a history of rheumatoid arthritis and systemic sclerosis. She was noted and it least 2009 to have pulmonary fibrosis. She was followed initially at [facility] rheumatology and pulmonary clinics transferred her care to physicians in [place]. She has been treated with Humira which controlled her joint symptoms significantly. She retired in 2014 inserted. Since some dyspnea afterwards. She had a right heart cath in April 2015 which showed the following: Hemodynamic Findings:  Ao: 139/66  LV: 149/10/20  RA: 7  RV: 43/7/9  PA: 43/18 (mean 31)  PCWP: 25  Fick Cardiac Output: 6.9 L/min  Fick Cardiac Index: 3.45 L/min/m2  Central Aortic Saturation: 93%  Pulmonary Artery Saturation: 72%  Dr. Joya Gaskins summarize the workup for her pulmonary fibrosis as follows: Pulm fibrosis presumably d/t Scleroderma No esophageal involvement. + raynaud, sclerodactyly, telangiectasias,  pulm fibrosis.  No evident Pulm HTN at last check in 2010.  Rheum: Dr Alanda Amass at [facility]  HPI OV MARCH 2019 Chief Complaint  Patient presents with  . Follow-up    review PFT.  pt c/o excess PND.     She has a lot of sinus problems: > She says that she is coughing up a lot of mucus in the mornings Has been taking Allegra-D which is helpful She is not using any sort of nasal steroid No Singulair  RA and systemic sclerosis > no longer taking Humira because she was concerned about her immune system being poorly controlled > just taking prednisone and plaquenil > Stop taking Humira at least 2 years ago Has been having more problems with her fingers and digit abnormalities  Increasing shortness of breath over the last year with any activity.  No leg swelling no chest pain.  OV 11/27/2019  Subjective:  Patient ID: [name], female , DOB: 1946-06-15 , age 73 y.o. , MRN: [id number] , ADDRESS: [id number] [id number]   11/27/2019 -   Chief  Complaint  Patient presents with  . Follow-up    Former BQ pt. Pt states she does become SOB with activities and also at times when at rest. Pt also has an occ cough which is worse in the morning and will cough up clear phlegm.   Follow-up interstitial lung disease secondary to connective tissue disease [rheumatologist Tacey Heap, M.D., M.H.S. at [facility] but now Dr Richardson Landry at  [facility])  ILD: Dr Joya Gaskins -> Dr [place] Bells -> DR Chase Caller at ILD center [place] ,  HPI [name] 73 y.o. -presents to the ILD clinic for evaluation of her interstitial lung disease secondary to connective tissue disease (rheumatoid factor 90, ANA 1: 640, ANA second titer 1: 2000 560, positive SCL 70 August 2012 at [facility] and positive [place].  I am meeting her for the first time.  Her rheumatology situation is managed by Dr. Richardson Landry at [place].  She no longer follows at [facility] after the doctor they relocated to [place].  She is maintained on 5 mg prednisone per day.  She is not on any other immunomodulators.  Her last TNF alpha blockade was over 3 years ago.  She is known to have interstitial lung disease.  Last staged in March 2019.  She tells me that since then she continues to have significant amount of cough and shortness of breath.  She believes the shortness of breath is slightly worse.  She thinks her pulmonary issues itself is stable.  She  thinks the worsening shortness of breath and the persistent shortness of breath could be due to cardiac issues.  In 2015 she had a right heart catheterization evaluation that was normal.  Last echo was in 2015.  No further work-up since then.  Dyspnea symptom score is listed below.  She in addition she also has significant amount of cough that she attributes to sinus drainage.  She is not had a CT scan of the sinus.  She says she is allergic to a few things but last tested several years ago.  She does not remember when.  She  has seen ENT many years ago does not know the details.  At this point in time she would love to have her symptoms better managed.   SYMPTOM SCALE - ILD 11/27/2019   O2 use ra  Shortness of Breath 0 -> 5 scale with 5 being worst (score 6 If unable to do)  At rest 0  Simple tasks - showers, clothes change, eating, shaving 1  Household (dishes, doing bed, laundry) 2  Shopping 2  Walking level at own pace 1  Walking keeping up with others of same age 80  Walking up Stairs 3  Walking up [place] 4  Total (40 - 48) Dyspnea Score 15  How bad is your cough? moderat  How bad is your fatigue moderate        Results for SHEREENA, BERQUIST (MRN [id number]) as of 11/27/2019 12:15  Ref. Range 09/05/2013 15:00 03/07/2014 12:09 02/07/2015 10:34 09/22/2016 15:33 02/25/2018 14:59  FVC-Pre Latest Units: L ? 2.03 2.04 1.97 1.94 1.75  FVC-%Pred-Pre Latest Units: % 68 70 68 68 60   Results for MILYN, STAPLETON (MRN [id number]) as of 11/27/2019 12:15  Ref. Range 09/05/2013 15:00 03/07/2014 12:09 02/07/2015 10:34 09/22/2016 15:33 02/25/2018 14:59  DLCO unc Latest Units: ml/min/mmHg 14.81 14.71 15.60 15.97 16.44  DLCO unc % pred Latest Units: % 68 68 72 74 71    IMPRESSION: HRCT Lungs/Pleura: Subpleural reticulation and traction bronchiectasis/bronchiolectasis are again seen without a definite zonal predominance. Findings are worst in the right middle lobe. Pattern and distribution appear stable from prior exams. No pleural fluid. Airway is unremarkable. No air trapping. 1. Pulmonary parenchymal pattern of fibrosis is stable from prior exams and likely due to fibrotic nonspecific interstitial pneumonitis. 2. Cirrhosis, steatosis. 3. Aortic atherosclerosis (ICD10-170.0). Coronary artery calcification.   Electronically Signed   By: Lorin Picket M.D.   On: 03/17/2018 09:09  ROS - per HPI     has a past medical history of Diastolic congestive heart failure ([place]), GERD (gastroesophageal reflux  disease), History of blood transfusion, cholecystectomy, Interstitial lung disease ([place]), Osteoarthritis, Pulmonary fibrosis ([place]), Raynaud disease, Rheumatoid arthritis(714.0), Scleroderma ([place]), Seasonal allergies, Shortness of breath dyspnea, and Systemic sclerosis ([place]).   reports that she quit smoking about 31 years ago. Her smoking use included cigarettes. She has a 20.00 pack-year smoking history. She has never used smokeless tobacco.  Past Surgical History:  Procedure Laterality Date  . ABDOMINAL HYSTERECTOMY    . BLADDER SUSPENSION    . CARPAL TUNNEL RELEASE    . LAPAROSCOPIC CHOLECYSTECTOMY    . LEFT HEART CATHETERIZATION WITH CORONARY/GRAFT ANGIOGRAM  03/15/2014   Procedure: LEFT HEART CATHETERIZATION WITH Beatrix Fetters;  Surgeon: Burnell Blanks, MD;  Location: [facility] CATH LAB;  Service: Cardiovascular;;  . TONSILLECTOMY    . TOTAL KNEE ARTHROPLASTY Left 02/11/2015   Procedure: LEFT TOTAL KNEE ARTHROPLASTY, RIGHT KNEE CORTISONE INJECTION;  Surgeon: Gaynelle Arabian, MD;  Location: [facility] ORS;  Service: Orthopedics;  Laterality: Left;  . TOTAL KNEE ARTHROPLASTY Right 10/05/2016   Procedure: RIGHT TOTAL KNEE ARTHROPLASTY;  Surgeon: Gaynelle Arabian, MD;  Location: [facility] ORS;  Service: Orthopedics;  Laterality: Right;  Marland Kitchen VESICOVAGINAL FISTULA CLOSURE W/ TAH  1987    Allergies  Allergen Reactions  . Codeine     Neurological problems - hallucinations, "spacey"    Immunization History  Administered Date(s) Administered  . Influenza Split 08/01/2011, 09/02/2012, 08/30/2013, 08/29/2016  . Influenza, High Dose Seasonal PF 08/28/2017  . Influenza,inj,Quad PF,6+ Mos 09/03/2014  . Influenza-Unspecified 08/14/2014, 08/21/2015  . Pneumococcal Conjugate-13 09/19/2014, 11/28/2014  . Pneumococcal Polysaccharide-23 11/30/2008, 08/30/2009  . Zoster 11/30/2008    Family History  Problem Relation Age of Onset  . Stroke Mother   . Heart disease Mother        CABG at age 10  . Heart  attack Father        Died of MI at age 47  . Emphysema Father   . Dementia Brother   . Osteoarthritis Unknown   . Celiac disease Unknown      Current Outpatient Medications:  .  acetaminophen (TYLENOL) 325 MG tablet, Take 2 tablets (650 mg total) by mouth every 6 (six) hours as needed for mild pain (or Fever >/= 101)., Disp: 40 tablet, Rfl: 0 .  albuterol (PROVENTIL HFA;VENTOLIN HFA) 108 (90 Base) MCG/ACT inhaler, Inhale 2 puffs into the lungs every 6 (six) hours as needed for wheezing., Disp: 1 Inhaler, Rfl: 0 .  amLODipine (NORVASC) 2.5 MG tablet, Take 1 tablet (2.5 mg total) by mouth daily., Disp: 30 tablet, Rfl: 11 .  aspirin 81 MG chewable tablet, Chew 81 mg by mouth daily. Start on 10/26/16 after completion of Xarelto, Disp: , Rfl:  .  fexofenadine-pseudoephedrine (ALLEGRA-D 24) 180-240 MG 24 hr tablet, Take 1 tablet by mouth daily., Disp: , Rfl:  .  furosemide (LASIX) 40 MG tablet, Take 2 tablets (80 mg total) by mouth daily., Disp: 90 tablet, Rfl: 3 .  hydroxychloroquine (PLAQUENIL) 200 MG tablet, Take 400 mg by mouth daily., Disp: , Rfl:  .  pantoprazole (PROTONIX) 40 MG tablet, Take 40 mg by mouth 2 (two) times daily., Disp: , Rfl:  .  predniSONE (DELTASONE) 5 MG tablet, Take 5 mg by mouth daily with breakfast. , Disp: , Rfl:  .  zolpidem (AMBIEN) 10 MG tablet, Take 10 mg by mouth at bedtime as needed for sleep., Disp: , Rfl:       Objective:   Vitals:   11/27/19 1154  BP: 132/70  Pulse: 86  SpO2: 100%  Weight: 204 lb (92.5 kg)  Height: _0  (1.6 m)    Estimated body mass index is 36.14 kg/m as calculated from the following:   Height as of this encounter: _1  (1.6 m).   Weight as of this encounter: 204 lb (92.5 kg).  _2 @  [employer]   11/27/19 1154  Weight: 204 lb (92.5 kg)     Physical Exam  General Appearance:    Alert, cooperative, no distress, appears stated age - yes , Deconditioned looking - no , OBESE  - yes, Sitting on Wheelchair -   no  Head:    Normocephalic, without obvious abnormality, atraumatic  Eyes:    PERRL, conjunctiva/corneas clear,  Ears:    Normal TM's and external ear canals, both ears  Nose:   Nares normal, septum midline, mucosa normal, no drainage    or sinus tenderness. OXYGEN  ON  - no . Patient is @ ra   Throat:   Lips, mucosa, and tongue normal; teeth and gums normal. Cyanosis on lips - no  Neck:   Supple, symmetrical, trachea midline, no adenopathy;    thyroid:  no enlargement/tenderness/nodules; no carotid   bruit or JVD  Back:     Symmetric, no curvature, ROM normal, no CVA tenderness  Lungs:     Distress - no , Wheeze no, Barrell Chest - no, Purse lip breathing - no, Crackles - yes velcro at base   Chest Wall:    No tenderness or deformity.    Heart:    Regular rate and rhythm, S1 and S2 normal, no rub   or gallop, Murmur - no  Breast Exam:    NOT DONE  Abdomen:     Soft, non-tender, bowel sounds active all four quadrants,    no masses, no organomegaly. Visceral obesity - yes  Genitalia:   NOT DONE  Rectal:   NOT DONE  Extremities:   Extremities - normal, Has Cane - no, Clubbing - no, Edema - no  Pulses:   2+ and symmetric all extremities  Skin:   Stigmata of Connective Tissue Disease - STIGMATA of CONNECTIVE TISSUE DISEASE  - Distal digital fissuring (ie, "mechanic hands") - no - Distal digital tip ulceration - no -Inflammatory arthritis or polyarticular morning joint stiffness ?60 minutes - yds - Palmar telangiectasia - no - Raynaud phenomenon - yes - Unexplained digital edema - mayb3 - Unexplained fixed rash on the digital extensor surfaces (Gottron's sign) - no ... - Deformities of RA - no - Scleroderma  - no - Malar Rash -  no   Lymph nodes:   Cervical, supraclavicular, and axillary nodes normal  Psychiatric:  Neurologic:   Pleasant - yes, Anxious - no, Flat affect - no  CAm-ICU - neg, Alert and Oriented x 3 - yes, Moves all 4s - yes, Speech - normal, Cognition - intact            Assessment:       ICD-10-CM   1. Interstitial lung disease due to connective tissue disease ([place])  J84.89    M35.9   2. Chronic sinus complaints  R09.89   3. Chronic cough  R05   4. Dyspnea on exertion  R06.00        Plan:     Patient Instructions     ICD-10-CM   1. Interstitial lung disease due to connective tissue disease ([place])  J84.89    M35.9   2. Chronic sinus complaints  R09.89   3. Chronic cough  R05   4. Dyspnea on exertion  R06.00     - need to restage and re-evaluate  Plan   -blood work for  cbc with diff, IgE, RF, CCP, ANA, SCL-70, ANCA screen, MPO, PR-3 - do CT sinus without contrast - next few weeks  - do HRCT supine and prone - next few weeks - do 2D echo - for LV function and pulmonary pressures - next few weeks  - hold off ono testing for now  - do spirometry and dlco - first availavble - Please talk to PCP Asencion Noble, MD -  and ensure you get  shingrix ([place]) inactivated vaccine against shingles - covid-19 vaccine when available but need to assess risk esp if there is allergy history  Followup  - 30 miin ILD clinic next 4-8 weeks (PFT need not happen before this visit)  - review  results and discuss possible role of anti-fibrotics and other measures to improve symptoms   > 50% of this > 25 min visit spent in  face to face counseling or coordination of care - by this undersigned MD - Dr Brand Males. This includes one or more of the following documented above: discussion of test results, diagnostic or treatment recommendations, prognosis, risks and benefits of management options, instructions, education, compliance or risk-factor reduction   SIGNATURE    Dr. Brand Males, M.D., F.C.C.P,  Pulmonary and Critical Care Medicine Staff Physician, [place] Director - Interstitial Lung Disease  Program  Pulmonary [facility] at [place], [place], [address]  Pager: [phone], If no answer or between  15:00h - 7:00h: call [phone] Telephone: [phone]  12:38 PM 11/27/2019

## 2019-11-27 NOTE — Telephone Encounter (Signed)
Pre-cert Verification for the following procedure    Echo scheduled for 12-19-2019 at [facility]

## 2019-11-27 NOTE — Patient Instructions (Signed)
ICD-10-CM   1. Interstitial lung disease due to connective tissue disease ([place])  J84.89    M35.9   2. Chronic sinus complaints  R09.89   3. Chronic cough  R05   4. Dyspnea on exertion  R06.00     - need to restage and re-evaluate  Plan   -blood work for  cbc with diff, IgE, RF, CCP, ANA, SCL-70, ANCA screen, MPO, PR-3 - do CT sinus without contrast - next few weeks  - do HRCT supine and prone - next few weeks - do 2D echo - for LV function and pulmonary pressures - next few weeks  - hold off ono testing for now  - do spirometry and dlco - first availavble - Please talk to PCP Asencion Noble, MD -  and ensure you get  shingrix ([place]) inactivated vaccine against shingles - covid-19 vaccine when available but need to assess risk esp if there is allergy history  Followup  - 30 miin ILD clinic next 4-8 weeks (PFT need not happen before this visit)  - review results and discuss possible role of anti-fibrotics and other measures to improve symptoms

## 2019-11-28 ENCOUNTER — Other Ambulatory Visit: Payer: Self-pay

## 2019-11-28 ENCOUNTER — Encounter: Payer: Self-pay | Admitting: Podiatry

## 2019-11-28 ENCOUNTER — Ambulatory Visit: Payer: Medicare HMO | Admitting: Podiatry

## 2019-11-28 DIAGNOSIS — Q828 Other specified congenital malformations of skin: Secondary | ICD-10-CM | POA: Diagnosis not present

## 2019-11-28 DIAGNOSIS — D689 Coagulation defect, unspecified: Secondary | ICD-10-CM | POA: Diagnosis not present

## 2019-11-28 NOTE — Progress Notes (Signed)
She presents today states that she saw Dr. Posey Pronto to trim her calluses down but she thinks that there is some callus left to be trimmed so she would like for me to take a look at it.  Objective: Vital signs are stable she is alert and oriented x3 reactive hyper keratomas minimally present subfifth bilaterally.  She has such severe atrophy of her fifth metatarsal fat pad that she is walking directly on the fifth metatarsal head.  This is going to eventually result in ulceration or further skin breakdown rather than reactive hyperkeratotic lesions.  Assessment: Plantarflexed fifth metatarsals fat pad atrophy or keratosis.  Plan: Discussed the possible need for orthoses or at the very least how to offload with an over-the-counter insole the fifth metatarsal by cutting it out at the head.  I explained this to her and she understands.  I also debrided any reactive hyperkeratotic tissue that was present.

## 2019-11-29 ENCOUNTER — Ambulatory Visit: Payer: Medicare HMO | Attending: Internal Medicine

## 2019-11-29 DIAGNOSIS — Z20822 Contact with and (suspected) exposure to covid-19: Secondary | ICD-10-CM

## 2019-11-29 DIAGNOSIS — Z20828 Contact with and (suspected) exposure to other viral communicable diseases: Secondary | ICD-10-CM | POA: Diagnosis not present

## 2019-11-30 LAB — RHEUMATOID FACTOR: Rheumatoid fact SerPl-aCnc: 17 IU/mL — ABNORMAL HIGH (ref <14)

## 2019-11-30 LAB — IGE: IgE (Immunoglobulin E), Serum: 45 kU/L (ref <=114)

## 2019-11-30 LAB — ANCA SCREEN W REFLEX TITER

## 2019-11-30 LAB — ANA: Anti Nuclear Antibody (ANA): POSITIVE — AB

## 2019-11-30 LAB — CYCLIC CITRUL PEPTIDE ANTIBODY, IGG: Cyclic Citrullin Peptide Ab: <16 UNITS

## 2019-11-30 LAB — ANTI-NUCLEAR AB-TITER (ANA TITER)
ANA TITER: 1:80 {titer} — ABNORMAL HIGH
ANA Titer 1: 1:80 {titer} — ABNORMAL HIGH

## 2019-11-30 LAB — ANTI-SCLERODERMA ANTIBODY: Scleroderma (Scl-70) (ENA) Antibody, IgG: >8 AI — AB

## 2019-11-30 LAB — NOVEL CORONAVIRUS, NAA: SARS-CoV-2, NAA: NOT DETECTED

## 2019-11-30 LAB — MPO/PR-3 (ANCA) ANTIBODIES
Myeloperoxidase Abs: <1 AI
Serine Protease 3: <1 AI

## 2019-12-19 ENCOUNTER — Ambulatory Visit (HOSPITAL_COMMUNITY)
Admission: RE | Admit: 2019-12-19 | Discharge: 2019-12-19 | Disposition: A | Discharge status: Home / Self Care | Payer: Medicare HMO | Source: Ambulatory Visit | Attending: Internal Medicine | Admitting: Internal Medicine

## 2019-12-19 ENCOUNTER — Ambulatory Visit (HOSPITAL_BASED_OUTPATIENT_CLINIC_OR_DEPARTMENT_OTHER)
Admission: RE | Admit: 2019-12-19 | Discharge: 2019-12-19 | Disposition: A | Discharge status: Home / Self Care | Payer: Medicare HMO | Source: Ambulatory Visit | Attending: Internal Medicine | Admitting: Internal Medicine

## 2019-12-19 ENCOUNTER — Other Ambulatory Visit (HOSPITAL_COMMUNITY): Payer: Medicare HMO

## 2019-12-19 ENCOUNTER — Other Ambulatory Visit: Payer: Self-pay

## 2019-12-19 DIAGNOSIS — J8489 Other specified interstitial pulmonary diseases: Secondary | ICD-10-CM | POA: Diagnosis not present

## 2019-12-19 DIAGNOSIS — J342 Deviated nasal septum: Secondary | ICD-10-CM | POA: Diagnosis not present

## 2019-12-19 DIAGNOSIS — R0602 Shortness of breath: Secondary | ICD-10-CM | POA: Diagnosis not present

## 2019-12-19 DIAGNOSIS — R05 Cough: Secondary | ICD-10-CM | POA: Insufficient documentation

## 2019-12-19 DIAGNOSIS — J849 Interstitial pulmonary disease, unspecified: Secondary | ICD-10-CM

## 2019-12-19 DIAGNOSIS — R0609 Other forms of dyspnea: Secondary | ICD-10-CM

## 2019-12-19 DIAGNOSIS — R059 Cough, unspecified: Secondary | ICD-10-CM

## 2019-12-19 DIAGNOSIS — M359 Systemic involvement of connective tissue, unspecified: Secondary | ICD-10-CM

## 2019-12-19 DIAGNOSIS — I110 Hypertensive heart disease with heart failure: Secondary | ICD-10-CM | POA: Diagnosis not present

## 2019-12-19 DIAGNOSIS — J8410 Pulmonary fibrosis, unspecified: Secondary | ICD-10-CM | POA: Diagnosis not present

## 2019-12-19 DIAGNOSIS — R0600 Dyspnea, unspecified: Secondary | ICD-10-CM | POA: Diagnosis not present

## 2019-12-19 DIAGNOSIS — K219 Gastro-esophageal reflux disease without esophagitis: Secondary | ICD-10-CM | POA: Insufficient documentation

## 2019-12-19 DIAGNOSIS — I509 Heart failure, unspecified: Secondary | ICD-10-CM | POA: Insufficient documentation

## 2019-12-19 NOTE — Progress Notes (Signed)
*  PRELIMINARY RESULTS* Echocardiogram 2D Echocardiogram has been performed.  [name] 12/19/2019, 12:38 PM

## 2019-12-26 ENCOUNTER — Encounter: Payer: Self-pay | Admitting: Internal Medicine

## 2019-12-26 ENCOUNTER — Ambulatory Visit: Payer: Medicare HMO | Admitting: Internal Medicine

## 2019-12-26 ENCOUNTER — Other Ambulatory Visit: Payer: Self-pay

## 2019-12-26 VITALS — BP 126/70 | HR 86 | Ht 63.0 in | Wt 207.6 lb

## 2019-12-26 DIAGNOSIS — M349 Systemic sclerosis, unspecified: Secondary | ICD-10-CM | POA: Diagnosis not present

## 2019-12-26 DIAGNOSIS — J8489 Other specified interstitial pulmonary diseases: Secondary | ICD-10-CM

## 2019-12-26 DIAGNOSIS — M359 Systemic involvement of connective tissue, unspecified: Secondary | ICD-10-CM | POA: Diagnosis not present

## 2019-12-26 DIAGNOSIS — R0609 Other forms of dyspnea: Secondary | ICD-10-CM

## 2019-12-26 DIAGNOSIS — R0989 Other specified symptoms and signs involving the circulatory and respiratory systems: Secondary | ICD-10-CM

## 2019-12-26 DIAGNOSIS — R0600 Dyspnea, unspecified: Secondary | ICD-10-CM

## 2019-12-26 MED ORDER — ALBUTEROL SULFATE HFA 108 (90 BASE) MCG/ACT IN AERS: 2.0000 | INHALATION_SPRAY | Freq: Four times a day (QID) | RESPIRATORY_TRACT | 3 refills | Status: DC | PRN

## 2019-12-26 NOTE — Progress Notes (Signed)
OV 11/27/2019  Subjective:  Patient ID: [name], female , DOB: Feb 21, 1946 , age 74 y.o. , MRN: [id number] , ADDRESS: [place] [id number]   11/27/2019 -   Chief Complaint  Patient presents with  . Follow-up    Former BQ pt. Pt states she does become SOB with activities and also at times when at rest. Pt also has an occ cough which is worse in the morning and will cough up clear phlegm.   Follow-up interstitial lung disease secondary to connective tissue disease [rheumatologist Tacey Heap, M.D., M.H.S. at [facility] but now Dr Richardson Landry at  [facility])  ILD: Dr Joya Gaskins -> Dr Lake Bells -> DR Chase Caller at ILD center [place] ,  HPI [name] 74 y.o. -presents to the ILD clinic for evaluation of her interstitial lung disease secondary to connective tissue disease (rheumatoid factor 90, ANA 1: 640, ANA second titer 1: 2000 560, positive SCL 70 August 2012 at [facility] and positive [place].  I am meeting her for the first time.  Her rheumatology situation is managed by Dr. Richardson Landry at [place].  She no longer follows at [facility] after the doctor they relocated to [place].  She is maintained on 5 mg prednisone per day.  She is not on any other immunomodulators.  Her last TNF alpha blockade was over 3 years ago.  She is known to have interstitial lung disease.  Last staged in March 2019.  She tells me that since then she continues to have significant amount of cough and shortness of breath.  She believes the shortness of breath is slightly worse.  She thinks her pulmonary issues itself is stable.  She thinks the worsening shortness of breath and the persistent shortness of breath could be due to cardiac issues.  In 2015 she had a right heart catheterization evaluation that was normal.  Last echo was in 2015.  No further work-up since then.  Dyspnea symptom score is listed below.  She in addition she also has significant amount of  cough that she attributes to sinus drainage.  She is not had a CT scan of the sinus.  She says she is allergic to a few things but last tested several years ago.  She does not remember when.  She has seen ENT many years ago does not know the details.  At this point in time she would love to have her symptoms better managed.  IMPRESSION: HRCT Lungs/Pleura: Subpleural reticulation and traction bronchiectasis/bronchiolectasis are again seen without a definite zonal predominance. Findings are worst in the right middle lobe. Pattern and distribution appear stable from prior exams. No pleural fluid. Airway is unremarkable. No air trapping. 1. Pulmonary parenchymal pattern of fibrosis is stable from prior exams and likely due to fibrotic nonspecific interstitial pneumonitis. 2. Cirrhosis, steatosis. 3. Aortic atherosclerosis (ICD10-170.0). Coronary artery calcification.   Electronically Signed   By: Lorin Picket M.D.   On: 03/17/2018 09:09  ROS - per HPI    OV 12/26/2019  Subjective:  Patient ID: [name], female , DOB: June 25, 1946 , age 74 y.o. , MRN: [id number] , ADDRESS: [address] [place] [address]   12/26/2019 -   Chief Complaint  Patient presents with  . Follow-up    Pt states she has been doing well since last visit and denies any complaints.   Follow-up interstitial lung disease secondary to connective tissue disease  [rheumatologist previously - Beatriz Stallion, M.D., M.H.S.  at [facility] but now Dr Richardson Landry at  [facility])  - per records - ILD since 2009  - systemic sclerosis/RA overlap per [facility] notes  = Telangiectasias, Raynaud's, esophageal dysmotility - ? calcinosis on right heel. Per [facility] notes  - Serology   - 2012 at [place]: (rheumatoid factor 90, ANA 1: 640, ANA second titer 1: 2000 560, positive SCL 70 August 2012).   -  [facility]: RF + (low titer), ANA + (low titer), SCl 70 +  (in 2021 Jan [facility])  - ILD: Dr Joya Gaskins -> Dr Lake Bells -> DR Chase Caller at ILD center  [place]   Rx History - all [place] initiated 12/2011 d/c due to lack of control of arthrits D. MTX initiated 04/2012 -discontinued due to concerns since patient has underlying lung disease   D- CHF - 2015 [facility]   - She had a right heart cath in April 2015 which showed the following:  Hemodynamic Findings:  PA: 43/18 (mean 31)  PCWP: 25  Fick Cardiac Index: 3.45 L/min/m2    xxxxxxxxxxxxxxxxxxxxxxxxxxxxxxxxxxxxxxxxxxxxxxxxxxxxxx HPI [name] 74 y.o. -returns for follow-up to discuss her test results.  She has interstitial lung disease secondary to connective tissue disease.  She has not been able to do her pulmonary function test because of the COVID-19 pandemic and the disruptions.  It is scheduled in the next few to several weeks.  She did have repeat serology and the overwhelmingly strong antibodies to scleroderma antibody.  The rheumatoid factor and ANA weakly positive only.  Her high-resolution CT chest scan of the chest according to thoracic radiology that I personally visualized shows stable pattern compared to 2013.  This is somewhat discrepant with the pulmonary function test that is shown decline and the symptoms are showing decline.  Of course the upcoming pulmonary function test is not available.  Nevertheless it is reassuring that the CT scan is stable.  We discussed her symptoms and it appears she does have diastolic dysfunction is based on the echo.  Clinically she is euvolemic.  She is also physically deconditioned and the BMI is high.  Her other big symptom burden is a cough secondary to significant postnasal drainage.  The CT scan of the sinus itself is normal.  She does have some deviated nasal septum.  Her blood IgE is normal. ANCA is normal   SYMPTOM SCALE - ILD 12/26/2019   O2 use ra  Shortness of Breath 0 -> 5 scale with 5 being worst (score 6 If unable to do)  At rest 0  Simple tasks - showers, clothes change, eating, shaving 3  Household (dishes, doing  bed, laundry) 3  Shopping 3  Walking level at own pace 3  Walking up Stairs 2  Total (30-36) Dyspnea Score 14  How bad is your cough? 3  How bad is your fatigue 3  How bad is nausea 3  How bad is vomiting?  0  How bad is diarrhea? 3  How bad is anxiety? 3  How bad is depression 3    Serology Jan 2021  Results for [name], [name] (MRN [id number]) as of 12/26/2019 11:49  Ref. Range 11/27/2019 12:43  Anti Nuclear Antibody (ANA) Latest Ref Range: NEGATIVE  POSITIVE (A)  ANA Pattern 1 Unknown Nuclear, Speckled (A)  ANA Titer 1 Latest Units: titer 0:94 (H)  Cyclic Citrullin Peptide Ab Latest Units: UNITS <16  Myeloperoxidase Abs Latest Units: AI <1.0  Serine Protease 3 Latest Units: AI <1.0  RA Latex Turbid. Latest  Ref Range: <14 IU/mL 17 (H)     IMPRESSIONS - echo jan 2021    1. Left ventricular ejection fraction, by visual estimation, is 60 to 65%. The left ventricle has normal function. There is moderately increased left ventricular hypertrophy.  2. Left ventricular diastolic parameters are consistent with Grade I diastolic dysfunction (impaired relaxation).  3. The left ventricle has no regional wall motion abnormalities.  4. Global right ventricle has normal systolic function.The right ventricular size is normal. No increase in right ventricular wall thickness.  5. Left atrial size was mildly dilated.  6. Right atrial size was normal.  7. The mitral valve is normal in structure. No evidence of mitral valve regurgitation. No evidence of mitral stenosis.  8. The tricuspid valve is normal in structure.  9. The tricuspid valve is normal in structure. Tricuspid valve regurgitation is not demonstrated. 10. The aortic valve is tricuspid. Aortic valve regurgitation is not visualized. No evidence of aortic valve sclerosis or stenosis. 11. The pulmonic valve was not well visualized. Pulmonic valve regurgitation is not visualized. 12. The inferior vena cava is normal in size with  greater than 50% respiratory variability, suggesting right atrial pressure of 3 mmHg.    Results for [name], [name] (MRN [id number]) as of 11/27/2019 12:15  Ref. Range 09/05/2013 15:00 03/07/2014 12:09 02/07/2015 10:34 09/22/2016 15:33 02/25/2018 14:59  FVC-Pre Latest Units: L ? 2.03 2.04 1.97 1.94 1.75  FVC-%Pred-Pre Latest Units: % 68 70 68 68 60   Results for [name], [name] (MRN [id number]) as of 11/27/2019 12:15  Ref. Range 09/05/2013 15:00 03/07/2014 12:09 02/07/2015 10:34 09/22/2016 15:33 02/25/2018 14:59  DLCO unc Latest Units: ml/min/mmHg 14.81 14.71 15.60 15.97 16.44  DLCO unc % pred Latest Units: % 68 68 72 74 71    IMPRESSION: HRCT JAn 2021 1. There is a spectrum of findings in the lungs compatible with interstitial lung disease, which appear stable compared to the prior examination. This is technically characterized as compatible with usual interstitial pneumonia (UIP) per current ATS guidelines. However, given the stability dating back to at least 2013 and the overall appearance, this is again favored to reflect fibrotic phase nonspecific interstitial pneumonia (NSIP). 2. Aortic atherosclerosis, in addition to left main and 3 vessel coronary artery disease. Assessment for potential risk factor modification, dietary therapy or pharmacologic therapy may be warranted, if clinically indicated. 3. Hepatic steatosis.  Aortic Atherosclerosis (ICD10-I70.0).   Electronically Signed   By: Vinnie Langton M.D.   On: 12/19/2019 14:33  CT SINUS JAn 2021  EXAM: CT PARANASAL SINUS LIMITED WITHOUT CONTRAST  TECHNIQUE: Non-contiguous multidetector CT images of the paranasal sinuses were obtained in a single plane without contrast.  COMPARISON:  None.  FINDINGS: The included portions of the paranasal sinuses are clear without evidence of significant mucosal thickening or fluid. Rightward nasal septal deviation measures 3 mm. The regional soft tissues are grossly  unremarkable.  IMPRESSION: Clear paranasal sinuses.   Electronically Signed   By: Logan Bores M.D.   On: 12/19/2019 14:15   ECHO JAn 2021  IMPRESSIONS    1. Left ventricular ejection fraction, by visual estimation, is 60 to 65%. The left ventricle has normal function. There is moderately increased left ventricular hypertrophy.  2. Left ventricular diastolic parameters are consistent with Grade I diastolic dysfunction (impaired relaxation).  3. The left ventricle has no regional wall motion abnormalities.  4. Global right ventricle has normal systolic function.The right ventricular size is normal. No increase in right ventricular wall  thickness.  5. Left atrial size was mildly dilated.  6. Right atrial size was normal.  7. The mitral valve is normal in structure. No evidence of mitral valve regurgitation. No evidence of mitral stenosis.  8. The tricuspid valve is normal in structure.  9. The tricuspid valve is normal in structure. Tricuspid valve regurgitation is not demonstrated. 10. The aortic valve is tricuspid. Aortic valve regurgitation is not visualized. No evidence of aortic valve sclerosis or stenosis. 11. The pulmonic valve was not well visualized. Pulmonic valve regurgitation is not visualized. 12. The inferior vena cava is normal in size with greater than 50% respiratory variability, suggesting right atrial pressure of 3 mmHg.   ROS - per HPI     has a past medical history of Diastolic congestive heart failure ([place]), GERD (gastroesophageal reflux disease), History of blood transfusion, cholecystectomy, Interstitial lung disease ([place]), Osteoarthritis, Pulmonary fibrosis ([place]), Raynaud disease, Rheumatoid arthritis(714.0), Scleroderma ([place]), Seasonal allergies, Shortness of breath dyspnea, and Systemic sclerosis ([place]).   reports that she quit smoking about 31 years ago. Her smoking use included cigarettes. She has a 20.00 pack-year smoking history. She has never  used smokeless tobacco.  Past Surgical History:  Procedure Laterality Date  . ABDOMINAL HYSTERECTOMY    . BLADDER SUSPENSION    . CARPAL TUNNEL RELEASE    . LAPAROSCOPIC CHOLECYSTECTOMY    . LEFT HEART CATHETERIZATION WITH CORONARY/GRAFT ANGIOGRAM  03/15/2014   Procedure: LEFT HEART CATHETERIZATION WITH Beatrix Fetters;  Surgeon: Burnell Blanks, MD;  Location: [facility] CATH LAB;  Service: Cardiovascular;;  . TONSILLECTOMY    . TOTAL KNEE ARTHROPLASTY Left 02/11/2015   Procedure: LEFT TOTAL KNEE ARTHROPLASTY, RIGHT KNEE CORTISONE INJECTION;  Surgeon: Gaynelle Arabian, MD;  Location: [facility] ORS;  Service: Orthopedics;  Laterality: Left;  . TOTAL KNEE ARTHROPLASTY Right 10/05/2016   Procedure: RIGHT TOTAL KNEE ARTHROPLASTY;  Surgeon: Gaynelle Arabian, MD;  Location: [facility] ORS;  Service: Orthopedics;  Laterality: Right;  Marland Kitchen VESICOVAGINAL FISTULA CLOSURE W/ TAH  1987    Allergies  Allergen Reactions  . Codeine     Neurological problems - hallucinations, "spacey"    Immunization History  Administered Date(s) Administered  . Influenza Split 08/01/2011, 09/02/2012, 08/30/2013, 08/29/2016  . Influenza, High Dose Seasonal PF 08/28/2017  . Influenza,inj,Quad PF,6+ Mos 09/03/2014  . Influenza-Unspecified 08/14/2014, 08/21/2015  . Moderna SARS-COVID-2 Vaccination 12/21/2018  . Pneumococcal Conjugate-13 09/19/2014, 11/28/2014  . Pneumococcal Polysaccharide-23 11/30/2008, 08/30/2009  . Zoster 11/30/2008    Family History  Problem Relation Age of Onset  . Stroke Mother   . Heart disease Mother        CABG at age 42  . Heart attack Father        Died of MI at age 15  . Emphysema Father   . Dementia Brother   . Osteoarthritis Unknown   . Celiac disease Unknown      Current Outpatient Medications:  .  acetaminophen (TYLENOL) 325 MG tablet, Take 2 tablets (650 mg total) by mouth every 6 (six) hours as needed for mild pain (or Fever >/= 101)., Disp: 40 tablet, Rfl: 0 .  albuterol (VENTOLIN  HFA) 108 (90 Base) MCG/ACT inhaler, Inhale 2 puffs into the lungs every 6 (six) hours as needed for wheezing., Disp: 18 g, Rfl: 3 .  amLODipine (NORVASC) 2.5 MG tablet, Take 1 tablet (2.5 mg total) by mouth daily., Disp: 30 tablet, Rfl: 11 .  aspirin 81 MG chewable tablet, Chew 81 mg by mouth daily. Start on 10/26/16 after completion  of Xarelto, Disp: , Rfl:  .  fexofenadine-pseudoephedrine (ALLEGRA-D 24) 180-240 MG 24 hr tablet, Take 1 tablet by mouth daily., Disp: , Rfl:  .  furosemide (LASIX) 40 MG tablet, Take 2 tablets (80 mg total) by mouth daily., Disp: 90 tablet, Rfl: 3 .  hydroxychloroquine (PLAQUENIL) 200 MG tablet, Take 400 mg by mouth daily., Disp: , Rfl:  .  pantoprazole (PROTONIX) 40 MG tablet, Take 40 mg by mouth 2 (two) times daily., Disp: , Rfl:  .  predniSONE (DELTASONE) 5 MG tablet, Take 5 mg by mouth daily with breakfast. , Disp: , Rfl:  .  zolpidem (AMBIEN) 10 MG tablet, Take 10 mg by mouth at bedtime as needed for sleep., Disp: , Rfl:       Objective:   Vitals:   12/26/19 1124  BP: 126/70  Pulse: 86  SpO2: 100%  Weight: 207 lb 9.6 oz (94.2 kg)  Height: 5' 3" (1.6 m)    Estimated body mass index is 36.77 kg/m as calculated from the following:   Height as of this encounter: 5' 3" (1.6 m).   Weight as of this encounter: 207 lb 9.6 oz (94.2 kg).  _0 @  [employer]   12/26/19 1124  Weight: 207 lb 9.6 oz (94.2 kg)     Physical Exam disucssion only visit -but she is a euvolemic obese female who is alert and oriented x3 with speech normal          Assessment:       ICD-10-CM   1. Interstitial lung disease due to connective tissue disease ([place])  J84.89    M35.9   2. Scleroderma ([place])  M34.9   3. Chronic sinus complaints  R09.89   4. Dyspnea on exertion  R06.00    Based on inbuild data 1 could make a case for nintedanib in the presence of ILD but her ILD is stable on CT scan for many years.  Therefore probably the best approach is to  monitor her ILD and commit her to antifibrotic's if there is worsening.  Meanwhile it is probably best to use this time to address her shortness of breath and her cough.  We will start with pulmonary rehabilitation.  Certainly if this is not helping then we could consider a right heart catheterization to reevaluate for pulmonary hypertension.  This upcoming data of benefit from inhaled treprostinil in the setting of ILD associated pulmonary hypertension [group 3].    Plan:     Patient Instructions  Interstitial lung disease due to connective tissue disease ([place]) Scleroderma ([place]) with RA overalap  - pulmonary fibrosis stable on CT 2013 -> 2021  Plan - continue immune Rx per rheumatology  - awwat PFT test in feb 2021  - return in 3 months for ILD clinci followup   - if PFT showing significant decline or trend towards decline can discuss anti-fibrotic  - at this point want to hold off on antifibrotic given CT stability but focus more on symptom control  Shortness of breath   - multifactorial - fibrosis, weight, diastolic dysfunction, fitness all weighing  Plan  - refer pulmonary rehab  - depending on course might need right heart cath esp if inhaled tyvaso gets approved for this indication of pulmonary hypertension (summer 2021 maybe) -- continue lasix as before   Chronic sinus complaints  - IgE and vasculitis panel negative  Plan  - refer DR Blenda Nicely or Dr Elgie Congo  Followup 3 months ILD clinic or any day 30 min slot  SIGNATURE    Dr. Brand Males, M.D., F.C.C.P,  Pulmonary and Critical Care Medicine Staff Physician, [place] Director - Interstitial Lung Disease  Program  Pulmonary [place] [facility] [place], [place], [address]  Pager: [phone], If no answer or between  15:00h - 7:00h: call [phone] Telephone: [phone]  12:18 PM 12/26/2019

## 2019-12-26 NOTE — Patient Instructions (Addendum)
Interstitial lung disease due to connective tissue disease ([place]) Scleroderma ([place]) with RA overalap  - pulmonary fibrosis stable on CT 2013 -> 2021  Plan - continue immune Rx per rheumatology  - awwat PFT test in feb 2021  - return in 3 months for ILD clinci followup   - if PFT showing significant decline or trend towards decline can discuss anti-fibrotic   - at this point want to hold off on anti fibrotic given CT stability but focus more on symptom control than anti-fibrotic that can add to symptom distress  Shortness of breath   - multifactorial - fibrosis, weight, diastolic dysfunction, fitness all weighing  Plan  - refer pulmonary rehab  - depending on course might need right heart cath esp if inhaled tyvaso gets approved for this indication of pulmonary hypertension (summer 2021 maybe) -- continue lasix as before   Chronic sinus complaints  - IgE and vasculitis panel negative  Plan  - refer DR Blenda Nicely or Dr Elgie Congo  Followup 3 months ILD clinic or any day 30 min slot

## 2019-12-27 ENCOUNTER — Telehealth (HOSPITAL_COMMUNITY): Payer: Self-pay | Admitting: *Deleted

## 2019-12-27 NOTE — Telephone Encounter (Signed)
Received referral from Dr. Chase Caller for this pt to participate in Pulmonary rehab. Called and left message requesting call back.  Noted that [place] lives in [name]  Will give pt the option to participate either here at [facility] or [facility] in [place]. Cherre Huger, BSN Cardiac and Training and development officer

## 2019-12-27 NOTE — Telephone Encounter (Signed)
Pt returned call.  Advised pt on where we are as a health system with scheduling and there is a pulmonary rehab program at [facility].  Pt aware that presently we are on hold and once permitted to return to onsite she will be contacted.  Talked with pt on purse lip breathing techniques and diaphragmatic breathing. Will notify [place] pulmonary - Dr. Chase Caller pt preference to participate at [facility]. Cherre Huger, BSN Cardiac and Training and development officer

## 2020-01-12 ENCOUNTER — Other Ambulatory Visit (HOSPITAL_COMMUNITY)
Admission: RE | Admit: 2020-01-12 | Discharge: 2020-01-12 | Disposition: A | Discharge status: Home / Self Care | Payer: Medicare HMO | Source: Ambulatory Visit | Attending: Internal Medicine | Admitting: Internal Medicine

## 2020-01-12 ENCOUNTER — Other Ambulatory Visit: Payer: Self-pay

## 2020-01-19 ENCOUNTER — Encounter (HOSPITAL_COMMUNITY): Payer: Medicare HMO

## 2020-01-23 ENCOUNTER — Other Ambulatory Visit: Payer: Self-pay

## 2020-01-23 ENCOUNTER — Other Ambulatory Visit (HOSPITAL_COMMUNITY)
Admission: RE | Admit: 2020-01-23 | Discharge: 2020-01-23 | Disposition: A | Discharge status: Home / Self Care | Payer: Medicare HMO | Source: Ambulatory Visit | Attending: Internal Medicine | Admitting: Internal Medicine

## 2020-01-23 DIAGNOSIS — M545 Low back pain: Secondary | ICD-10-CM | POA: Diagnosis not present

## 2020-01-23 DIAGNOSIS — Z20822 Contact with and (suspected) exposure to covid-19: Secondary | ICD-10-CM | POA: Diagnosis not present

## 2020-01-23 DIAGNOSIS — M48061 Spinal stenosis, lumbar region without neurogenic claudication: Secondary | ICD-10-CM | POA: Diagnosis not present

## 2020-01-23 DIAGNOSIS — M5136 Other intervertebral disc degeneration, lumbar region: Secondary | ICD-10-CM | POA: Diagnosis not present

## 2020-01-23 DIAGNOSIS — Z01812 Encounter for preprocedural laboratory examination: Secondary | ICD-10-CM | POA: Diagnosis not present

## 2020-01-23 LAB — SARS CORONAVIRUS 2 (TAT 6-24 HRS): SARS Coronavirus 2: NEGATIVE

## 2020-01-30 DIAGNOSIS — J342 Deviated nasal septum: Secondary | ICD-10-CM | POA: Diagnosis not present

## 2020-01-30 DIAGNOSIS — J343 Hypertrophy of nasal turbinates: Secondary | ICD-10-CM | POA: Diagnosis not present

## 2020-01-30 DIAGNOSIS — R0982 Postnasal drip: Secondary | ICD-10-CM | POA: Diagnosis not present

## 2020-01-30 DIAGNOSIS — J310 Chronic rhinitis: Secondary | ICD-10-CM | POA: Diagnosis not present

## 2020-02-06 DIAGNOSIS — M5416 Radiculopathy, lumbar region: Secondary | ICD-10-CM | POA: Diagnosis not present

## 2020-02-08 ENCOUNTER — Encounter (HOSPITAL_COMMUNITY): Payer: Medicare HMO

## 2020-02-09 DIAGNOSIS — M4310 Spondylolisthesis, site unspecified: Secondary | ICD-10-CM | POA: Diagnosis not present

## 2020-02-09 DIAGNOSIS — M810 Age-related osteoporosis without current pathological fracture: Secondary | ICD-10-CM | POA: Diagnosis not present

## 2020-02-09 DIAGNOSIS — M48061 Spinal stenosis, lumbar region without neurogenic claudication: Secondary | ICD-10-CM | POA: Diagnosis not present

## 2020-02-09 DIAGNOSIS — M545 Low back pain: Secondary | ICD-10-CM | POA: Diagnosis not present

## 2020-02-09 DIAGNOSIS — S32000D Wedge compression fracture of unspecified lumbar vertebra, subsequent encounter for fracture with routine healing: Secondary | ICD-10-CM | POA: Diagnosis not present

## 2020-02-09 DIAGNOSIS — M069 Rheumatoid arthritis, unspecified: Secondary | ICD-10-CM | POA: Diagnosis not present

## 2020-02-16 DIAGNOSIS — M340 Progressive systemic sclerosis: Secondary | ICD-10-CM | POA: Diagnosis not present

## 2020-02-16 DIAGNOSIS — M0579 Rheumatoid arthritis with rheumatoid factor of multiple sites without organ or systems involvement: Secondary | ICD-10-CM | POA: Diagnosis not present

## 2020-03-01 DIAGNOSIS — S32000D Wedge compression fracture of unspecified lumbar vertebra, subsequent encounter for fracture with routine healing: Secondary | ICD-10-CM | POA: Diagnosis not present

## 2020-03-01 DIAGNOSIS — M48061 Spinal stenosis, lumbar region without neurogenic claudication: Secondary | ICD-10-CM | POA: Diagnosis not present

## 2020-03-04 ENCOUNTER — Other Ambulatory Visit: Payer: Self-pay

## 2020-03-04 ENCOUNTER — Ambulatory Visit (HOSPITAL_COMMUNITY): Payer: Medicare HMO | Attending: Orthopedic Surgery | Admitting: Physical Therapy

## 2020-03-04 DIAGNOSIS — G8929 Other chronic pain: Secondary | ICD-10-CM

## 2020-03-04 DIAGNOSIS — M6281 Muscle weakness (generalized): Secondary | ICD-10-CM | POA: Diagnosis not present

## 2020-03-04 DIAGNOSIS — R262 Difficulty in walking, not elsewhere classified: Secondary | ICD-10-CM | POA: Diagnosis not present

## 2020-03-04 DIAGNOSIS — M545 Low back pain, unspecified: Secondary | ICD-10-CM

## 2020-03-04 NOTE — Therapy (Signed)
[place], [place], [address] Phone: [phone]   Fax:  [phone]  Physical Therapy Evaluation  Patient Details  Name: [name] MRN: [id number] Date of Birth: [birth year] Referring Provider (PT): Lacie Draft, [place]   Encounter Date: 03/04/2020  PT End of Session - 03/04/20 1400    Visit Number  1    Number of Visits  12    Date for PT Re-Evaluation  04/29/20    Authorization Type  [employer] HMO. Auth required, No VL, Co-pay $40    Authorization Time Period  POC dates 03/04/20 to 04/29/20    Progress Note Due on Visit  10    PT Start Time  1400    PT Stop Time  1445    PT Time Calculation (min)  45 min    Equipment Utilized During Treatment  Back brace    Activity Tolerance  Patient tolerated treatment well    Behavior During Therapy  WFL for tasks assessed/performed       Past Medical History:  Diagnosis Date  . Diastolic congestive heart failure ([place])    02/05/15  states no fluid in extremities, breathing same as has been  . GERD (gastroesophageal reflux disease)   . History of blood transfusion   . Hx of cholecystectomy   . Interstitial lung disease ([place])   . Osteoarthritis   . Pulmonary fibrosis ([place])   . Raynaud disease   . Rheumatoid arthritis(714.0)   . Scleroderma ([place])   . Seasonal allergies   . Shortness of breath dyspnea    states normal for her  . Systemic sclerosis [facility])     Past Surgical History:  Procedure Laterality Date  . ABDOMINAL HYSTERECTOMY    . BLADDER SUSPENSION    . CARPAL TUNNEL RELEASE    . LAPAROSCOPIC CHOLECYSTECTOMY    . LEFT HEART CATHETERIZATION WITH CORONARY/GRAFT ANGIOGRAM  03/15/2014   Procedure: LEFT HEART CATHETERIZATION WITH Beatrix Fetters;  Surgeon: Burnell Blanks, MD;  Location: [facility] CATH LAB;  Service: Cardiovascular;;  . TONSILLECTOMY    . TOTAL KNEE ARTHROPLASTY Left 02/11/2015   Procedure: LEFT TOTAL KNEE ARTHROPLASTY, RIGHT  KNEE CORTISONE INJECTION;  Surgeon: Gaynelle Arabian, MD;  Location: [facility];  Service: Orthopedics;  Laterality: Left;  . TOTAL KNEE ARTHROPLASTY Right 10/05/2016   Procedure: RIGHT TOTAL KNEE ARTHROPLASTY;  Surgeon: Gaynelle Arabian, MD;  Location: [facility];  Service: Orthopedics;  Laterality: Right;  Marland Kitchen VESICOVAGINAL FISTULA CLOSURE W/ TAH  1987    There were no vitals filed for this visit.   Subjective Assessment - 03/04/20 1554    Subjective  Patient complains of low back pain that started about 6 weeks ago. States that she has a large older dog and her husband was getting them into the car for the vet and it was bothering his back (he has a history of 3 back surgeries) so she tried to get them in back to the car from the vet's office. States that she felt a pull but had not pain. Reports that she then noticed a little pain later that day and then it progressively worsened. It finally got to the point where she could barely get out of bed because of the pain so she sought out medical help. Imaging showed that she had a compression fracture at L4. She was given a brace and instructed to wear it while out of bed. Brace is very uncomfortable and increases difficulty of breathing, which is  already a challenge for her because of her congestive heart failure. States that since the injury her pain has improved. Initially it was at the top of her hips and radiating down both legs into he groin, now it is mainly just across the top but it has been worse on the right side as of late.    Pertinent History  CHF, shortness of breath, history of bilateral TKA    Limitations  Lifting;Standing;Walking;House hold activities    Diagnostic tests  MRI - compression fracture L4    Patient Stated Goals  to be able to walk and roll over without poain    Currently in Pain?  Yes    Pain Score  2     Pain Location  Knee    Pain Orientation  Lower    Pain Descriptors / Indicators  Aching    Pain Type  Acute pain    Pain  Radiating Towards  to hips and into legs    Pain Onset  More than a month ago    Pain Frequency  Constant    Aggravating Factors   walking, rolling over in bed    Pain Relieving Factors  pain meds, laying down, brace         [facility] PT Assessment - 03/04/20 0001      Assessment   Medical Diagnosis  wedge compression fracture - L4-     Referring Provider (PT)  Lacie Draft, PA    Prior Therapy  yes s/p TKA      Precautions   Precautions  Back    Required Braces or Orthoses  Spinal Brace   as needed for lumbar fracture     Restrictions   Weight Bearing Restrictions  No      Balance Screen   Has the patient fallen in the past 6 months  No    How many times?  0    Has the patient had a decrease in activity level because of a fear of falling?   Yes    Is the patient reluctant to leave their home because of a fear of falling?   No      Home Environment   Living Environment  Private residence    Available Help at Discharge  Family    Type of [place] to enter    Entrance Stairs-Number of Steps  5    [place]  Two level      Prior Function   Level of [place]  Retired   was a full time Visual merchandiser   Overall Cognitive Status  Within Functional Limits for tasks assessed      Observation/Other Assessments   Observations  wearing spinal brace    Focus on Therapeutic Outcomes (FOTO)   57% limited      ROM / Strength   AROM / PROM / Strength  AROM;Strength      AROM   Overall AROM Comments  WNL of hip ROM - L hip IR increased back pain     AROM Assessment Site  Lumbar      Strength   Strength Assessment Site  Hip    Right/Left Hip  Right;Left    Right Hip Extension  4-/5    Left Hip Extension  3+/5   pain in low back      Palpation   Spinal mobility  hypomobility noted  throughout lumbar spine    Palpation comment  tenderness to palpation in bilateral QL and glute meds, R lumbar paraspinals.  Increased resting tone noted in right lower trunk musculature and right glutes/hip muscles      Special Tests    Special Tests  Hip Special Tests    Other special tests  hamstring length 90/90 full Bilaterally;     Hip Special Tests   Ely's Test      Ely's Test   Findings  Positive    Side  Right;Left    Comments  90 of knee flexion B and hip hike B - pain on L with movement initially      Bed Mobility   Bed Mobility  Rolling Right;Rolling Left;Right Sidelying to Sit;Supine to Sit   pain with bed mobilities     Ambulation/Gait   Ambulation/Gait  Yes    Ambulation/Gait Assistance  6: Modified independent (Device/Increase time)    Ambulation Distance (Feet)  320 Feet    Assistive device  None    Gait Pattern  Decreased arm swing - right;Decreased arm swing - left;Step-through pattern    Ambulation Surface  Level;Indoor    Gait velocity  decreased    Gait Comments  2MW                Objective measurements completed on examination: See above findings.      [place] Adult PT Treatment/Exercise - 03/04/20 0001      Exercises   Exercises  Lumbar      Lumbar Exercises: Prone   Other Prone Lumbar Exercises  glute sets 2x5, 5" holds     Other Prone Lumbar Exercises  hamstring curl - 2x5 10" holds B             PT Education - 03/04/20 1620    Education Details  on current condition and findings, MOI and how symptoms more consistent with SIJ issue/ muscle strain. on HEP    Person(s) Educated  Patient    Methods  Explanation    Comprehension  Verbalized understanding       PT Short Term Goals - 03/04/20 1546      PT SHORT TERM GOAL #1   Title  Patient will be able to roll over in bed without an increase in pain to demonstrate improved bed mobilities.    Time  4    Period  Weeks    Status  New    Target Date  04/01/20      PT SHORT TERM GOAL #2   Title  Patient will be independent in self management strategies to improve quality of life and functional  outcomes.    Time  4    Period  Weeks    Status  New    Target Date  04/01/20      PT SHORT TERM GOAL #3   Title  Patient will report at least 25% improvement in overall symptoms and/or functional ability    Time  4    Period  Weeks    Status  New    Target Date  04/01/20        PT Long Term Goals - 03/04/20 1548      PT LONG TERM GOAL #1   Title  Patient will report at least 50% improvement in overall symptoms and/or functional ability    Time  8    Period  Weeks    Status  New    Target Date  04/29/20      PT LONG TERM GOAL #2   Title  Patient will score with < 45% limitation on FOTO to demonstrate improved functional mobility.    Time  8    Period  Weeks    Status  New    Target Date  04/29/20      PT LONG TERM GOAL #3   Title  Patient will be able to stand and ambulate for at least 10 minutes without lumbar brace to demonstrate improved functional mobility.    Time  8    Period  Weeks    Status  New    Target Date  04/29/20             Plan - 03/04/20 1550    Clinical Impression Statement  Patient complains of low back pain that radiates into her legs that started about 6 weeks ago after lifting her dog. Compression fracture found in lumbar spine with imaging, but pain and symptoms today present more like muscle strain/sacroiliac joint dysfunction.  Educated patient in current findings, will continue to investigate sacroiliac joint in future sessions. Patient would benefit from skilled physical therapy to improve functional mobility and strength and to decrease overall symptoms.    Personal Factors and Comorbidities  Comorbidity 1;Comorbidity 2    Comorbidities  CHF, shortness of breath, bilateral TKA (2017 and 2016)    Examination-Activity Limitations  Sit;Bed Mobility;Sleep;Squat;Stairs;Stand;Transfers;Locomotion Level    Examination-Participation Restrictions  Cleaning;Meal Prep;Shop    Stability/Clinical Decision Making  Evolving/Moderate complexity     Clinical Decision Making  Moderate    Rehab Potential  Good    PT Frequency  Other (comment)   1-2x/week for 8 weeks for max total of 12 visits   PT Duration  8 weeks    PT Treatment/Interventions  ADLs/Self Care Home Management;Aquatic Therapy;Biofeedback;Cryotherapy;Electrical Stimulation;Moist Heat;Traction;Balance training;Therapeutic exercise;Therapeutic activities;Functional mobility training;Stair training;Gait training;Neuromuscular re-education;DME Instruction;Patient/family education;Manual techniques;Dry needling;Passive range of motion;Spinal Manipulations    PT Next Visit Plan  SIJ assessment, STM/joint mobilizations to sacrum and AP of lumbar spine, psoas STM, pelvic mobility and core/glute strengthening. MET as needed with SIJ assessment    PT Home Exercise Plan  4/5 hamstring curls, quad stretch, glute isometric       Patient will benefit from skilled therapeutic intervention in order to improve the following deficits and impairments:  Pain, Decreased strength, Decreased mobility, Difficulty walking, Decreased range of motion, Decreased activity tolerance  Visit Diagnosis: Chronic midline low back pain without sciatica  Muscle weakness (generalized)  Difficulty in walking, not elsewhere classified     Problem List Patient Active Problem List   Diagnosis Date Noted  . Fracture of metacarpal bone 07/07/2019  . DDD (degenerative disc disease), cervical 11/29/2018  . Pain of left hip joint 05/06/2018  . Ischemia of finger 03/01/2018  . Palpitations 02/11/2016  . Foot infection 04/30/2015  . OA (osteoarthritis) of knee 02/11/2015  . Preop cardiovascular exam 01/25/2015  . Osteopenia 09/03/2014  . Dyspnea 08/14/2014  . Chronic diastolic congestive heart failure ([place]) 08/14/2014  . Pulmonary hypertension ([place]) 04/25/2014  . Chest pain 03/13/2014  . Systemic sclerosis ([place])   . Osteoarthritis   . Seasonal allergies   . Chronic rheumatic arthritis ([place])   . Raynaud  disease   . Hypertension   . Pulmonary fibrosis ([place])   . Arthritis of knee, degenerative 09/30/2011  . Lung disease with systemic sclerosis ([place]) 09/30/2011  . Osteoarthritis of both knees 09/30/2011   4:34 PM, 03/04/20 Selinda Eon  Orrin Brigham, DPT Physical Therapy with [facility]  [phone] office  [place] [address] [place], [place], [address] Phone: [phone]   Fax:  [phone]  Name: ARIEL DIMITRI MRN: [id number] Date of Birth: 1945/12/06

## 2020-03-08 ENCOUNTER — Ambulatory Visit (HOSPITAL_COMMUNITY): Payer: Medicare HMO | Admitting: Physical Therapy

## 2020-03-08 ENCOUNTER — Encounter (HOSPITAL_COMMUNITY): Payer: Self-pay | Admitting: Physical Therapy

## 2020-03-08 ENCOUNTER — Other Ambulatory Visit: Payer: Self-pay

## 2020-03-08 DIAGNOSIS — G8929 Other chronic pain: Secondary | ICD-10-CM

## 2020-03-08 DIAGNOSIS — R262 Difficulty in walking, not elsewhere classified: Secondary | ICD-10-CM | POA: Diagnosis not present

## 2020-03-08 DIAGNOSIS — M545 Low back pain, unspecified: Secondary | ICD-10-CM

## 2020-03-08 DIAGNOSIS — M6281 Muscle weakness (generalized): Secondary | ICD-10-CM

## 2020-03-08 NOTE — Therapy (Addendum)
[place] [address] [place], [place], [address] Phone: [phone]   Fax:  [phone]  Physical Therapy Treatment and Discharge Note  Patient Details  Name: [name] MRN: [id number] Date of Birth: [birth year] Referring Provider (PT): Lacie Draft, [place]   PHYSICAL THERAPY DISCHARGE SUMMARY  Visits from Start of Care:2  Current functional level related to goals / functional outcomes: Continued pain. Unable to assess goals secondary to unplanned discharge   Remaining deficits: Continued pain    Education / Equipment: See below  Plan: Patient agrees to discharge.  Patient goals were not met. Patient is being discharged due to the patient's request.  ?????        Encounter Date: 03/08/2020  PT End of Session - 03/08/20 1134    Visit Number  2    Number of Visits  12    Date for PT Re-Evaluation  04/29/20    Authorization Type  [employer] medic[employer] HMO. Auth required, No VL, Co-pay $40    Authorization Time Period  POC dates 03/04/20 to 04/29/20    Progress Note Due on Visit  10    PT Start Time  1134    PT Stop Time  1212    PT Time Calculation (min)  38 min    Equipment Utilized During Treatment  Back brace    Activity Tolerance  Patient tolerated treatment well    Behavior During Therapy  WFL for tasks assessed/performed       Past Medical History:  Diagnosis Date  . Diastolic congestive heart failure ([place])    02/05/15  states no fluid in extremities, breathing same as has been  . GERD (gastroesophageal reflux disease)   . History of blood transfusion   . Hx of cholecystectomy   . Interstitial lung disease ([place])   . Osteoarthritis   . Pulmonary fibrosis ([place])   . Raynaud disease   . Rheumatoid arthritis(714.0)   . Scleroderma ([place])   . Seasonal allergies   . Shortness of breath dyspnea    states normal for her  . Systemic sclerosis [facility])     Past Surgical History:  Procedure Laterality Date  . ABDOMINAL  HYSTERECTOMY    . BLADDER SUSPENSION    . CARPAL TUNNEL RELEASE    . LAPAROSCOPIC CHOLECYSTECTOMY    . LEFT HEART CATHETERIZATION WITH CORONARY/GRAFT ANGIOGRAM  03/15/2014   Procedure: LEFT HEART CATHETERIZATION WITH Beatrix Fetters;  Surgeon: Burnell Blanks, MD;  Location: [facility] CATH LAB;  Service: Cardiovascular;;  . TONSILLECTOMY    . TOTAL KNEE ARTHROPLASTY Left 02/11/2015   Procedure: LEFT TOTAL KNEE ARTHROPLASTY, RIGHT KNEE CORTISONE INJECTION;  Surgeon: Gaynelle Arabian, MD;  [facility];  Service: Orthopedics;  Laterality: Left;  . TOTAL KNEE ARTHROPLASTY Right 10/05/2016   Procedure: RIGHT TOTAL KNEE ARTHROPLASTY;  Surgeon: Gaynelle Arabian, MD;  [facility];  Service: Orthopedics;  Laterality: Right;  Marland Kitchen VESICOVAGINAL FISTULA CLOSURE W/ TAH  1987    There were no vitals filed for this visit.  Subjective Assessment - 03/08/20 1218    Subjective  Been getting cramps in calf muscles started yesterday and in the back of the thigh. worse with the exercise. States that her right arm also cramps when she is not exercise. Right hip radiating up back after exercises and walking makes it worse.    Pertinent History  CHF, shortness of breath, history of bilateral TKA    Limitations  Lifting;Standing;Walking;House hold activities    Diagnostic tests  MRI - compression fracture L4    Patient Stated Goals  to be able to walk and roll over without poain    Currently in Pain?  Yes    Pain Score  2     Pain Location  Back    Pain Orientation  Right;Lower    Pain Descriptors / Indicators  Aching;Sharp    Pain Type  Acute pain    Pain Onset  More than a month ago         [facility] PT Assessment - 03/08/20 0001      Assessment   Medical Diagnosis  wedge compression fracture - L4-     Referring Provider (PT)  Lacie Draft, PA                   [facility] Adult PT Treatment/Exercise - 03/08/20 0001      Lumbar Exercises: Supine   Ab Set  5 seconds   practiced for 10  minutes - prior demo, tactile and verbalcue     Manual Therapy   Manual Therapy  Soft tissue mobilization    Manual therapy comments  all manual interventions performed independently of other interventions    Soft tissue mobilization  STM/TPR to right lumbar paraspinals and QL - tolerated well -less pain and improved muscle tension noted afterwards.              PT Education - 03/08/20 1218    Education Details  educated patient on possible causes of muscle cramps: dehydration, electrolyte imbalance, new exercises. Educated on [employer]) Educated  Patient    Methods  Explanation    Comprehension  Verbalized understanding       PT Short Term Goals - 03/04/20 1546      PT SHORT TERM GOAL #1   Title  Patient will be able to roll over in bed without an increase in pain to demonstrate improved bed mobilities.    Time  4    Period  Weeks    Status  New    Target Date  04/01/20      PT SHORT TERM GOAL #2   Title  Patient will be independent in self management strategies to improve quality of life and functional outcomes.    Time  4    Period  Weeks    Status  New    Target Date  04/01/20      PT SHORT TERM GOAL #3   Title  Patient will report at least 25% improvement in overall symptoms and/or functional ability    Time  4    Period  Weeks    Status  New    Target Date  04/01/20        PT Long Term Goals - 03/04/20 1548      PT LONG TERM GOAL #1   Title  Patient will report at least 50% improvement in overall symptoms and/or functional ability    Time  8    Period  Weeks    Status  New    Target Date  04/29/20      PT LONG TERM GOAL #2   Title  Patient will score with < 45% limitation on FOTO to demonstrate improved functional mobility.    Time  8    Period  Weeks    Status  New    Target Date  04/29/20      PT LONG TERM GOAL #3   Title  Patient  will be able to stand and ambulate for at least 10 minutes without lumbar brace to demonstrate improved  functional mobility.    Time  8    Period  Weeks    Status  New    Target Date  04/29/20            Plan - 03/08/20 1134    Clinical Impression Statement  Initially focused on education on possible causes of increased muscle cramping. Discussed increasing water intake and electrolytes. Transitioned to manual and this abolished symptoms in right low back. Educated and practiced deep core bracing in supine and sitting to improve ability to wean from brace as patient does not like wearing it.    Personal Factors and Comorbidities  Comorbidity 1;Comorbidity 2    Comorbidities  CHF, shortness of breath, bilateral TKA (2017 and 2016)    Examination-Activity Limitations  Sit;Bed Mobility;Sleep;Squat;Stairs;Stand;Transfers;Locomotion Level    Examination-Participation Restrictions  Cleaning;Meal Prep;Shop    Stability/Clinical Decision Making  Evolving/Moderate complexity    Rehab Potential  Good    PT Frequency  Other (comment)   1-2x/week for 8 weeks for max total of 12 visits   PT Duration  8 weeks    PT Treatment/Interventions  ADLs/Self Care Home Management;Aquatic Therapy;Biofeedback;Cryotherapy;Electrical Stimulation;Moist Heat;Traction;Balance training;Therapeutic exercise;Therapeutic activities;Functional mobility training;Stair training;Gait training;Neuromuscular re-education;DME Instruction;Patient/family education;Manual techniques;Dry needling;Passive range of motion;Spinal Manipulations    PT Next Visit Plan  TRA contraction supine/seated; SIJ assessment, STM/joint mobilizations to sacrum and AP of lumbar spine, psoas STM, pelvic mobility and core/glute strengthening. MET as needed with SIJ assessment    PT Home Exercise Plan  4/5 hamstring curls, quad stretch, glute isometric; 4/9 RA contraction       Patient will benefit from skilled therapeutic intervention in order to improve the following deficits and impairments:  Pain, Decreased strength, Decreased mobility, Difficulty  walking, Decreased range of motion, Decreased activity tolerance  Visit Diagnosis: Chronic midline low back pain without sciatica  Muscle weakness (generalized)  Difficulty in walking, not elsewhere classified     Problem List Patient Active Problem List   Diagnosis Date Noted  . Fracture of metacarpal bone 07/07/2019  . DDD (degenerative disc disease), cervical 11/29/2018  . Pain of left hip joint 05/06/2018  . Ischemia of finger 03/01/2018  . Palpitations 02/11/2016  . Foot infection 04/30/2015  . OA (osteoarthritis) of knee 02/11/2015  . Preop cardiovascular exam 01/25/2015  . Osteopenia 09/03/2014  . Dyspnea 08/14/2014  . Chronic diastolic congestive heart failure ([place]) 08/14/2014  . Pulmonary hypertension ([place]) 04/25/2014  . Chest pain 03/13/2014  . Systemic sclerosis ([place])   . Osteoarthritis   . Seasonal allergies   . Chronic rheumatic arthritis ([place])   . Raynaud disease   . Hypertension   . Pulmonary fibrosis ([place])   . Arthritis of knee, degenerative 09/30/2011  . Lung disease with systemic sclerosis ([place]) 09/30/2011  . Osteoarthritis of both knees 09/30/2011   12:23 PM, 03/08/20 Jerene Pitch, DPT Physical Therapy with [facility]  [phone] office  [place] [address] [place], [place], [address] Phone: [phone]   Fax:  [phone]  Name: GEENA WEINHOLD MRN: [id number] Date of Birth: Aug 18, 1946

## 2020-03-12 DIAGNOSIS — R0982 Postnasal drip: Secondary | ICD-10-CM | POA: Diagnosis not present

## 2020-03-14 ENCOUNTER — Telehealth (HOSPITAL_COMMUNITY): Payer: Self-pay | Admitting: Physical Therapy

## 2020-03-14 NOTE — Telephone Encounter (Signed)
s/w the pt regarding scheduling her appt. Per the pt shwe does not want to come back at this time. will call us if she needs [place]. Pt wants to be discharged.

## 2020-03-14 NOTE — Progress Notes (Signed)
Virtual Visit via Telephone Note   This visit type was conducted due to national recommendations for restrictions regarding the COVID-19 Pandemic (e.g. social distancing) in an effort to limit this patient's exposure and mitigate transmission in our community.  Due to her co-morbid illnesses, this patient is at least at moderate risk for complications without adequate follow up.  This format is felt to be most appropriate for this patient at this time.  The patient did not have access to video technology/had technical difficulties with video requiring transitioning to audio format only (telephone).  All issues noted in this document were discussed and addressed.  No physical exam could be performed with this format.  Please refer to the patient's chart for her  consent to telehealth for [facility]. Virtual platform was offered given ongoing worsening Covid-19 pandemic.  Date:  03/18/2020   ID:  [name], DOB 07-Oct-1946, MRN [id number]  Patient Location: Home Provider Location: [facility]  PCP:  Asencion Noble, MD  Cardiologist:  Kirk Ruths, MD  Electrophysiologist:  None   Evaluation Performed:  Follow-Up Visit  Chief Complaint: follow-up of CHF  History of Present Illness:    [name] is a 74 y.o. female with a history of chronic diastolic CHF, palpitations, interstitial lung disease, CREST/scleroderma, hypertension, GERD, rheumatoid arthritis, and Raynaud's disease who is followed by Dr. Stanford Breed and presents today for routine follow-up.   Patient first seen by Dr. Stanford Breed in 02/2014 for evaluation of dyspnea on exertion and chest pain. Echo shortly prior to visit showed LVEF of 65-70% with normal wall motion and grade 1 diastolic dysfunction. Decision was made to proceed with right/left heart catheterization. This was performed on 03/15/2014 and showed no evidence of CAD, slight elevation of PA pressures and elevated wedge pressure. She was started on PO Lasix  10m daily with improvement. Chest CT in 02/2018 showed pulmonary fibrosis, cirrhosis/steatosis, and coronary calcification. She is followed by Dr. RChase Caller(Pulmonology). Patient was last seen by Dr. CStanford Breedin 08/2019 at which time she continued to report dyspnea on exertion but no exertional chest pain, orthopnea, PND, or edema. PO Lasix changed from 471mtwice daily to 8087maily at that visit because patient reported that she sometimes forgets afternoon dose.   Patient was last seen by Dr. RamChase Caller 12/26/2019 at which time she reported persistent shortness of breath that she felt was slightly worse than normal and a significant cough. High-resolution CT shortly before this visit was stable with no significant change since 2013, felt to reflect fibrotic phase non-specific interstitial pneumonia. Echo shortly before this visit showed LVEF of 60-65% with normal wall motion, moderate LVH, and grade 1 diastolic dysfunction. She was euvolemic on exam. Dyspnea was felt to be multifactorial in nature due to fibrosis, mil diastolic dysfunction, BMI, and overall deconditioning. Plan was to continue immune therapy per Rheumatology and wait for PFT test in 01/2020. If PFTs showed significant decline, anti-fibrotics would be discussed. Patient was also referred to Pulmonary Rehab.   Patient presents today for follow-up via telephone visit. 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 She has chronic dyspnea on exertion due to underlying lung disease but states that this is stable. She does note some sporadic chest tightness at rest that she states is difficult to explain. She states it occurs about once every couple of weeks and can last for up to 20 to 30 minutes but then resolves on its own. Occurs  at rest and frequently at night. She states pain seem to improved if she changes position at night and stretches out her shoulders. No exertional chest pain. Patient went  to a funeral yesterday and had to walk up a steep hill and denied any chest pain with this. She did have to stopped a couple times to catch her breath but she states she was actually impressed with how well she did from a breathing standpoint. She states this chest tightness has been going on for several years and is not getting worse in severity or frequency. No orthopnea or PND. She occasional has lower extremity edema if she has been sitting all day but this improves with elevation of legs. She has lost about 7lbs since last office visit. She continues to have some occasional palpitations that she described as a strong heart beat but no heart racing or irregular rhythm. She states that this is stable. No associated lightheadedness, dizziness, or syncope with this. She does note some lightheadedness though if she stands too quickly.   Past Medical History:  Diagnosis Date  . Diastolic congestive heart failure ([place])    02/05/15  states no fluid in extremities, breathing same as has been  . GERD (gastroesophageal reflux disease)   . History of blood transfusion   . Hx of cholecystectomy   . Interstitial lung disease ([place])   . Osteoarthritis   . Pulmonary fibrosis ([place])   . Raynaud disease   . Rheumatoid arthritis(714.0)   . Scleroderma ([place])   . Seasonal allergies   . Shortness of breath dyspnea    states normal for her  . Systemic sclerosis [facility])    Past Surgical History:  Procedure Laterality Date  . ABDOMINAL HYSTERECTOMY    . BLADDER SUSPENSION    . CARPAL TUNNEL RELEASE    . LAPAROSCOPIC CHOLECYSTECTOMY    . LEFT HEART CATHETERIZATION WITH CORONARY/GRAFT ANGIOGRAM  03/15/2014   Procedure: LEFT HEART CATHETERIZATION WITH Beatrix Fetters;  Surgeon: Burnell Blanks, MD;  Location: [facility] LAB;  Service: Cardiovascular;;  . TONSILLECTOMY    . TOTAL KNEE ARTHROPLASTY Left 02/11/2015   Procedure: LEFT TOTAL KNEE ARTHROPLASTY, RIGHT KNEE CORTISONE INJECTION;  Surgeon: Gaynelle Arabian, MD;  Location: [facility] ORS;  Service: Orthopedics;  Laterality: Left;  . TOTAL KNEE ARTHROPLASTY Right 10/05/2016   Procedure: RIGHT TOTAL KNEE ARTHROPLASTY;  Surgeon: Gaynelle Arabian, MD;  Location: [facility] ORS;  Service: Orthopedics;  Laterality: Right;  Marland Kitchen VESICOVAGINAL FISTULA CLOSURE W/ TAH  1987     Current Meds  Medication Sig  . acetaminophen (TYLENOL) 325 MG tablet Take 2 tablets (650 mg total) by mouth every 6 (six) hours as needed for mild pain (or Fever >/= 101).  Marland Kitchen albuterol (VENTOLIN HFA) 108 (90 Base) MCG/ACT inhaler Inhale 2 puffs into the lungs every 6 (six) hours as needed for wheezing.  Marland Kitchen amLODipine (NORVASC) 2.5 MG tablet Take 1 tablet (2.5 mg total) by mouth daily.  Marland Kitchen aspirin 81 MG chewable tablet Chew 81 mg by mouth daily. Start on 10/26/16 after completion of Xarelto  . furosemide (LASIX) 40 MG tablet Take 2 tablets (80 mg total) by mouth daily.  . hydroxychloroquine (PLAQUENIL) 200 MG tablet Take 400 mg by mouth daily.  . pantoprazole (PROTONIX) 40 MG tablet Take 40 mg by mouth 2 (two) times daily.  . predniSONE (DELTASONE) 5 MG tablet Take 5 mg by mouth daily with breakfast.   . triamcinolone (NASACORT ALLERGY 24HR) 55 MCG/ACT AERO nasal inhaler Place 2 sprays  into the nose daily.  Marland Kitchen zolpidem (AMBIEN) 10 MG tablet Take 10 mg by mouth at bedtime as needed for sleep.  . [DISCONTINUED] furosemide (LASIX) 40 MG tablet Take 2 tablets (80 mg total) by mouth daily.     Allergies:   Codeine   Social History   Tobacco Use  . Smoking status: Former Smoker    Packs/day: 1.00    Years: 20.00    Pack years: 20.00    Types: Cigarettes    Quit date: 11/30/1988    Years since quitting: 31.3  . Smokeless tobacco: Never Used  Substance Use Topics  . Alcohol use: No  . Drug use: No     Family Hx: The patient's family history includes Celiac disease in her unknown relative; Dementia in her brother; Emphysema in her father; Heart attack in her father; Heart disease in her mother;  Osteoarthritis in her unknown relative; Stroke in her mother.  ROS:   Please see the history of present illness.    All other systems reviewed and are negative.   Prior CV studies:    The following studies were reviewed today:   Right/Left Cardiac Catheterization 03/15/2014: Impression: 1. No angiographic evidence of CAD 2. Normal LV systolic function 3. Non-cardiac chest pain 4. Slight elevation PA pressures 5. Elevated wedge pressure  Recommendations: Will start low dose Lasix 40 mg po Daily. BMET in one week.  _______________  Echocardiogram 12/19/2019: Impressions: 1. Left ventricular ejection fraction, by visual estimation, is 60 to  65%. The left ventricle has normal function. There is moderately increased  left ventricular hypertrophy.  2. Left ventricular diastolic parameters are consistent with Grade I  diastolic dysfunction (impaired relaxation).  3. The left ventricle has no regional wall motion abnormalities.  4. Global right ventricle has normal systolic function.The right  ventricular size is normal. No increase in right ventricular wall  thickness.  5. Left atrial size was mildly dilated.  6. Right atrial size was normal.  7. The mitral valve is normal in structure. No evidence of mitral valve  regurgitation. No evidence of mitral stenosis.  8. The tricuspid valve is normal in structure.  9. The tricuspid valve is normal in structure. Tricuspid valve  regurgitation is not demonstrated.  10. The aortic valve is tricuspid. Aortic valve regurgitation is not  visualized. No evidence of aortic valve sclerosis or stenosis.  11. The pulmonic valve was not well visualized. Pulmonic valve  regurgitation is not visualized.  12. The inferior vena cava is normal in size with greater than 50%  respiratory variability, suggesting right atrial pressure of 3 mmHg.   Labs/Other Tests and Data Reviewed:    EKG:  Last EKG from 09/15/2019 personally reviewed and  showed: Normal sinus rhythm, rate 77 bpm, with underlying artifact but no acute ST/T changes. Q wave in lead III but these had been seen on prior tracings.   Recent Labs: 09/22/2019: BUN 14; Creatinine, Ser 0.80; Potassium 4.2; Sodium 140 11/27/2019: Hemoglobin 14.8; Platelets 291.0   Recent Lipid Panel No results found for: CHOL, TRIG, HDL, CHOLHDL, LDLCALC, LDLDIRECT  Wt Readings from Last 3 Encounters:  03/18/20 200 lb (90.7 kg)  12/26/19 207 lb 9.6 oz (94.2 kg)  11/27/19 204 lb (92.5 kg)     Objective:    Vital Signs:  Ht _0  (1.6 m)   Wt 200 lb (90.7 kg)   BMI 35.43 kg/m    VS Reviewed. General: No acute distress. Pulm: No labored breathing. No coughing during  visit. No audible wheezing. Speaking in full sentences. Neuro: Alert and oriented. No slurred speech. Answers questions appropriately. Psych: Pleasant affect.  ASSESSMENT & PLAN:    Chronic Diastolic CHF - Echo in 01/8176 showed LVEF of 60-65% with normal wall motion, moderate LVH, and grade 1 diastolic dysfunction. - Virtual visit so unable to exam volume status but denies any acute CHF symptoms. Weight down 8 lbs from last visit.  - Continue Lasix 16m daily. Will refill this today.  Atypical Chest Pain with Known Coronary Atherosclerosis  - LHC in 02/2014 showed no evidence of CAD; however, chest CT in 2019 showed coronary calcifications.  - Patient notes atypical chest tightness at rest that is somewhat positional. No exertional chest pain. States this has been going on for years with no increase in severity or frequency.  - Continue Aspirin 859mdaily. - Do not think any ischemic evaluation is needed at this time. However, patient to notify usKoreaf pain because worse, more frequent, or occurs with exertion.  Palpitations - Seems stable. Can consider outpatient monitor if these worsen in the future. - Of note, she does note some lightheadedness when standing too quickly (not related to palpitations) which  sounds like orthosasis. Advised patient to change positions slowly.    Hypertension - Patient unable to check BP this morning but states it usually is in the 120's/70's to 80's.  - Continue Amlodipine 2.2m72maily. She states she takes this mostly for her scleroderma.   Interstitial Lung Disease - High-resolution CT in 12/2019 was stable with no significant change since 2013, felt to reflect fibrotic phase non-specific interstitial pneumonia.  - Supposed to have PFTS in 01/2020 but looks like this did not happen.  - Followed by Dr. RamChase Caller  Time:   Today, I have spent 16 minutes with the patient with telehealth technology discussing the above problems.     Medication Adjustments/Labs and Tests Ordered: Current medicines are reviewed at length with the patient today.  Concerns regarding medicines are outlined above.   Follow Up:  In Person in 6 month(s) with Dr. CreStanford Breed Signed, CalDarreld McleanA-C  03/18/2020 12:48 PM    [facility]

## 2020-03-18 ENCOUNTER — Encounter: Payer: Self-pay | Admitting: Student

## 2020-03-18 ENCOUNTER — Telehealth (INDEPENDENT_AMBULATORY_CARE_PROVIDER_SITE_OTHER): Payer: Medicare HMO | Admitting: Student

## 2020-03-18 VITALS — Ht 63.0 in | Wt 200.0 lb

## 2020-03-18 DIAGNOSIS — I5032 Chronic diastolic (congestive) heart failure: Secondary | ICD-10-CM | POA: Diagnosis not present

## 2020-03-18 DIAGNOSIS — R002 Palpitations: Secondary | ICD-10-CM

## 2020-03-18 DIAGNOSIS — I110 Hypertensive heart disease with heart failure: Secondary | ICD-10-CM | POA: Diagnosis not present

## 2020-03-18 DIAGNOSIS — R0789 Other chest pain: Secondary | ICD-10-CM | POA: Diagnosis not present

## 2020-03-18 DIAGNOSIS — J849 Interstitial pulmonary disease, unspecified: Secondary | ICD-10-CM | POA: Diagnosis not present

## 2020-03-18 DIAGNOSIS — I2510 Atherosclerotic heart disease of native coronary artery without angina pectoris: Secondary | ICD-10-CM

## 2020-03-18 DIAGNOSIS — I10 Essential (primary) hypertension: Secondary | ICD-10-CM

## 2020-03-18 MED ORDER — FUROSEMIDE 40 MG PO TABS: 80.0000 mg | ORAL_TABLET | Freq: Every day | ORAL | 3 refills | Status: DC

## 2020-03-18 NOTE — Patient Instructions (Signed)
Medication Instructions:  Your physician recommends that you continue on your current medications as directed. Please refer to the Current Medication list given to you today.  *If you need a refill on your cardiac medications before your next appointment, please call your pharmacy*  Follow-Up: At [facility], you and your health needs are our priority.  As part of our continuing mission to provide you with exceptional heart care, we have created designated Provider Care Teams.  These Care Teams include your primary Cardiologist (physician) and Advanced Practice Providers (APPs -  Physician Assistants and Nurse Practitioners) who all work together to provide you with the care you need, when you need it.  We recommend signing up for the patient portal called "MyChart".  Sign up information is provided on this After Visit Summary.  MyChart is used to connect with patients for Virtual Visits (Telemedicine).  Patients are able to view lab/test results, encounter notes, upcoming appointments, etc.  Non-urgent messages can be sent to your provider as well.   To learn more about what you can do with MyChart, go to [email or web address].    Your next appointment:   6 month(s)  The format for your next appointment:   In Person  Provider:   You may see Kirk Ruths, MD or one of the following Advanced Practice Providers on your designated Care Team:    Kerin Ransom, PA-C  [place], [place]  Coletta Memos, [place]

## 2020-03-19 DIAGNOSIS — M5136 Other intervertebral disc degeneration, lumbar region: Secondary | ICD-10-CM | POA: Diagnosis not present

## 2020-04-08 DIAGNOSIS — E669 Obesity, unspecified: Secondary | ICD-10-CM | POA: Diagnosis not present

## 2020-04-08 DIAGNOSIS — M5126 Other intervertebral disc displacement, lumbar region: Secondary | ICD-10-CM | POA: Diagnosis not present

## 2020-04-08 DIAGNOSIS — M0579 Rheumatoid arthritis with rheumatoid factor of multiple sites without organ or systems involvement: Secondary | ICD-10-CM | POA: Diagnosis not present

## 2020-04-08 DIAGNOSIS — L03011 Cellulitis of right finger: Secondary | ICD-10-CM | POA: Diagnosis not present

## 2020-04-08 DIAGNOSIS — Z6836 Body mass index (BMI) 36.0-36.9, adult: Secondary | ICD-10-CM | POA: Diagnosis not present

## 2020-04-08 DIAGNOSIS — M340 Progressive systemic sclerosis: Secondary | ICD-10-CM | POA: Diagnosis not present

## 2020-04-08 DIAGNOSIS — M150 Primary generalized (osteo)arthritis: Secondary | ICD-10-CM | POA: Diagnosis not present

## 2020-04-08 DIAGNOSIS — J849 Interstitial pulmonary disease, unspecified: Secondary | ICD-10-CM | POA: Diagnosis not present

## 2020-04-08 DIAGNOSIS — I7300 Raynaud's syndrome without gangrene: Secondary | ICD-10-CM | POA: Diagnosis not present

## 2020-04-10 DIAGNOSIS — N958 Other specified menopausal and perimenopausal disorders: Secondary | ICD-10-CM | POA: Diagnosis not present

## 2020-04-10 DIAGNOSIS — Z1231 Encounter for screening mammogram for malignant neoplasm of breast: Secondary | ICD-10-CM | POA: Diagnosis not present

## 2020-04-10 DIAGNOSIS — Z6836 Body mass index (BMI) 36.0-36.9, adult: Secondary | ICD-10-CM | POA: Diagnosis not present

## 2020-04-10 DIAGNOSIS — M8588 Other specified disorders of bone density and structure, other site: Secondary | ICD-10-CM | POA: Diagnosis not present

## 2020-04-10 DIAGNOSIS — Z779 Other contact with and (suspected) exposures hazardous to health: Secondary | ICD-10-CM | POA: Diagnosis not present

## 2020-04-10 DIAGNOSIS — G4700 Insomnia, unspecified: Secondary | ICD-10-CM | POA: Diagnosis not present

## 2020-04-11 DIAGNOSIS — Z779 Other contact with and (suspected) exposures hazardous to health: Secondary | ICD-10-CM | POA: Diagnosis not present

## 2020-05-07 DIAGNOSIS — M542 Cervicalgia: Secondary | ICD-10-CM | POA: Diagnosis not present

## 2020-05-07 DIAGNOSIS — M519 Unspecified thoracic, thoracolumbar and lumbosacral intervertebral disc disorder: Secondary | ICD-10-CM | POA: Diagnosis not present

## 2020-05-11 ENCOUNTER — Other Ambulatory Visit: Payer: Self-pay | Admitting: Cardiology

## 2020-05-30 DIAGNOSIS — L03011 Cellulitis of right finger: Secondary | ICD-10-CM | POA: Diagnosis not present

## 2020-05-30 DIAGNOSIS — E669 Obesity, unspecified: Secondary | ICD-10-CM | POA: Diagnosis not present

## 2020-05-30 DIAGNOSIS — I7300 Raynaud's syndrome without gangrene: Secondary | ICD-10-CM | POA: Diagnosis not present

## 2020-05-30 DIAGNOSIS — J849 Interstitial pulmonary disease, unspecified: Secondary | ICD-10-CM | POA: Diagnosis not present

## 2020-05-30 DIAGNOSIS — Z6836 Body mass index (BMI) 36.0-36.9, adult: Secondary | ICD-10-CM | POA: Diagnosis not present

## 2020-05-30 DIAGNOSIS — M150 Primary generalized (osteo)arthritis: Secondary | ICD-10-CM | POA: Diagnosis not present

## 2020-05-30 DIAGNOSIS — M0579 Rheumatoid arthritis with rheumatoid factor of multiple sites without organ or systems involvement: Secondary | ICD-10-CM | POA: Diagnosis not present

## 2020-05-30 DIAGNOSIS — M340 Progressive systemic sclerosis: Secondary | ICD-10-CM | POA: Diagnosis not present

## 2020-05-30 DIAGNOSIS — M5126 Other intervertebral disc displacement, lumbar region: Secondary | ICD-10-CM | POA: Diagnosis not present

## 2020-06-07 ENCOUNTER — Other Ambulatory Visit: Payer: Self-pay

## 2020-06-07 DIAGNOSIS — R002 Palpitations: Secondary | ICD-10-CM

## 2020-06-07 MED ORDER — FUROSEMIDE 40 MG PO TABS: 80.0000 mg | ORAL_TABLET | Freq: Every day | ORAL | 3 refills | Status: DC

## 2020-06-10 DIAGNOSIS — R69 Illness, unspecified: Secondary | ICD-10-CM | POA: Diagnosis not present

## 2020-06-18 ENCOUNTER — Other Ambulatory Visit: Payer: Self-pay

## 2020-06-18 DIAGNOSIS — R002 Palpitations: Secondary | ICD-10-CM

## 2020-07-23 ENCOUNTER — Ambulatory Visit (INDEPENDENT_AMBULATORY_CARE_PROVIDER_SITE_OTHER): Payer: Medicare HMO | Admitting: Internal Medicine

## 2020-07-23 ENCOUNTER — Other Ambulatory Visit: Payer: Self-pay

## 2020-07-23 DIAGNOSIS — M359 Systemic involvement of connective tissue, unspecified: Secondary | ICD-10-CM | POA: Diagnosis not present

## 2020-07-23 DIAGNOSIS — J8489 Other specified interstitial pulmonary diseases: Secondary | ICD-10-CM | POA: Diagnosis not present

## 2020-07-23 LAB — PULMONARY FUNCTION TEST
DL/VA % pred: 142 %
DL/VA: 5.92 ml/min/mmHg/L
DLCO cor % pred: 90 %
DLCO cor: 16.83 ml/min/mmHg
DLCO unc % pred: 90 %
DLCO unc: 16.83 ml/min/mmHg
FEF 25-75 Pre: 0.84 L/sec
FEF2575-%Pred-Pre: 48 %
FEV1-%Pred-Pre: 59 %
FEV1-Pre: 1.25 L
FEV1FVC-%Pred-Pre: 97 %
FEV6-%Pred-Pre: 63 %
FEV6-Pre: 1.69 L
FEV6FVC-%Pred-Pre: 104 %
FVC-%Pred-Pre: 60 %
FVC-Pre: 1.7 L
Pre FEV1/FVC ratio: 73 %
Pre FEV6/FVC Ratio: 99 %

## 2020-07-23 NOTE — Progress Notes (Signed)
Spiro/DLCO performed.

## 2020-08-01 ENCOUNTER — Other Ambulatory Visit: Payer: Self-pay

## 2020-08-01 ENCOUNTER — Encounter: Payer: Self-pay | Admitting: Podiatry

## 2020-08-01 ENCOUNTER — Ambulatory Visit: Payer: Medicare HMO | Admitting: Podiatry

## 2020-08-01 DIAGNOSIS — D689 Coagulation defect, unspecified: Secondary | ICD-10-CM | POA: Diagnosis not present

## 2020-08-01 DIAGNOSIS — Q828 Other specified congenital malformations of skin: Secondary | ICD-10-CM | POA: Diagnosis not present

## 2020-08-01 DIAGNOSIS — D688 Other specified coagulation defects: Secondary | ICD-10-CM

## 2020-08-01 NOTE — Progress Notes (Signed)
She presents today for chief complaint of a painful poor keratoma subfifth metatarsal head bilaterally.  Objective: Vital signs are stable she is alert oriented x3 there is no erythema edema cellulitis drainage or odor.  Reactive hyperkeratotic lesion.  No open lesions or wounds are noted.  Assessment: Porokeratosis subfifth met head bilateral.  Plan: Debrided all reactive hyperkeratotic tissue to normal skin today we will follow-up with her as needed no iatrogenic lesions.

## 2020-08-08 ENCOUNTER — Other Ambulatory Visit: Payer: Self-pay

## 2020-08-08 ENCOUNTER — Encounter: Payer: Self-pay | Admitting: Internal Medicine

## 2020-08-08 ENCOUNTER — Ambulatory Visit: Payer: Medicare HMO | Admitting: Internal Medicine

## 2020-08-08 VITALS — BP 116/78 | Temp 97.9°F | Ht 63.0 in | Wt 198.4 lb

## 2020-08-08 DIAGNOSIS — R0600 Dyspnea, unspecified: Secondary | ICD-10-CM

## 2020-08-08 DIAGNOSIS — M359 Systemic involvement of connective tissue, unspecified: Secondary | ICD-10-CM

## 2020-08-08 DIAGNOSIS — J849 Interstitial pulmonary disease, unspecified: Secondary | ICD-10-CM

## 2020-08-08 DIAGNOSIS — J8489 Other specified interstitial pulmonary diseases: Secondary | ICD-10-CM

## 2020-08-08 DIAGNOSIS — M349 Systemic sclerosis, unspecified: Secondary | ICD-10-CM | POA: Diagnosis not present

## 2020-08-08 DIAGNOSIS — Z7189 Other specified counseling: Secondary | ICD-10-CM

## 2020-08-08 DIAGNOSIS — Z7185 Encounter for immunization safety counseling: Secondary | ICD-10-CM

## 2020-08-08 DIAGNOSIS — R0609 Other forms of dyspnea: Secondary | ICD-10-CM

## 2020-08-08 NOTE — Patient Instructions (Addendum)
Interstitial lung disease due to connective tissue disease ([place]) Scleroderma ([place]) with RA overalap  - stable on symptoms - pulmonary fibrosis stable on CT 2013 -> 2021 on CT - breathing test over years through sept 2021 show potential decline   Plan - continue immune Rx per rheumatology - plaquenil and daily prednisone  - we discussed anti-fibrotic ofev  But given diarrhea risk and stability with symptoms you decided to hold off - resepct that - do HRCT supine and prone in Mid- Jan 2022 - will hold off on repeat breathing test due to incoinvenience  Shortness of breath   - multifactorial - fibrosis, weight, diastolic dysfunction, fitness all weighing  Plan  - refer pulmonary rehab  - depending on course might need right heart cath esp if inhaled tyvaso gets approved for this indication of pulmonary hypertension (summer 2021 maybe) -- continue lasix and other medicines as before  Vaccine Counseling   plan  - recommend high dose flu shot  - recommend 3rd booster for covid - Please talk to PCP Asencion Noble, MD -  and ensure you get  shingrix ([place]) inactivated vaccine against shingles   Followup  30 min slot in Jan 2022 - after CT; symptoms socre and simple walk test at that time

## 2020-08-08 NOTE — Progress Notes (Signed)
OV 11/27/2019  Subjective:  Patient ID: [name], female , DOB: [birth year] , age 74 y.o. , MRN: [id number] , ADDRESS: [place] [address]   11/27/2019 -   Chief Complaint  Patient presents with  . Follow-up    Former BQ pt. Pt states she does become SOB with activities and also at times when at rest. Pt also has an occ cough which is worse in the morning and will cough up clear phlegm.   Follow-up interstitial lung disease secondary to connective tissue disease [rheumatologist Tacey Heap, M.D., M.H.S. at [facility] but now Dr Richardson Landry at  [facility])  ILD: Dr Joya Gaskins -> Dr Lake Bells -> DR Chase Caller at ILD center [place] ,  HPI [name] 74 y.o. -presents to the ILD clinic for evaluation of her interstitial lung disease secondary to connective tissue disease (rheumatoid factor 90, ANA 1: 640, ANA second titer 1: 2000 560, positive SCL 70 August 2012 at [facility] and positive [place].  I am meeting her for the first time.  Her rheumatology situation is managed by Dr. Richardson Landry at [place].  She no longer follows at [facility] after the doctor they relocated to [place].  She is maintained on 5 mg prednisone per day.  She is not on any other immunomodulators.  Her last TNF alpha blockade was over 3 years ago.  She is known to have interstitial lung disease.  Last staged in March 2019.  She tells me that since then she continues to have significant amount of cough and shortness of breath.  She believes the shortness of breath is slightly worse.  She thinks her pulmonary issues itself is stable.  She thinks the worsening shortness of breath and the persistent shortness of breath could be due to cardiac issues.  In 2015 she had a right heart catheterization evaluation that was normal.  Last echo was in 2015.  No further work-up since then.  Dyspnea symptom score is listed below.  She in addition she also has significant amount of  cough that she attributes to sinus drainage.  She is not had a CT scan of the sinus.  She says she is allergic to a few things but last tested several years ago.  She does not remember when.  She has seen ENT many years ago does not know the details.  At this point in time she would love to have her symptoms better managed.  IMPRESSION: HRCT Lungs/Pleura: Subpleural reticulation and traction bronchiectasis/bronchiolectasis are again seen without a definite zonal predominance. Findings are worst in the right middle lobe. Pattern and distribution appear stable from prior exams. No pleural fluid. Airway is unremarkable. No air trapping. 1. Pulmonary parenchymal pattern of fibrosis is stable from prior exams and likely due to fibrotic nonspecific interstitial pneumonitis. 2. Cirrhosis, steatosis. 3. Aortic atherosclerosis (ICD10-170.0). Coronary artery calcification.   Electronically Signed   By: Lorin Picket M.D.   On: 03/17/2018 09:09  ROS - per HPI    OV 12/26/2019  Subjective:  Patient ID: [name], female , DOB: 14-Dec-1945 , age 74 y.o. , MRN: [id number] , ADDRESS: [address] [place] [address]   12/26/2019 -   Chief Complaint  Patient presents with  . Follow-up    Pt states she has been doing well since last visit and denies any complaints.   Follow-up interstitial lung disease secondary to connective tissue disease  [rheumatologist previously - Beatriz Stallion, M.D.,  M.H.S. at [facility] but now Dr Richardson Landry at  [facility]  - per records - ILD since 2009  - systemic sclerosis/RA overlap per [facility] notes  = Telangiectasias, Raynaud's, esophageal dysmotility - ? calcinosis on right heel. Per [facility] notes  - Serology   - 2012 at [place]: (rheumatoid factor 90, ANA 1: 640, ANA second titer 1: 2000 560, positive SCL 70 August 2012).   -  [facility] RF + (low titer), ANA + (low titer), SCl 70 +  (in [facility])  - ILD: Dr Joya Gaskins -> Dr [place] Bells -> DR Chase Caller at ILD center  [place]   Rx History - all [place] initiated 12/2011 d/c due to lack of control of arthrits D. MTX initiated 04/2012 -discontinued due to concerns since patient has underlying lung disease   D- CHF - 2015 RHC   - She had a right heart cath in April 2015 which showed the following:  Hemodynamic Findings:  PA: 43/18 (mean 31)  PCWP: 25  Fick Cardiac Index: 3.45 L/min/m2    xxxxxxxxxxxxxxxxxxxxxxxxxxxxxxxxxxxxxxxxxxxxxxxxxxxxxx HPI [name] 74 y.o. -returns for follow-up to discuss her test results.  She has interstitial lung disease secondary to connective tissue disease.  She has not been able to do her pulmonary function test because of the COVID-19 pandemic and the disruptions.  It is scheduled in the next few to several weeks.  She did have repeat serology and the overwhelmingly strong antibodies to scleroderma antibody.  The rheumatoid factor and ANA weakly positive only.  Her high-resolution CT chest scan of the chest according to thoracic radiology that I personally visualized shows stable pattern compared to 2013.  This is somewhat discrepant with the pulmonary function test that is shown decline and the symptoms are showing decline.  Of course the upcoming pulmonary function test is not available.  Nevertheless it is reassuring that the CT scan is stable.  We discussed her symptoms and it appears she does have diastolic dysfunction is based on the echo.  Clinically she is euvolemic.  She is also physically deconditioned and the BMI is high.  Her other big symptom burden is a cough secondary to significant postnasal drainage.  The CT scan of the sinus itself is normal.  She does have some deviated nasal septum.  Her blood IgE is normal. ANCA is normal   SYMPTOM SCALE - ILD 12/26/2019   O2 use ra  Shortness of Breath 0 -> 5 scale with 5 being worst (score 6 If unable to do)  At rest 0  Simple tasks - showers, clothes change, eating, shaving 3  Household (dishes, doing  bed, laundry) 3  Shopping 3  Walking level at own pace 3  Walking up Stairs 2  Total (30-36) Dyspnea Score 14  How bad is your cough? 3  How bad is your fatigue 3  How bad is nausea 3  How bad is vomiting?  0  How bad is diarrhea? 3  How bad is anxiety? 3  How bad is depression 3    Serology Jan 2021  Results for Vickie, [name] (MRN [id number]) as of 12/26/2019 11:49  Ref. Range 11/27/2019 12:43  Anti Nuclear Antibody (ANA) Latest Ref Range: NEGATIVE  POSITIVE (A)  ANA Pattern 1 Unknown Nuclear, Speckled (A)  ANA Titer 1 Latest Units: titer 3:53 (H)  Cyclic Citrullin Peptide Ab Latest Units: UNITS <16  Myeloperoxidase Abs Latest Units: AI <1.0  Serine Protease 3 Latest Units: AI <1.0  RA Latex Turbid.  Latest Ref Range: <14 IU/mL 17 (H)     IMPRESSIONS - echo jan 2021    1. Left ventricular ejection fraction, by visual estimation, is 60 to 65%. The left ventricle has normal function. There is moderately increased left ventricular hypertrophy.  2. Left ventricular diastolic parameters are consistent with Grade I diastolic dysfunction (impaired relaxation).  3. The left ventricle has no regional wall motion abnormalities.  4. Global right ventricle has normal systolic function.The right ventricular size is normal. No increase in right ventricular wall thickness.  5. Left atrial size was mildly dilated.  6. Right atrial size was normal.  7. The mitral valve is normal in structure. No evidence of mitral valve regurgitation. No evidence of mitral stenosis.  8. The tricuspid valve is normal in structure.  9. The tricuspid valve is normal in structure. Tricuspid valve regurgitation is not demonstrated. 10. The aortic valve is tricuspid. Aortic valve regurgitation is not visualized. No evidence of aortic valve sclerosis or stenosis. 11. The pulmonic valve was not well visualized. Pulmonic valve regurgitation is not visualized. 12. The inferior vena cava is normal in size with  greater than 50% respiratory variability, suggesting right atrial pressure of 3 mmHg.    Results for Vickie, [name] (MRN [id number]) as of 11/27/2019 12:15  Ref. Range 09/05/2013 15:00 03/07/2014 12:09 02/07/2015 10:34 09/22/2016 15:33 02/25/2018 14:59  FVC-Pre Latest Units: L ? 2.03 2.04 1.97 1.94 1.75  FVC-%Pred-Pre Latest Units: % 68 70 68 68 60   Results for Vickie, [name] (MRN [id number]) as of 11/27/2019 12:15  Ref. Range 09/05/2013 15:00 03/07/2014 12:09 02/07/2015 10:34 09/22/2016 15:33 02/25/2018 14:59  DLCO unc Latest Units: ml/min/mmHg 14.81 14.71 15.60 15.97 16.44  DLCO unc % pred Latest Units: % 68 68 72 74 71    IMPRESSION: HRCT JAn 2021 1. There is a spectrum of findings in the lungs compatible with interstitial lung disease, which appear stable compared to the prior examination. This is technically characterized as compatible with usual interstitial pneumonia (UIP) per current ATS guidelines. However, given the stability dating back to at least 2013 and the overall appearance, this is again favored to reflect fibrotic phase nonspecific interstitial pneumonia (NSIP). 2. Aortic atherosclerosis, in addition to left main and 3 vessel coronary artery disease. Assessment for potential risk factor modification, dietary therapy or pharmacologic therapy may be warranted, if clinically indicated. 3. Hepatic steatosis.  Aortic Atherosclerosis (ICD10-I70.0).   Electronically Signed   By: Vinnie Langton M.D.   On: 12/19/2019 14:33  CT SINUS JAn 2021  EXAM: CT PARANASAL SINUS LIMITED WITHOUT CONTRAST  TECHNIQUE: Non-contiguous multidetector CT images of the paranasal sinuses were obtained in a single plane without contrast.  COMPARISON:  None.  FINDINGS: The included portions of the paranasal sinuses are clear without evidence of significant mucosal thickening or fluid. Rightward nasal septal deviation measures 3 mm. The regional soft tissues are grossly  unremarkable.  IMPRESSION: Clear paranasal sinuses.   Electronically Signed   By: Logan Bores M.D.   On: 12/19/2019 14:15   ECHO JAn 2021  IMPRESSIONS    1. Left ventricular ejection fraction, by visual estimation, is 60 to 65%. The left ventricle has normal function. There is moderately increased left ventricular hypertrophy.  2. Left ventricular diastolic parameters are consistent with Grade I diastolic dysfunction (impaired relaxation).  3. The left ventricle has no regional wall motion abnormalities.  4. Global right ventricle has normal systolic function.The right ventricular size is normal. No increase in right ventricular  wall thickness.  5. Left atrial size was mildly dilated.  6. Right atrial size was normal.  7. The mitral valve is normal in structure. No evidence of mitral valve regurgitation. No evidence of mitral stenosis.  8. The tricuspid valve is normal in structure.  9. The tricuspid valve is normal in structure. Tricuspid valve regurgitation is not demonstrated. 10. The aortic valve is tricuspid. Aortic valve regurgitation is not visualized. No evidence of aortic valve sclerosis or stenosis. 11. The pulmonic valve was not well visualized. Pulmonic valve regurgitation is not visualized. 12. The inferior vena cava is normal in size with greater than 50% respiratory variability, suggesting right atrial pressure of 3 mmHg.   OV 08/08/2020  Subjective:  Patient ID: [name], female , DOB: September 01, 1946 , age 3 y.o. , MRN: [id number] , ADDRESS: [place] [address]   08/08/2020 -   Chief Complaint  Patient presents with  . Follow-up    here to go over pft results    Follow-up interstitial lung disease secondary to connective tissue disease  [rheumatologist previously - Beatriz Stallion, M.D., M.H.S. at [facility] but now Dr Richardson Landry at  [facility])  - per records - ILD since 2009  - systemic sclerosis/RA overlap per [facility] notes  = Telangiectasias,  Raynaud's, esophageal dysmotility - ? calcinosis on right heel. Per [facility] notes  - Serology   - 2012 at [place]: (rheumatoid factor 90, ANA 1: 640, ANA second titer 1: 2000 560, positive SCL 70 August 2012).   -  [facility]: RF + (low titer), ANA + (low titer), SCl 70 +  (in [facility])  -High-resolution CT chest January 2021 with UIP features [stable since 2013]  - ILD: Dr Joya Gaskins -> Dr [place] Bells -> DR Chase Caller at ILD center [place]     Rx History - all [place] initiated 12/2011 d/c due to lack of control of arthrits D. MTX initiated 04/2012 -discontinued due to concerns since patient has underlying lung disease   D- CHF -   RHC  -  - She had a right heart cath in April 2015 which showed the following: Hemodynamic Findings:  PA: 43/18 (mean 31)  PCWP: 25  Fick Cardiac Index: 3.45 L/min/m2   -Grade 1 diastolic dysfunction on echocardiogram January 2021   HPI [name] 74 y.o. -presents for the above issues.  She is on observation therapy.  Overall she states that her shortness of breath and symptoms are the same although she is quite symptomatic.  She understands that she has multifactorial dyspnea from obesity, diastolic dysfunction, fitness and ILD.  She had a pulmonary function test that is documented below.  Shows continued decline in FVC but the DLCO is stable.  She could not attend pulmonary rehabilitation because sometime in the spring I suppose she fractured her left hand.  She is willing to reattend pulmonary rehabilitation.  We discussed the idea of starting antifibrotic's given the fact she has ILD associated with scleroderma.  But she has baseline mild diarrhea which is from irritable bowel syndrome.  She says that GI has cleared her and she is no longer at follow-up with them.  But she is afraid to take nintedanib because of the diarrhea.  We discussed the alternative of taking immunosuppression such as CellCept and or Tocilizumab but she is definitely resistant  to this idea because of the immunomodulation  In terms of vaccine: She is in need of having the new Shingrix vaccine.  She can benefit from a Covid booster.  She can also benefit from high-dose flu shot.  We discussed all this.     SYMPTOM SCALE - ILD 12/26/2019  08/08/2020 198#  O2 use ra ra  Shortness of Breath 0 -> 5 scale with 5 being worst (score 6 If unable to do)   At rest 0 0  Simple tasks - showers, clothes change, eating, shaving 3 2  Household (dishes, doing bed, laundry) _0 Walking level at own pace 3 2  Walking up Stairs 2 3  Total (30-36) Dyspnea Score 14 12  How bad is your cough? 3 3  How bad is your fatigue 3 3  How bad is nausea 3 2  How bad is vomiting?  0 0  How bad is diarrhea? 3 0  How bad is anxiety? 3 3  How bad is depression 3 3    Simple office walk 185 feet x  3 laps goal with forehead probe 08/08/2020   O2 used ra  Number laps completed 3  Comments about pace Mild steady pace  Resting Pulse Ox/HR 96% and 79/min  Final Pulse Ox/HR 94% and 101/min  Desaturated </= 88% no  Desaturated <= 3% points no  Got Tachycardic >/= 90/min yes  Symptoms at end of test none  Miscellaneous comments x   PFT Results Latest Ref Rng & Units 07/23/2020 02/25/2018 09/22/2016 02/07/2015 03/07/2014 09/05/2013  FVC-Pre L 1.70 1.75 1.94 1.97 2.04 -  FVC-Predicted Pre % 60 60 68 68 70 68  FVC-Post L - 1.78 1.92 1.94 - 2.03  FVC-Predicted Post % - 61 68 67 - 69  Pre FEV1/FVC % % 73 77 74 76 75 79  Post FEV1/FCV % % - 78 79 80 - 82  FEV1-Pre L 1.25 1.35 1.44 1.50 1.54 1.57  FEV1-Predicted Pre % 59 62 67 68 69 70  FEV1-Post L - 1.38 1.51 1.56 - 1.66  DLCO uncorrected ml/min/mmHg 16.83 16.44 15.97 15.60 14.71 14.81  DLCO UNC% % 90 71 74 72 68 68  DLCO corrected ml/min/mmHg 16.83 - 15.87 - - -  DLCO COR %Predicted % 90 - 73 - - -  DLVA Predicted % 142 122 114 115 111 107  TLC L - - 3.40 2.92 - 3.00  TLC % Predicted % - - 71 61 - 63  RV % Predicted % - - 72 50 - 54      ROS - per HPI     has a past medical history of Diastolic congestive heart failure (HCC), GERD (gastroesophageal reflux disease), History of blood transfusion, cholecystectomy, Interstitial lung disease ([place]), Osteoarthritis, Pulmonary fibrosis (HCC), Raynaud disease, Rheumatoid arthritis(714.0), Scleroderma ([place]), Seasonal allergies, Shortness of breath dyspnea, and Systemic sclerosis ([place]).   reports that she quit smoking about 31 years ago. Her smoking use included cigarettes. She has a 20.00 pack-year smoking history. She has never used smokeless tobacco.  Past Surgical History:  Procedure Laterality Date  . ABDOMINAL HYSTERECTOMY    . BLADDER SUSPENSION    . CARPAL TUNNEL RELEASE    . LAPAROSCOPIC CHOLECYSTECTOMY    . LEFT HEART CATHETERIZATION WITH CORONARY/GRAFT ANGIOGRAM  03/15/2014   Procedure: LEFT HEART CATHETERIZATION WITH Beatrix Fetters;  Surgeon: Burnell Blanks, MD;  Location: [facility] C[facility] LAB;  Service: Cardiovascular;;  . TONSILLECTOMY    . TOTAL KNEE ARTHROPLASTY Left 02/11/2015   Procedure: LEFT TOTAL KNEE ARTHROPLASTY, RIGHT KNEE CORTISONE INJECTION;  Surgeon: Gaynelle Arabian, MD;  Location: [facility] ORS;  Service: Orthopedics;  Laterality: Left;  . TOTAL KNEE ARTHROPLASTY Right 10/05/2016   Procedure: RIGHT TOTAL KNEE ARTHROPLASTY;  Surgeon: Gaynelle Arabian, MD;  Location: [facility] ORS;  Service: Orthopedics;  Laterality: Right;  Marland Kitchen VESICOVAGINAL FISTULA CLOSURE W/ TAH  1987    Allergies  Allergen Reactions  . Codeine     Neurological problems - hallucinations, "spacey"    Immunization History  Administered Date(s) Administered  . Influenza Split 08/01/2011, 09/02/2012, 08/30/2013, 08/29/2016  . Influenza, High Dose Seasonal PF 08/28/2017  . Influenza,inj,Quad PF,6+ Mos 09/03/2014  . Influenza-Unspecified 08/14/2014, 08/15/2019  . Moderna SARS-COVID-2 Vaccination 12/21/2018, 01/24/2020  . Pneumococcal Conjugate-13 09/19/2014, 11/28/2014  . Pneumococcal  Polysaccharide-23 11/30/2008, 08/30/2009  . Zoster 11/30/2008    Family History  Problem Relation Age of Onset  . Stroke Mother   . Heart disease Mother        CABG at age 60  . Heart attack Father        Died of MI at age 35  . Emphysema Father   . Dementia Brother   . Osteoarthritis Unknown   . Celiac disease Unknown      Current Outpatient Medications:  .  acetaminophen (TYLENOL) 325 MG tablet, Take 2 tablets (650 mg total) by mouth every 6 (six) hours as needed for mild pain (or Fever >/= 101)., Disp: 40 tablet, Rfl: 0 .  albuterol (VENTOLIN HFA) 108 (90 Base) MCG/ACT inhaler, Inhale 2 puffs into the lungs every 6 (six) hours as needed for wheezing., Disp: 18 g, Rfl: 3 .  amLODipine (NORVASC) 2.5 MG tablet, TAKE 1 TABLET EVERY DAY, Disp: 90 tablet, Rfl: 2 .  aspirin 81 MG chewable tablet, Chew 81 mg by mouth daily. Start on 10/26/16 after completion of Xarelto, Disp: , Rfl:  .  furosemide (LASIX) 40 MG tablet, Take 2 tablets (80 mg total) by mouth daily., Disp: 180 tablet, Rfl: 3 .  hydroxychloroquine (PLAQUENIL) 200 MG tablet, Take 400 mg by mouth daily., Disp: , Rfl:  .  pantoprazole (PROTONIX) 40 MG tablet, Take 40 mg by mouth 2 (two) times daily., Disp: , Rfl:  .  predniSONE (DELTASONE) 5 MG tablet, Take 5 mg by mouth daily with breakfast. , Disp: , Rfl:  .  triamcinolone (NASACORT ALLERGY 24HR) 55 MCG/ACT AERO nasal inhaler, Place 2 sprays into the nose daily., Disp: , Rfl:  .  zolpidem (AMBIEN) 10 MG tablet, Take 10 mg by mouth at bedtime as needed for sleep., Disp: , Rfl:       Objective:   Vitals:   08/08/20 1134  BP: 116/78  Temp: 97.9 F (36.6 C)  TempSrc: Oral  Weight: 198 lb 6.4 oz (90 kg)  Height: 5' 3" (1.6 m)    Estimated body mass index is 35.14 kg/m as calculated from the following:   Height as of this encounter: 5' 3" (1.6 m).   Weight as of this encounter: 198 lb 6.4 oz (90 kg).  _0 @  Filed Weights   08/08/20 1134  Weight: 198 lb  6.4 oz (90 kg)     Physical Exam  General Appearance:    Alert, cooperative, no distress, appears stated age - yes , Deconditioned looking - no , OBESE  - yes, Sitting on Wheelchair -  no  Head:    Normocephalic, without obvious abnormality, atraumatic  Eyes:    PERRL, conjunctiva/corneas clear,  Ears:    Normal TM's and external ear canals, both ears  Nose:   Nares normal,  septum midline, mucosa normal, no drainage    or sinus tenderness. OXYGEN ON  - no . Patient is @ ra   Throat:   Lips, mucosa, and tongue normal; teeth and gums normal. Cyanosis on lips - no  Neck:   Supple, symmetrical, trachea midline, no adenopathy;    thyroid:  no enlargement/tenderness/nodules; no carotid   bruit or JVD  Back:     Symmetric, no curvature, ROM normal, no CVA tenderness  Lungs:     Distress - no , Wheeze no, Barrell Chest - no, Purse lip breathing - no, Crackles - no   Chest Wall:    No tenderness or deformity.    Heart:    Regular rate and rhythm, S1 and S2 normal, no rub   or gallop, Murmur - no  Breast Exam:    NOT DONE  Abdomen:     Soft, non-tender, bowel sounds active all four quadrants,    no masses, no organomegaly. Visceral obesity - yes  Genitalia:   NOT DONE  Rectal:   NOT DONE  Extremities:   Extremities - normal, Has Cane - no, Clubbing - no, Edema - no  Pulses:   2+ and symmetric all extremities  Skin:   Stigmata of Connective Tissue Disease - MILD SCLERODERMA  Lymph nodes:   Cervical, supraclavicular, and axillary nodes normal  Psychiatric:  Neurologic:   Pleasant - yes, Anxious - n, Flat affect - no  CAm-ICU - neg, Alert and Oriented x 3 - yes, Moves all 4s - yes, Speech - normal, Cognition - intact           Assessment:       ICD-10-CM   1. Interstitial lung disease due to connective tissue disease ([place])  J84.89    M35.9   2. Scleroderma ([place])  M34.9   3. Dyspnea on exertion  R06.00   4. Vaccine counseling  Z71.89    At this point in time she is resolved to  continue supportive care.  She wants follow-up high-resolution CT chest [does not want PFTs] at the 1 year timeframe in January 2022.  Certainly if this is showing decline then she will be more open to the idea of antifibrotic's.  She is not interested immunomodulator.  She is willing to attend pulmonary rehabilitation.    Plan:     Patient Instructions  Interstitial lung disease due to connective tissue disease ([place]) Scleroderma ([place]) with RA overalap  - stable on symptoms - pulmonary fibrosis stable on CT 2013 -> 2021 on CT - breathing test over years through sept 2021 show potential decline   Plan - continue immune Rx per rheumatology - plaquenil and daily prednisone  - we discussed anti-fibrotic ofev  But given diarrhea risk and stability with symptoms you decided to hold off - resepct that - do HRCT supine and prone in Mid- Jan 2022 - will hold off on repeat breathing test due to incoinvenience  Shortness of breath   - multifactorial - fibrosis, weight, diastolic dysfunction, fitness all weighing  Plan  - refer pulmonary rehab  - depending on course might need right heart cath esp if inhaled tyvaso gets approved for this indication of pulmonary hypertension (summer 2021 maybe) -- continue lasix and other medicines as before  Vaccine Counseling   plan  - recommend high dose flu shot  - recommend 3rd booster for covid - Please talk to PCP Asencion Noble, MD -  and ensure you get  shingrix (GSK) inactivated vaccine  against shingles   Followup  30 min slot in Jan 2022 - after CT; symptoms socre and simple walk test at that time   ( Level 05 visit: Estb 40-54 min   in  visit type: on-site physical face to visit  in total care time and counseling or/and coordination of care by this undersigned MD - Dr Brand Males. This includes one or more of the following on this same day 08/08/2020: pre-charting, chart review, note writing, documentation discussion of test results, diagnostic  or treatment recommendations, prognosis, risks and benefits of management options, instructions, education, compliance or risk-factor reduction. It excludes time spent by the [employer] or office staff in the care of the patient. Actual time 81 min)   SIGNATURE    Dr. Brand Males, M.D., F.C.C.P,  Pulmonary and Critical Care Medicine Staff Physician, [place] Director - Interstitial Lung Disease  Program  Pulmonary [place] at [place], [place], [address]  Pager: [phone], If no answer or between  15:00h - 7:00h: call [phone] Telephone: [phone]  12:38 PM 08/08/2020

## 2020-10-03 DIAGNOSIS — H25813 Combined forms of age-related cataract, bilateral: Secondary | ICD-10-CM | POA: Diagnosis not present

## 2020-10-03 DIAGNOSIS — H04123 Dry eye syndrome of bilateral lacrimal glands: Secondary | ICD-10-CM | POA: Diagnosis not present

## 2020-10-03 DIAGNOSIS — M069 Rheumatoid arthritis, unspecified: Secondary | ICD-10-CM | POA: Diagnosis not present

## 2020-10-03 DIAGNOSIS — Z79899 Other long term (current) drug therapy: Secondary | ICD-10-CM | POA: Diagnosis not present

## 2020-10-08 DIAGNOSIS — Z0100 Encounter for examination of eyes and vision without abnormal findings: Secondary | ICD-10-CM | POA: Diagnosis not present

## 2020-10-21 DIAGNOSIS — Z779 Other contact with and (suspected) exposures hazardous to health: Secondary | ICD-10-CM | POA: Diagnosis not present

## 2020-10-21 DIAGNOSIS — R87610 Atypical squamous cells of undetermined significance on cytologic smear of cervix (ASC-US): Secondary | ICD-10-CM | POA: Diagnosis not present

## 2020-11-04 ENCOUNTER — Other Ambulatory Visit: Payer: Self-pay

## 2020-11-04 ENCOUNTER — Encounter (HOSPITAL_COMMUNITY): Payer: Self-pay

## 2020-11-04 ENCOUNTER — Encounter (HOSPITAL_COMMUNITY)
Admission: RE | Admit: 2020-11-04 | Discharge: 2020-11-04 | Disposition: A | Discharge status: Home / Self Care | Payer: Medicare HMO | Source: Ambulatory Visit | Attending: Internal Medicine | Admitting: Internal Medicine

## 2020-11-04 VITALS — BP 114/60 | HR 86 | Ht 63.0 in | Wt 197.1 lb

## 2020-11-04 DIAGNOSIS — M359 Systemic involvement of connective tissue, unspecified: Secondary | ICD-10-CM | POA: Diagnosis not present

## 2020-11-04 DIAGNOSIS — J8489 Other specified interstitial pulmonary diseases: Secondary | ICD-10-CM | POA: Insufficient documentation

## 2020-11-04 NOTE — Progress Notes (Signed)
Cardiac/Pulmonary Rehab Medication Review by a Pharmacist  Does the patient  feel that his/her medications are working for him/her?  yes  Has the patient been experiencing any side effects to the medications prescribed?  no  Does the patient measure his/her own blood pressure or blood glucose at home?  BP stable   Does the patient have any problems obtaining medications due to transportation or finances?   Patient's MD recommended probiotics $60/month. Discussed with her the different probiotics/cheaper alternatives to try.  Understanding of regimen: excellent Understanding of indications: excellent Potential of compliance: excellent  Questions asked to Determine Patient Understanding of Medication Regimen:  1. What is the name of the medication?  2. What is the medication used for?  3. When should it be taken?  4. How much should be taken?  5. How will you take it?  6. What side effects should you report?  Understanding Defined as: Excellent: All questions above are correct Good: Questions 1-4 are correct Fair: Questions 1-2 are correct  Poor: 1 or none of the above questions are correct   Pharmacist comments: Overall, patient has great understanding of regimen and high compliance.  Patient has no issues but discussed probiotic alternatives to her current one.    Ramond Craver 11/04/2020 1:35 PM

## 2020-11-04 NOTE — Progress Notes (Signed)
Pulmonary Individual Treatment Plan  Patient Details  Name: [name] MRN: [id number] Date of Birth: 14-Jun-1946 Referring Provider:     PULMONARY REHAB OTHER RESP ORIENTATION from 11/04/2020 in [facility]  Referring Provider Dr. Chase Caller      Initial Encounter Date:    [facility] from 11/04/2020 in [facility]  Date 11/04/20      Visit Diagnosis: Interstitial lung disease due to connective tissue disease ([place])  Patient's Home Medications on Admission:   Current Outpatient Medications:  .  fexofenadine-pseudoephedrine (ALLEGRA-D) 60-120 MG 12 hr tablet, Take 1 tablet by mouth daily., Disp: , Rfl:  .  acetaminophen (TYLENOL) 325 MG tablet, Take 2 tablets (650 mg total) by mouth every 6 (six) hours as needed for mild pain (or Fever >/= 101)., Disp: 40 tablet, Rfl: 0 .  albuterol (VENTOLIN HFA) 108 (90 Base) MCG/ACT inhaler, Inhale 2 puffs into the lungs every 6 (six) hours as needed for wheezing., Disp: 18 g, Rfl: 3 .  amLODipine (NORVASC) 2.5 MG tablet, TAKE 1 TABLET EVERY DAY, Disp: 90 tablet, Rfl: 2 .  aspirin 81 MG chewable tablet, Chew 81 mg by mouth daily. Start on 10/26/16 after completion of Xarelto, Disp: , Rfl:  .  furosemide (LASIX) 40 MG tablet, Take 2 tablets (80 mg total) by mouth daily., Disp: 180 tablet, Rfl: 3 .  hydroxychloroquine (PLAQUENIL) 200 MG tablet, Take 400 mg by mouth daily., Disp: , Rfl:  .  pantoprazole (PROTONIX) 40 MG tablet, Take 40 mg by mouth 2 (two) times daily., Disp: , Rfl:  .  predniSONE (DELTASONE) 5 MG tablet, Take 5 mg by mouth daily with breakfast. , Disp: , Rfl:  .  triamcinolone (NASACORT ALLERGY 24HR) 55 MCG/ACT AERO nasal inhaler, Place 2 sprays into the nose daily., Disp: , Rfl:  .  zolpidem (AMBIEN) 10 MG tablet, Take 5-10 mg by mouth at bedtime as needed for sleep. , Disp: , Rfl:   Past Medical History: Past Medical History:  Diagnosis Date  . Diastolic  congestive heart failure ([place])    02/05/15  states no fluid in extremities, breathing same as has been  . GERD (gastroesophageal reflux disease)   . History of blood transfusion   . Hx of cholecystectomy   . Interstitial lung disease ([place])   . Osteoarthritis   . Pulmonary fibrosis ([place])   . Raynaud disease   . Rheumatoid arthritis(714.0)   . Scleroderma ([place])   . Seasonal allergies   . Shortness of breath dyspnea    states normal for her  . Systemic sclerosis ([facility])     Tobacco Use: Social History   Tobacco Use  Smoking Status Former Smoker  . Packs/day: 1.00  . Years: 20.00  . Pack years: 20.00  . Types: Cigarettes  . Quit date: 11/30/1988  . Years since quitting: 31.9  Smokeless Tobacco Never Used    Labs: Recent Review Scientist, physiological    Labs for ITP Cardiac and Pulmonary Rehab Latest Ref Rng & Units 08/18/2008 03/15/2014 03/15/2014   PHART 7.35 - 7.45 7.446(H) 7.396 -   PCO2ART 35 - 45 mmHg 41.4 40.0 -   HCO3 20.0 - 24.0 mEq/L 28.5(H) 24.5(H) 27.3(H)   TCO2 0 - 100 mmol/L _0 O2SAT % 97.0 93.0 72.0      Capillary Blood Glucose: No results found for: GLUCAP   Pulmonary Assessment Scores:  Pulmonary Assessment Scores    Row Name 11/04/20 1309  ADL UCSD   ADL Phase Entry     SOB Score total 46       CAT Score   CAT Score 19       mMRC Score   mMRC Score 2           UCSD: Self-administered rating of dyspnea associated with activities of daily living (ADLs) 6-point scale (0 = "not at all" to 5 = "maximal or unable to do because of breathlessness")  Scoring Scores range from 0 to 120.  Minimally important difference is 5 units  CAT: CAT can identify the health impairment of COPD patients and is better correlated with disease progression.  CAT has a scoring range of zero to 40. The CAT score is classified into four groups of low (less than 10), medium (10 - 20), high (21-30) and very high (31-40) based on the impact level of disease on health  status. A CAT score over 10 suggests significant symptoms.  A worsening CAT score could be explained by an exacerbation, poor medication adherence, poor inhaler technique, or progression of COPD or comorbid conditions.  CAT MCID is 2 points  mMRC: mMRC (Modified Medical Research Council) Dyspnea Scale is used to assess the degree of baseline functional disability in patients of respiratory disease due to dyspnea. No minimal important difference is established. A decrease in score of 1 point or greater is considered a positive change.   Pulmonary Function Assessment:  Pulmonary Function Assessment - 11/04/20 1255      Breath   Shortness of Breath Yes;Limiting activity           Exercise Target Goals: Exercise Program Goal: Individual exercise prescription set using results from initial 6 min walk test and THRR while considering  patient's activity barriers and safety.   Exercise Prescription Goal: Initial exercise prescription builds to 30-45 minutes a day of aerobic activity, 2-3 days per week.  Home exercise guidelines will be given to patient during program as part of exercise prescription that the participant will acknowledge.  Activity Barriers & Risk Stratification:  Activity Barriers & Cardiac Risk Stratification - 11/04/20 1252      Activity Barriers & Cardiac Risk Stratification   Activity Barriers Arthritis;Back Problems;Left Knee Replacement;Right Knee Replacement;Neck/Spine Problems;Deconditioning;Muscular Weakness;Shortness of Breath;Decreased Ventricular Function    Cardiac Risk Stratification High           6 Minute Walk:  6 Minute Walk    Row Name 11/04/20 1417         6 Minute Walk   Phase Initial     Distance 800 feet     Walk Time 6 minutes     # of Rest Breaks 0     MPH 1.5     METS 1.8     RPE 11     Perceived Dyspnea  13     VO2 Peak 6.45     Symptoms No     Resting HR 86 bpm     Resting BP 114/60     Resting Oxygen Saturation  96 %      Exercise Oxygen Saturation  during 6 min walk 86 %  dropped a little lower, however puls ox didnt want to read her while she was walking. When she stopped it immediatley went into the 90's     Max Ex. HR 107 bpm     Max Ex. BP 170/72     2 Minute Post BP 136/78       Interval HR  1 Minute HR 100     2 Minute HR 100     3 Minute HR 102     4 Minute HR 104     5 Minute HR 107     6 Minute HR 106     2 Minute Post HR 91     Interval Heart Rate? Yes       Interval Oxygen   Interval Oxygen? Yes     Baseline Oxygen Saturation % 96 %     1 Minute Oxygen Saturation % 90 %     1 Minute Liters of Oxygen 0 L     2 Minute Oxygen Saturation % 91 %     2 Minute Liters of Oxygen 0 L     3 Minute Oxygen Saturation % 86 %     3 Minute Liters of Oxygen 0 L     4 Minute Oxygen Saturation % 87 %     4 Minute Liters of Oxygen 0 L     5 Minute Oxygen Saturation % 87 %     5 Minute Liters of Oxygen 0 L     6 Minute Oxygen Saturation % 89 %     6 Minute Liters of Oxygen 0 L     2 Minute Post Oxygen Saturation % 97 %     2 Minute Post Liters of Oxygen 0 L            Oxygen Initial Assessment:  Oxygen Initial Assessment - 11/04/20 1427      Initial 6 min Walk   Oxygen Used None           Oxygen Re-Evaluation:   Oxygen Discharge (Final Oxygen Re-Evaluation):   Initial Exercise Prescription:  Initial Exercise Prescription - 11/04/20 1400      Date of Initial Exercise RX and Referring Provider   Date 11/04/20    Referring Provider Dr. Chase Caller    Expected Discharge Date 03/11/21      NuStep   Level 1    SPM 60    Minutes 17      Recumbant Elliptical   Level 1    RPM 60    Minutes 22      Prescription Details   Duration Progress to 30 minutes of continuous aerobic without signs/symptoms of physical distress      Intensity   THRR 40-80% of Max Heartrate 59-118    Ratings of Perceived Exertion 11-13    Perceived Dyspnea 0-4      Progression   Progression Continue  progressive overload as per policy without signs/symptoms or physical distress.      Resistance Training   Training Prescription Yes    Weight 2    Reps 10-15           Perform Capillary Blood Glucose checks as needed.  Exercise Prescription Changes:   Exercise Comments:   Exercise Goals and Review:  Exercise Goals    Row Name 11/04/20 1425             Exercise Goals   Increase Physical Activity Yes       Intervention Provide advice, education, support and counseling about physical activity/exercise needs.;Develop an individualized exercise prescription for aerobic and resistive training based on initial evaluation findings, risk stratification, comorbidities and participant's personal goals.       Expected Outcomes Short Term: Attend rehab on a regular basis to increase amount of physical activity.;Long Term: Add in home exercise to make exercise  part of routine and to increase amount of physical activity.;Long Term: Exercising regularly at least 3-5 days a week.       Increase Strength and Stamina Yes       Intervention Provide advice, education, support and counseling about physical activity/exercise needs.;Develop an individualized exercise prescription for aerobic and resistive training based on initial evaluation findings, risk stratification, comorbidities and participant's personal goals.       Expected Outcomes Short Term: Increase workloads from initial exercise prescription for resistance, speed, and METs.;Short Term: Perform resistance training exercises routinely during rehab and add in resistance training at home;Long Term: Improve cardiorespiratory fitness, muscular endurance and strength as measured by increased METs and functional capacity (6MWT)       Able to understand and use rate of perceived exertion (RPE) scale Yes       Intervention Provide education and explanation on how to use RPE scale       Expected Outcomes Short Term: Able to use RPE daily in rehab  to express subjective intensity level;Long Term:  Able to use RPE to guide intensity level when exercising independently       Able to understand and use Dyspnea scale Yes       Intervention Provide education and explanation on how to use Dyspnea scale       Expected Outcomes Short Term: Able to use Dyspnea scale daily in rehab to express subjective sense of shortness of breath during exertion;Long Term: Able to use Dyspnea scale to guide intensity level when exercising independently       Knowledge and understanding of Target Heart Rate Range (THRR) Yes       Intervention Provide education and explanation of THRR including how the numbers were predicted and where they are located for reference       Expected Outcomes Short Term: Able to state/look up THRR;Long Term: Able to use THRR to govern intensity when exercising independently;Short Term: Able to use daily as guideline for intensity in rehab       Able to check pulse independently Yes       Intervention Provide education and demonstration on how to check pulse in carotid and radial arteries.;Review the importance of being able to check your own pulse for safety during independent exercise       Expected Outcomes Short Term: Able to explain why pulse checking is important during independent exercise;Long Term: Able to check pulse independently and accurately       Understanding of Exercise Prescription Yes       Intervention Provide education, explanation, and written materials on patient's individual exercise prescription       Expected Outcomes Short Term: Able to explain program exercise prescription;Long Term: Able to explain home exercise prescription to exercise independently              Exercise Goals Re-Evaluation :   Discharge Exercise Prescription (Final Exercise Prescription Changes):   Nutrition:  Target Goals: Understanding of nutrition guidelines, daily intake of sodium <1576m, cholesterol <2025m calories 30% from fat  and 7% or less from saturated fats, daily to have 5 or more servings of fruits and vegetables.  Biometrics:  Pre Biometrics - 11/04/20 1426      Pre Biometrics   Height _0  (1.6 m)    Weight 197 lb 1.5 oz (89.4 kg)    Waist Circumference 42.5 inches    Hip Circumference 47 inches    Waist to Hip Ratio 0.9 %    BMI (  Calculated) 34.92    Triceps Skinfold 32 mm    % Body Fat 46.7 %    Grip Strength 18.8 kg    Flexibility 20.5 in    Single Leg Stand 2.17 seconds            Nutrition Therapy Plan and Nutrition Goals:   Nutrition Assessments:  Nutrition Assessments - 11/04/20 1317      MEDFICTS Scores   Pre Score 43          MEDIFICTS Score Key:  ?70 Need to make dietary changes   40-70 Heart Healthy Diet  ? 40 Therapeutic Level Cholesterol Diet   Picture Your Plate Scores:  <80 Unhealthy dietary pattern with much room for improvement.  41-50 Dietary pattern unlikely to meet recommendations for good health and room for improvement.  51-60 More healthful dietary pattern, with some room for improvement.   >60 Healthy dietary pattern, although there may be some specific behaviors that could be improved.    Nutrition Goals Re-Evaluation:   Nutrition Goals Discharge (Final Nutrition Goals Re-Evaluation):   Psychosocial: Target Goals: Acknowledge presence or absence of significant depression and/or stress, maximize coping skills, provide positive support system. Participant is able to verbalize types and ability to use techniques and skills needed for reducing stress and depression.  Initial Review & Psychosocial Screening:  Initial Psych Review & Screening - 11/04/20 1256      Initial Review   Current issues with Current Stress Concerns    Source of Stress Concerns Family    Comments Husband has declined in terms of his cognitive ability due a stroke. 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 She feels as if she needs to be there for him even though her health is declining as well.      Family Dynamics   Good Support System? No    Strains Illness and family care strain    Comments Her husband is there for her physically but he is not a Dealer. Due to his cognitive decline he cannot give her the support she needs. She is unsure if he understands the severity of her health issues. Her daughter is supportive but lives in [place]. She only sees her about once a year but they text and call each other almost every day.      Barriers   Psychosocial barriers to participate in program The patient should benefit from training in stress management and relaxation.      Screening Interventions   Interventions Encouraged to exercise    Expected Outcomes Long Term Goal: Stressors or current issues are controlled or eliminated.;Long Term goal: The participant improves quality of Life and PHQ9 Scores as seen by post scores and/or verbalization of changes;Short Term goal: Identification and review with participant of any Quality of Life or Depression concerns found by scoring the questionnaire.           Quality of Life Scores:  Scores of 19 and below usually indicate a poorer quality of life in these areas.  A difference of  2-3 points is a clinically meaningful difference.  A difference of 2-3 points in the total score of the Quality of Life Index has been associated with significant improvement in overall quality of life, self-image, physical symptoms, and general health in studies assessing change in quality of life.   PHQ-9: Recent Review Flowsheet Data    Depression screen [facility] 2/9 11/04/2020  Decreased Interest 2    Down, Depressed, Hopeless 1   PHQ - 2 Score 3   Altered sleeping 1   Tired, decreased energy 1   Change in appetite 1   Feeling bad or failure about yourself  0   Trouble concentrating 0   Moving slowly or fidgety/restless 0   Suicidal thoughts 0   PHQ-9  Score 6   Difficult doing work/chores Somewhat difficult     Interpretation of Total Score  Total Score Depression Severity:  1-4 = Minimal depression, 5-9 = Mild depression, 10-14 = Moderate depression, 15-19 = Moderately severe depression, 20-27 = Severe depression   Psychosocial Evaluation and Intervention:   Psychosocial Re-Evaluation:   Psychosocial Discharge (Final Psychosocial Re-Evaluation):    Education: Education Goals: Education classes will be provided on a weekly basis, covering required topics. Participant will state understanding/return demonstration of topics presented.  Learning Barriers/Preferences:  Learning Barriers/Preferences - 11/04/20 1300      Learning Barriers/Preferences   Learning Barriers None    Learning Preferences Skilled Demonstration;Individual Instruction;Pictoral;Written Material           Education Topics: How Lungs Work and Diseases: - Discuss the anatomy of the lungs and diseases that can affect the lungs, such as COPD.   Exercise: -Discuss the importance of exercise, FITT principles of exercise, normal and abnormal responses to exercise, and how to exercise safely.   Environmental Irritants: -Discuss types of environmental irritants and how to limit exposure to environmental irritants.   Meds/Inhalers and oxygen: - Discuss respiratory medications, definition of an inhaler and oxygen, and the proper way to use an inhaler and oxygen.   Energy Saving Techniques: - Discuss methods to conserve energy and decrease shortness of breath when performing activities of daily living.    Bronchial Hygiene / Breathing Techniques: - Discuss breathing mechanics, pursed-lip breathing technique,  proper posture, effective ways to clear airways, and other functional breathing techniques   Cleaning Equipment: - Provides group verbal and written instruction about the health risks of elevated stress, cause of high stress, and healthy ways to  reduce stress.   Nutrition I: Fats: - Discuss the types of cholesterol, what cholesterol does to the body, and how cholesterol levels can be controlled.   Nutrition II: Labels: -Discuss the different components of food labels and how to read food labels.   Respiratory Infections: - Discuss the signs and symptoms of respiratory infections, ways to prevent respiratory infections, and the importance of seeking medical treatment when having a respiratory infection.   Stress I: Signs and Symptoms: - Discuss the causes of stress, how stress may lead to anxiety and depression, and ways to limit stress.   Stress II: Relaxation: -Discuss relaxation techniques to limit stress.   Oxygen for Home/Travel: - Discuss how to prepare for travel when on oxygen and proper ways to transport and store oxygen to ensure safety.   Knowledge Questionnaire Score:  Knowledge Questionnaire Score - 11/04/20 1309      Knowledge Questionnaire Score   Pre Score 15/18           Core Components/Risk Factors/Patient Goals at Admission:  Personal Goals and Risk Factors at Admission - 11/04/20 1317      Core Components/Risk Factors/Patient Goals on Admission    Weight Management Weight Loss;Yes    Intervention Weight Management: Develop a combined nutrition and exercise program designed to reach desired caloric intake, while maintaining appropriate intake of nutrient and fiber, sodium and fats, and appropriate energy expenditure required  for the weight goal.;Weight Management: Provide education and appropriate resources to help participant work on and attain dietary goals.;Weight Management/Obesity: Establish reasonable short term and long term weight goals.;Obesity: Provide education and appropriate resources to help participant work on and attain dietary goals.   would like to lose 20 lbs during the 18 week span of the program   Improve shortness of breath with ADL's Yes    Intervention Provide education,  individualized exercise plan and daily activity instruction to help decrease symptoms of SOB with activities of daily living.    Expected Outcomes Short Term: Improve cardiorespiratory fitness to achieve a reduction of symptoms when performing ADLs;Long Term: Be able to perform more ADLs without symptoms or delay the onset of symptoms    Heart Failure Yes    Intervention Provide a combined exercise and nutrition program that is supplemented with education, support and counseling about heart failure. Directed toward relieving symptoms such as shortness of breath, decreased exercise tolerance, and extremity edema.    Expected Outcomes Improve functional capacity of life;Short term: Attendance in program 2-3 days a week with increased exercise capacity. Reported lower sodium intake. Reported increased fruit and vegetable intake. Reports medication compliance.;Short term: Daily weights obtained and reported for increase. Utilizing diuretic protocols set by physician.;Long term: Adoption of self-care skills and reduction of barriers for early signs and symptoms recognition and intervention leading to self-care maintenance.    Stress Yes    Intervention Offer individual and/or small group education and counseling on adjustment to heart disease, stress management and health-related lifestyle change. Teach and support self-help strategies.;Refer participants experiencing significant psychosocial distress to appropriate mental health specialists for further evaluation and treatment. When possible, include family members and significant others in education/counseling sessions.    Expected Outcomes Short Term: Participant demonstrates changes in health-related behavior, relaxation and other stress management skills, ability to obtain effective social support, and compliance with psychotropic medications if prescribed.;Long Term: Emotional wellbeing is indicated by absence of clinically significant psychosocial distress  or social isolation.           Core Components/Risk Factors/Patient Goals Review:    Core Components/Risk Factors/Patient Goals at Discharge (Final Review):    ITP Comments:   Comments: Patient arrived for pulmonary rehab orientation at 1230. Patient was referred to [facility] by Dr. Chase Caller due to Interstitial lung disease due to connective tissue disease. During orientation advised patient on arrival and appointment times what to wear, what to do before, during and after exercise. Reviewed attendance and class policy.  Pt is scheduled to return Pulmonary Rehab on 11/07/2020 at 1045. Pt was advised to come to class 15 minutes before class starts.  Discussed RPE/Dpysnea scales. Patient participated in warm up stretches. Patient was able to complete 6 minute walk test. Patient was measured for the equipment. Discussed equipment safety with patient. Took patient pre-anthropometric measurements. Patient finished visit at 1400.

## 2020-11-05 NOTE — Telephone Encounter (Signed)
To: [facility] POOL    From: WYNEMA GAROUTTE    Created: 11/05/2020 11:20 AM     *-*-*This message was handled on 11/05/2020 2:01 PM by Roark Rufo P*-*-*  Dr, Tilden Fossa requested I come in for another 6 minute walk.  I had one done at [facility] pulmonary rehab yesterday (Dec. 5).  Also another one will be done on Thursday of this week.  Do I still need to come in and have the same thing done in your office?     Pt's pulmonary rehab data is able to be seen in her chart. MR, please advise on mychart message sent by pt which is posted above and advise if she still needs to have a 6 min walk performed at our office or if the one she has had at [facility] pulm rehab is okay.

## 2020-11-06 NOTE — Telephone Encounter (Signed)
I checked 2 recent notes and I do not see where I have asked for 6 min walk test. We will just do  A simple walk test as part of routine eval. She does not need 5md. I agree

## 2020-11-07 ENCOUNTER — Encounter (HOSPITAL_COMMUNITY)
Admission: RE | Admit: 2020-11-07 | Discharge: 2020-11-07 | Disposition: A | Discharge status: Home / Self Care | Payer: Medicare HMO | Source: Ambulatory Visit | Attending: Internal Medicine | Admitting: Internal Medicine

## 2020-11-07 ENCOUNTER — Other Ambulatory Visit: Payer: Self-pay

## 2020-11-07 DIAGNOSIS — M359 Systemic involvement of connective tissue, unspecified: Secondary | ICD-10-CM

## 2020-11-07 DIAGNOSIS — J8489 Other specified interstitial pulmonary diseases: Secondary | ICD-10-CM

## 2020-11-07 NOTE — Progress Notes (Signed)
Daily Session Note  Patient Details  Name: GLORIS SHIROMA MRN: [id number] Date of Birth: 10-24-1946 Referring Provider:   Flowsheet Row PULMONARY REHAB OTHER RESP ORIENTATION from 11/04/2020 in [place]  Referring Provider Dr. Chase Caller      Encounter Date: 11/07/2020  Check In:  Session Check In - 11/07/20 1045      Check-In   Supervising physician immediately available to respond to emergencies [facility] MD immediately available    Physician(s) Dr. Harl Bowie    Location [facility]    Staff Present Cathren Harsh, MS, Exercise Physiologist;Jaretssi Kraker Kris Mouton, MS, ACSM-CEP, Exercise Physiologist    Virtual Visit No    Medication changes reported     No    Fall or balance concerns reported    No    Tobacco Cessation No Change    Warm-up and Cool-down Performed on first and last piece of equipment    Resistance Training Performed Yes    VAD Patient? No    PAD/SET Patient? No      Pain Assessment   Currently in Pain? No/denies    Multiple Pain Sites No           Capillary Blood Glucose: No results found for this or any previous visit (from the past 24 hour(s)).    Social History   Tobacco Use  Smoking Status Former Smoker  . Packs/day: 1.00  . Years: 20.00  . Pack years: 20.00  . Types: Cigarettes  . Quit date: 11/30/1988  . Years since quitting: 31.9  Smokeless Tobacco Never Used    Goals Met:  Independence with exercise equipment Exercise tolerated well No report of cardiac concerns or symptoms Strength training completed today  Goals Unmet:  Not Applicable  Comments: checkout time is 1145   Dr. Kathie Dike is Medical Director for [facility].

## 2020-11-12 ENCOUNTER — Encounter (HOSPITAL_COMMUNITY): Payer: Medicare HMO

## 2020-11-14 ENCOUNTER — Encounter (HOSPITAL_COMMUNITY)
Admission: RE | Admit: 2020-11-14 | Discharge: 2020-11-14 | Disposition: A | Discharge status: Home / Self Care | Payer: Medicare HMO | Source: Ambulatory Visit | Attending: Internal Medicine | Admitting: Internal Medicine

## 2020-11-14 ENCOUNTER — Other Ambulatory Visit: Payer: Self-pay

## 2020-11-14 DIAGNOSIS — J8489 Other specified interstitial pulmonary diseases: Secondary | ICD-10-CM | POA: Diagnosis not present

## 2020-11-14 DIAGNOSIS — M359 Systemic involvement of connective tissue, unspecified: Secondary | ICD-10-CM

## 2020-11-14 NOTE — Progress Notes (Signed)
Pulmonary Individual Treatment Plan  Patient Details  Name: [name] MRN: [id number] Date of Birth: Dec 23, 1945 Referring Provider:   Flowsheet Row PULMONARY REHAB OTHER RESP ORIENTATION from 11/04/2020 in [facility]  Referring Provider Dr. Chase Caller      Initial Encounter Date:  Flowsheet Row PULMONARY REHAB OTHER RESP ORIENTATION from 11/04/2020 in [facility]  Date 11/04/20      Visit Diagnosis: Interstitial lung disease due to connective tissue disease ([place])  Patient's Home Medications on Admission:   Current Outpatient Medications:    acetaminophen (TYLENOL) 325 MG tablet, Take 2 tablets (650 mg total) by mouth every 6 (six) hours as needed for mild pain (or Fever >/= 101)., Disp: 40 tablet, Rfl: 0   albuterol (VENTOLIN HFA) 108 (90 Base) MCG/ACT inhaler, Inhale 2 puffs into the lungs every 6 (six) hours as needed for wheezing., Disp: 18 g, Rfl: 3   amLODipine (NORVASC) 2.5 MG tablet, TAKE 1 TABLET EVERY DAY, Disp: 90 tablet, Rfl: 2   aspirin 81 MG chewable tablet, Chew 81 mg by mouth daily. Start on 10/26/16 after completion of Xarelto, Disp: , Rfl:    fexofenadine-pseudoephedrine (ALLEGRA-D) 60-120 MG 12 hr tablet, Take 1 tablet by mouth daily., Disp: , Rfl:    furosemide (LASIX) 40 MG tablet, Take 2 tablets (80 mg total) by mouth daily., Disp: 180 tablet, Rfl: 3   hydroxychloroquine (PLAQUENIL) 200 MG tablet, Take 400 mg by mouth daily., Disp: , Rfl:    pantoprazole (PROTONIX) 40 MG tablet, Take 40 mg by mouth 2 (two) times daily., Disp: , Rfl:    predniSONE (DELTASONE) 5 MG tablet, Take 5 mg by mouth daily with breakfast. , Disp: , Rfl:    triamcinolone (NASACORT ALLERGY 24HR) 55 MCG/ACT AERO nasal inhaler, Place 2 sprays into the nose daily., Disp: , Rfl:    zolpidem (AMBIEN) 10 MG tablet, Take 5-10 mg by mouth at bedtime as needed for sleep. , Disp: , Rfl:   Past Medical History: Past Medical History:   Diagnosis Date   Diastolic congestive heart failure ([place])    02/05/15  states no fluid in extremities, breathing same as has been   GERD (gastroesophageal reflux disease)    History of blood transfusion    Hx of cholecystectomy    Interstitial lung disease (HCC)    Osteoarthritis    Pulmonary fibrosis (HCC)    Raynaud disease    Rheumatoid arthritis(714.0)    Scleroderma (HCC)    Seasonal allergies    Shortness of breath dyspnea    states normal for her   Systemic sclerosis ([place])     Tobacco Use: Social History   Tobacco Use  Smoking Status Former Smoker   Packs/day: 1.00   Years: 20.00   Pack years: 20.00   Types: Cigarettes   Quit date: 11/30/1988   Years since quitting: 31.9  Smokeless Tobacco Never Used    Labs: Recent [employer] for ITP Cardiac and Pulmonary Rehab Latest Ref Rng & Units 08/18/2008 03/15/2014 03/15/2014   PHART 7.350 - 7.450 7.446(H) 7.396 -   PCO2ART 35.0 - 45.0 mmHg 41.4 40.0 -   HCO3 20.0 - 24.0 mEq/L 28.5(H) 24.5(H) 27.3(H)   TCO2 0 - 100 mmol/L _0 O2SAT % 97.0 93.0 72.0      Capillary Blood Glucose: No results found for: GLUCAP   Pulmonary Assessment Scores:  Pulmonary Assessment Scores    Row Name 11/04/20 1309  ADL UCSD   ADL Phase Entry     SOB Score total 46           CAT Score   CAT Score 19           mMRC Score   mMRC Score 2           UCSD: Self-administered rating of dyspnea associated with activities of daily living (ADLs) 6-point scale (0 = "not at all" to 5 = "maximal or unable to do because of breathlessness")  Scoring Scores range from 0 to 120.  Minimally important difference is 5 units  CAT: CAT can identify the health impairment of COPD patients and is better correlated with disease progression.  CAT has a scoring range of zero to 40. The CAT score is classified into four groups of low (less than 10), medium (10 - 20), high (21-30) and very high (31-40)  based on the impact level of disease on health status. A CAT score over 10 suggests significant symptoms.  A worsening CAT score could be explained by an exacerbation, poor medication adherence, poor inhaler technique, or progression of COPD or comorbid conditions.  CAT MCID is 2 points  mMRC: mMRC (Modified Medical Research Council) Dyspnea Scale is used to assess the degree of baseline functional disability in patients of respiratory disease due to dyspnea. No minimal important difference is established. A decrease in score of 1 point or greater is considered a positive change.   Pulmonary Function Assessment:  Pulmonary Function Assessment - 11/04/20 1255      Breath   Shortness of Breath Yes;Limiting activity           Exercise Target Goals: Exercise Program Goal: Individual exercise prescription set using results from initial 6 min walk test and THRR while considering  patients activity barriers and safety.   Exercise Prescription Goal: Initial exercise prescription builds to 30-45 minutes a day of aerobic activity, 2-3 days per week.  Home exercise guidelines will be given to patient during program as part of exercise prescription that the participant will acknowledge.  Activity Barriers & Risk Stratification:  Activity Barriers & Cardiac Risk Stratification - 11/04/20 1252      Activity Barriers & Cardiac Risk Stratification   Activity Barriers Arthritis;Back Problems;Left Knee Replacement;Right Knee Replacement;Neck/Spine Problems;Deconditioning;Muscular Weakness;Shortness of Breath;Decreased Ventricular Function    Cardiac Risk Stratification High           6 Minute Walk:  6 Minute Walk    Row Name 11/04/20 1417         6 Minute Walk   Phase Initial     Distance 800 feet     Walk Time 6 minutes     # of Rest Breaks 0     MPH 1.5     METS 1.8     RPE 11     Perceived Dyspnea  13     VO2 Peak 6.45     Symptoms No     Resting HR 86 bpm     Resting BP 114/60      Resting Oxygen Saturation  96 %     Exercise Oxygen Saturation  during 6 min walk 86 %  dropped a little lower, however puls ox didnt want to read her while she was walking. When she stopped it immediatley went into the 90's     Max Ex. HR 107 bpm     Max Ex. BP 170/72     2 Minute Post BP 136/78  Interval HR   1 Minute HR 100     2 Minute HR 100     3 Minute HR 102     4 Minute HR 104     5 Minute HR 107     6 Minute HR 106     2 Minute Post HR 91     Interval Heart Rate? Yes           Interval Oxygen   Interval Oxygen? Yes     Baseline Oxygen Saturation % 96 %     1 Minute Oxygen Saturation % 90 %     1 Minute Liters of Oxygen 0 L     2 Minute Oxygen Saturation % 91 %     2 Minute Liters of Oxygen 0 L     3 Minute Oxygen Saturation % 86 %     3 Minute Liters of Oxygen 0 L     4 Minute Oxygen Saturation % 87 %     4 Minute Liters of Oxygen 0 L     5 Minute Oxygen Saturation % 87 %     5 Minute Liters of Oxygen 0 L     6 Minute Oxygen Saturation % 89 %     6 Minute Liters of Oxygen 0 L     2 Minute Post Oxygen Saturation % 97 %     2 Minute Post Liters of Oxygen 0 L            Oxygen Initial Assessment:  Oxygen Initial Assessment - 11/04/20 1427      Initial 6 min Walk   Oxygen Used None           Oxygen Re-Evaluation:  Oxygen Re-Evaluation    Row Name 11/07/20 1200             Program Oxygen Prescription   Program Oxygen Prescription None               Home Oxygen   Home Oxygen Device None       Home Exercise Oxygen Prescription None       Home Resting Oxygen Prescription None       Compliance with Home Oxygen Use Yes               Goals/Expected Outcomes   Short Term Goals To learn and exhibit compliance with exercise, home and travel O2 prescription;To learn and understand importance of monitoring SPO2 with pulse oximeter and demonstrate accurate use of the pulse oximeter.;To learn and understand importance of maintaining oxygen  saturations>88%;To learn and demonstrate proper pursed lip breathing techniques or other breathing techniques.;To learn and demonstrate proper use of respiratory medications       Long  Term Goals Exhibits compliance with exercise, home and travel O2 prescription;Verbalizes importance of monitoring SPO2 with pulse oximeter and return demonstration;Maintenance of O2 saturations>88%;Exhibits proper breathing techniques, such as pursed lip breathing or other method taught during program session;Compliance with respiratory medication;Demonstrates proper use of MDIs       Goals/Expected Outcomes Compliance              Oxygen Discharge (Final Oxygen Re-Evaluation):  Oxygen Re-Evaluation - 11/07/20 1200      Program Oxygen Prescription   Program Oxygen Prescription None      Home Oxygen   Home Oxygen Device None    Home Exercise Oxygen Prescription None    Home Resting Oxygen Prescription None    Compliance with Home Oxygen Use  Yes      Goals/Expected Outcomes   Short Term Goals To learn and exhibit compliance with exercise, home and travel O2 prescription;To learn and understand importance of monitoring SPO2 with pulse oximeter and demonstrate accurate use of the pulse oximeter.;To learn and understand importance of maintaining oxygen saturations>88%;To learn and demonstrate proper pursed lip breathing techniques or other breathing techniques.;To learn and demonstrate proper use of respiratory medications    Long  Term Goals Exhibits compliance with exercise, home and travel O2 prescription;Verbalizes importance of monitoring SPO2 with pulse oximeter and return demonstration;Maintenance of O2 saturations>88%;Exhibits proper breathing techniques, such as pursed lip breathing or other method taught during program session;Compliance with respiratory medication;Demonstrates proper use of MDIs    Goals/Expected Outcomes Compliance           Initial Exercise Prescription:  Initial Exercise  Prescription - 11/04/20 1400      Date of Initial Exercise RX and Referring Provider   Date 11/04/20    Referring Provider Dr. Chase Caller    Expected Discharge Date 03/11/21      NuStep   Level 1    SPM 60    Minutes 17      Recumbant Elliptical   Level 1    RPM 60    Minutes 22      Prescription Details   Duration Progress to 30 minutes of continuous aerobic without signs/symptoms of physical distress      Intensity   THRR 40-80% of Max Heartrate 59-118    Ratings of Perceived Exertion 11-13    Perceived Dyspnea 0-4      Progression   Progression Continue progressive overload as per policy without signs/symptoms or physical distress.      Resistance Training   Training Prescription Yes    Weight 2    Reps 10-15           Perform Capillary Blood Glucose checks as needed.  Exercise Prescription Changes:   Exercise Prescription Changes    Row Name 11/07/20 1200             Response to Exercise   Blood Pressure (Admit) 118/66       Blood Pressure (Exercise) 142/62       Blood Pressure (Exit) 120/60       Heart Rate (Admit) 88 bpm       Heart Rate (Exercise) 90 bpm       Heart Rate (Exit) 84 bpm       Rating of Perceived Exertion (Exercise) 12       Perceived Dyspnea (Exercise) 11       Duration Continue with 30 min of aerobic exercise without signs/symptoms of physical distress.       Intensity THRR unchanged               Progression   Progression Continue to progress workloads to maintain intensity without signs/symptoms of physical distress.               Resistance Training   Training Prescription Yes       Weight 2 lbs       Reps 10-15       Time 10 Minutes               NuStep   Level 1       SPM 64       Minutes 39       METs 1.8  Exercise Comments:   Exercise Comments    Row Name 11/07/20 1145           Exercise Comments Pt completed first exercise session today. She overall tolerated exercise well. Had to  switch equipment due to hip pain. She was also complaining of pain on her forehead above her eye.              Exercise Goals and Review:   Exercise Goals    Row Name 11/04/20 1425 11/07/20 1200           Exercise Goals   Increase Physical Activity Yes Yes      Intervention Provide advice, education, support and counseling about physical activity/exercise needs.;Develop an individualized exercise prescription for aerobic and resistive training based on initial evaluation findings, risk stratification, comorbidities and participant's personal goals. Provide advice, education, support and counseling about physical activity/exercise needs.;Develop an individualized exercise prescription for aerobic and resistive training based on initial evaluation findings, risk stratification, comorbidities and participant's personal goals.      Expected Outcomes Short Term: Attend rehab on a regular basis to increase amount of physical activity.;Long Term: Add in home exercise to make exercise part of routine and to increase amount of physical activity.;Long Term: Exercising regularly at least 3-5 days a week. Short Term: Attend rehab on a regular basis to increase amount of physical activity.;Long Term: Add in home exercise to make exercise part of routine and to increase amount of physical activity.;Long Term: Exercising regularly at least 3-5 days a week.      Increase Strength and Stamina Yes Yes      Intervention Provide advice, education, support and counseling about physical activity/exercise needs.;Develop an individualized exercise prescription for aerobic and resistive training based on initial evaluation findings, risk stratification, comorbidities and participant's personal goals. Provide advice, education, support and counseling about physical activity/exercise needs.;Develop an individualized exercise prescription for aerobic and resistive training based on initial evaluation findings, risk  stratification, comorbidities and participant's personal goals.      Expected Outcomes Short Term: Increase workloads from initial exercise prescription for resistance, speed, and METs.;Short Term: Perform resistance training exercises routinely during rehab and add in resistance training at home;Long Term: Improve cardiorespiratory fitness, muscular endurance and strength as measured by increased METs and functional capacity (6MWT) Short Term: Increase workloads from initial exercise prescription for resistance, speed, and METs.;Short Term: Perform resistance training exercises routinely during rehab and add in resistance training at home;Long Term: Improve cardiorespiratory fitness, muscular endurance and strength as measured by increased METs and functional capacity (6MWT)      Able to understand and use rate of perceived exertion (RPE) scale Yes Yes      Intervention Provide education and explanation on how to use RPE scale Provide education and explanation on how to use RPE scale      Expected Outcomes Short Term: Able to use RPE daily in rehab to express subjective intensity level;Long Term:  Able to use RPE to guide intensity level when exercising independently Short Term: Able to use RPE daily in rehab to express subjective intensity level;Long Term:  Able to use RPE to guide intensity level when exercising independently      Able to understand and use Dyspnea scale Yes Yes      Intervention Provide education and explanation on how to use Dyspnea scale Provide education and explanation on how to use Dyspnea scale      Expected Outcomes Short Term: Able to use Dyspnea scale daily  in rehab to express subjective sense of shortness of breath during exertion;Long Term: Able to use Dyspnea scale to guide intensity level when exercising independently Short Term: Able to use Dyspnea scale daily in rehab to express subjective sense of shortness of breath during exertion;Long Term: Able to use Dyspnea scale to  guide intensity level when exercising independently      Knowledge and understanding of Target Heart Rate Range (THRR) Yes Yes      Intervention Provide education and explanation of THRR including how the numbers were predicted and where they are located for reference Provide education and explanation of THRR including how the numbers were predicted and where they are located for reference      Expected Outcomes Short Term: Able to state/look up THRR;Long Term: Able to use THRR to govern intensity when exercising independently;Short Term: Able to use daily as guideline for intensity in rehab Short Term: Able to state/look up THRR;Long Term: Able to use THRR to govern intensity when exercising independently;Short Term: Able to use daily as guideline for intensity in rehab      Able to check pulse independently Yes Yes      Intervention Provide education and demonstration on how to check pulse in carotid and radial arteries.;Review the importance of being able to check your own pulse for safety during independent exercise Provide education and demonstration on how to check pulse in carotid and radial arteries.;Review the importance of being able to check your own pulse for safety during independent exercise      Expected Outcomes Short Term: Able to explain why pulse checking is important during independent exercise;Long Term: Able to check pulse independently and accurately Short Term: Able to explain why pulse checking is important during independent exercise;Long Term: Able to check pulse independently and accurately      Understanding of Exercise Prescription Yes Yes      Intervention Provide education, explanation, and written materials on patient's individual exercise prescription Provide education, explanation, and written materials on patient's individual exercise prescription      Expected Outcomes Short Term: Able to explain program exercise prescription;Long Term: Able to explain home exercise  prescription to exercise independently Short Term: Able to explain program exercise prescription;Long Term: Able to explain home exercise prescription to exercise independently             Exercise Goals Re-Evaluation :  Exercise Goals Re-Evaluation    Row Name 11/07/20 1200             Exercise Goal Re-Evaluation   Exercise Goals Review Increase Physical Activity;Increase Strength and Stamina;Able to understand and use rate of perceived exertion (RPE) scale;Knowledge and understanding of Target Heart Rate Range (THRR);Able to check pulse independently;Understanding of Exercise Prescription;Able to understand and use Dyspnea scale       Comments Patient has completed 1 exercise session. She was able to tolerate exercise well for the most part. She has had some orthopedic issues with hip pain. We had to move her from the elliptical to the NuStep because of her hip pain. She also was having pain above her eye, however it is thought to be because of the forehead sensor. She is currently exercising at 1.8 METs on the NuStep. Will continue to monitor and progress exercise as able.       Expected Outcomes Through exercise at rehab and a home exercise program, patient will reach their goals.              Discharge Exercise  Prescription (Final Exercise Prescription Changes):  Exercise Prescription Changes - 11/07/20 1200      Response to Exercise   Blood Pressure (Admit) 118/66    Blood Pressure (Exercise) 142/62    Blood Pressure (Exit) 120/60    Heart Rate (Admit) 88 bpm    Heart Rate (Exercise) 90 bpm    Heart Rate (Exit) 84 bpm    Rating of Perceived Exertion (Exercise) 12    Perceived Dyspnea (Exercise) 11    Duration Continue with 30 min of aerobic exercise without signs/symptoms of physical distress.    Intensity THRR unchanged      Progression   Progression Continue to progress workloads to maintain intensity without signs/symptoms of physical distress.      Resistance  Training   Training Prescription Yes    Weight 2 lbs    Reps 10-15    Time 10 Minutes      NuStep   Level 1    SPM 64    Minutes 39    METs 1.8           Nutrition:  Target Goals: Understanding of nutrition guidelines, daily intake of sodium <1556m, cholesterol <203m calories 30% from fat and 7% or less from saturated fats, daily to have 5 or more servings of fruits and vegetables.  Biometrics:  Pre Biometrics - 11/04/20 1426      Pre Biometrics   Height _0  (1.6 m)    Weight 89.4 kg    Waist Circumference 42.5 inches    Hip Circumference 47 inches    Waist to Hip Ratio 0.9 %    BMI (Calculated) 34.92    Triceps Skinfold 32 mm    % Body Fat 46.7 %    Grip Strength 18.8 kg    Flexibility 20.5 in    Single Leg Stand 2.17 seconds            Nutrition Therapy Plan and Nutrition Goals:  Nutrition Therapy & Goals - 11/11/20 1335      Personal Nutrition Goals   Comments Patient scored 43 on her medficts diet assessment. We will continue to provide nutritional education through hand-outs.      Intervention Plan   Intervention Nutrition handout(s) given to patient.           Nutrition Assessments:  Nutrition Assessments - 11/04/20 1317      MEDFICTS Scores   Pre Score 43          MEDIFICTS Score Key:  ?70 Need to make dietary changes   40-70 Heart Healthy Diet  ? 40 Therapeutic Level Cholesterol Diet   Picture Your Plate Scores:  <4<93nhealthy dietary pattern with much room for improvement.  41-50 Dietary pattern unlikely to meet recommendations for good health and room for improvement.  51-60 More healthful dietary pattern, with some room for improvement.   >60 Healthy dietary pattern, although there may be some specific behaviors that could be improved.    Nutrition Goals Re-Evaluation:   Nutrition Goals Discharge (Final Nutrition Goals Re-Evaluation):   Psychosocial: Target Goals: Acknowledge presence or absence of significant  depression and/or stress, maximize coping skills, provide positive support system. Participant is able to verbalize types and ability to use techniques and skills needed for reducing stress and depression.  Initial Review & Psychosocial Screening:  Initial Psych Review & Screening - 11/04/20 1256      Initial Review   Current issues with Current Stress Concerns    Source of Stress Concerns  Family    Comments Husband has declined in terms of his cognitive ability due a stroke. 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 She feels as if she needs to be there for him even though her health is declining as well.      Family Dynamics   Good Support System? No    Strains Illness and family care strain    Comments Her husband is there for her physically but he is not a Dealer. Due to his cognitive decline he cannot give her the support she needs. She is unsure if he understands the severity of her health issues. Her daughter is supportive but lives in [place]. She only sees her about once a year but they text and call each other almost every day.      Barriers   Psychosocial barriers to participate in program The patient should benefit from training in stress management and relaxation.      Screening Interventions   Interventions Encouraged to exercise    Expected Outcomes Long Term Goal: Stressors or current issues are controlled or eliminated.;Long Term goal: The participant improves quality of Life and PHQ9 Scores as seen by post scores and/or verbalization of changes;Short Term goal: Identification and review with participant of any Quality of Life or Depression concerns found by scoring the questionnaire.           Quality of Life Scores:  Quality of Life - 11/04/20 1544      Quality of Life   Select Quality of Life      Quality of Life Scores   Health/Function Pre 9.72 %    Socioeconomic Pre 10.83 %     Psych/Spiritual Pre 18 %    Family Pre 13.8 %    GLOBAL Pre 12.22 %          Scores of 19 and below usually indicate a poorer quality of life in these areas.  A difference of  2-3 points is a clinically meaningful difference.  A difference of 2-3 points in the total score of the Quality of Life Index has been associated with significant improvement in overall quality of life, self-image, physical symptoms, and general health in studies assessing change in quality of life.   PHQ-9: Recent Review Flowsheet Data    Depression screen [facility] 2/9 11/04/2020   Decreased Interest 2    Down, Depressed, Hopeless 1   PHQ - 2 Score 3   Altered sleeping 1   Tired, decreased energy 1   Change in appetite 1   Feeling bad or failure about yourself  0   Trouble concentrating 0   Moving slowly or fidgety/restless 0   Suicidal thoughts 0   PHQ-9 Score 6   Difficult doing work/chores Somewhat difficult     Interpretation of Total Score  Total Score Depression Severity:  1-4 = Minimal depression, 5-9 = Mild depression, 10-14 = Moderate depression, 15-19 = Moderately severe depression, 20-27 = Severe depression   Psychosocial Evaluation and Intervention:   Psychosocial Re-Evaluation:  Psychosocial Re-Evaluation    [place] Name 11/11/20 1336             Psychosocial Re-Evaluation   Current issues with Current Stress Concerns       Comments Patient's initial QOL socre was 12.22% overall and her PHQ-9 score was 6. She denies any depression or anxiety but does have stress concerns due to her husband's declining health but  says she is able to cope with her stress.  She does take Ambien for sleep. She demonstrates a very positive outlook and attitude. She is new to the program completed 2 sessions. Will continue to monitor.       Expected Outcomes Patient will have no psychosocial issues identified at discharge.               Initial Review   Source of Stress Concerns Family               Psychosocial Discharge (Final Psychosocial Re-Evaluation):  Psychosocial Re-Evaluation - 11/11/20 1336      Psychosocial Re-Evaluation   Current issues with Current Stress Concerns    Comments Patient's initial QOL socre was 12.22% overall and her PHQ-9 score was 6. She denies any depression or anxiety but does have stress concerns due to her husband's declining health but says she is able to cope with her stress.  She does take Ambien for sleep. She demonstrates a very positive outlook and attitude. She is new to the program completed 2 sessions. Will continue to monitor.    Expected Outcomes Patient will have no psychosocial issues identified at discharge.      Initial Review   Source of Stress Concerns Family            Education: Education Goals: Education classes will be provided on a weekly basis, covering required topics. Participant will state understanding/return demonstration of topics presented.  Learning Barriers/Preferences:  Learning Barriers/Preferences - 11/04/20 1300      Learning Barriers/Preferences   Learning Barriers None    Learning Preferences Skilled Demonstration;Individual Instruction;Pictoral;Written Material           Education Topics: How Lungs Work and Diseases: - Discuss the anatomy of the lungs and diseases that can affect the lungs, such as COPD.   Exercise: -Discuss the importance of exercise, FITT principles of exercise, normal and abnormal responses to exercise, and how to exercise safely.   Environmental Irritants: -Discuss types of environmental irritants and how to limit exposure to environmental irritants.   Meds/Inhalers and oxygen: - Discuss respiratory medications, definition of an inhaler and oxygen, and the proper way to use an inhaler and oxygen.   Energy Saving Techniques: - Discuss methods to conserve energy and decrease shortness of breath when performing activities of daily living.    Bronchial Hygiene /  Breathing Techniques: - Discuss breathing mechanics, pursed-lip breathing technique,  proper posture, effective ways to clear airways, and other functional breathing techniques   Cleaning Equipment: - Provides group verbal and written instruction about the health risks of elevated stress, cause of high stress, and healthy ways to reduce stress.   Nutrition I: Fats: - Discuss the types of cholesterol, what cholesterol does to the body, and how cholesterol levels can be controlled.   Nutrition II: Labels: -Discuss the different components of food labels and how to read food labels.   Respiratory Infections: - Discuss the signs and symptoms of respiratory infections, ways to prevent respiratory infections, and the importance of seeking medical treatment when having a respiratory infection.   Stress I: Signs and Symptoms: - Discuss the causes of stress, how stress may lead to anxiety and depression, and ways to limit stress. Flowsheet Row PULMONARY REHAB OTHER RESPIRATORY from 11/07/2020 in [place]  Date 11/07/20  Educator DF  Instruction Review Code 2- Demonstrated Understanding      Stress II: Relaxation: -Discuss relaxation techniques to limit stress.   Oxygen  for Home/Travel: - Discuss how to prepare for travel when on oxygen and proper ways to transport and store oxygen to ensure safety.   Knowledge Questionnaire Score:  Knowledge Questionnaire Score - 11/04/20 1309      Knowledge Questionnaire Score   Pre Score 15/18           Core Components/Risk Factors/Patient Goals at Admission:  Personal Goals and Risk Factors at Admission - 11/04/20 1317      Core Components/Risk Factors/Patient Goals on Admission    Weight Management Weight Loss;Yes    Intervention Weight Management: Develop a combined nutrition and exercise program designed to reach desired caloric intake, while maintaining appropriate intake of nutrient and fiber, sodium and  fats, and appropriate energy expenditure required for the weight goal.;Weight Management: Provide education and appropriate resources to help participant work on and attain dietary goals.;Weight Management/Obesity: Establish reasonable short term and long term weight goals.;Obesity: Provide education and appropriate resources to help participant work on and attain dietary goals.   would like to lose 20 lbs during the 18 week span of the program   Improve shortness of breath with ADL's Yes    Intervention Provide education, individualized exercise plan and daily activity instruction to help decrease symptoms of SOB with activities of daily living.    Expected Outcomes Short Term: Improve cardiorespiratory fitness to achieve a reduction of symptoms when performing ADLs;Long Term: Be able to perform more ADLs without symptoms or delay the onset of symptoms    Heart Failure Yes    Intervention Provide a combined exercise and nutrition program that is supplemented with education, support and counseling about heart failure. Directed toward relieving symptoms such as shortness of breath, decreased exercise tolerance, and extremity edema.    Expected Outcomes Improve functional capacity of life;Short term: Attendance in program 2-3 days a week with increased exercise capacity. Reported lower sodium intake. Reported increased fruit and vegetable intake. Reports medication compliance.;Short term: Daily weights obtained and reported for increase. Utilizing diuretic protocols set by physician.;Long term: Adoption of self-care skills and reduction of barriers for early signs and symptoms recognition and intervention leading to self-care maintenance.    Stress Yes    Intervention Offer individual and/or small group education and counseling on adjustment to heart disease, stress management and health-related lifestyle change. Teach and support self-help strategies.;Refer participants experiencing significant psychosocial  distress to appropriate mental health specialists for further evaluation and treatment. When possible, include family members and significant others in education/counseling sessions.    Expected Outcomes Short Term: Participant demonstrates changes in health-related behavior, relaxation and other stress management skills, ability to obtain effective social support, and compliance with psychotropic medications if prescribed.;Long Term: Emotional wellbeing is indicated by absence of clinically significant psychosocial distress or social isolation.           Core Components/Risk Factors/Patient Goals Review:   Goals and Risk Factor Review    Row Name 11/11/20 1339             Core Components/Risk Factors/Patient Goals Review   Review Patient is new to the program completing 1 session. She was referred to pulmonary rehab with [facility]. Her personal goals are to be able to walk long distance and to keep up with her grandchildren. Will continue to monitor as she works toward meeting these goals.       Expected Outcomes Patient will complete the program meeting both personal and program goals.  Core Components/Risk Factors/Patient Goals at Discharge (Final Review):   Goals and Risk Factor Review - 11/11/20 1339      Core Components/Risk Factors/Patient Goals Review   Review Patient is new to the program completing 1 session. She was referred to pulmonary rehab with [facility]. Her personal goals are to be able to walk long distance and to keep up with her grandchildren. Will continue to monitor as she works toward meeting these goals.    Expected Outcomes Patient will complete the program meeting both personal and program goals.           ITP Comments:   Comments: ITP REVIEW Pt is making expected progress toward pulmonary rehab goals after completing 1 sessions. Recommend continued exercise, life style modification, education, and utilization of breathing techniques to increase stamina  and strength and decrease shortness of breath with exertion.

## 2020-11-14 NOTE — Progress Notes (Signed)
Daily Session Note  Patient Details  Name: [name] MRN: [id number] Date of Birth: [birth year] Referring Provider:   Flowsheet Row PULMONARY REHAB OTHER RESP ORIENTATION from 11/04/2020 in [place]  Referring Provider Dr. Chase Caller      Encounter Date: 11/14/2020  Check In:  Session Check In - 11/14/20 1045      Check-In   Supervising physician immediately available to respond to emergencies [facility] MD immediately available    Physician(s) Dr. Domenic Polite    Location [facility]    Staff Present Cathren Harsh, MS, Exercise Physiologist;Debra Wynetta Emery, RN, BSN    Virtual Visit No    Medication changes reported     No    Fall or balance concerns reported    No    Tobacco Cessation No Change    Warm-up and Cool-down Performed as group-led instruction    Resistance Training Performed Yes    VAD Patient? No    PAD/SET Patient? No      Pain Assessment   Currently in Pain? No/denies    Multiple Pain Sites No           Capillary Blood Glucose: No results found for this or any previous visit (from the past 24 hour(s)).    Social History   Tobacco Use  Smoking Status Former Smoker  . Packs/day: 1.00  . Years: 20.00  . Pack years: 20.00  . Types: Cigarettes  . Quit date: 11/30/1988  . Years since quitting: 31.9  Smokeless Tobacco Never Used    Goals Met:  Independence with exercise equipment Exercise tolerated well No report of cardiac concerns or symptoms Strength training completed today  Goals Unmet:  Not Applicable  Comments: check out 1145   Dr. Kathie Dike is Medical Director for [facility].

## 2020-11-19 ENCOUNTER — Encounter (HOSPITAL_COMMUNITY): Payer: Medicare HMO

## 2020-11-21 ENCOUNTER — Encounter (HOSPITAL_COMMUNITY): Payer: Medicare HMO

## 2020-11-26 ENCOUNTER — Encounter (HOSPITAL_COMMUNITY): Payer: Medicare HMO

## 2020-11-28 ENCOUNTER — Ambulatory Visit: Payer: Medicare HMO | Admitting: Podiatry

## 2020-11-28 ENCOUNTER — Encounter (HOSPITAL_COMMUNITY): Payer: Medicare HMO

## 2020-11-28 ENCOUNTER — Ambulatory Visit (INDEPENDENT_AMBULATORY_CARE_PROVIDER_SITE_OTHER): Payer: Medicare HMO

## 2020-11-28 ENCOUNTER — Other Ambulatory Visit: Payer: Self-pay

## 2020-11-28 ENCOUNTER — Encounter: Payer: Self-pay | Admitting: Podiatry

## 2020-11-28 DIAGNOSIS — M898X7 Other specified disorders of bone, ankle and foot: Secondary | ICD-10-CM | POA: Diagnosis not present

## 2020-11-28 DIAGNOSIS — M778 Other enthesopathies, not elsewhere classified: Secondary | ICD-10-CM

## 2020-11-28 NOTE — Progress Notes (Signed)
She presents today chief complaint of a painful hallux right.  States that it hurts when pressed on top and on the side as she points to the fibular border the hallux left.  Objective: Vital signs are stable alert oriented x3.  There is mild erythema no edema cellulitis drainage or odor mild tenderness on palpation of the nail plate.  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  Radiographs taken today lateral view does not demonstrate a subungual exostosis.  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  Follow-up with me when she is ready to have a matrixectomy.

## 2020-12-03 ENCOUNTER — Encounter (HOSPITAL_COMMUNITY): Payer: Medicare HMO

## 2020-12-05 ENCOUNTER — Encounter (HOSPITAL_COMMUNITY): Payer: Medicare HMO

## 2020-12-06 NOTE — Progress Notes (Signed)
Discharge Progress Report  Patient Details  Name: [name] MRN: [id number] Date of Birth: September 09, 1946 Referring Provider:   Flowsheet Row PULMONARY REHAB OTHER RESP ORIENTATION from 11/04/2020 in [place]  Referring Provider Dr. Chase Caller       Number of Visits: 3  Reason for Discharge:  Early Exit:  hand and foot issues secondary to scleroderma  Smoking History:  Social History   Tobacco Use  Smoking Status Former Smoker  . Packs/day: 1.00  . Years: 20.00  . Pack years: 20.00  . Types: Cigarettes  . Quit date: 11/30/1988  . Years since quitting: 32.0  Smokeless Tobacco Never Used    Diagnosis:  Interstitial lung disease due to connective tissue disease ([place])  ADL UCSD:  Pulmonary Assessment Scores    Row Name 11/04/20 1309         ADL UCSD   ADL Phase Entry     SOB Score total 46           CAT Score   CAT Score 19           mMRC Score   mMRC Score 2            Initial Exercise Prescription:  Initial Exercise Prescription - 11/04/20 1400      Date of Initial Exercise RX and Referring Provider   Date 11/04/20    Referring Provider Dr. Chase Caller    Expected Discharge Date 03/11/21      NuStep   Level 1    SPM 60    Minutes 17      Recumbant Elliptical   Level 1    RPM 60    Minutes 22      Prescription Details   Duration Progress to 30 minutes of continuous aerobic without signs/symptoms of physical distress      Intensity   THRR 40-80% of Max Heartrate 59-118    Ratings of Perceived Exertion 11-13    Perceived Dyspnea 0-4      Progression   Progression Continue progressive overload as per policy without signs/symptoms or physical distress.      Resistance Training   Training Prescription Yes    Weight 2    Reps 10-15           Discharge Exercise Prescription (Final Exercise Prescription Changes):  Exercise Prescription Changes - 11/14/20 1045      Response to Exercise   Blood Pressure (Admit)  140/68    Blood Pressure (Exercise) 140/70    Blood Pressure (Exit) 130/80    Heart Rate (Admit) 87 bpm    Heart Rate (Exercise) 94 bpm    Heart Rate (Exit) 94 bpm    Oxygen Saturation (Admit) 97 %    Oxygen Saturation (Exercise) 97 %    Oxygen Saturation (Exit) 98 %    Rating of Perceived Exertion (Exercise) 13    Perceived Dyspnea (Exercise) 12    Duration Continue with 30 min of aerobic exercise without signs/symptoms of physical distress.    Intensity THRR unchanged      Progression   Progression Continue to progress workloads to maintain intensity without signs/symptoms of physical distress.      Resistance Training   Training Prescription Yes    Weight 2 lbs    Reps 10-15    Time 10 Minutes      NuStep   Level 1    SPM 66    Minutes 39    METs 1.8  Functional Capacity:  6 Minute Walk    Row Name 11/04/20 1417         6 Minute Walk   Phase Initial     Distance 800 feet     Walk Time 6 minutes     # of Rest Breaks 0     MPH 1.5     METS 1.8     RPE 11     Perceived Dyspnea  13     VO2 Peak 6.45     Symptoms No     Resting HR 86 bpm     Resting BP 114/60     Resting Oxygen Saturation  96 %     Exercise Oxygen Saturation  during 6 min walk 86 %  dropped a little lower, however puls ox didnt want to read her while she was walking. When she stopped it immediatley went into the 90's     Max Ex. HR 107 bpm     Max Ex. BP 170/72     2 Minute Post BP 136/78           Interval HR   1 Minute HR 100     2 Minute HR 100     3 Minute HR 102     4 Minute HR 104     5 Minute HR 107     6 Minute HR 106     2 Minute Post HR 91     Interval Heart Rate? Yes           Interval Oxygen   Interval Oxygen? Yes     Baseline Oxygen Saturation % 96 %     1 Minute Oxygen Saturation % 90 %     1 Minute Liters of Oxygen 0 L     2 Minute Oxygen Saturation % 91 %     2 Minute Liters of Oxygen 0 L     3 Minute Oxygen Saturation % 86 %     3 Minute Liters of  Oxygen 0 L     4 Minute Oxygen Saturation % 87 %     4 Minute Liters of Oxygen 0 L     5 Minute Oxygen Saturation % 87 %     5 Minute Liters of Oxygen 0 L     6 Minute Oxygen Saturation % 89 %     6 Minute Liters of Oxygen 0 L     2 Minute Post Oxygen Saturation % 97 %     2 Minute Post Liters of Oxygen 0 L            Psychological, QOL, Others - Outcomes: PHQ 2/9: Depression screen PHQ 2/9 11/04/2020  Decreased Interest 2  Down, Depressed, Hopeless 1  PHQ - 2 Score 3  Altered sleeping 1  Tired, decreased energy 1  Change in appetite 1  Feeling bad or failure about yourself  0  Trouble concentrating 0  Moving slowly or fidgety/restless 0  Suicidal thoughts 0  PHQ-9 Score 6  Difficult doing work/chores Somewhat difficult    Quality of Life:  Quality of Life - 11/04/20 1544      Quality of Life   Select Quality of Life      Quality of Life Scores   Health/Function Pre 9.72 %    Socioeconomic Pre 10.83 %    Psych/Spiritual Pre 18 %    Family Pre 13.8 %    GLOBAL Pre 12.22 %  Personal Goals: Goals established at orientation with interventions provided to work toward goal.  Personal Goals and Risk Factors at Admission - 11/04/20 1317      Core Components/Risk Factors/Patient Goals on Admission    Weight Management Weight Loss;Yes    Intervention Weight Management: Develop a combined nutrition and exercise program designed to reach desired caloric intake, while maintaining appropriate intake of nutrient and fiber, sodium and fats, and appropriate energy expenditure required for the weight goal.;Weight Management: Provide education and appropriate resources to help participant work on and attain dietary goals.;Weight Management/Obesity: Establish reasonable short term and long term weight goals.;Obesity: Provide education and appropriate resources to help participant work on and attain dietary goals.   would like to lose 20 lbs during the 18 week span of the  program   Improve shortness of breath with ADL's Yes    Intervention Provide education, individualized exercise plan and daily activity instruction to help decrease symptoms of SOB with activities of daily living.    Expected Outcomes Short Term: Improve cardiorespiratory fitness to achieve a reduction of symptoms when performing ADLs;Long Term: Be able to perform more ADLs without symptoms or delay the onset of symptoms    Heart Failure Yes    Intervention Provide a combined exercise and nutrition program that is supplemented with education, support and counseling about heart failure. Directed toward relieving symptoms such as shortness of breath, decreased exercise tolerance, and extremity edema.    Expected Outcomes Improve functional capacity of life;Short term: Attendance in program 2-3 days a week with increased exercise capacity. Reported lower sodium intake. Reported increased fruit and vegetable intake. Reports medication compliance.;Short term: Daily weights obtained and reported for increase. Utilizing diuretic protocols set by physician.;Long term: Adoption of self-care skills and reduction of barriers for early signs and symptoms recognition and intervention leading to self-care maintenance.    Stress Yes    Intervention Offer individual and/or small group education and counseling on adjustment to heart disease, stress management and health-related lifestyle change. Teach and support self-help strategies.;Refer participants experiencing significant psychosocial distress to appropriate mental health specialists for further evaluation and treatment. When possible, include family members and significant others in education/counseling sessions.    Expected Outcomes Short Term: Participant demonstrates changes in health-related behavior, relaxation and other stress management skills, ability to obtain effective social support, and compliance with psychotropic medications if prescribed.;Long Term:  Emotional wellbeing is indicated by absence of clinically significant psychosocial distress or social isolation.            Personal Goals Discharge:  Goals and Risk Factor Review    Row Name 11/11/20 1339 12/05/20 1533           Core Components/Risk Factors/Patient Goals Review   Personal Goals Review -- Improve shortness of breath with ADL's      Review Patient is new to the program completing 1 session. She was referred to pulmonary rehab with [facility]. Her personal goals are to be able to walk long distance and to keep up with her grandchildren. Will continue to monitor as she works toward meeting these goals. Patient has completed 1 sessions during this 30 day period due to problems with an ingrown toenail. Hopefully, patient wil be able to return to the program and complete. Will continue to monitor.      Expected Outcomes Patient will complete the program meeting both personal and program goals. Patient will complete the program meeting both personal and program goals.  Exercise Goals and Review:  Exercise Goals    Row Name 11/04/20 1425 11/07/20 1200           Exercise Goals   Increase Physical Activity Yes Yes      Intervention Provide advice, education, support and counseling about physical activity/exercise needs.;Develop an individualized exercise prescription for aerobic and resistive training based on initial evaluation findings, risk stratification, comorbidities and participant's personal goals. Provide advice, education, support and counseling about physical activity/exercise needs.;Develop an individualized exercise prescription for aerobic and resistive training based on initial evaluation findings, risk stratification, comorbidities and participant's personal goals.      Expected Outcomes Short Term: Attend rehab on a regular basis to increase amount of physical activity.;Long Term: Add in home exercise to make exercise part of routine and to increase amount  of physical activity.;Long Term: Exercising regularly at least 3-5 days a week. Short Term: Attend rehab on a regular basis to increase amount of physical activity.;Long Term: Add in home exercise to make exercise part of routine and to increase amount of physical activity.;Long Term: Exercising regularly at least 3-5 days a week.      Increase Strength and Stamina Yes Yes      Intervention Provide advice, education, support and counseling about physical activity/exercise needs.;Develop an individualized exercise prescription for aerobic and resistive training based on initial evaluation findings, risk stratification, comorbidities and participant's personal goals. Provide advice, education, support and counseling about physical activity/exercise needs.;Develop an individualized exercise prescription for aerobic and resistive training based on initial evaluation findings, risk stratification, comorbidities and participant's personal goals.      Expected Outcomes Short Term: Increase workloads from initial exercise prescription for resistance, speed, and METs.;Short Term: Perform resistance training exercises routinely during rehab and add in resistance training at home;Long Term: Improve cardiorespiratory fitness, muscular endurance and strength as measured by increased METs and functional capacity (6MWT) Short Term: Increase workloads from initial exercise prescription for resistance, speed, and METs.;Short Term: Perform resistance training exercises routinely during rehab and add in resistance training at home;Long Term: Improve cardiorespiratory fitness, muscular endurance and strength as measured by increased METs and functional capacity (6MWT)      Able to understand and use rate of perceived exertion (RPE) scale Yes Yes      Intervention Provide education and explanation on how to use RPE scale Provide education and explanation on how to use RPE scale      Expected Outcomes Short Term: Able to use RPE  daily in rehab to express subjective intensity level;Long Term:  Able to use RPE to guide intensity level when exercising independently Short Term: Able to use RPE daily in rehab to express subjective intensity level;Long Term:  Able to use RPE to guide intensity level when exercising independently      Able to understand and use Dyspnea scale Yes Yes      Intervention Provide education and explanation on how to use Dyspnea scale Provide education and explanation on how to use Dyspnea scale      Expected Outcomes Short Term: Able to use Dyspnea scale daily in rehab to express subjective sense of shortness of breath during exertion;Long Term: Able to use Dyspnea scale to guide intensity level when exercising independently Short Term: Able to use Dyspnea scale daily in rehab to express subjective sense of shortness of breath during exertion;Long Term: Able to use Dyspnea scale to guide intensity level when exercising independently      Knowledge and understanding of Target Heart Rate  Range (THRR) Yes Yes      Intervention Provide education and explanation of THRR including how the numbers were predicted and where they are located for reference Provide education and explanation of THRR including how the numbers were predicted and where they are located for reference      Expected Outcomes Short Term: Able to state/look up THRR;Long Term: Able to use THRR to govern intensity when exercising independently;Short Term: Able to use daily as guideline for intensity in rehab Short Term: Able to state/look up THRR;Long Term: Able to use THRR to govern intensity when exercising independently;Short Term: Able to use daily as guideline for intensity in rehab      Able to check pulse independently Yes Yes      Intervention Provide education and demonstration on how to check pulse in carotid and radial arteries.;Review the importance of being able to check your own pulse for safety during independent exercise Provide  education and demonstration on how to check pulse in carotid and radial arteries.;Review the importance of being able to check your own pulse for safety during independent exercise      Expected Outcomes Short Term: Able to explain why pulse checking is important during independent exercise;Long Term: Able to check pulse independently and accurately Short Term: Able to explain why pulse checking is important during independent exercise;Long Term: Able to check pulse independently and accurately      Understanding of Exercise Prescription Yes Yes      Intervention Provide education, explanation, and written materials on patient's individual exercise prescription Provide education, explanation, and written materials on patient's individual exercise prescription      Expected Outcomes Short Term: Able to explain program exercise prescription;Long Term: Able to explain home exercise prescription to exercise independently Short Term: Able to explain program exercise prescription;Long Term: Able to explain home exercise prescription to exercise independently             Exercise Goals Re-Evaluation:  Exercise Goals Re-Evaluation    Row Name 11/07/20 1200             Exercise Goal Re-Evaluation   Exercise Goals Review Increase Physical Activity;Increase Strength and Stamina;Able to understand and use rate of perceived exertion (RPE) scale;Knowledge and understanding of Target Heart Rate Range (THRR);Able to check pulse independently;Understanding of Exercise Prescription;Able to understand and use Dyspnea scale       Comments Patient has completed 1 exercise session. She was able to tolerate exercise well for the most part. She has had some orthopedic issues with hip pain. We had to move her from the elliptical to the NuStep because of her hip pain. She also was having pain above her eye, however it is thought to be because of the forehead sensor. She is currently exercising at 1.8 METs on the NuStep.  Will continue to monitor and progress exercise as able.       Expected Outcomes Through exercise at rehab and a home exercise program, patient will reach their goals.              Nutrition & Weight - Outcomes:  Pre Biometrics - 11/04/20 1426      Pre Biometrics   Height 5' 3" (1.6 m)    Weight 197 lb 1.5 oz (89.4 kg)    Waist Circumference 42.5 inches    Hip Circumference 47 inches    Waist to Hip Ratio 0.9 %    BMI (Calculated) 34.92    Triceps Skinfold 32 mm    %  Body Fat 46.7 %    Grip Strength 18.8 kg    Flexibility 20.5 in    Single Leg Stand 2.17 seconds            Nutrition:  Nutrition Therapy & Goals - 12/05/20 1530      Personal Nutrition Goals   Comments We will continue to provide nutritional education through hand-outs.      Intervention Plan   Intervention Nutrition handout(s) given to patient.           Nutrition Discharge:  Nutrition Assessments - 11/04/20 1317      MEDFICTS Scores   Pre Score 43           Education Questionnaire Score:  Knowledge Questionnaire Score - 11/04/20 1309      Knowledge Questionnaire Score   Pre Score 15/18           Bodhi was discharged from pulmonary rehab 12/06/2020 after 3 sessions. I spoke with her on the phone 12/06/2020 after she had been absent from class for 3 weeks. She stated that she had a painful boil on her hand that is a complication from scleroderma along with an ingrown toe nail. She stated that walking is currently difficult due to the ingrown toe nail. She stated that she probably would not be able to return to exercise for about 3 months. After discussion with her, we agreed that it would be best to discharge her from the program and have her seek another referral from Dr. Chase Caller at a later date once she is able to exercise again. Due to her short period of time in the program, no progress was observed.

## 2020-12-10 ENCOUNTER — Encounter (HOSPITAL_COMMUNITY): Payer: Medicare HMO

## 2020-12-10 ENCOUNTER — Other Ambulatory Visit: Payer: Self-pay

## 2020-12-10 ENCOUNTER — Ambulatory Visit (HOSPITAL_COMMUNITY)
Admission: RE | Admit: 2020-12-10 | Discharge: 2020-12-10 | Disposition: A | Discharge status: Home / Self Care | Payer: Medicare HMO | Source: Ambulatory Visit | Attending: Internal Medicine | Admitting: Internal Medicine

## 2020-12-10 DIAGNOSIS — R918 Other nonspecific abnormal finding of lung field: Secondary | ICD-10-CM | POA: Diagnosis not present

## 2020-12-10 DIAGNOSIS — J849 Interstitial pulmonary disease, unspecified: Secondary | ICD-10-CM

## 2020-12-10 DIAGNOSIS — I700 Atherosclerosis of aorta: Secondary | ICD-10-CM | POA: Diagnosis not present

## 2020-12-10 DIAGNOSIS — I2510 Atherosclerotic heart disease of native coronary artery without angina pectoris: Secondary | ICD-10-CM | POA: Diagnosis not present

## 2020-12-10 DIAGNOSIS — J84112 Idiopathic pulmonary fibrosis: Secondary | ICD-10-CM | POA: Diagnosis not present

## 2020-12-12 ENCOUNTER — Ambulatory Visit (INDEPENDENT_AMBULATORY_CARE_PROVIDER_SITE_OTHER): Payer: Medicare HMO | Admitting: Pulmonary Disease

## 2020-12-12 ENCOUNTER — Encounter (HOSPITAL_COMMUNITY): Payer: Medicare HMO

## 2020-12-12 ENCOUNTER — Other Ambulatory Visit: Payer: Self-pay

## 2020-12-12 ENCOUNTER — Encounter: Payer: Self-pay | Admitting: Pulmonary Disease

## 2020-12-12 ENCOUNTER — Ambulatory Visit (INDEPENDENT_AMBULATORY_CARE_PROVIDER_SITE_OTHER): Payer: Medicare HMO

## 2020-12-12 VITALS — BP 128/80 | HR 93 | Temp 98.0°F | Ht 63.0 in | Wt 197.6 lb

## 2020-12-12 DIAGNOSIS — M349 Systemic sclerosis, unspecified: Secondary | ICD-10-CM | POA: Diagnosis not present

## 2020-12-12 DIAGNOSIS — M359 Systemic involvement of connective tissue, unspecified: Secondary | ICD-10-CM | POA: Diagnosis not present

## 2020-12-12 DIAGNOSIS — J8489 Other specified interstitial pulmonary diseases: Secondary | ICD-10-CM

## 2020-12-12 DIAGNOSIS — Z0000 Encounter for general adult medical examination without abnormal findings: Secondary | ICD-10-CM | POA: Insufficient documentation

## 2020-12-12 NOTE — Assessment & Plan Note (Signed)
Remain up-to-date with vaccinations Work on increasing overall physical activity

## 2020-12-12 NOTE — Assessment & Plan Note (Signed)
Plan: Continue prednisone Continue Plaquenil

## 2020-12-12 NOTE — Progress Notes (Signed)
Six Minute Walk - 12/12/20 1423      Six Minute Walk   Medications taken before test (dose and time) Tylenol 330m, amlodipine 2.511m Allegra D, Lasix 8065mPlaquenil 400m35mrotonix 40mg19m prednisone 5mg w58m all taken around 9am this morning.    Supplemental oxygen during test? No    Lap distance in meters  34 meters    Laps Completed 8    Partial lap (in meters) 0 meters    Baseline BP (sitting) 128/80    Baseline Heartrate 88    Baseline Dyspnea (Borg Scale) 3    Baseline Fatigue (Borg Scale) 1    Baseline SPO2 97 %   on RA     End of Test Values    BP (sitting) 132/84    Heartrate 104    Dyspnea (Borg Scale) 4    Fatigue (Borg Scale) 2    SPO2 94 %   on RA     2 Minutes Post Walk Values   BP (sitting) 128/80    Heartrate 93    SPO2 97 %   on RA   Stopped or paused before six minutes? No      Interpretation   Distance completed 272 meters    Tech Comments: Patient was able to complete 6mw [id number] stopping. No O2 was needed during or after walk. Denied any chest pain, weakness or extreme SOB.

## 2020-12-12 NOTE — Assessment & Plan Note (Signed)
Plan: Continue Plaquenil Continue prednisone 6-minute walk today in clinic Will defer additional pulmonary function testing at this time as patient is clinically stable and she does not want to proceed forward with pulmonary function testing Discussed antifibrotic's, patient still would like to remain off of these, she will discuss at next office visit with Dr. Chase Caller Increase overall physical activity, can consider referral back to pulmonary rehab at next office visit

## 2020-12-12 NOTE — Progress Notes (Signed)
_0  ID: [name], female    DOB: 1946-01-15, 75 y.o.   MRN: [id number]  Chief Complaint  Patient presents with  . Follow-up    ILD, doing well    Referring provider: Asencion Noble, MD  HPI:  75 year old female former smoker followed in our office for interstitial lung disease  PMH: Screening systemic sclerosis, seasonal allergies, chronic rheumatic arthritis, Raynaud's disease, pulmonary hypertension Smoker/ Smoking History: Former smoker.  Quit 1990.  20-pack-year smoking Maintenance: Plaquenil, prednisone 5 mg Pt of: Dr. Chase Caller  12/12/2020  - Visit   75 year old female former smoker followed in our office for interstitial lung disease.  She is established with Dr. Chase Caller.  Patient's interstitial lung disease felt to be related to her systemic sclerosis.  She was last seen by Dr. Chase Caller in September/2021.  Plan of care at that point in time was as follows: Continue Plaquenil and daily prednisone as managed by rheumatology, antifibrotic's were discussed and patient declined starting due to concerns of antifibrotic side effects as well as stability of fibrosis.  Plan for patient obtain high-resolution CT chest in January/2022.  Patient completed that CT and the results are listed below:  12/10/2020-CT chest high-res- no significant interval change and moderate pulmonary fibrosis with apical to basal gradient consistent with UIP pattern, coronary artery disease, hepatic stenosis  Patient also completed a 6-minute walk today and did well.  Patient has no acute respiratory complaints today.  Questionaires / Pulmonary Flowsheets:   ACT:  No flowsheet data found.  MMRC: No flowsheet data found.  Epworth:  No flowsheet data found.  Tests:   FENO:  No results found for: NITRICOXIDE  PFT: PFT Results Latest Ref Rng & Units 07/23/2020 02/25/2018 09/22/2016 02/07/2015 03/07/2014 09/05/2013  FVC-Pre L 1.70 1.75 1.94 1.97 2.04 -  FVC-Predicted Pre % 60 60 68 68 70 68   FVC-Post L - 1.78 1.92 1.94 - 2.03  FVC-Predicted Post % - 61 68 67 - 69  Pre FEV1/FVC % % 73 77 74 76 75 79  Post FEV1/FCV % % - 78 79 80 - 82  FEV1-Pre L 1.25 1.35 1.44 1.50 1.54 1.57  FEV1-Predicted Pre % 59 62 67 68 69 70  FEV1-Post L - 1.38 1.51 1.56 - 1.66  DLCO uncorrected ml/min/mmHg 16.83 16.44 15.97 15.60 14.71 14.81  DLCO UNC% % 90 71 74 72 68 68  DLCO corrected ml/min/mmHg 16.83 - 15.87 - - -  DLCO COR %Predicted % 90 - 73 - - -  DLVA Predicted % 142 122 114 115 111 107  TLC L - - 3.40 2.92 - 3.00  TLC % Predicted % - - 71 61 - 63  RV % Predicted % - - 72 50 - 54    WALK:  SIX MIN WALK 12/12/2020 11/04/2020 08/08/2020 11/27/2019 09/22/2016 02/07/2015 07/10/2012  Medications Tylenol 334m, amlodipine 2.534m Allegra D, Lasix 8031mPlaquenil 400m36mrotonix 40mg41m prednisone 5mg w36m all taken around 9am this morning. - - - lasix, plaquenil, prednisone, protonix, bactrim--taken at 10:00 am Protonix, Prednisone, Lasix -  Supplimental Oxygen during Test? (L/min) No - - No No No -  Laps 8 - - - 5 5 -  Partial Lap (in Meters) 0 - - - 39 0 -  Baseline BP (sitting) 128/80 - - - 112/68 120/70 -  Baseline Heartrate 88 - - - 71 80 -  Baseline Dyspnea (Borg Scale) 3 - - - 1 6 -  Baseline Fatigue (Borg Scale) 1 - - -  0.5 0 -  Baseline SPO2 97 - - - 100 96 -  2 Minute Oxygen Saturation % - 91 - - - - -  2 Minute HR - 100 - - - - -  4 Minute Oxygen Saturation % - 87 - - - - -  4 Minute HR - 104 - - - - -  6 Minute Oxygen Saturation % - 89 - - - - -  6 Minute HR - 106 - - - - -  BP (sitting) 132/84 - - - 122/80 124/76 -  Heartrate 104 - - - 92 90 -  Dyspnea (Borg Scale) 4 - - - 2 7 -  Fatigue (Borg Scale) 2 - - - 3 3 -  SPO2 94 - - - 96 98 -  BP (sitting) 128/80 - - - 120/74 124/72 -  Heartrate 93 - - - 89 88 -  SPO2 97 - - - 97 98 -  Stopped or Paused before Six Minutes No - - - No No -  Interpretation - - - - - Hip pain -  Distance Completed 272 - - - 279 240 -  Distance  Completed 0 - - - - - -  Tech Comments: Patient was able to complete 72m without stopping. No O2 was needed during or after walk. Denied any chest pain, weakness or extreme SOB. - pt walked at even pace no signs of dyspnea no drop in oxygen stats Pt walked at a slow pace completing all required laps having complaints of mild-moderate SOB, hip pain, and also began to cough once finished with the walk. Pt c/o rt knee pain "it's bone on bone"- she is planning on having TKR in Nov 2017//lmr Pt did not complete a large number of laps due to bilateral knee pain. -  Provider Comments: - - - - - - 99%, 96%, 98%, 291 m. Well tolerated without desaturation.    Imaging: CT Chest High Resolution  Result Date: 12/10/2020 CLINICAL DATA:  Interstitial lung disease EXAM: CT CHEST WITHOUT CONTRAST TECHNIQUE: Multidetector CT imaging of the chest was performed following the standard protocol without intravenous contrast. High resolution imaging of the lungs, as well as inspiratory and expiratory imaging, was performed. COMPARISON:  12/19/2019, 03/16/2018, 03/12/2014, 07/04/2012 FINDINGS: Cardiovascular: Aortic atherosclerosis. Normal heart size. Three-vessel coronary artery calcifications. No pericardial effusion. Mediastinum/Nodes: No enlarged mediastinal, hilar, or axillary lymph nodes. Asymmetric enlargement and nodularity of the right lobe of the thyroid, unchanged compared to prior examinations dating back to 2015. Stability for greater than 5 years implies benignity; no biopsy or followup indicated (ref: J Am Coll Radiol. 2015 Feb;12(2): 143-50). Trachea, and esophagus demonstrate no significant findings. Lungs/Pleura: No significant interval change in moderate pulmonary fibrosis with apical to basal gradient, featuring irregular peripheral interstitial opacity, traction bronchiectasis, bronchiolectasis, and probable honeycombing at the bilateral lung bases. No significant air trapping on expiratory phase imaging. No  pleural effusion or pneumothorax. Upper Abdomen: Hepatic steatosis. Somewhat nodular contour of the liver, suggesting cirrhosis. Musculoskeletal: No chest wall mass or suspicious bone lesions identified. IMPRESSION: 1. No significant interval change in moderate pulmonary fibrosis with apical to basal gradient, featuring irregular peripheral interstitial opacity, traction bronchiectasis, bronchiolectasis, and probable honeycombing at the bilateral lung bases. Findings remain consistent with UIP pattern pulmonary fibrosis per consensus guidelines: Diagnosis of Idiopathic Pulmonary Fibrosis: An Official ATS/ERS/JRS/ALAT Clinical Practice Guideline. [place] Iss 5, [phone] Jul 31 2017. 2. Coronary artery disease. 3. Hepatic steatosis. Somewhat  nodular contour of the liver, suggesting cirrhosis. Correlate with biochemical findings. Aortic Atherosclerosis (ICD10-I70.0). Electronically Signed   By: Eddie Candle M.D.   On: 12/10/2020 15:00   DG Foot Complete Left  Result Date: 12/02/2020 Please see detailed radiograph report in office note.   Lab Results:  CBC    Component Value Date/Time   WBC 12.3 (H) 11/27/2019 1243   RBC 4.63 11/27/2019 1243   HGB 14.8 11/27/2019 1243   HCT 44.4 11/27/2019 1243   PLT 291.0 11/27/2019 1243   MCV 96.1 11/27/2019 1243   MCH 32.6 10/11/2016 1528   MCHC 33.2 11/27/2019 1243   RDW 12.7 11/27/2019 1243   LYMPHSABS 2.1 11/27/2019 1243   MONOABS 1.0 11/27/2019 1243   EOSABS 0.1 11/27/2019 1243   BASOSABS 0.1 11/27/2019 1243    BMET    Component Value Date/Time   NA 140 09/22/2019 1335   K 4.2 09/22/2019 1335   CL 97 09/22/2019 1335   CO2 29 09/22/2019 1335   GLUCOSE 133 (H) 09/22/2019 1335   GLUCOSE 129 (H) 10/11/2016 1528   BUN 14 09/22/2019 1335   CREATININE 0.80 09/22/2019 1335   CREATININE 0.76 08/14/2014 1604   CALCIUM 9.8 09/22/2019 1335   GFRNONAA 74 09/22/2019 1335   GFRNONAA 82 08/14/2014 1604   GFRAA 86 09/22/2019  1335   GFRAA >89 08/14/2014 1604    BNP    Component Value Date/Time   BNP 51.7 08/14/2014 1604    ProBNP No results found for: PROBNP  Specialty Problems      Pulmonary Problems   Lung disease with systemic sclerosis (HCC)    Overview:  RF+, scl70 +, xrays degenerative changes 01/2016 visit with Rheum ([place])> improved with ARAVA but having difficulty with bowels, felt ILD stable      Pulmonary fibrosis ([place])    Pulm fibrosis presumably d/t Scleroderma No esophageal involvement. + raynaud, sclerodactyly, telangiectasias,  pulm fibrosis.  No evident Pulm HTN at last check in 2010.  Last CT chest and PFT 10/12 [facility]  DLCO 50% 10/12 PFT 07/07/2012:  TLC 60%  DLCO 52%   CT Chest 07/07/2012: mild progression of pulmonary fibrosis compared to prior films Echo 07/07/2012  No pulm HTN Rheum: Dr Alanda Amass at [facility] PFTs 09/05/13:  FeV1 74% FVC 69% FeV1/FVC 82% TLC 63% DLCO 68%   PFTs 03/08/2014: DLCO 68%      Dyspnea   Interstitial lung disease due to connective tissue disease (HCC)      Allergies  Allergen Reactions  . Codeine     Neurological problems - hallucinations, "spacey"    Immunization History  Administered Date(s) Administered  . Influenza Split 08/01/2011, 09/02/2012, 08/30/2013, 08/29/2016  . Influenza, High Dose Seasonal PF 08/28/2017  . Influenza,inj,Quad PF,6+ Mos 09/03/2014  . Influenza-Unspecified 08/14/2014, 07/20/2019, 08/15/2019  . Moderna Sars-Covid-2 Vaccination 12/21/2018, 01/24/2020  . Pneumococcal Conjugate-13 09/19/2014, 11/28/2014  . Pneumococcal Polysaccharide-23 11/30/2008, 08/30/2009  . Zoster 11/30/2008    Past Medical History:  Diagnosis Date  . Diastolic congestive heart failure ([place])    02/05/15  states no fluid in extremities, breathing same as has been  . GERD (gastroesophageal reflux disease)   . History of blood transfusion   . Hx of cholecystectomy   . Interstitial lung disease ([place])   . Osteoarthritis   . Pulmonary fibrosis ([place])    . Raynaud disease   . Rheumatoid arthritis(714.0)   . Scleroderma ([place])   . Seasonal allergies   . Shortness of breath dyspnea  states normal for her  . Systemic sclerosis ([place])     Tobacco History: Social History   Tobacco Use  Smoking Status Former Smoker  . Packs/day: 1.00  . Years: 20.00  . Pack years: 20.00  . Types: Cigarettes  . Quit date: 11/30/1988  . Years since quitting: 32.0  Smokeless Tobacco Never Used   Counseling given: Not Answered   Continue to not smoke  Outpatient Encounter Medications as of 12/12/2020  Medication Sig  . acetaminophen (TYLENOL) 325 MG tablet Take 2 tablets (650 mg total) by mouth every 6 (six) hours as needed for mild pain (or Fever >/= 101).  Marland Kitchen albuterol (VENTOLIN HFA) 108 (90 Base) MCG/ACT inhaler Inhale 2 puffs into the lungs every 6 (six) hours as needed for wheezing.  Marland Kitchen amLODipine (NORVASC) 2.5 MG tablet TAKE 1 TABLET EVERY DAY  . aspirin 81 MG chewable tablet Chew 81 mg by mouth daily. Start on 10/26/16 after completion of Xarelto  . fexofenadine-pseudoephedrine (ALLEGRA-D) 60-120 MG 12 hr tablet Take 1 tablet by mouth daily.  . furosemide (LASIX) 40 MG tablet Take 2 tablets (80 mg total) by mouth daily.  . hydroxychloroquine (PLAQUENIL) 200 MG tablet Take 400 mg by mouth daily.  . pantoprazole (PROTONIX) 40 MG tablet Take 40 mg by mouth 2 (two) times daily.  . predniSONE (DELTASONE) 5 MG tablet Take 5 mg by mouth daily with breakfast.  . triamcinolone (NASACORT) 55 MCG/ACT AERO nasal inhaler Place 2 sprays into the nose daily.  Marland Kitchen zolpidem (AMBIEN) 10 MG tablet Take 5-10 mg by mouth at bedtime as needed for sleep.    No facility-administered encounter medications on file as of 12/12/2020.     Review of Systems  Review of Systems  Constitutional: Negative for activity change, fatigue and fever.  HENT: Negative for sinus pressure, sinus pain and sore throat.   Respiratory: Negative for cough, shortness of breath and wheezing.    Cardiovascular: Negative for chest pain and palpitations.  Gastrointestinal: Negative for diarrhea, nausea and vomiting.  Musculoskeletal: Negative for arthralgias.  Neurological: Negative for dizziness.  Psychiatric/Behavioral: Negative for sleep disturbance. The patient is not nervous/anxious.      Physical Exam  BP 128/80 (BP Location: Left Arm, Cuff Size: Normal)   Pulse 93   Temp 98 F (36.7 C) (Oral)   Ht _0  (1.6 m)   Wt 197 lb 9.6 oz (89.6 kg)   SpO2 97%   BMI 35.00 kg/m   Wt Readings from Last 5 Encounters:  12/12/20 197 lb 9.6 oz (89.6 kg)  11/04/20 197 lb 1.5 oz (89.4 kg)  08/08/20 198 lb 6.4 oz (90 kg)  03/18/20 200 lb (90.7 kg)  12/26/19 207 lb 9.6 oz (94.2 kg)    BMI Readings from Last 5 Encounters:  12/12/20 35.00 kg/m  11/04/20 34.91 kg/m  08/08/20 35.14 kg/m  03/18/20 35.43 kg/m  12/26/19 36.77 kg/m     Physical Exam Vitals and nursing note reviewed.  Constitutional:      General: She is not in acute distress.    Appearance: Normal appearance. She is normal weight.  HENT:     Head: Normocephalic and atraumatic.     Right Ear: Tympanic membrane, ear canal and external ear normal. There is no impacted cerumen.     Left Ear: Tympanic membrane, ear canal and external ear normal. There is no impacted cerumen.     Nose: Nose normal. No congestion.     Mouth/Throat:     Mouth: Mucous membranes  are moist.     Pharynx: Oropharynx is clear.  Eyes:     Pupils: Pupils are equal, round, and reactive to light.  Cardiovascular:     Rate and Rhythm: Normal rate and regular rhythm.     Pulses: Normal pulses.     Heart sounds: Normal heart sounds. No murmur heard.   Pulmonary:     Effort: Pulmonary effort is normal. No respiratory distress.     Breath sounds: No decreased air movement. Rales (bibasilar crackles - R>L ) present. No decreased breath sounds or wheezing.  Musculoskeletal:     Cervical back: Normal range of motion.  Skin:    General:  Skin is warm and dry.     Capillary Refill: Capillary refill takes less than 2 seconds.  Neurological:     General: No focal deficit present.     Mental Status: She is alert and oriented to person, place, and time. Mental status is at baseline.     Gait: Gait normal.  Psychiatric:        Mood and Affect: Mood normal.        Behavior: Behavior normal.        Thought Content: Thought content normal.        Judgment: Judgment normal.       Assessment & Plan:   Interstitial lung disease due to connective tissue disease ([place]) Plan: Continue Plaquenil Continue prednisone 6-minute walk today in clinic Will defer additional pulmonary function testing at this time as patient is clinically stable and she does not want to proceed forward with pulmonary function testing Discussed antifibrotic's, patient still would like to remain off of these, she will discuss at next office visit with Dr. Chase Caller Increase overall physical activity, can consider referral back to pulmonary rehab at next office visit  Healthcare maintenance Remain up-to-date with vaccinations Work on increasing overall physical activity  Systemic sclerosis Plan: Continue prednisone Continue Plaquenil    Return in about 4 months (around 04/11/2021), or if symptoms worsen or fail to improve, for Follow up with Dr. Purnell Shoemaker, ILD clinic - 91mn slot.   BLauraine Rinne NP 12/12/2020   This appointment required 32 minutes of patient care (this includes precharting, chart review, review of results, face-to-face care, etc.).

## 2020-12-12 NOTE — Patient Instructions (Addendum)
You were seen today by Lauraine Rinne, NP  for:   1. Interstitial lung disease due to connective tissue disease ([place])  Great job on your walk today  Remain physically active  Continue Plaquenil  Continue prednisone 5 mg daily  As discussed today we will hold off on starting antifibrotic medications.  If you change your mind you would like to discuss this more please let our office know.  Can consider referral back to pulmonary rehab whenever you would like.  2. Systemic sclerosis (HCC)  Continue Plaquenil  Continue prednisone 5 mg  3. Healthcare maintenance  Remain up-to-date with your COVID-19 vaccinations  Work on slowly increasing your overall physical activity goal should be 30 minutes of aerobic physical activity a day  Follow Up:    Return in about 4 months (around 04/11/2021), or if symptoms worsen or fail to improve, for Follow up with Dr. Purnell Shoemaker, ILD clinic - 31mn slot.   Notification of test results are managed in the following manner: If there are  any recommendations or changes to the  plan of care discussed in office today,  we will contact you and let you know what they are. If you do not hear from [facility] then your results are normal and you can view them through your  MyChart account , or a letter will be sent to you. Thank you again for trusting [facility] your care  - Thank you, [facility]    It is flu season:   >>> Best ways to protect herself from the flu: Receive the yearly flu vaccine, practice good hand hygiene washing with soap and also using hand sanitizer when available, eat a nutritious meals, get adequate rest, hydrate appropriately       Please contact the office if your symptoms worsen or you have concerns that you are not improving.   Thank you for choosing [facility] for your healthcare, and for allowing [facility] partner with you on your healthcare journey. I am thankful to be able to provide care to you today.   BWyn Quaker FNP-C

## 2020-12-16 NOTE — Progress Notes (Signed)
No change in ILD since last CT in 1 year ago  Plan  -r eturn to clinic in May Xxxxx  IMPRESSION: 1. No significant interval change in moderate pulmonary fibrosis with apical to basal gradient, featuring irregular peripheral interstitial opacity, traction bronchiectasis, bronchiolectasis, and probable honeycombing at the bilateral lung bases. Findings remain consistent with UIP pattern pulmonary fibrosis per consensus guidelines: Diagnosis of Idiopathic Pulmonary Fibrosis: An Official ATS/ERS/JRS/ALAT Clinical Practice Guideline. [place], Iss 5, [phone], Jul 31 2017. 2. Coronary artery disease. 3. Hepatic steatosis. Somewhat nodular contour of the liver, suggesting cirrhosis. Correlate with biochemical findings.  Aortic Atherosclerosis (ICD10-I70.0).   Electronically Signed   By: Dorna Bloom.D.

## 2020-12-17 ENCOUNTER — Ambulatory Visit (HOSPITAL_COMMUNITY): Payer: Medicare HMO

## 2020-12-17 ENCOUNTER — Encounter (HOSPITAL_COMMUNITY): Payer: Medicare HMO

## 2020-12-19 ENCOUNTER — Encounter (HOSPITAL_COMMUNITY): Payer: Medicare HMO

## 2020-12-24 ENCOUNTER — Encounter (HOSPITAL_COMMUNITY): Payer: Medicare HMO

## 2020-12-26 ENCOUNTER — Encounter (HOSPITAL_COMMUNITY): Payer: Medicare HMO

## 2020-12-31 ENCOUNTER — Encounter (HOSPITAL_COMMUNITY): Payer: Medicare HMO

## 2021-01-02 ENCOUNTER — Encounter (HOSPITAL_COMMUNITY): Payer: Medicare HMO

## 2021-01-02 DIAGNOSIS — M340 Progressive systemic sclerosis: Secondary | ICD-10-CM | POA: Diagnosis not present

## 2021-01-02 DIAGNOSIS — M5126 Other intervertebral disc displacement, lumbar region: Secondary | ICD-10-CM | POA: Diagnosis not present

## 2021-01-02 DIAGNOSIS — L03011 Cellulitis of right finger: Secondary | ICD-10-CM | POA: Diagnosis not present

## 2021-01-02 DIAGNOSIS — E669 Obesity, unspecified: Secondary | ICD-10-CM | POA: Diagnosis not present

## 2021-01-02 DIAGNOSIS — M150 Primary generalized (osteo)arthritis: Secondary | ICD-10-CM | POA: Diagnosis not present

## 2021-01-02 DIAGNOSIS — Z6834 Body mass index (BMI) 34.0-34.9, adult: Secondary | ICD-10-CM | POA: Diagnosis not present

## 2021-01-02 DIAGNOSIS — I7300 Raynaud's syndrome without gangrene: Secondary | ICD-10-CM | POA: Diagnosis not present

## 2021-01-02 DIAGNOSIS — J849 Interstitial pulmonary disease, unspecified: Secondary | ICD-10-CM | POA: Diagnosis not present

## 2021-01-02 DIAGNOSIS — M0579 Rheumatoid arthritis with rheumatoid factor of multiple sites without organ or systems involvement: Secondary | ICD-10-CM | POA: Diagnosis not present

## 2021-01-07 ENCOUNTER — Encounter (HOSPITAL_COMMUNITY): Payer: Medicare HMO

## 2021-01-09 ENCOUNTER — Encounter (HOSPITAL_COMMUNITY): Payer: Medicare HMO

## 2021-01-09 DIAGNOSIS — A09 Infectious gastroenteritis and colitis, unspecified: Secondary | ICD-10-CM | POA: Diagnosis not present

## 2021-01-14 ENCOUNTER — Encounter (HOSPITAL_COMMUNITY): Payer: Medicare HMO

## 2021-01-16 ENCOUNTER — Encounter (HOSPITAL_COMMUNITY): Payer: Medicare HMO

## 2021-01-21 ENCOUNTER — Encounter (HOSPITAL_COMMUNITY): Payer: Medicare HMO

## 2021-01-22 ENCOUNTER — Other Ambulatory Visit: Payer: Self-pay | Admitting: Gastroenterology

## 2021-01-22 DIAGNOSIS — R1084 Generalized abdominal pain: Secondary | ICD-10-CM

## 2021-01-22 DIAGNOSIS — J449 Chronic obstructive pulmonary disease, unspecified: Secondary | ICD-10-CM | POA: Diagnosis not present

## 2021-01-23 ENCOUNTER — Encounter (HOSPITAL_COMMUNITY): Payer: Medicare HMO

## 2021-01-24 NOTE — Progress Notes (Signed)
HPI: FU diastolic CHF; also with history of HTN, CREST/scleroderma and ILD. PFTs 4/15 with FEV1 1.54 and DLCO 68% predicted. LHC (03/15/14): RA 7, RV 43/7/9, PA 43/18 (mean 31), PCWP 25, CO 6.9, CI 3.45, no CAD. Placed on lasix. ABIs May 2016 normal. Most recent echocardiogram January 2021 showed normal LV function, moderate left ventricular hypertrophy, grade 1 diastolic dysfunction, mild left atrial enlargement.   Chest CT January 2022 showed pulmonary fibrosis, bronchiectasis consistent with UIP.  There was note of coronary artery disease and hepatic steatosis suggestive of cirrhosis.  Since last seen,  has dyspnea on exertion.  No orthopnea, PND or pedal edema.  No exertional chest pain.  Over the past several months she has had spells when she is working in her kitchen when she feels "hot", nauseated with mild chest tightness and dyspnea followed by dizziness.  She sits down and her symptoms resolved after 5 minutes.  No associated palpitations.  She does not have the symptoms during her walks.  Current Outpatient Medications  Medication Sig Dispense Refill  . acetaminophen (TYLENOL) 325 MG tablet Take 2 tablets (650 mg total) by mouth every 6 (six) hours as needed for mild pain (or Fever >/= 101). 40 tablet 0  . albuterol (VENTOLIN HFA) 108 (90 Base) MCG/ACT inhaler Inhale 2 puffs into the lungs every 6 (six) hours as needed for wheezing. 18 g 3  . amLODipine (NORVASC) 2.5 MG tablet TAKE 1 TABLET EVERY DAY 90 tablet 2  . aspirin 81 MG chewable tablet Chew 81 mg by mouth daily. Start on 10/26/16 after completion of Xarelto    . fexofenadine-pseudoephedrine (ALLEGRA-D) 60-120 MG 12 hr tablet Take 1 tablet by mouth daily.    . furosemide (LASIX) 40 MG tablet Take 2 tablets (80 mg total) by mouth daily. 180 tablet 3  . hydroxychloroquine (PLAQUENIL) 200 MG tablet Take 400 mg by mouth daily.    . pantoprazole (PROTONIX) 40 MG tablet Take 40 mg by mouth 2 (two) times daily.    . predniSONE  (DELTASONE) 5 MG tablet Take 5 mg by mouth daily with breakfast.    . triamcinolone (NASACORT) 55 MCG/ACT AERO nasal inhaler Place 2 sprays into the nose daily.    Marland Kitchen zolpidem (AMBIEN) 10 MG tablet Take 5-10 mg by mouth at bedtime as needed for sleep.      No current facility-administered medications for this visit.     Past Medical History:  Diagnosis Date  . Diastolic congestive heart failure ([place])    02/05/15  states no fluid in extremities, breathing same as has been  . GERD (gastroesophageal reflux disease)   . History of blood transfusion   . Hx of cholecystectomy   . Interstitial lung disease ([place])   . Osteoarthritis   . Pulmonary fibrosis ([place])   . Raynaud disease   . Rheumatoid arthritis(714.0)   . Scleroderma ([place])   . Seasonal allergies   . Shortness of breath dyspnea    states normal for her  . Systemic sclerosis [facility])     Past Surgical History:  Procedure Laterality Date  . ABDOMINAL HYSTERECTOMY    . BLADDER SUSPENSION    . CARPAL TUNNEL RELEASE    . LAPAROSCOPIC CHOLECYSTECTOMY    . LEFT HEART CATHETERIZATION WITH CORONARY/GRAFT ANGIOGRAM  03/15/2014   Procedure: LEFT HEART CATHETERIZATION WITH Beatrix Fetters;  Surgeon: Burnell Blanks, MD;  Location: [facility] CATH LAB;  Service: Cardiovascular;;  . TONSILLECTOMY    . TOTAL KNEE ARTHROPLASTY  Left 02/11/2015   Procedure: LEFT TOTAL KNEE ARTHROPLASTY, RIGHT KNEE CORTISONE INJECTION;  Surgeon: Gaynelle Arabian, MD;  Location: [facility] ORS;  Service: Orthopedics;  Laterality: Left;  . TOTAL KNEE ARTHROPLASTY Right 10/05/2016   Procedure: RIGHT TOTAL KNEE ARTHROPLASTY;  Surgeon: Gaynelle Arabian, MD;  Location: [facility] ORS;  Service: Orthopedics;  Laterality: Right;  Marland Kitchen VESICOVAGINAL FISTULA CLOSURE W/ TAH  1987    Social History   Socioeconomic History  . Marital status: Married    Spouse name: Not on file  . Number of children: 3  . Years of education: Not on file  . Highest education level: Some college, no degree   Occupational History  . Occupation: Optician, dispensing: [employer]  Tobacco Use  . Smoking status: Former Smoker    Packs/day: 1.00    Years: 20.00    Pack years: 20.00    Types: Cigarettes    Quit date: 11/30/1988    Years since quitting: 32.2  . Smokeless tobacco: Never Used  Substance and Sexual Activity  . Alcohol use: No  . Drug use: No  . Sexual activity: Not on file  Other Topics Concern  . Not on file  Social History Narrative  . Not on file   Social Determinants of Health   Financial Resource Strain: Not on file  Food Insecurity: Not on file  Transportation Needs: Not on file  Physical Activity: Not on file  Stress: Not on file  Social Connections: Not on file  Intimate Partner Violence: Not on file    Family History  Problem Relation Age of Onset  . Stroke Mother   . Heart disease Mother        CABG at age 17  . Heart attack Father        Died of MI at age 14  . Emphysema Father   . Dementia Brother   . Osteoarthritis Other   . Celiac disease Other     ROS: no fevers or chills, productive cough, hemoptysis, dysphasia, odynophagia, melena, hematochezia, dysuria, hematuria, rash, seizure activity, orthopnea, PND, pedal edema, claudication. Remaining systems are negative.  Physical Exam: Well-developed well-nourished in no acute distress.  Skin is warm and dry.  HEENT is normal.  Neck is supple.  Chest is clear to auscultation with normal expansion.  Cardiovascular exam is regular rate and rhythm.  Abdominal exam nontender or distended. No masses palpated. Extremities show no edema. neuro grossly intact  ECG-normal sinus rhythm with first-degree AV block, no ST changes.  Personally reviewed  A/P  1 chronic diastolic congestive heart failure-she appears to be euvolemic on examination today.  Continue Lasix at present dose.  Continue fluid restriction and low-sodium diet.   2 hypertension-blood pressure controlled.  Continue  present medications and follow.  3 history of palpitations-she is not having significant symptoms at present.  Can consider monitoring the future if needed.  4 coronary calcification-noted on previous CT scan.  Previous catheterization did not reveal obstructive coronary disease.  Add Crestor 40 mg daily.  Check lipids and liver in 12 weeks.  5 interstitial lung disease-per pulmonary.  6 dizziness/chest tightness-episodes as described in HPI.  She describes transient dizziness, chest tightness and dyspnea associated with nausea for 5 minutes.  These episodes not occur when she is walking.  Etiology unclear.  I will arrange a [place] nuclear study to screen for ischemia.  Kirk Ruths, MD

## 2021-01-28 ENCOUNTER — Encounter (HOSPITAL_COMMUNITY): Payer: Medicare HMO

## 2021-01-30 ENCOUNTER — Encounter (HOSPITAL_COMMUNITY): Payer: Medicare HMO

## 2021-02-03 ENCOUNTER — Encounter: Payer: Self-pay | Admitting: Cardiology

## 2021-02-03 ENCOUNTER — Ambulatory Visit: Payer: Medicare HMO | Admitting: Cardiology

## 2021-02-03 ENCOUNTER — Other Ambulatory Visit: Payer: Self-pay

## 2021-02-03 VITALS — BP 132/62 | HR 88 | Ht 63.0 in | Wt 193.0 lb

## 2021-02-03 DIAGNOSIS — I10 Essential (primary) hypertension: Secondary | ICD-10-CM | POA: Diagnosis not present

## 2021-02-03 DIAGNOSIS — I2510 Atherosclerotic heart disease of native coronary artery without angina pectoris: Secondary | ICD-10-CM

## 2021-02-03 DIAGNOSIS — I5032 Chronic diastolic (congestive) heart failure: Secondary | ICD-10-CM | POA: Diagnosis not present

## 2021-02-03 DIAGNOSIS — R072 Precordial pain: Secondary | ICD-10-CM | POA: Diagnosis not present

## 2021-02-03 MED ORDER — ROSUVASTATIN CALCIUM 40 MG PO TABS: 40.0000 mg | ORAL_TABLET | Freq: Every day | ORAL | 3 refills | Status: DC

## 2021-02-03 NOTE — Patient Instructions (Signed)
Medication Instructions:   START ROSUVASTATIN 40 MG ONCE DAILY  *If you need a refill on your cardiac medications before your next appointment, please call your pharmacy*   Lab Work:  Your physician recommends that you return for lab work in: 12 WEEKS=FASTING  If you have labs (blood work) drawn today and your tests are completely normal, you will receive your results only by: Marland Kitchen MyChart Message (if you have MyChart) OR . A paper copy in the mail If you have any lab test that is abnormal or we need to change your treatment, we will call you to review the results.   Testing/Procedures: Your physician has requested that you have a lexiscan myoview. For further information please visit [email or web address]. Please follow instruction sheet, as given.[place]     Follow-Up: At [facility], you and your health needs are our priority.  As part of our continuing mission to provide you with exceptional heart care, we have created designated Provider Care Teams.  These Care Teams include your primary Cardiologist (physician) and Advanced Practice Providers (APPs -  Physician Assistants and Nurse Practitioners) who all work together to provide you with the care you need, when you need it.  We recommend signing up for the patient portal called "MyChart".  Sign up information is provided on this After Visit Summary.  MyChart is used to connect with patients for Virtual Visits (Telemedicine).  Patients are able to view lab/test results, encounter notes, upcoming appointments, etc.  Non-urgent messages can be sent to your provider as well.   To learn more about what you can do with MyChart, go to [email or web address].    Your next appointment:   6 week(s)  The format for your next appointment:   In Person  Provider:   Kirk Ruths, MD

## 2021-02-04 ENCOUNTER — Encounter (HOSPITAL_COMMUNITY): Payer: Medicare HMO

## 2021-02-05 ENCOUNTER — Ambulatory Visit
Admission: RE | Admit: 2021-02-05 | Discharge: 2021-02-05 | Disposition: A | Discharge status: Home / Self Care | Payer: Medicare HMO | Source: Ambulatory Visit | Attending: Gastroenterology | Admitting: Gastroenterology

## 2021-02-05 ENCOUNTER — Telehealth (HOSPITAL_COMMUNITY): Payer: Self-pay | Admitting: *Deleted

## 2021-02-05 DIAGNOSIS — K760 Fatty (change of) liver, not elsewhere classified: Secondary | ICD-10-CM | POA: Diagnosis not present

## 2021-02-05 DIAGNOSIS — R1084 Generalized abdominal pain: Secondary | ICD-10-CM

## 2021-02-05 MED ORDER — IOPAMIDOL (ISOVUE-300) INJECTION 61%
100.0000 mL | Freq: Once | INTRAVENOUS | Status: AC | PRN
  Administered 2021-02-05: 100 mL via INTRAVENOUS

## 2021-02-05 NOTE — Telephone Encounter (Signed)
Patients husband per DPR was given detailed instructions per Myocardial Perfusion Study Information Sheet for the test on 02/12/21 at 1030. Patient notified to arrive 15 minutes early and that it is imperative to arrive on time for appointment to keep from having the test rescheduled.  If you need to cancel or reschedule your appointment, please call the office within 24 hours of your appointment. . Patient verbalized understanding.[place]acher, Ranae Palms

## 2021-02-06 ENCOUNTER — Encounter (HOSPITAL_COMMUNITY): Payer: Medicare HMO

## 2021-02-07 DIAGNOSIS — I2510 Atherosclerotic heart disease of native coronary artery without angina pectoris: Secondary | ICD-10-CM | POA: Diagnosis not present

## 2021-02-08 LAB — LIPID PANEL
Chol/HDL Ratio: 2.1 ratio (ref 0.0–4.4)
Cholesterol, Total: 129 mg/dL (ref 100–199)
HDL: 61 mg/dL (ref >39)
LDL Chol Calc (NIH): 50 mg/dL (ref 0–99)
Triglycerides: 99 mg/dL (ref 0–149)
VLDL Cholesterol Cal: 18 mg/dL (ref 5–40)

## 2021-02-08 LAB — HEPATIC FUNCTION PANEL
ALT: 25 IU/L (ref 0–32)
AST: 32 IU/L (ref 0–40)
Albumin: 4.1 g/dL (ref 3.7–4.7)
Alkaline Phosphatase: 95 IU/L (ref 44–121)
Bilirubin Total: 0.7 mg/dL (ref 0.0–1.2)
Bilirubin, Direct: 0.28 mg/dL (ref 0.00–0.40)
Total Protein: 6.3 g/dL (ref 6.0–8.5)

## 2021-02-11 ENCOUNTER — Encounter (HOSPITAL_COMMUNITY): Payer: Medicare HMO

## 2021-02-12 ENCOUNTER — Encounter: Payer: Self-pay | Admitting: *Deleted

## 2021-02-12 ENCOUNTER — Other Ambulatory Visit: Payer: Self-pay

## 2021-02-12 ENCOUNTER — Ambulatory Visit (HOSPITAL_COMMUNITY): Payer: Medicare HMO | Attending: Cardiology

## 2021-02-12 DIAGNOSIS — R072 Precordial pain: Secondary | ICD-10-CM | POA: Insufficient documentation

## 2021-02-12 LAB — MYOCARDIAL PERFUSION IMAGING
LV dias vol: 57 mL (ref 46–106)
LV sys vol: 10 mL
Peak HR: 92 {beats}/min
Rest HR: 74 {beats}/min
SDS: 0
SRS: 0
SSS: 0
TID: 1

## 2021-02-12 MED ORDER — TECHNETIUM TC 99M TETROFOSMIN IV KIT
10.5000 | PACK | Freq: Once | INTRAVENOUS | Status: AC | PRN
  Administered 2021-02-12: 10.5 via INTRAVENOUS
  Filled 2021-02-12: qty 11

## 2021-02-12 MED ORDER — TECHNETIUM TC 99M TETROFOSMIN IV KIT
30.9000 | PACK | Freq: Once | INTRAVENOUS | Status: AC | PRN
  Administered 2021-02-12: 30.9 via INTRAVENOUS
  Filled 2021-02-12: qty 31

## 2021-02-12 MED ORDER — REGADENOSON 0.4 MG/5ML IV SOLN
0.4000 mg | Freq: Once | INTRAVENOUS | Status: AC
  Administered 2021-02-12: 0.4 mg via INTRAVENOUS

## 2021-02-12 NOTE — Telephone Encounter (Signed)
  This encounter was created in error - please disregard.

## 2021-02-13 ENCOUNTER — Encounter (HOSPITAL_COMMUNITY): Payer: Medicare HMO

## 2021-02-18 ENCOUNTER — Encounter (HOSPITAL_COMMUNITY): Payer: Medicare HMO

## 2021-02-20 ENCOUNTER — Encounter (HOSPITAL_COMMUNITY): Payer: Medicare HMO

## 2021-02-25 ENCOUNTER — Encounter (HOSPITAL_COMMUNITY): Payer: Medicare HMO

## 2021-02-27 ENCOUNTER — Encounter (HOSPITAL_COMMUNITY): Payer: Medicare HMO

## 2021-03-04 ENCOUNTER — Encounter (HOSPITAL_COMMUNITY): Payer: Medicare HMO

## 2021-03-06 ENCOUNTER — Encounter (HOSPITAL_COMMUNITY): Payer: Medicare HMO

## 2021-03-07 DIAGNOSIS — L02429 Furuncle of limb, unspecified: Secondary | ICD-10-CM | POA: Diagnosis not present

## 2021-03-11 ENCOUNTER — Encounter (HOSPITAL_COMMUNITY): Payer: Medicare HMO

## 2021-03-12 NOTE — Progress Notes (Deleted)
HPI: FU diastolic CHF; also with history of HTN, CREST/scleroderma and ILD. PFTs 4/15 with FEV1 1.54 and DLCO 68% predicted. LHC (03/15/14): RA 7, RV 43/7/9, PA 43/18 (mean 31), PCWP 25, CO 6.9, CI 3.45, no CAD. Placed on lasix. ABIs May 2016 normal.Most recent echocardiogram January 2021 showed normal LV function, moderate left ventricular hypertrophy, grade 1 diastolic dysfunction, mild left atrial enlargement.  Chest CT January 2022 showed pulmonary fibrosis, bronchiectasis consistent with UIP.  There was note of coronary artery disease and hepatic steatosis suggestive of cirrhosis. Nuclear study March 2022 showed ejection fraction 82% and normal perfusion.  Since last seen,   Current Outpatient Medications  Medication Sig Dispense Refill  . acetaminophen (TYLENOL) 325 MG tablet Take 2 tablets (650 mg total) by mouth every 6 (six) hours as needed for mild pain (or Fever >/= 101). 40 tablet 0  . albuterol (VENTOLIN HFA) 108 (90 Base) MCG/ACT inhaler Inhale 2 puffs into the lungs every 6 (six) hours as needed for wheezing. 18 g 3  . amLODipine (NORVASC) 2.5 MG tablet TAKE 1 TABLET EVERY DAY 90 tablet 2  . aspirin 81 MG chewable tablet Chew 81 mg by mouth daily. Start on 10/26/16 after completion of Xarelto    . fexofenadine-pseudoephedrine (ALLEGRA-D) 60-120 MG 12 hr tablet Take 1 tablet by mouth daily.    . furosemide (LASIX) 40 MG tablet Take 2 tablets (80 mg total) by mouth daily. 180 tablet 3  . hydroxychloroquine (PLAQUENIL) 200 MG tablet Take 400 mg by mouth daily.    . pantoprazole (PROTONIX) 40 MG tablet Take 40 mg by mouth 2 (two) times daily.    . predniSONE (DELTASONE) 5 MG tablet Take 5 mg by mouth daily with breakfast.    . rosuvastatin (CRESTOR) 40 MG tablet Take 1 tablet (40 mg total) by mouth daily. 90 tablet 3  . triamcinolone (NASACORT) 55 MCG/ACT AERO nasal inhaler Place 2 sprays into the nose daily.    Marland Kitchen zolpidem (AMBIEN) 10 MG tablet Take 5-10 mg by mouth at bedtime  as needed for sleep.      No current facility-administered medications for this visit.     Past Medical History:  Diagnosis Date  . Diastolic congestive heart failure ([place])    02/05/15  states no fluid in extremities, breathing same as has been  . GERD (gastroesophageal reflux disease)   . History of blood transfusion   . Hx of cholecystectomy   . Interstitial lung disease ([place])   . Osteoarthritis   . Pulmonary fibrosis ([place])   . Raynaud disease   . Rheumatoid arthritis(714.0)   . Scleroderma ([place])   . Seasonal allergies   . Shortness of breath dyspnea    states normal for her  . Systemic sclerosis [facility])     Past Surgical History:  Procedure Laterality Date  . ABDOMINAL HYSTERECTOMY    . BLADDER SUSPENSION    . CARPAL TUNNEL RELEASE    . LAPAROSCOPIC CHOLECYSTECTOMY    . LEFT HEART CATHETERIZATION WITH CORONARY/GRAFT ANGIOGRAM  03/15/2014   Procedure: LEFT HEART CATHETERIZATION WITH Beatrix Fetters;  Surgeon: Burnell Blanks, MD;  Location: [facility] CATH LAB;  Service: Cardiovascular;;  . TONSILLECTOMY    . TOTAL KNEE ARTHROPLASTY Left 02/11/2015   Procedure: LEFT TOTAL KNEE ARTHROPLASTY, RIGHT KNEE CORTISONE INJECTION;  Surgeon: Gaynelle Arabian, MD;  Location: [facility]S;  Service: Orthopedics;  Laterality: Left;  . TOTAL KNEE ARTHROPLASTY Right 10/05/2016   Procedure: RIGHT TOTAL KNEE ARTHROPLASTY;  Surgeon:  Gaynelle Arabian, MD;  Location: [facility]S;  Service: Orthopedics;  Laterality: Right;  Marland Kitchen VESICOVAGINAL FISTULA CLOSURE W/ TAH  1987    Social History   Socioeconomic History  . Marital status: Married    Spouse name: Not on file  . Number of children: 3  . Years of education: Not on file  . Highest education level: Some college, no degree  Occupational History  . Occupation: Optician, dispensing: [employer]  Tobacco Use  . Smoking status: Former Smoker    Packs/day: 1.00    Years: 20.00    Pack years: 20.00    Types: Cigarettes    Quit  date: 11/30/1988    Years since quitting: 32.3  . Smokeless tobacco: Never Used  Substance and Sexual Activity  . Alcohol use: No  . Drug use: No  . Sexual activity: Not on file  Other Topics Concern  . Not on file  Social History Narrative  . Not on file   Social Determinants of Health   Financial Resource Strain: Not on file  Food Insecurity: Not on file  Transportation Needs: Not on file  Physical Activity: Not on file  Stress: Not on file  Social Connections: Not on file  Intimate Partner Violence: Not on file    Family History  Problem Relation Age of Onset  . Stroke Mother   . Heart disease Mother        CABG at age 71  . Heart attack Father        Died of MI at age 63  . Emphysema Father   . Dementia Brother   . Osteoarthritis Other   . Celiac disease Other     ROS: no fevers or chills, productive cough, hemoptysis, dysphasia, odynophagia, melena, hematochezia, dysuria, hematuria, rash, seizure activity, orthopnea, PND, pedal edema, claudication. Remaining systems are negative.  Physical Exam: Well-developed well-nourished in no acute distress.  Skin is warm and dry.  HEENT is normal.  Neck is supple.  Chest is clear to auscultation with normal expansion.  Cardiovascular exam is regular rate and rhythm.  Abdominal exam nontender or distended. No masses palpated. Extremities show no edema. neuro grossly intact  ECG- personally reviewed  A/P  1 chronic diastolic congestive heart failure-she is euvolemic on examination.  Continue diuretic at present dose.  2 recent chest tightness-nuclear study shows no ischemia.  We will continue observation at this point.  3 hypertension-blood pressure is controlled.  Continue present medical regimen.  4 coronary calcification-noted on previous CTA.  Previous catheterization revealed no obstructive coronary disease and nuclear study shows no ischemia.  Continue statin.  5 hyperlipidemia-continue statin.  She will need  lipids and liver in approximately 8 weeks.  6 history of interstitial lung disease-managed by pulmonary.  7 history of palpitations-no recent symptoms.  Kirk Ruths, MD

## 2021-03-16 ENCOUNTER — Emergency Department (HOSPITAL_COMMUNITY): Payer: Medicare HMO

## 2021-03-16 ENCOUNTER — Encounter (HOSPITAL_COMMUNITY): Payer: Self-pay | Admitting: *Deleted

## 2021-03-16 ENCOUNTER — Emergency Department (HOSPITAL_COMMUNITY)
Admission: EM | Admit: 2021-03-16 | Discharge: 2021-03-16 | Disposition: A | Discharge status: Home / Self Care | Payer: Medicare HMO | Attending: Emergency Medicine | Admitting: Emergency Medicine

## 2021-03-16 ENCOUNTER — Other Ambulatory Visit: Payer: Self-pay

## 2021-03-16 DIAGNOSIS — Z7982 Long term (current) use of aspirin: Secondary | ICD-10-CM | POA: Insufficient documentation

## 2021-03-16 DIAGNOSIS — J40 Bronchitis, not specified as acute or chronic: Secondary | ICD-10-CM | POA: Diagnosis not present

## 2021-03-16 DIAGNOSIS — U071 COVID-19: Secondary | ICD-10-CM | POA: Diagnosis not present

## 2021-03-16 DIAGNOSIS — Z87891 Personal history of nicotine dependence: Secondary | ICD-10-CM | POA: Insufficient documentation

## 2021-03-16 DIAGNOSIS — R0602 Shortness of breath: Secondary | ICD-10-CM | POA: Diagnosis not present

## 2021-03-16 DIAGNOSIS — Z79899 Other long term (current) drug therapy: Secondary | ICD-10-CM | POA: Diagnosis not present

## 2021-03-16 DIAGNOSIS — Z96653 Presence of artificial knee joint, bilateral: Secondary | ICD-10-CM | POA: Insufficient documentation

## 2021-03-16 DIAGNOSIS — I5032 Chronic diastolic (congestive) heart failure: Secondary | ICD-10-CM | POA: Insufficient documentation

## 2021-03-16 DIAGNOSIS — Z20822 Contact with and (suspected) exposure to covid-19: Secondary | ICD-10-CM | POA: Diagnosis not present

## 2021-03-16 DIAGNOSIS — R059 Cough, unspecified: Secondary | ICD-10-CM | POA: Diagnosis not present

## 2021-03-16 DIAGNOSIS — I110 Hypertensive heart disease with heart failure: Secondary | ICD-10-CM | POA: Diagnosis not present

## 2021-03-16 LAB — COMPREHENSIVE METABOLIC PANEL
ALT: 33 U/L (ref 0–44)
AST: 40 U/L (ref 15–41)
Albumin: 4.1 g/dL (ref 3.5–5.0)
Alkaline Phosphatase: 87 U/L (ref 38–126)
Anion gap: 11 (ref 5–15)
BUN: 11 mg/dL (ref 8–23)
CO2: 28 mmol/L (ref 22–32)
Calcium: 8.8 mg/dL — ABNORMAL LOW (ref 8.9–10.3)
Chloride: 100 mmol/L (ref 98–111)
Creatinine, Ser: 0.78 mg/dL (ref 0.44–1.00)
GFR, Estimated: >60 mL/min (ref >60)
Glucose, Bld: 134 mg/dL — ABNORMAL HIGH (ref 70–99)
Potassium: 3 mmol/L — ABNORMAL LOW (ref 3.5–5.1)
Sodium: 139 mmol/L (ref 135–145)
Total Bilirubin: 0.6 mg/dL (ref 0.3–1.2)
Total Protein: 7.5 g/dL (ref 6.5–8.1)

## 2021-03-16 LAB — CBC WITH DIFFERENTIAL/PLATELET
Abs Immature Granulocytes: 0.04 10*3/uL (ref 0.00–0.07)
Basophils Absolute: 0.1 10*3/uL (ref 0.0–0.1)
Basophils Relative: 1 %
Eosinophils Absolute: 0 10*3/uL (ref 0.0–0.5)
Eosinophils Relative: 0 %
HCT: 43.5 % (ref 36.0–46.0)
Hemoglobin: 14.8 g/dL (ref 12.0–15.0)
Immature Granulocytes: 0 %
Lymphocytes Relative: 19 %
Lymphs Abs: 1.9 10*3/uL (ref 0.7–4.0)
MCH: 33.3 pg (ref 26.0–34.0)
MCHC: 34 g/dL (ref 30.0–36.0)
MCV: 97.8 fL (ref 80.0–100.0)
Monocytes Absolute: 1 10*3/uL (ref 0.1–1.0)
Monocytes Relative: 10 %
Neutro Abs: 6.9 10*3/uL (ref 1.7–7.7)
Neutrophils Relative %: 70 %
Platelets: 277 10*3/uL (ref 150–400)
RBC: 4.45 MIL/uL (ref 3.87–5.11)
RDW: 12.4 % (ref 11.5–15.5)
WBC: 10 10*3/uL (ref 4.0–10.5)
nRBC: 0 % (ref 0.0–0.2)

## 2021-03-16 LAB — RESP PANEL BY RT-PCR (FLU A&B, COVID) ARPGX2
Influenza A by PCR: NEGATIVE
Influenza B by PCR: NEGATIVE
SARS Coronavirus 2 by RT PCR: NEGATIVE

## 2021-03-16 MED ORDER — ALBUTEROL SULFATE HFA 108 (90 BASE) MCG/ACT IN AERS
2.0000 | INHALATION_SPRAY | Freq: Once | RESPIRATORY_TRACT | Status: AC
  Administered 2021-03-16: 2 via RESPIRATORY_TRACT
  Filled 2021-03-16: qty 6.7

## 2021-03-16 MED ORDER — MAGNESIUM SULFATE 2 GM/50ML IV SOLN
2.0000 g | Freq: Once | INTRAVENOUS | Status: AC
  Administered 2021-03-16: 2 g via INTRAVENOUS
  Filled 2021-03-16: qty 50

## 2021-03-16 MED ORDER — METHYLPREDNISOLONE SODIUM SUCC 125 MG IJ SOLR
125.0000 mg | Freq: Once | INTRAMUSCULAR | Status: AC
  Administered 2021-03-16: 125 mg via INTRAVENOUS
  Filled 2021-03-16: qty 2

## 2021-03-16 MED ORDER — DOXYCYCLINE HYCLATE 100 MG PO CAPS: 100.0000 mg | ORAL_CAPSULE | Freq: Two times a day (BID) | ORAL | 0 refills | Status: DC

## 2021-03-16 MED ORDER — PREDNISONE 10 MG PO TABS: 20.0000 mg | ORAL_TABLET | Freq: Every day | ORAL | 0 refills | Status: DC

## 2021-03-16 NOTE — Discharge Instructions (Addendum)
Follow-up with your family doctor later this week for recheck return if any problem

## 2021-03-16 NOTE — ED Provider Notes (Signed)
[facility] EMERGENCY DEPARTMENT Provider Note   CSN: [id number] Arrival date & time: 03/16/21  1936     History Chief Complaint  Patient presents with  . Covid    [name] is a 75 y.o. female.  Patient with cough and shortness of breath.  No fever no chills no vomiting  The history is provided by the patient. No language interpreter was used.  Cough Cough characteristics:  Non-productive Sputum characteristics:  Nondescript Severity:  Moderate Onset quality:  Sudden Timing:  Constant Progression:  Waxing and waning Chronicity:  New Smoker: no   Context: not animal exposure   Associated symptoms: no chest pain, no eye discharge, no headaches and no rash        Past Medical History:  Diagnosis Date  . Diastolic congestive heart failure ([place])    02/05/15  states no fluid in extremities, breathing same as has been  . GERD (gastroesophageal reflux disease)   . History of blood transfusion   . Hx of cholecystectomy   . Interstitial lung disease ([place])   . Osteoarthritis   . Pulmonary fibrosis ([place])   . Raynaud disease   . Rheumatoid arthritis(714.0)   . Scleroderma ([place])   . Seasonal allergies   . Shortness of breath dyspnea    states normal for her  . Systemic sclerosis [facility])     Patient Active Problem List   Diagnosis Date Noted  . Healthcare maintenance 12/12/2020  . Interstitial lung disease due to connective tissue disease ([place]) 12/12/2020  . Fracture of metacarpal bone 07/07/2019  . DDD (degenerative disc disease), cervical 11/29/2018  . Pain of left hip joint 05/06/2018  . Ischemia of finger 03/01/2018  . Palpitations 02/11/2016  . Foot infection 04/30/2015  . OA (osteoarthritis) of knee 02/11/2015  . Preop cardiovascular exam 01/25/2015  . Osteopenia 09/03/2014  . Dyspnea 08/14/2014  . Chronic diastolic congestive heart failure ([place]) 08/14/2014  . Pulmonary hypertension ([place]) 04/25/2014  . Chest pain 03/13/2014  . Systemic sclerosis ([place])    . Osteoarthritis   . Seasonal allergies   . Chronic rheumatic arthritis ([place])   . Raynaud disease   . Hypertension   . Pulmonary fibrosis ([place])   . Arthritis of knee, degenerative 09/30/2011  . Lung disease with systemic sclerosis ([place]) 09/30/2011  . Osteoarthritis of both knees 09/30/2011    Past Surgical History:  Procedure Laterality Date  . ABDOMINAL HYSTERECTOMY    . BLADDER SUSPENSION    . CARPAL TUNNEL RELEASE    . LAPAROSCOPIC CHOLECYSTECTOMY    . LEFT HEART CATHETERIZATION WITH CORONARY/GRAFT ANGIOGRAM  03/15/2014   Procedure: LEFT HEART CATHETERIZATION WITH Beatrix Fetters;  Surgeon: Burnell Blanks, MD;  Location: [facility] CATH LAB;  Service: Cardiovascular;;  . TONSILLECTOMY    . TOTAL KNEE ARTHROPLASTY Left 02/11/2015   Procedure: LEFT TOTAL KNEE ARTHROPLASTY, RIGHT KNEE CORTISONE INJECTION;  Surgeon: Gaynelle Arabian, MD;  Location: [facility] ORS;  Service: Orthopedics;  Laterality: Left;  . TOTAL KNEE ARTHROPLASTY Right 10/05/2016   Procedure: RIGHT TOTAL KNEE ARTHROPLASTY;  Surgeon: Gaynelle Arabian, MD;  Location: [facility] ORS;  Service: Orthopedics;  Laterality: Right;  Marland Kitchen VESICOVAGINAL FISTULA CLOSURE W/ [facility]  1987     OB History   No obstetric history on file.     Family History  Problem Relation Age of Onset  . Stroke Mother   . Heart disease Mother        CABG at age 62  . Heart attack Father  Died of MI at age 25  . Emphysema Father   . Dementia Brother   . Osteoarthritis Other   . Celiac disease Other     Social History   Tobacco Use  . Smoking status: Former Smoker    Packs/day: 1.00    Years: 20.00    Pack years: 20.00    Types: Cigarettes    Quit date: 11/30/1988    Years since quitting: 32.3  . Smokeless tobacco: Never Used  Substance Use Topics  . Alcohol use: No  . Drug use: No    Home Medications Prior to Admission medications   Medication Sig Start Date End Date Taking? Authorizing Provider  doxycycline (VIBRAMYCIN) 100 MG  capsule Take 1 capsule (100 mg total) by mouth 2 (two) times daily. One po bid x 7 days 03/16/21  Yes Milton Ferguson, MD  predniSONE (DELTASONE) 10 MG tablet Take 2 tablets (20 mg total) by mouth daily. 03/16/21  Yes Milton Ferguson, MD  acetaminophen (TYLENOL) 325 MG tablet Take 2 tablets (650 mg total) by mouth every 6 (six) hours as needed for mild pain (or Fever >/= 101). 02/13/15   Perkins, Alexzandrew L, PA-C  albuterol (VENTOLIN HFA) 108 (90 Base) MCG/ACT inhaler Inhale 2 puffs into the lungs every 6 (six) hours as needed for wheezing. 12/26/19   Brand Males, MD  amLODipine (NORVASC) 2.5 MG tablet TAKE 1 TABLET EVERY DAY 05/13/20   Erlene Quan, PA-C  aspirin 81 MG chewable tablet Chew 81 mg by mouth daily. Start on 10/26/16 after completion of Xarelto    [provider]  fexofenadine-pseudoephedrine (ALLEGRA-D) 60-120 MG 12 hr tablet Take 1 tablet by mouth daily.    [provider]  furosemide (LASIX) 40 MG tablet Take 2 tablets (80 mg total) by mouth daily. 06/07/20   Darreld Mclean, PA-C  hydroxychloroquine (PLAQUENIL) 200 MG tablet Take 400 mg by mouth daily.    [provider]  pantoprazole (PROTONIX) 40 MG tablet Take 40 mg by mouth 2 (two) times daily.    [provider]  rosuvastatin (CRESTOR) 40 MG tablet Take 1 tablet (40 mg total) by mouth daily. 02/03/21 05/04/21  Lelon Perla, MD  triamcinolone (NASACORT) 55 MCG/ACT AERO nasal inhaler Place 2 sprays into the nose daily.    [provider]  zolpidem (AMBIEN) 10 MG tablet Take 5-10 mg by mouth at bedtime as needed for sleep.     [provider]    Allergies    Codeine  Review of Systems   Review of Systems  Constitutional: Negative for appetite change and fatigue.  HENT: Negative for congestion, ear discharge and sinus pressure.   Eyes: Negative for discharge.  Respiratory: Positive for cough.   Cardiovascular: Negative for chest pain.  Gastrointestinal: Negative  for abdominal pain and diarrhea.  Genitourinary: Negative for frequency and hematuria.  Musculoskeletal: Negative for back pain.  Skin: Negative for rash.  Neurological: Negative for seizures and headaches.  Psychiatric/Behavioral: Negative for hallucinations.    Physical Exam Updated Vital Signs BP 117/87   Pulse 89   Temp 98.5 F (36.9 C) (Oral)   Resp 20   Ht _0  (1.6 m)   Wt 86.6 kg   SpO2 94%   BMI 33.83 kg/m   Physical Exam Vitals and nursing note reviewed.  Constitutional:      Appearance: She is well-developed.  HENT:     Head: Normocephalic.     Nose: Nose normal.  Eyes:  General: No scleral icterus.    Conjunctiva/sclera: Conjunctivae normal.  Neck:     Thyroid: No thyromegaly.  Cardiovascular:     Rate and Rhythm: Normal rate and regular rhythm.     Heart sounds: No murmur heard. No friction rub. No gallop.   Pulmonary:     Breath sounds: No stridor. No wheezing or rales.  Chest:     Chest wall: No tenderness.  Abdominal:     General: There is no distension.     Tenderness: There is no abdominal tenderness. There is no rebound.  Musculoskeletal:        General: Normal range of motion.     Cervical back: Neck supple.  Lymphadenopathy:     Cervical: No cervical adenopathy.  Skin:    Findings: No erythema or rash.  Neurological:     Mental Status: She is alert and oriented to person, place, and time.     Motor: No abnormal muscle tone.     Coordination: Coordination normal.  Psychiatric:        Behavior: Behavior normal.     ED Results / Procedures / Treatments   Labs (all labs ordered are listed, but only abnormal results are displayed) Labs Reviewed  COMPREHENSIVE METABOLIC PANEL - Abnormal; Notable for the following components:      Result Value   Potassium 3.0 (*)    Glucose, Bld 134 (*)    Calcium 8.8 (*)    All other components within normal limits  RESP PANEL BY RT-PCR (FLU A&B, COVID) ARPGX2  CBC WITH DIFFERENTIAL/PLATELET     EKG None  Radiology DG Chest Port 1 View  Result Date: 03/16/2021 CLINICAL DATA:  Shortness of breath and cough for 2 days with COVID-19 positivity EXAM: PORTABLE CHEST 1 VIEW COMPARISON:  12/10/2020 CT FINDINGS: Cardiac shadow is mildly enlarged but stable. Chronic scarring is noted in the lungs bilaterally similar to that seen on prior CT examination. No focal infiltrate or sizable effusion is noted. No acute bony abnormality is seen. IMPRESSION: Chronic changes without acute abnormality. Electronically Signed   By: Inez Catalina M.D.   On: 03/16/2021 20:55    Procedures Procedures   Medications Ordered in ED Medications  magnesium sulfate IVPB 2 g 50 mL (2 g Intravenous New Bag/Given 03/16/21 2112)  methylPREDNISolone sodium succinate (SOLU-MEDROL) 125 mg/2 mL injection 125 mg (125 mg Intravenous Given 03/16/21 2107)  albuterol (VENTOLIN HFA) 108 (90 Base) MCG/ACT inhaler 2 puff (2 puffs Inhalation Given 03/16/21 2046)    ED Course  I have reviewed the triage vital signs and the nursing notes.  Pertinent labs & imaging results that were available during my care of the patient were reviewed by me and considered in my medical decision making (see chart for details).    MDM Rules/Calculators/A&P                         Patient with bronchitis and COPD exacerbation she is started on doxycycline and prednisone and follow-up with PCP.  Patient improved with treatment in the emergency department Final Clinical Impression(s) / ED Diagnoses Final diagnoses:  Bronchitis    Rx / DC Orders ED Discharge Orders         Ordered    doxycycline (VIBRAMYCIN) 100 MG capsule  2 times daily        03/16/21 2208    predniSONE (DELTASONE) 10 MG tablet  Daily        03/16/21 2208  Milton Ferguson, MD 03/18/21 1006

## 2021-03-16 NOTE — ED Triage Notes (Signed)
Pt with sob and coughing since yesterday, tested at home for Covid and was positive. Has been using her inhaler and neb tx.

## 2021-03-18 ENCOUNTER — Ambulatory Visit: Payer: Medicare HMO | Admitting: Cardiology

## 2021-03-20 ENCOUNTER — Other Ambulatory Visit: Payer: Self-pay | Admitting: Internal Medicine

## 2021-05-27 ENCOUNTER — Other Ambulatory Visit: Payer: Self-pay | Admitting: Student

## 2021-05-27 DIAGNOSIS — R002 Palpitations: Secondary | ICD-10-CM

## 2021-05-28 NOTE — Telephone Encounter (Signed)
This is Dr. Jacalyn Lefevre pt

## 2021-06-05 DIAGNOSIS — R109 Unspecified abdominal pain: Secondary | ICD-10-CM | POA: Diagnosis not present

## 2021-06-26 ENCOUNTER — Encounter: Payer: Self-pay | Admitting: Podiatry

## 2021-06-26 ENCOUNTER — Other Ambulatory Visit: Payer: Self-pay

## 2021-06-26 ENCOUNTER — Ambulatory Visit: Payer: Medicare HMO | Admitting: Podiatry

## 2021-06-26 DIAGNOSIS — D2371 Other benign neoplasm of skin of right lower limb, including hip: Secondary | ICD-10-CM | POA: Diagnosis not present

## 2021-06-26 DIAGNOSIS — D2372 Other benign neoplasm of skin of left lower limb, including hip: Secondary | ICD-10-CM

## 2021-06-26 DIAGNOSIS — M778 Other enthesopathies, not elsewhere classified: Secondary | ICD-10-CM | POA: Diagnosis not present

## 2021-06-26 NOTE — Progress Notes (Signed)
She presents today for follow-up of her calluses subfifth metatarsal head bilaterally.  States they are exquisitely painful left greater than right.  Objective: Vital signs are stable she is alert oriented x3 there is no erythema edema/drainage or odor benign skin lesions plantar aspect fifth metatarsal heads bilaterally.  No fluctuance beneath the tissues.  Assessment: Benign skin lesion subsecond bilateral.  Plan: Debrided benign skin lesions of fifth met head bilateral.  Follow-up as needed

## 2021-07-03 DIAGNOSIS — M340 Progressive systemic sclerosis: Secondary | ICD-10-CM | POA: Diagnosis not present

## 2021-07-03 DIAGNOSIS — L03011 Cellulitis of right finger: Secondary | ICD-10-CM | POA: Diagnosis not present

## 2021-07-03 DIAGNOSIS — J849 Interstitial pulmonary disease, unspecified: Secondary | ICD-10-CM | POA: Diagnosis not present

## 2021-07-03 DIAGNOSIS — R140 Abdominal distension (gaseous): Secondary | ICD-10-CM | POA: Diagnosis not present

## 2021-07-03 DIAGNOSIS — M5126 Other intervertebral disc displacement, lumbar region: Secondary | ICD-10-CM | POA: Diagnosis not present

## 2021-07-03 DIAGNOSIS — I7300 Raynaud's syndrome without gangrene: Secondary | ICD-10-CM | POA: Diagnosis not present

## 2021-07-03 DIAGNOSIS — M150 Primary generalized (osteo)arthritis: Secondary | ICD-10-CM | POA: Diagnosis not present

## 2021-07-03 DIAGNOSIS — Z6833 Body mass index (BMI) 33.0-33.9, adult: Secondary | ICD-10-CM | POA: Diagnosis not present

## 2021-07-03 DIAGNOSIS — M0579 Rheumatoid arthritis with rheumatoid factor of multiple sites without organ or systems involvement: Secondary | ICD-10-CM | POA: Diagnosis not present

## 2021-07-07 DIAGNOSIS — R109 Unspecified abdominal pain: Secondary | ICD-10-CM | POA: Diagnosis not present

## 2021-08-05 DIAGNOSIS — M340 Progressive systemic sclerosis: Secondary | ICD-10-CM | POA: Diagnosis not present

## 2021-08-05 DIAGNOSIS — R140 Abdominal distension (gaseous): Secondary | ICD-10-CM | POA: Diagnosis not present

## 2021-08-21 DIAGNOSIS — Z1231 Encounter for screening mammogram for malignant neoplasm of breast: Secondary | ICD-10-CM | POA: Diagnosis not present

## 2021-08-21 DIAGNOSIS — Z6833 Body mass index (BMI) 33.0-33.9, adult: Secondary | ICD-10-CM | POA: Diagnosis not present

## 2021-08-21 DIAGNOSIS — Z124 Encounter for screening for malignant neoplasm of cervix: Secondary | ICD-10-CM | POA: Diagnosis not present

## 2021-08-29 DIAGNOSIS — M349 Systemic sclerosis, unspecified: Secondary | ICD-10-CM | POA: Diagnosis not present

## 2021-09-08 DIAGNOSIS — Z23 Encounter for immunization: Secondary | ICD-10-CM | POA: Diagnosis not present

## 2021-10-29 DIAGNOSIS — I10 Essential (primary) hypertension: Secondary | ICD-10-CM | POA: Diagnosis not present

## 2021-10-29 DIAGNOSIS — M349 Systemic sclerosis, unspecified: Secondary | ICD-10-CM | POA: Diagnosis not present

## 2021-11-01 IMAGING — CT CT CHEST HIGH RESOLUTION W/O CM
2 of 6 series · 14 of 36 positions shown, 17 images · non-contrast
Comparison: High-resolution chest CT 03/16/2018.

CLINICAL DATA: 73-year-old female with history of worsening and
persistent shortness of breath.

EXAM:
CT CHEST WITHOUT CONTRAST
TECHNIQUE: Multidetector CT imaging of the chest was performed following the
standard protocol without intravenous contrast. High resolution
imaging of the lungs, as well as inspiratory and expiratory imaging,
was performed.

[Series 4: thorax · axial · 0.66mm/px · z∈[+1226,+1458]mm · 11 of 130 slices shown, 14 images]
[im 7/130  mediastinal]
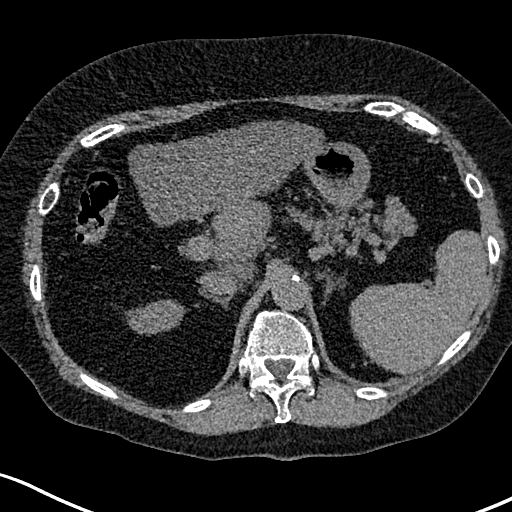
[im 7/130  lung]
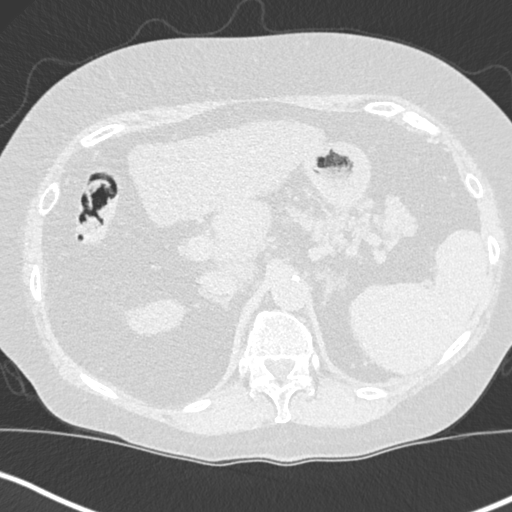
[im 21/130  lung]
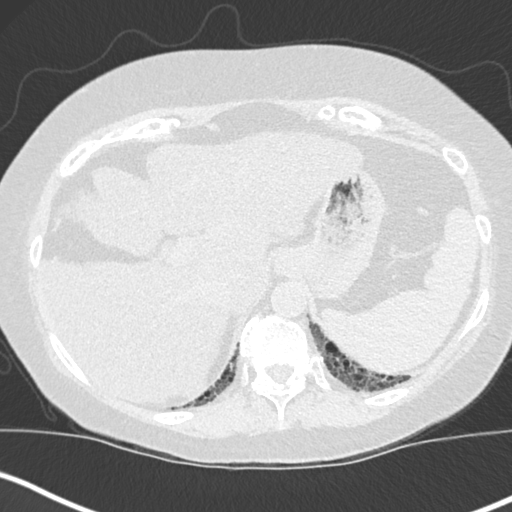
[im 34/130  lung]
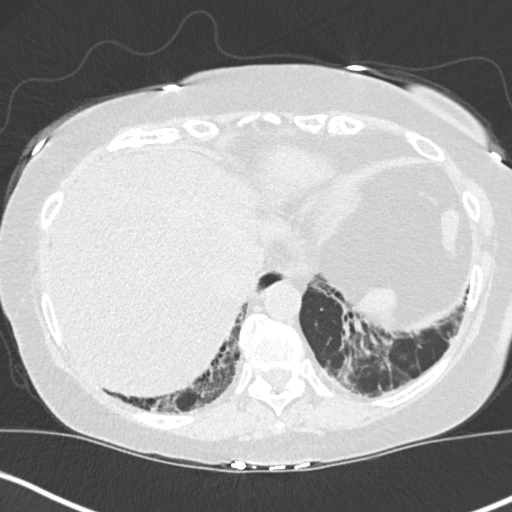
[im 41/130  lung]
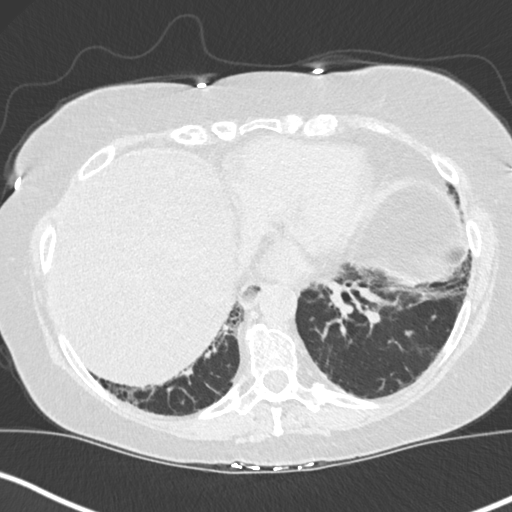
[im 55/130  mediastinal]
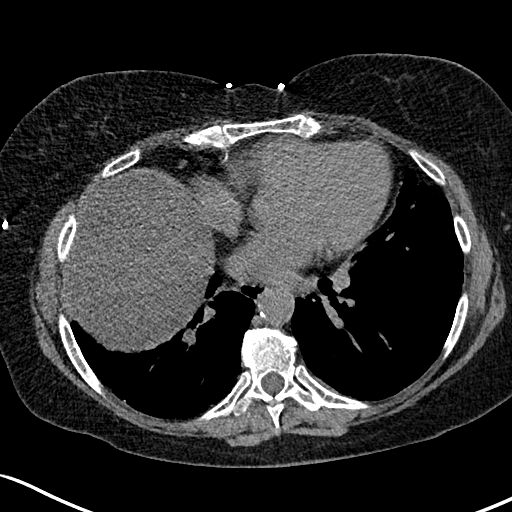
[im 55/130  lung]
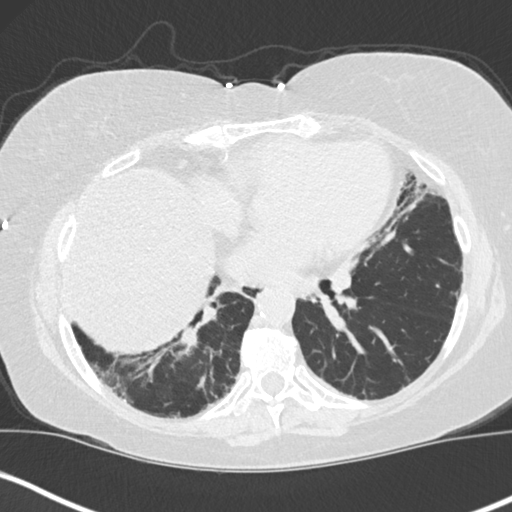
[im 68/130  lung]
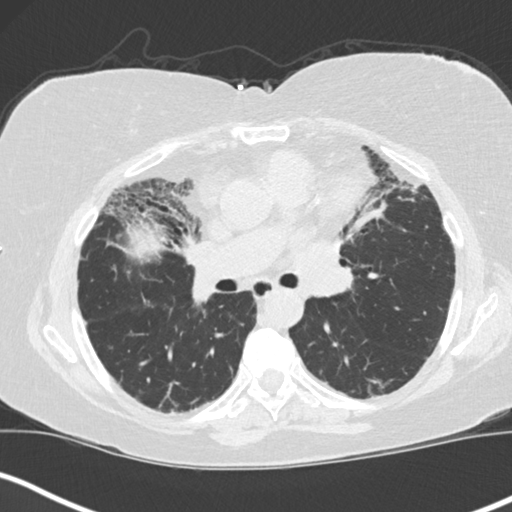
[im 75/130  lung]
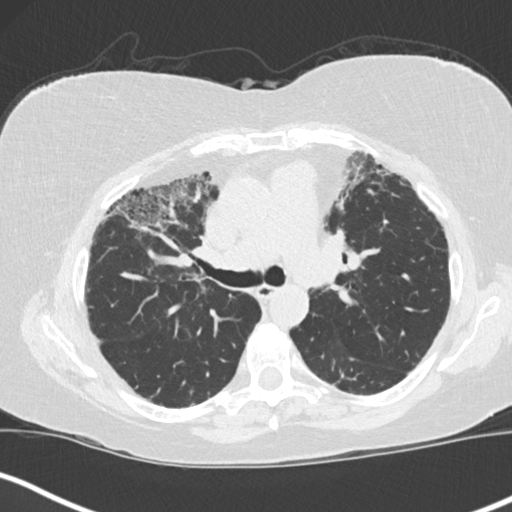
[im 89/130  lung]
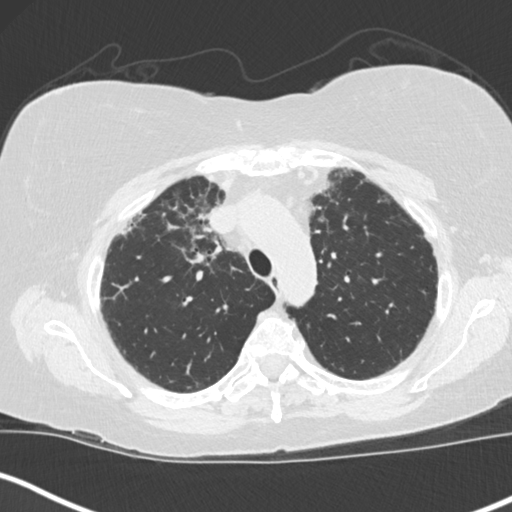
[im 96/130  mediastinal]
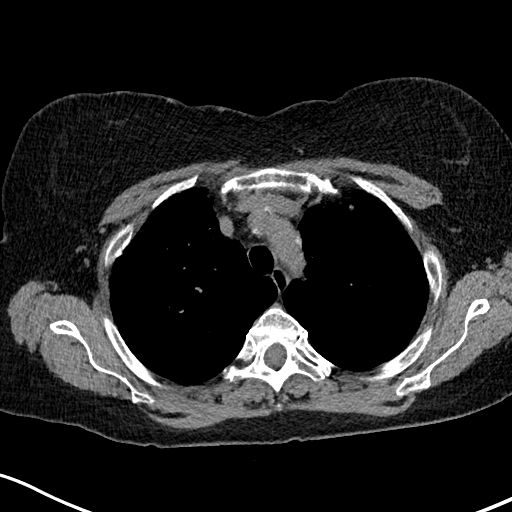
[im 96/130  lung]
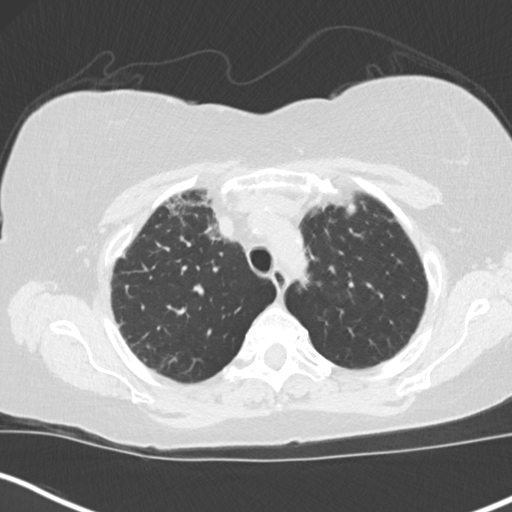
[im 109/130  lung]
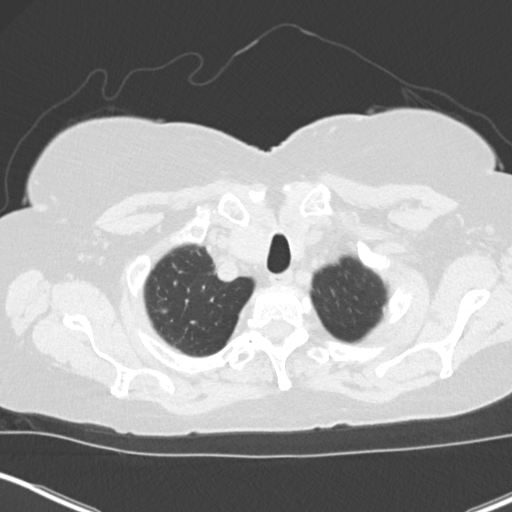
[im 123/130  lung]
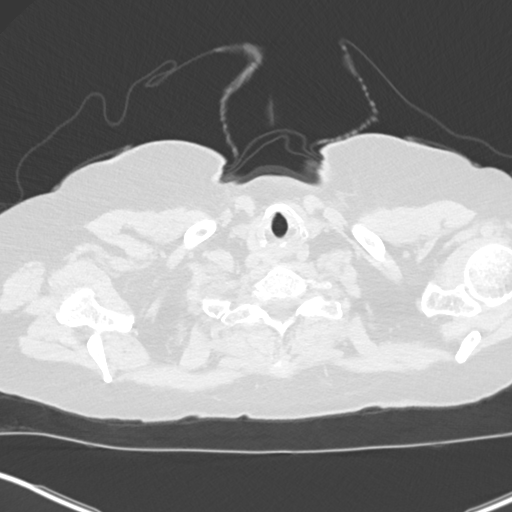

[Series 11: coronal · coronal · 0.53mm/px · 3 of 151 slices shown]
[im 31/151  lung]
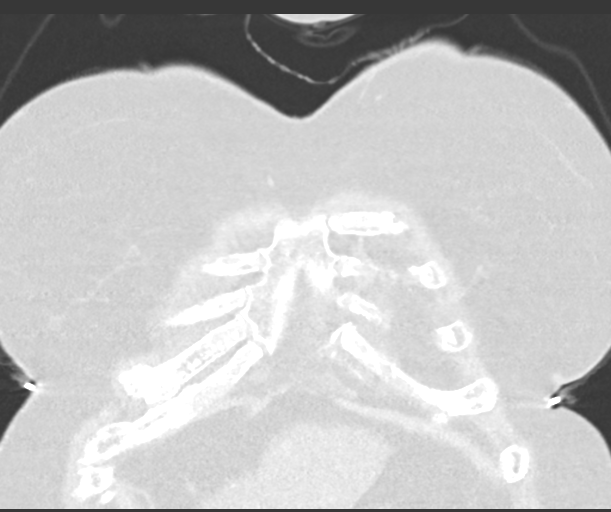
[im 61/151  lung]
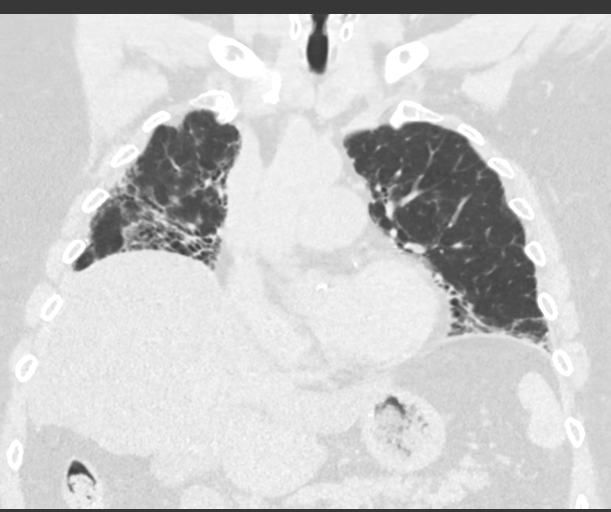
[im 91/151  lung]
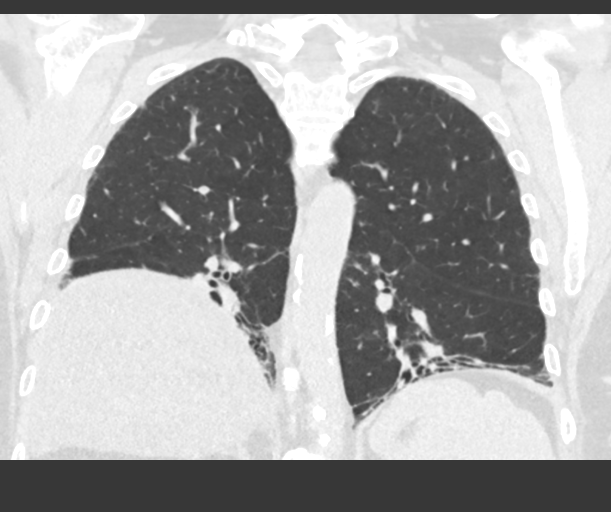

[14 of 36 positions shown; findings below may reference images not displayed]

FINDINGS: Cardiovascular: Heart size is normal. There is no significant
pericardial fluid, thickening or pericardial calcification. There is
aortic atherosclerosis, as well as atherosclerosis of the great
vessels of the mediastinum and the coronary arteries, including
calcified atherosclerotic plaque in the left main, left anterior
descending, left circumflex and right coronary arteries.

Mediastinum/Nodes: No pathologically enlarged mediastinal or hilar
lymph nodes. Please note that accurate exclusion of hilar adenopathy
is limited on noncontrast CT scans. Esophagus is unremarkable in
appearance. No axillary lymphadenopathy.

Lungs/Pleura: High-resolution images again demonstrate widespread
areas of septal thickening, subpleural reticulation, traction
bronchiectasis, peripheral bronchiolectasis and honeycombing.
Findings are most evident throughout the mid to lower lungs,
although there are some portions of the lung bases which are spared.
Findings are essentially stable compared to the prior examination.
Inspiratory and expiratory imaging is unremarkable. No acute
consolidative airspace disease. No pleural effusions. No definite
suspicious appearing pulmonary nodules or masses are noted.

Upper Abdomen: Diffuse low attenuation throughout the visualized
hepatic parenchyma, indicative of hepatic steatosis. Status post
cholecystectomy. Aortic atherosclerosis.

Musculoskeletal: There are no aggressive appearing lytic or blastic
lesions noted in the visualized portions of the skeleton.
IMPRESSION: 1. There is a spectrum of findings in the lungs compatible with
interstitial lung disease, which appear stable compared to the prior
examination. This is technically characterized as compatible with
usual interstitial pneumonia (UIP) per current ATS guidelines.
However, given the stability dating back to at least 7609 and the
overall appearance, this is again favored to reflect fibrotic phase
nonspecific interstitial pneumonia (NSIP).
2. Aortic atherosclerosis, in addition to left main and 3 vessel
coronary artery disease. Assessment for potential risk factor
modification, dietary therapy or pharmacologic therapy may be
warranted, if clinically indicated.
3. Hepatic steatosis.

Aortic Atherosclerosis ([id number]).

## 2021-11-30 HISTORY — PX: EYE SURGERY: SHX253

## 2022-01-06 DIAGNOSIS — M340 Progressive systemic sclerosis: Secondary | ICD-10-CM | POA: Diagnosis not present

## 2022-01-06 DIAGNOSIS — I7300 Raynaud's syndrome without gangrene: Secondary | ICD-10-CM | POA: Diagnosis not present

## 2022-01-06 DIAGNOSIS — J849 Interstitial pulmonary disease, unspecified: Secondary | ICD-10-CM | POA: Diagnosis not present

## 2022-01-06 DIAGNOSIS — M5126 Other intervertebral disc displacement, lumbar region: Secondary | ICD-10-CM | POA: Diagnosis not present

## 2022-01-06 DIAGNOSIS — M150 Primary generalized (osteo)arthritis: Secondary | ICD-10-CM | POA: Diagnosis not present

## 2022-01-06 DIAGNOSIS — L03011 Cellulitis of right finger: Secondary | ICD-10-CM | POA: Diagnosis not present

## 2022-01-06 DIAGNOSIS — R140 Abdominal distension (gaseous): Secondary | ICD-10-CM | POA: Diagnosis not present

## 2022-01-06 DIAGNOSIS — Z6835 Body mass index (BMI) 35.0-35.9, adult: Secondary | ICD-10-CM | POA: Diagnosis not present

## 2022-01-06 DIAGNOSIS — M0579 Rheumatoid arthritis with rheumatoid factor of multiple sites without organ or systems involvement: Secondary | ICD-10-CM | POA: Diagnosis not present

## 2022-01-15 DIAGNOSIS — R051 Acute cough: Secondary | ICD-10-CM | POA: Diagnosis not present

## 2022-02-19 DIAGNOSIS — R739 Hyperglycemia, unspecified: Secondary | ICD-10-CM | POA: Diagnosis not present

## 2022-02-19 DIAGNOSIS — L570 Actinic keratosis: Secondary | ICD-10-CM | POA: Diagnosis not present

## 2022-03-16 DIAGNOSIS — Z79899 Other long term (current) drug therapy: Secondary | ICD-10-CM | POA: Diagnosis not present

## 2022-03-16 DIAGNOSIS — M069 Rheumatoid arthritis, unspecified: Secondary | ICD-10-CM | POA: Diagnosis not present

## 2022-03-16 DIAGNOSIS — H25813 Combined forms of age-related cataract, bilateral: Secondary | ICD-10-CM | POA: Diagnosis not present

## 2022-03-16 DIAGNOSIS — H04123 Dry eye syndrome of bilateral lacrimal glands: Secondary | ICD-10-CM | POA: Diagnosis not present

## 2022-03-16 DIAGNOSIS — H00015 Hordeolum externum left lower eyelid: Secondary | ICD-10-CM | POA: Diagnosis not present

## 2022-04-08 DIAGNOSIS — M069 Rheumatoid arthritis, unspecified: Secondary | ICD-10-CM | POA: Diagnosis not present

## 2022-04-08 DIAGNOSIS — H25812 Combined forms of age-related cataract, left eye: Secondary | ICD-10-CM | POA: Diagnosis not present

## 2022-04-08 DIAGNOSIS — Z79899 Other long term (current) drug therapy: Secondary | ICD-10-CM | POA: Diagnosis not present

## 2022-04-23 ENCOUNTER — Ambulatory Visit: Payer: Medicare HMO | Admitting: Podiatry

## 2022-04-23 DIAGNOSIS — L603 Nail dystrophy: Secondary | ICD-10-CM | POA: Diagnosis not present

## 2022-04-23 DIAGNOSIS — D2372 Other benign neoplasm of skin of left lower limb, including hip: Secondary | ICD-10-CM

## 2022-04-23 DIAGNOSIS — D2371 Other benign neoplasm of skin of right lower limb, including hip: Secondary | ICD-10-CM

## 2022-04-26 NOTE — Progress Notes (Signed)
She presents today states that she is got 1 toenail is trying to come off that she refers to the hallux left.  She also has painful calluses bilaterally.  Objective: Vital signs stable alert oriented x3.  Nail dystrophy to the hallux left with distal onycholysis.  No signs of infection.  She also has porokeratotic lesions plantar aspect of the fifth metatarsal heads bilateral.  Assessment: Benign skin lesions of fifth metatarsal heads bilateral.  Nail dystrophy hallux left.  Plan: Debrided the hallux nail left.  Smoothed that down.  Also debrided all benign skin lesions.

## 2022-05-19 ENCOUNTER — Telehealth: Payer: Self-pay | Admitting: Internal Medicine

## 2022-05-20 NOTE — Telephone Encounter (Signed)
Spoke to patient.  She is requesting refill on Atrovent nasal spray. According to our records, it does not appear that MR prescribed this medication. She stated that she would contact PCP for refills. Nothing further needed.

## 2022-06-12 DIAGNOSIS — H25812 Combined forms of age-related cataract, left eye: Secondary | ICD-10-CM | POA: Diagnosis not present

## 2022-06-12 DIAGNOSIS — H52222 Regular astigmatism, left eye: Secondary | ICD-10-CM | POA: Diagnosis not present

## 2022-06-19 ENCOUNTER — Telehealth: Payer: Self-pay | Admitting: Internal Medicine

## 2022-06-19 ENCOUNTER — Ambulatory Visit: Payer: Medicare HMO | Admitting: Internal Medicine

## 2022-06-19 ENCOUNTER — Encounter: Payer: Self-pay | Admitting: Internal Medicine

## 2022-06-19 VITALS — BP 110/60 | HR 90 | Temp 98.4°F | Ht 63.0 in | Wt 197.8 lb

## 2022-06-19 DIAGNOSIS — J8489 Other specified interstitial pulmonary diseases: Secondary | ICD-10-CM | POA: Diagnosis not present

## 2022-06-19 DIAGNOSIS — M359 Systemic involvement of connective tissue, unspecified: Secondary | ICD-10-CM

## 2022-06-19 DIAGNOSIS — M349 Systemic sclerosis, unspecified: Secondary | ICD-10-CM | POA: Diagnosis not present

## 2022-06-19 NOTE — Patient Instructions (Addendum)
Interstitial lung disease due to connective tissue disease ([place]) Scleroderma ([place]) with RA overalap  - pulmonary fibrosis stable on CT 2013 -> 2022 on CT but  breathing test over years through sept 2021 show potential decline and walk test 06/19/2022 is concerning for decline  - best to restage and reasess treatment need  Plan - - do HRCT supine and prone in next few to several weeks - do ECHO net few to several weeks - do spiro/dlco next few to several weeks - do ono test on room air - will connect you with Butch Penny the support group leader  Followup  30 min slot in <= 8 weeks to see Dr Chase Caller but after tests done  - discuss therapies if applicable

## 2022-06-19 NOTE — Progress Notes (Signed)
OV 11/27/2019  Subjective:  Patient ID: [name], female , DOB: [birth year] , age 76 y.o. , MRN: [id number] , ADDRESS: [place] [id number]   11/27/2019 -   Chief Complaint  Patient presents with   Follow-up    Former BQ pt. Pt states she does become SOB with activities and also at times when at rest. Pt also has an occ cough which is worse in the morning and will cough up clear phlegm.   Follow-up interstitial lung disease secondary to connective tissue disease [rheumatologist [name], M.D., M.H.S. at [facility] but now Dr [name] at  [facility])  ILD: Dr [name] -> Dr [name] -> DR [name] at ILD center [place] ,  HPI [name] 76 y.o. -presents to the ILD clinic for evaluation of her interstitial lung disease secondary to connective tissue disease (rheumatoid factor 90, ANA 1: 640, ANA second titer 1: 2000 560, positive SCL 70 August 2012 at [facility] and positive [place].  I am meeting her for the first time.  Her rheumatology situation is managed by Dr. Richardson [name] at [place].  She no longer follows at [facility] after the doctor they relocated to [place].  She is maintained on 5 mg prednisone per day.  She is not on any other immunomodulators.  Her last TNF alpha blockade was over 3 years ago.  She is known to have interstitial lung disease.  Last staged in March 2019.  She tells me that since then she continues to have significant amount of cough and shortness of breath.  She believes the shortness of breath is slightly worse.  She thinks her pulmonary issues itself is stable.  She thinks the worsening shortness of breath and the persistent shortness of breath could be due to cardiac issues.  In 2015 she had a right heart catheterization evaluation that was normal.  [name] was in 2015.  No further work-up since then.  Dyspnea symptom score is listed below.  She in addition she also has significant amount  of cough that she attributes to sinus drainage.  She is not had a CT scan of the sinus.  She says she is allergic to a few things but last tested several years ago.  She does not remember when.  She has seen ENT many years ago does not know the details.  At this point in time she would love to have her symptoms better managed.  IMPRESSION: HRCT Lungs/Pleura: Subpleural reticulation and traction bronchiectasis/bronchiolectasis are again seen without a definite zonal predominance. Findings are worst in the right middle lobe. Pattern and distribution appear stable from prior exams. No pleural fluid. Airway is unremarkable. No air trapping. 1. Pulmonary parenchymal pattern of fibrosis is stable from prior exams and likely due to fibrotic nonspecific interstitial pneumonitis. 2. Cirrhosis, steatosis. 3. Aortic atherosclerosis (ICD10-170.0). Coronary artery calcification.     Electronically Signed   By: [name] M.D.   On: 03/17/2018 09:09  ROS - per HPI    OV [name]  Subjective:  Patient ID: [name], female , DOB: 10-Apr-1946 , age 76 y.o. , MRN: [id number] , ADDRESS: [address] [place] [address]   [name] -   Chief Complaint  Patient presents with   Follow-up    Pt states she has been doing well since last visit and denies any complaints.   Follow-up interstitial lung disease secondary to connective tissue disease  [rheumatologist previously - [employer]  [name], M.D., M.H.S. at [facility] but now Dr [name] at  [facility])  - per records - ILD since 2009  - systemic sclerosis/RA overlap per [facility] notes  = Telangiectasias, Raynaud's, esophageal dysmotility - ? calcinosis on right heel. Per [name] notes  - Serology   - 2012 at [place]: (rheumatoid factor 90, ANA 1: 640, ANA second titer 1: 2000 560, positive SCL 70 August 2012).   -  [name]: RF + (low titer), ANA + (low titer), SCl 70 +  (in [name])  - ILD: Dr [name] -> Dr [name] -> DR [name] at ILD center  [place]   Rx History - all [place] initiated 12/2011 d/c due to lack of control of arthrits D. MTX initiated 04/2012 -discontinued due to concerns since patient has underlying lung disease   D- CHF - 2015 [facility]   - She had a right heart cath in April 2015 which showed the following:  Hemodynamic Findings:   PA: 43/18 (mean 31)   PCWP: 25   Fick Cardiac Index: 3.45 L/min/m2     xxxxxxxxxxxxxxxxxxxxxxxxxxxxxxxxxxxxxxxxxxxxxxxxxxxxxx HPI [name] 76 y.o. -returns for follow-up to discuss her test results.  She has interstitial lung disease secondary to connective tissue disease.  She has not been able to do her pulmonary function test because of the COVID-19 pandemic and the disruptions.  It is scheduled in the next few to several weeks.  She did have repeat serology and the overwhelmingly strong antibodies to scleroderma antibody.  The rheumatoid factor and ANA weakly positive only.  Her high-resolution CT chest scan of the chest according to thoracic radiology that I personally visualized shows stable pattern compared to 2013.  This is somewhat discrepant with the pulmonary function test that is shown decline and the symptoms are showing decline.  Of course the upcoming pulmonary function test is not available.  Nevertheless it is reassuring that the CT scan is stable.  We discussed her symptoms and it appears she does have diastolic dysfunction is based on the Vickie.  Clinically she is euvolemic.  She is also physically deconditioned and the BMI is high.  Her other big symptom burden is a cough secondary to significant postnasal drainage.  The CT scan of the sinus itself is normal.  She does have some deviated nasal septum.  Her blood IgE is normal. ANCA is normal 0 S1erology [name]  Results for Vickie, [name] (MRN [id number]) as of [name] 11:49  Ref. Range 11/27/2019 12:43  Anti Nuclear Antibody (ANA) Latest Ref Range: NEGATIVE  POSITIVE (A)  ANA Pattern 1 Unknown Nuclear,  Speckled (A)  ANA Titer 1 Latest Units: titer 1:74 (H)  Cyclic Citrullin Peptide Ab Latest Units: UNITS <16  Myeloperoxidase Abs Latest Units: AI <1.0  Serine Protease 3 Latest Units: AI <1.0  RA Latex Turbid. Latest Ref Range: <14 IU/mL 17 (H)     IMPRESSIONS - [name]      1. Left ventricular ejection fraction, by visual estimation, is 60 to 65%. The left ventricle has normal function. There is moderately increased left ventricular hypertrophy.  2. Left ventricular diastolic parameters are consistent with Grade I diastolic dysfunction (impaired relaxation).  3. The left ventricle has no regional wall motion abnormalities.  4. Global right ventricle has normal systolic function.The right ventricular size is normal. No increase in right ventricular wall thickness.  5. Left atrial size was mildly dilated.  6. Right atrial size was normal.  7. The mitral  valve is normal in structure. No evidence of mitral valve regurgitation. No evidence of mitral stenosis.  8. The tricuspid valve is normal in structure.  9. The tricuspid valve is normal in structure. Tricuspid valve regurgitation is not demonstrated. 10. The aortic valve is tricuspid. Aortic valve regurgitation is not visualized. No evidence of aortic valve sclerosis or stenosis. 11. The pulmonic valve was not well visualized. Pulmonic valve regurgitation is not visualized. 12. The inferior vena cava is normal in size with greater than 50% respiratory variability, suggesting right atrial pressure of 3 mmHg.     Results for CHAYE, MISCH (MRN [id number]) as of 11/27/2019 12:15  Ref. Range 09/05/2013 15:00 03/07/2014 12:09 02/07/2015 10:34 09/22/2016 15:33 02/25/2018 14:59  FVC-Pre Latest Units: L ? 2.03 2.04 1.97 1.94 1.75  FVC-%Pred-Pre Latest Units: % 68 70 68 68 60   Results for DONTAVIA, BRAND (MRN [id number]) as of 11/27/2019 12:15  Ref. Range 09/05/2013 15:00 03/07/2014 12:09 02/07/2015 10:34 09/22/2016 15:33 02/25/2018 14:59   DLCO unc Latest Units: ml/min/mmHg 14.81 14.71 15.60 15.97 16.44  DLCO unc % pred Latest Units: % 68 68 72 74 71    IMPRESSION: HRCT [name] 1. There is a spectrum of findings in the lungs compatible with interstitial lung disease, which appear stable compared to the prior examination. This is technically characterized as compatible with usual interstitial pneumonia (UIP) per current ATS guidelines. However, given the stability dating back to at least 2013 and the overall appearance, this is again favored to reflect fibrotic phase nonspecific interstitial pneumonia (NSIP). 2. Aortic atherosclerosis, in addition to left main and 3 vessel coronary artery disease. Assessment for potential risk factor modification, dietary therapy or pharmacologic therapy may be warranted, if clinically indicated. 3. Hepatic steatosis.   Aortic Atherosclerosis (ICD10-I70.0).     Electronically Signed   By: Vinnie Langton M.D.   On: 01/19/[name] 14:33  CT SINUS [name]   EXAM: CT PARANASAL SINUS LIMITED WITHOUT CONTRAST   TECHNIQUE: Non-contiguous multidetector CT images of the paranasal sinuses were obtained in a single plane without contrast.   COMPARISON:  None.   FINDINGS: The included portions of the paranasal sinuses are clear without evidence of significant mucosal thickening or fluid. Rightward nasal septal deviation measures 3 mm. The regional soft tissues are grossly unremarkable.   IMPRESSION: Clear paranasal sinuses.     Electronically Signed   By: Logan Bores M.D.   On: 01/19/[name] 14:15    [name]  IMPRESSIONS      1. Left ventricular ejection fraction, by visual estimation, is 60 to 65%. The left ventricle has normal function. There is moderately increased left ventricular hypertrophy.  2. Left ventricular diastolic parameters are consistent with Grade I diastolic dysfunction (impaired relaxation).  3. The left ventricle has no regional wall motion  abnormalities.  4. Global right ventricle has normal systolic function.The right ventricular size is normal. No increase in right ventricular wall thickness.  5. Left atrial size was mildly dilated.  6. Right atrial size was normal.  7. The mitral valve is normal in structure. No evidence of mitral valve regurgitation. No evidence of mitral stenosis.  8. The tricuspid valve is normal in structure.  9. The tricuspid valve is normal in structure. Tricuspid valve regurgitation is not demonstrated. 10. The aortic valve is tricuspid. Aortic valve regurgitation is not visualized. No evidence of aortic valve sclerosis or stenosis. 11. The pulmonic valve was not well visualized. Pulmonic valve regurgitation is not visualized. 12.  The inferior vena cava is normal in size with greater than 50% respiratory variability, suggesting right atrial pressure of 3 mmHg.    OV 9/9/[name]  Subjective:  Patient ID: [name], female , DOB: Mar 29, 1946 , age 89 y.o. , MRN: [id number] , ADDRESS: [address] [place] [address]   9/9/[name] -   Chief Complaint  Patient presents with   Follow-up    here to go over pft results    Follow-up interstitial lu HPI [name] 76 y.o. -presents for the above issues.  She is on observation therapy.  Overall she states that her shortness of breath and symptoms are the same although she is quite symptomatic.  She understands that she has multifactorial dyspnea from obesity, diastolic dysfunction, fitness and ILD.  She had a pulmonary function test that is documented below.  Shows continued decline in FVC but the DLCO is stable.  She could not attend pulmonary rehabilitation because sometime in the spring I suppose she fractured her left hand.  She is willing to reattend pulmonary rehabilitation.  We discussed the idea of starting antifibrotic's given the fact she has ILD associated with scleroderma.  But she has baseline mild diarrhea which is from irritable bowel  syndrome.  She says that GI has cleared her and she is no longer at follow-up with them.  But she is afraid to take nintedanib because of the diarrhea.  We discussed the alternative of taking immunosuppression such as CellCept and or Tocilizumab but she is definitely resistant to this idea because of the immunomodulation  In terms of vaccine: She is in need of having the new Shingrix vaccine.  She can benefit from a Covid booster.  She can also benefit from high-dose flu shot.  We discussed all this.      OV 06/19/2022  Subjective:  Patient ID: [name], female , DOB: 02-07-1946 , age 52 y.o. , MRN: [id number] , ADDRESS: [place] [id number] PCP Asencion Noble, MD Patient Care Team: Asencion Noble, MD as PCP - General (Internal Medicine) Stanford Breed Denice Bors, MD as PCP - Cardiology (Cardiology) Elsie Stain, MD as Attending Physician (Pulmonary Disease)  This Provider for this visit: Treatment Team:  Attending Provider: Brand Males, MD    06/19/2022 -   Chief Complaint  Patient presents with   Follow-up    Follow-up SOB, ILD, with normal cough.   ILD secondary to connective tissue disease  [rheumatologist previously - Beatriz Stallion, M.D., M.H.S. at [facility] but now Dr [name] at  [facility])  - per records - ILD since 2009  - systemic sclerosis/RA overlap per [facility] notes  = Telangiectasias, Raynaud's, esophageal dysmotility - ? calcinosis on right heel. Per [name] notes  - Serology   - 2012 at [place]: (rheumatoid factor 90, ANA 1: 640, ANA second titer 1: 2000 560, positive SCL 70 August 2012).   -  [name]: RF + (low titer), ANA + (low titer), SCl 70 +  (in [name])  -High-resolution CT chest January [name] with UIP features [stable since 2013]  - ILD: Dr [name] -> Dr [name] -> DR [name] at ILD center [place] initiated 12/2011 d/c due to lack of control of arthrits . MTX initiated 04/2012 -discontinued due to concerns  since patient has underlying lung disease Plaquenil and prednisone as of July 2023   D- CHF -   RHC  -  -  She had a right heart cath in April 2015 which showed the following: Hemodynamic Findings:   PA: 43/18 (mean 31)   PCWP: 25   Fick Cardiac Index: 3.45 L/min/m2    -Grade 1 diastolic dysfunction on echocardiogram January [name]   HPI [name] 76 y.o. -returns for follow-up.  I personally have not seen this in September [name].  She states that she did not follow-up other than 1 seeing the nurse practitioner because overall she was doing well.  She felt she did not see need to.  She is aware in the past that we discussed antifibrotic nintedanib because of the UIP pattern that she has on her CT chest.  She seems more interested in therapeutic options at this visit.  She initially said no change in her dyspnea but as I spoke to her she said maybe she is slightly worse.  She says that when she fixes do not takes a shower she has to take 5-10 minutes rest because of shortness of breath and then she exerts.  Overall dyspnea score is stable but dyspnea for stairs is worse.  There is no interim hospitalization or ER visits or medication changes.  She has recently had cataract surgery and has a pending right cataract surgery.  We discussed patient support group.  She is very interested in connecting with Ms. Butch Penny who is his local support group leader.  Her husband is here with her.  Husband is sleep apnea and has new consult visit July 01, 2022 with Geraldo Pitter.  He had some questions.  I have asked the CMA to record his questions and send it to Geraldo Pitter.    SYMPTOM SCALE - ILD [name]  9/9/[name] 198# 06/19/2022 197#  O2 use ra ra ra  Shortness of Breath 0 -> 5 scale with 5 being worst (score 6 If unable to do)    At rest 0 0 0  Simple tasks - showers, clothes change, eating, shaving _0 Household (dishes, doing bed, laundry) _1 Walking level at own pace 3 2  1.5  Walking up Stairs _2 Total (30-36) Dyspnea Score 14 12 8.5  How bad is your cough? 3 3 At times  How bad is your fatigue _3 How bad is nausea 3 2 0  How bad is vomiting?  0 0 0  How bad is diarrhea? 3 0 1  How bad is anxiety? _4 How bad is depression _5 Simple office walk 185 feet x  3 laps goal with forehead probe 9/9/[name]  06/19/2022   O2 used ra ra  Number laps completed 3 3  Comments about pace Mild steady pace Slow pace  Resting Pulse Ox/HR 96% and 79/min 100% and 90  Final Pulse Ox/HR 94% and 101/min 94% and HR 108  Desaturated </= 88% no no  Desaturated <= 3% points no Yes, 6  Got Tachycardic >/= 90/min yes yes  Symptoms at end of test none Some dyspnea  Miscellaneous comments x     PFT     Latest Ref Rng & Units 8/24/[name]   11:58 AM 02/25/2018    2:59 PM 09/22/2016    3:33 PM 02/07/2015   10:34 AM 03/07/2014   12:09 PM 09/05/2013    3:00 PM  PFT Results  FVC-Pre L 1.70  1.75  1.94  1.97  2.04  FVC-Predicted Pre % 60  60  68  68  70  68      FVC-Post L  1.78  1.92  1.94   2.03      FVC-Predicted Post %  61  68  67   69      Pre FEV1/FVC % % 73  77  74  76  75  79      Post FEV1/FCV % %  78  79  80   82      FEV1-Pre L 1.25  1.35  1.44  1.50  1.54  1.57      FEV1-Predicted Pre % 59  62  67  68  69  70      FEV1-Post L  1.38  1.51  1.56   1.66      DLCO uncorrected ml/min/mmHg 16.83  16.44  15.97  15.60  14.71  14.81      DLCO UNC% % 90  71  74  72  68  68      DLCO corrected ml/min/mmHg 16.83   15.87      DLCO COR %Predicted % 90   73      DLVA Predicted % 142  122  114  115  111  107      TLC L   3.40  2.92   3.00      TLC % Predicted %   71  61   63      RV % Predicted %   72  50   54         This result is from an external source.    HRCT Jan 2022  Narrative & Impression  CLINICAL DATA:  Interstitial lung disease   EXAM: CT CHEST WITHOUT CONTRAST   TECHNIQUE: Multidetector CT imaging of the chest was performed following  the standard protocol without intravenous contrast. High resolution imaging of the lungs, as well as inspiratory and expiratory imaging, was performed.   COMPARISON:  01/19/[name], 03/16/2018, 03/12/2014, 07/04/2012   FINDINGS: Cardiovascular: Aortic atherosclerosis. Normal heart size. Three-vessel coronary artery calcifications. No pericardial effusion.   Mediastinum/Nodes: No enlarged mediastinal, hilar, or axillary lymph nodes. Asymmetric enlargement and nodularity of the right lobe of the thyroid, unchanged compared to prior examinations dating back to 2015. Stability for greater than 5 years implies benignity; no biopsy or followup indicated (ref: J Am Coll Radiol. 2015 Feb;12(2): 143-50). Trachea, and esophagus demonstrate no significant findings.   Lungs/Pleura: No significant interval change in moderate pulmonary fibrosis with apical to basal gradient, featuring irregular peripheral interstitial opacity, traction bronchiectasis, bronchiolectasis, and probable honeycombing at the bilateral lung bases. No significant air trapping on expiratory phase imaging. No pleural effusion or pneumothorax.   Upper Abdomen: Hepatic steatosis. Somewhat nodular contour of the liver, suggesting cirrhosis.   Musculoskeletal: No chest wall mass or suspicious bone lesions identified.   IMPRESSION: 1. No significant interval change in moderate pulmonary fibrosis with apical to basal gradient, featuring irregular peripheral interstitial opacity, traction bronchiectasis, bronchiolectasis, and probable honeycombing at the bilateral lung bases. Findings remain consistent with UIP pattern pulmonary fibrosis per consensus guidelines: Diagnosis of Idiopathic Pulmonary Fibrosis: An Official ATS/ERS/JRS/ALAT Clinical Practice Guideline. [place], Iss 5, [phone], Jul 31 2017. 2. Coronary artery disease. 3. Hepatic steatosis. Somewhat nodular contour of the liver, suggesting  cirrhosis. Correlate with biochemical findings.   Aortic Atherosclerosis (ICD10-I70.0).     Electronically Signed   By: Cristie Hem  Laqueta Carina M.D.   On: 12/10/2020 15:00       has a past medical history of Diastolic congestive heart failure ([place]), GERD (gastroesophageal reflux disease), History of blood transfusion, cholecystectomy, Interstitial lung disease ([place]), Osteoarthritis, Pulmonary fibrosis ([place]), Raynaud disease, Rheumatoid arthritis(714.0), Scleroderma ([place]), Seasonal allergies, Shortness of breath dyspnea, and Systemic sclerosis ([place]).   reports that she quit smoking about 33 years ago. Her smoking use included cigarettes. She has a 20.00 pack-year smoking history. She has never used smokeless tobacco.  Past Surgical History:  Procedure Laterality Date   ABDOMINAL HYSTERECTOMY     BLADDER SUSPENSION     CARPAL TUNNEL RELEASE     LAPAROSCOPIC CHOLECYSTECTOMY     LEFT HEART CATHETERIZATION WITH CORONARY/GRAFT ANGIOGRAM  03/15/2014   Procedure: LEFT HEART CATHETERIZATION WITH Beatrix Fetters;  Surgeon: Burnell Blanks, MD;  Location: [facility] C[facility] LAB;  Service: Cardiovascular;;   TONSILLECTOMY     TOTAL KNEE ARTHROPLASTY Left 02/11/2015   Procedure: LEFT TOTAL KNEE ARTHROPLASTY, RIGHT KNEE CORTISONE INJECTION;  Surgeon: Gaynelle Arabian, MD;  Location: [facility] ORS;  Service: Orthopedics;  Laterality: Left;   TOTAL KNEE ARTHROPLASTY Right 10/05/2016   Procedure: RIGHT TOTAL KNEE ARTHROPLASTY;  Surgeon: Gaynelle Arabian, MD;  Location: [facility] ORS;  Service: Orthopedics;  Laterality: Right;   VESICOVAGINAL FISTULA CLOSURE W/ TAH  1987    Allergies  Allergen Reactions   Codeine     Neurological problems - hallucinations, "spacey"    Immunization History  Administered Date(s) Administered   Influenza Split 08/01/2011, 09/02/2012, 08/30/2013, 08/29/2016   Influenza, High Dose Seasonal PF 08/28/2017   Influenza,inj,Quad PF,6+ Mos 09/03/2014   Influenza-Unspecified 08/14/2014, 07/20/2019,  08/15/2019   Moderna Sars-Covid-2 Vaccination 12/21/2018, 02/24/[name]   Pneumococcal Conjugate-13 09/19/2014, 11/28/2014   Pneumococcal Polysaccharide-23 11/30/2008, 08/30/2009   Zoster, Live 11/30/2008    Family History  Problem Relation Age of Onset   Stroke Mother    Heart disease Mother        CABG at age 24   Heart attack Father        Died of MI at age 8   Emphysema Father    Dementia Brother    Osteoarthritis Other    Celiac disease Other      Current Outpatient Medications:    albuterol (VENTOLIN HFA) 108 (90 Base) MCG/ACT inhaler, INHALE 2 PUFFS INTO THE LUNGS EVERY 6 (SIX) HOURS AS NEEDED FOR WHEEZING., Disp: 1 each, Rfl: 1   benzonatate (TESSALON) 100 MG capsule, Take 200 mg by mouth 3 (three) times daily as needed., Disp: , Rfl:    ipratropium (ATROVENT) 0.06 % nasal spray, SMARTSIG:2 Spray(s) Both Nares Twice Daily PRN, Disp: , Rfl:    ipratropium-albuterol (DUONEB) 0.5-2.5 (3) MG/3ML SOLN, , Disp: , Rfl:    predniSONE (DELTASONE) 10 MG tablet, Take 2 tablets (20 mg total) by mouth daily., Disp: 14 tablet, Rfl: 0   triamcinolone (NASACORT) 55 MCG/ACT AERO nasal inhaler, Place 2 sprays into the nose daily., Disp: , Rfl:    acetaminophen (TYLENOL) 325 MG tablet, Take 2 tablets (650 mg total) by mouth every 6 (six) hours as needed for mild pain (or Fever >/= 101)., Disp: 40 tablet, Rfl: 0   amLODipine (NORVASC) 2.5 MG tablet, 1 tablet, Disp: , Rfl:    aspirin 81 MG chewable tablet, Chew 81 mg by mouth daily. Start on 10/26/16 after completion of Xarelto, Disp: , Rfl:    doxycycline (VIBRAMYCIN) 100 MG capsule, Take 1 capsule (100 mg total) by mouth 2 (two) times  daily. One po bid x 7 days, Disp: 14 capsule, Rfl: 0   fexofenadine-pseudoephedrine (ALLEGRA-D) 60-120 MG 12 hr tablet, Take 1 tablet by mouth daily. (Patient not taking: Reported on 06/19/2022), Disp: , Rfl:    furosemide (LASIX) 40 MG tablet, TAKE 2 TABLETS (80 MG TOTAL) BY MOUTH DAILY., Disp: 180 tablet, Rfl: 3    hydroxychloroquine (PLAQUENIL) 200 MG tablet, Take 400 mg by mouth daily., Disp: , Rfl:    hyoscyamine (LEVSIN) 0.125 MG tablet, Take by mouth 3 (three) times daily as needed., Disp: , Rfl:    pantoprazole (PROTONIX) 40 MG tablet, Take 40 mg by mouth 2 (two) times daily., Disp: , Rfl:    rosuvastatin (CRESTOR) 40 MG tablet, Take 1 tablet (40 mg total) by mouth daily., Disp: 90 tablet, Rfl: 3   sertraline (ZOLOFT) 50 MG tablet, sertraline 50 mg tablet  TAKE 1/2 (ONE-HALF) TABLET BY MOUTH ONCE DAILY FOR 6 DAYS THEN 1 ONCE DAILY (Patient not taking: Reported on 06/19/2022), Disp: , Rfl:    zolpidem (AMBIEN) 10 MG tablet, Take 5-10 mg by mouth at bedtime as needed for sleep. , Disp: , Rfl:       Objective:   Vitals:   06/19/22 1626  BP: 110/60  Pulse: 90  Temp: 98.4 F (36.9 C)  TempSrc: Oral  SpO2: 100%  Weight: 197 lb 12.8 oz (89.7 kg)  Height: _0  (1.6 m)    Estimated body mass index is 35.04 kg/m as calculated from the following:   Height as of this encounter: _1  (1.6 m).   Weight as of this encounter: 197 lb 12.8 oz (89.7 kg).  _2 @  [employer]   06/19/22 1626  Weight: 197 lb 12.8 oz (89.7 kg)     Physical Exam   General: No distress. Looks well Neuro: Alert and Oriented x 3. GCS 15. Speech normal Psych: Pleasant Resp:  Barrel Chest - no.  Wheeze - no, Crackles - mild maye, No overt respiratory distress CVS: Normal heart sounds. Murmurs - no Ext: Stigmata of Connective Tissue Disease -scleroderma present HEENT: Normal upper airway. PEERL +. No post nasal drip        Assessment:       ICD-10-CM   1. Interstitial lung disease due to connective tissue disease ([place])  J84.89    M35.9     2. 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 Brand Males, M.D., F.C.C.P,  Pulmonary and Critical Care Medicine Staff Physician, [place] Director - Interstitial Lung Disease  Program  [facility] [place] [facility] [place], [place], [address]  Pager: [phone], If no answer or between  15:00h - 7:00h: call [phone] Telephone: [phone]  5:18 PM 06/19/2022

## 2022-06-24 NOTE — Telephone Encounter (Signed)
Error

## 2022-06-26 DIAGNOSIS — H25811 Combined forms of age-related cataract, right eye: Secondary | ICD-10-CM | POA: Diagnosis not present

## 2022-07-07 DIAGNOSIS — M1991 Primary osteoarthritis, unspecified site: Secondary | ICD-10-CM | POA: Diagnosis not present

## 2022-07-07 DIAGNOSIS — J849 Interstitial pulmonary disease, unspecified: Secondary | ICD-10-CM | POA: Diagnosis not present

## 2022-07-07 DIAGNOSIS — M0579 Rheumatoid arthritis with rheumatoid factor of multiple sites without organ or systems involvement: Secondary | ICD-10-CM | POA: Diagnosis not present

## 2022-07-07 DIAGNOSIS — M5126 Other intervertebral disc displacement, lumbar region: Secondary | ICD-10-CM | POA: Diagnosis not present

## 2022-07-07 DIAGNOSIS — L03011 Cellulitis of right finger: Secondary | ICD-10-CM | POA: Diagnosis not present

## 2022-07-07 DIAGNOSIS — I7300 Raynaud's syndrome without gangrene: Secondary | ICD-10-CM | POA: Diagnosis not present

## 2022-07-07 DIAGNOSIS — M340 Progressive systemic sclerosis: Secondary | ICD-10-CM | POA: Diagnosis not present

## 2022-07-07 DIAGNOSIS — R140 Abdominal distension (gaseous): Secondary | ICD-10-CM | POA: Diagnosis not present

## 2022-07-07 DIAGNOSIS — Z6834 Body mass index (BMI) 34.0-34.9, adult: Secondary | ICD-10-CM | POA: Diagnosis not present

## 2022-07-14 ENCOUNTER — Ambulatory Visit (HOSPITAL_COMMUNITY): Payer: Medicare HMO | Attending: Cardiovascular Disease

## 2022-07-14 DIAGNOSIS — R0600 Dyspnea, unspecified: Secondary | ICD-10-CM | POA: Diagnosis not present

## 2022-07-14 DIAGNOSIS — J8489 Other specified interstitial pulmonary diseases: Secondary | ICD-10-CM | POA: Diagnosis not present

## 2022-07-14 DIAGNOSIS — R0609 Other forms of dyspnea: Secondary | ICD-10-CM

## 2022-07-14 DIAGNOSIS — M359 Systemic involvement of connective tissue, unspecified: Secondary | ICD-10-CM | POA: Diagnosis not present

## 2022-07-14 LAB — ECHOCARDIOGRAM COMPLETE
Area-P 1/2: 3.93 cm2
S' Lateral: 2.8 cm

## 2022-07-23 ENCOUNTER — Ambulatory Visit (HOSPITAL_COMMUNITY): Payer: Medicare HMO

## 2022-07-23 DIAGNOSIS — Z0100 Encounter for examination of eyes and vision without abnormal findings: Secondary | ICD-10-CM | POA: Diagnosis not present

## 2022-07-24 ENCOUNTER — Other Ambulatory Visit: Payer: Self-pay

## 2022-07-24 NOTE — Progress Notes (Unsigned)
pft

## 2022-07-27 ENCOUNTER — Ambulatory Visit (INDEPENDENT_AMBULATORY_CARE_PROVIDER_SITE_OTHER): Payer: Medicare HMO | Admitting: Internal Medicine

## 2022-07-27 DIAGNOSIS — J8489 Other specified interstitial pulmonary diseases: Secondary | ICD-10-CM | POA: Diagnosis not present

## 2022-07-27 DIAGNOSIS — M359 Systemic involvement of connective tissue, unspecified: Secondary | ICD-10-CM

## 2022-07-27 LAB — PULMONARY FUNCTION TEST
DL/VA % pred: 122 %
DL/VA: 5.08 ml/min/mmHg/L
DLCO cor % pred: 77 %
DLCO cor: 14.37 ml/min/mmHg
DLCO unc % pred: 77 %
DLCO unc: 14.37 ml/min/mmHg
FEF 25-75 Pre: 0.88 L/sec
FEF2575-%Pred-Pre: 53 %
FEV1-%Pred-Pre: 56 %
FEV1-Pre: 1.15 L
FEV1FVC-%Pred-Pre: 101 %
FEV6-%Pred-Pre: 58 %
FEV6-Pre: 1.51 L
FEV6FVC-%Pred-Pre: 104 %
FVC-%Pred-Pre: 55 %
FVC-Pre: 1.51 L
Pre FEV1/FVC ratio: 76 %
Pre FEV6/FVC Ratio: 100 %

## 2022-07-27 NOTE — Progress Notes (Signed)
Spirometry and Dlco done today. 

## 2022-08-01 NOTE — Progress Notes (Signed)
Very very mild heart musclke stiffness but otherwise normal echo. Will discuss sept 2023 visits

## 2022-08-20 ENCOUNTER — Encounter: Payer: Self-pay | Admitting: Internal Medicine

## 2022-08-20 ENCOUNTER — Ambulatory Visit: Payer: Medicare HMO | Admitting: Internal Medicine

## 2022-08-20 ENCOUNTER — Other Ambulatory Visit: Payer: Self-pay | Admitting: *Deleted

## 2022-08-20 VITALS — BP 132/70 | HR 91 | Temp 97.8°F | Ht 63.0 in | Wt 198.0 lb

## 2022-08-20 DIAGNOSIS — J8489 Other specified interstitial pulmonary diseases: Secondary | ICD-10-CM

## 2022-08-20 DIAGNOSIS — M349 Systemic sclerosis, unspecified: Secondary | ICD-10-CM

## 2022-08-20 DIAGNOSIS — M359 Systemic involvement of connective tissue, unspecified: Secondary | ICD-10-CM

## 2022-08-20 NOTE — Patient Instructions (Addendum)
Interstitial lung disease due to connective tissue disease ([place]) Scleroderma ([place])   - pulmonary fibrosis stable on CT 2013 -> 2022 on CT but  breathing test over years through sept 2021 show potential decline and walk test 06/19/2022 and PFT Aug 2023 is concerning for decline  - Echo fall 2023 is normal  - best to complete restaging  and reasess treatment need  - appointments missed   Plan - - do HRCT supine and prone in next few to several weeks - do ono test on room air   Followup  30 min VIDEO VISIT slot in <= 4 weeks to see Dr Chase Caller but after tests done  - discuss therapies if applicable

## 2022-08-20 NOTE — Progress Notes (Signed)
OV 11/27/2019  Subjective:  Patient ID: [name], female , DOB: [birth year] , age 76 y.o. , MRN: [id number] , ADDRESS: [place] [address]   11/27/2019 -   Chief Complaint  Patient presents with   Follow-up    Former BQ pt. Pt states she does become SOB with activities and also at times when at rest. Pt also has an occ cough which is worse in the morning and will cough up clear phlegm.   Follow-up interstitial lung disease secondary to connective tissue disease [rheumatologist Tacey Heap, M.D., M.H.S. at [facility] but now Dr Richardson Landry at  [facility])  ILD: Dr Joya Gaskins -> Dr Lake Bells -> DR Chase Caller at ILD center [place] ,  HPI [name] 76 y.o. -presents to the ILD clinic for evaluation of her interstitial lung disease secondary to connective tissue disease (rheumatoid factor 90, ANA 1: 640, ANA second titer 1: 2000 560, positive SCL 70 August 2012 at [facility] and positive [place].  I am meeting her for the first time.  Her rheumatology situation is managed by Dr. Richardson Landry at [place].  She no longer follows at [facility] after the doctor they relocated to [place].  She is maintained on 5 mg prednisone per day.  She is not on any other immunomodulators.  Her last TNF alpha blockade was over 3 years ago.  She is known to have interstitial lung disease.  Last staged in March 2019.  She tells me that since then she continues to have significant amount of cough and shortness of breath.  She believes the shortness of breath is slightly worse.  She thinks her pulmonary issues itself is stable.  She thinks the worsening shortness of breath and the persistent shortness of breath could be due to cardiac issues.  In 2015 she had a right heart catheterization evaluation that was normal.  Last echo was in 2015.  No further work-up since then.  Dyspnea symptom score is listed below.  She in addition she also has significant amount  of cough that she attributes to sinus drainage.  She is not had a CT scan of the sinus.  She says she is allergic to a few things but last tested several years ago.  She does not remember when.  She has seen ENT many years ago does not know the details.  At this point in time she would love to have her symptoms better managed.  IMPRESSION: HRCT Lungs/Pleura: Subpleural reticulation and traction bronchiectasis/bronchiolectasis are again seen without a definite zonal predominance. Findings are worst in the right middle lobe. Pattern and distribution appear stable from prior exams. No pleural fluid. Airway is unremarkable. No air trapping. 1. Pulmonary parenchymal pattern of fibrosis is stable from prior exams and likely due to fibrotic nonspecific interstitial pneumonitis. 2. Cirrhosis, steatosis. 3. Aortic atherosclerosis (ICD10-170.0). Coronary artery calcification.     Electronically Signed   By: Lorin Picket M.D.   On: 03/17/2018 09:09  ROS - per HPI    OV 12/26/2019  Subjective:  Patient ID: [name], female , DOB: 10-Apr-1946 , age 19 y.o. , MRN: [id number] , ADDRESS: [address] [place] [address]   12/26/2019 -   Chief Complaint  Patient presents with   Follow-up    Pt states she has been doing well since last visit and denies any complaints.   Follow-up interstitial lung disease secondary to connective tissue disease  [rheumatologist previously - [employer]  Fanny Dance, M.D., M.H.S. at [facility] but now Dr Richardson Landry at  [facility])  - per records - ILD since 2009  - systemic sclerosis/RA overlap per [facility] notes  = Telangiectasias, Raynaud's, esophageal dysmotility - ? calcinosis on right heel. Per [facility] notes  - Serology   - 2012 at [place]: (rheumatoid factor 90, ANA 1: 640, ANA second titer 1: 2000 560, positive SCL 70 August 2012).   -  [facility] RF + (low titer), ANA + (low titer), SCl 70 +  (in [facility])  - ILD: Dr Joya Gaskins -> Dr Lake Bells -> DR Chase Caller at ILD center  [place]   Rx History - all [place] initiated 12/2011 d/c due to lack of control of arthrits D. MTX initiated 04/2012 -discontinued due to concerns since patient has underlying lung disease   D- CHF - 2015 [facility]   - She had a right heart cath in April 2015 which showed the following:  Hemodynamic Findings:   PA: 43/18 (mean 31)   PCWP: 25   Fick Cardiac Index: 3.45 L/min/m2     xxxxxxxxxxxxxxxxxxxxxxxxxxxxxxxxxxxxxxxxxxxxxxxxxxxxxx HPI [name] 76 y.o. -returns for follow-up to discuss her test results.  She has interstitial lung disease secondary to connective tissue disease.  She has not been able to do her pulmonary function test because of the COVID-19 pandemic and the disruptions.  It is scheduled in the next few to several weeks.  She did have repeat serology and the overwhelmingly strong antibodies to scleroderma antibody.  The rheumatoid factor and ANA weakly positive only.  Her high-resolution CT chest scan of the chest according to thoracic radiology that I personally visualized shows stable pattern compared to 2013.  This is somewhat discrepant with the pulmonary function test that is shown decline and the symptoms are showing decline.  Of course the upcoming pulmonary function test is not available.  Nevertheless it is reassuring that the CT scan is stable.  We discussed her symptoms and it appears she does have diastolic dysfunction is based on the echo.  Clinically she is euvolemic.  She is also physically deconditioned and the BMI is high.  Her other big symptom burden is a cough secondary to significant postnasal drainage.  The CT scan of the sinus itself is normal.  She does have some deviated nasal septum.  Her blood IgE is normal. ANCA is normal 0 S1erology Jan 2021  Results for [name], [name] (MRN [id number]) as of 12/26/2019 11:49  Ref. Range 11/27/2019 12:43  Anti Nuclear Antibody (ANA) Latest Ref Range: NEGATIVE  POSITIVE (A)  ANA Pattern 1 Unknown Nuclear,  Speckled (A)  ANA Titer 1 Latest Units: titer 1:74 (H)  Cyclic Citrullin Peptide Ab Latest Units: UNITS <16  Myeloperoxidase Abs Latest Units: AI <1.0  Serine Protease 3 Latest Units: AI <1.0  RA Latex Turbid. Latest Ref Range: <14 IU/mL 17 (H)     IMPRESSIONS - echo jan 2021      1. Left ventricular ejection fraction, by visual estimation, is 60 to 65%. The left ventricle has normal function. There is moderately increased left ventricular hypertrophy.  2. Left ventricular diastolic parameters are consistent with Grade I diastolic dysfunction (impaired relaxation).  3. The left ventricle has no regional wall motion abnormalities.  4. Global right ventricle has normal systolic function.The right ventricular size is normal. No increase in right ventricular wall thickness.  5. Left atrial size was mildly dilated.  6. Right atrial size was normal.  7. The mitral  valve is normal in structure. No evidence of mitral valve regurgitation. No evidence of mitral stenosis.  8. The tricuspid valve is normal in structure.  9. The tricuspid valve is normal in structure. Tricuspid valve regurgitation is not demonstrated. 10. The aortic valve is tricuspid. Aortic valve regurgitation is not visualized. No evidence of aortic valve sclerosis or stenosis. 11. The pulmonic valve was not well visualized. Pulmonic valve regurgitation is not visualized. 12. The inferior vena cava is normal in size with greater than 50% respiratory variability, suggesting right atrial pressure of 3 mmHg.     Results for [name], [name] (MRN [id number]) as of 11/27/2019 12:15  Ref. Range 09/05/2013 15:00 03/07/2014 12:09 02/07/2015 10:34 09/22/2016 15:33 02/25/2018 14:59  FVC-Pre Latest Units: L ? 2.03 2.04 1.97 1.94 1.75  FVC-%Pred-Pre Latest Units: % 68 70 68 68 60   Results for [name], [name] (MRN [id number]) as of 11/27/2019 12:15  Ref. Range 09/05/2013 15:00 03/07/2014 12:09 02/07/2015 10:34 09/22/2016 15:33 02/25/2018 14:59   DLCO unc Latest Units: ml/min/mmHg 14.81 14.71 15.60 15.97 16.44  DLCO unc % pred Latest Units: % 68 68 72 74 71    IMPRESSION: HRCT JAn 2021 1. There is a spectrum of findings in the lungs compatible with interstitial lung disease, which appear stable compared to the prior examination. This is technically characterized as compatible with usual interstitial pneumonia (UIP) per current ATS guidelines. However, given the stability dating back to at least 2013 and the overall appearance, this is again favored to reflect fibrotic phase nonspecific interstitial pneumonia (NSIP). 2. Aortic atherosclerosis, in addition to left main and 3 vessel coronary artery disease. Assessment for potential risk factor modification, dietary therapy or pharmacologic therapy may be warranted, if clinically indicated. 3. Hepatic steatosis.   Aortic Atherosclerosis (ICD10-I70.0).     Electronically Signed   By: Vinnie Langton M.D.   On: 12/19/2019 14:33  CT SINUS JAn 2021   EXAM: CT PARANASAL SINUS LIMITED WITHOUT CONTRAST   TECHNIQUE: Non-contiguous multidetector CT images of the paranasal sinuses were obtained in a single plane without contrast.   COMPARISON:  None.   FINDINGS: The included portions of the paranasal sinuses are clear without evidence of significant mucosal thickening or fluid. Rightward nasal septal deviation measures 3 mm. The regional soft tissues are grossly unremarkable.   IMPRESSION: Clear paranasal sinuses.     Electronically Signed   By: Logan Bores M.D.   On: 12/19/2019 14:15    ECHO JAn 2021  IMPRESSIONS      1. Left ventricular ejection fraction, by visual estimation, is 60 to 65%. The left ventricle has normal function. There is moderately increased left ventricular hypertrophy.  2. Left ventricular diastolic parameters are consistent with Grade I diastolic dysfunction (impaired relaxation).  3. The left ventricle has no regional wall motion  abnormalities.  4. Global right ventricle has normal systolic function.The right ventricular size is normal. No increase in right ventricular wall thickness.  5. Left atrial size was mildly dilated.  6. Right atrial size was normal.  7. The mitral valve is normal in structure. No evidence of mitral valve regurgitation. No evidence of mitral stenosis.  8. The tricuspid valve is normal in structure.  9. The tricuspid valve is normal in structure. Tricuspid valve regurgitation is not demonstrated. 10. The aortic valve is tricuspid. Aortic valve regurgitation is not visualized. No evidence of aortic valve sclerosis or stenosis. 11. The pulmonic valve was not well visualized. Pulmonic valve regurgitation is not visualized. 12.  The inferior vena cava is normal in size with greater than 50% respiratory variability, suggesting right atrial pressure of 3 mmHg.    OV 08/08/2020  Subjective:  Patient ID: [name], female , DOB: 12/31/1945 , age 61 y.o. , MRN: [id number] , ADDRESS: [address] [place] [address]   08/08/2020 -   Chief Complaint  Patient presents with   Follow-up    here to go over pft results    Follow-up interstitial lu HPI [name] 76 y.o. -presents for the above issues.  She is on observation therapy.  Overall she states that her shortness of breath and symptoms are the same although she is quite symptomatic.  She understands that she has multifactorial dyspnea from obesity, diastolic dysfunction, fitness and ILD.  She had a pulmonary function test that is documented below.  Shows continued decline in FVC but the DLCO is stable.  She could not attend pulmonary rehabilitation because sometime in the spring I suppose she fractured her left hand.  She is willing to reattend pulmonary rehabilitation.  We discussed the idea of starting antifibrotic's given the fact she has ILD associated with scleroderma.  But she has baseline mild diarrhea which is from irritable bowel  syndrome.  She says that GI has cleared her and she is no longer at follow-up with them.  But she is afraid to take nintedanib because of the diarrhea.  We discussed the alternative of taking immunosuppression such as CellCept and or Tocilizumab but she is definitely resistant to this idea because of the immunomodulation  In terms of vaccine: She is in need of having the new Shingrix vaccine.  She can benefit from a Covid booster.  She can also benefit from high-dose flu shot.  We discussed all this.      OV 06/19/2022  Subjective:  Patient ID: [name], female , DOB: 1946-08-21 , age 43 y.o. , MRN: [id number] , ADDRESS: [place] [id number] PCP Asencion Noble, MD Patient Care Team: Asencion Noble, MD as PCP - General (Internal Medicine) Stanford Breed Denice Bors, MD as PCP - Cardiology (Cardiology) Elsie Stain, MD as Attending Physician (Pulmonary Disease)  This Provider for this visit: Treatment Team:  Attending Provider: Brand Males, MD    06/19/2022 -   Chief Complaint  Patient presents with   Follow-up    Follow-up SOB, ILD, with normal cough.     HPI [name] 76 y.o. -returns for follow-up.  I personally have not seen this in September 2021.  She states that she did not follow-up other than 1 seeing the nurse practitioner because overall she was doing well.  She felt she did not see need to.  She is aware in the past that we discussed antifibrotic nintedanib because of the UIP pattern that she has on her CT chest.  She seems more interested in therapeutic options at this visit.  She initially said no change in her dyspnea but as I spoke to her she said maybe she is slightly worse.  She says that when she fixes do not takes a shower she has to take 5-10 minutes rest because of shortness of breath and then she exerts.  Overall dyspnea score is stable but dyspnea for stairs is worse.  There is no interim hospitalization or ER visits or medication changes.   She has recently had cataract surgery and has a pending right cataract surgery.  We discussed patient support group.  She is very  interested in connecting with Ms. Butch Penny who is his local support group leader.  Her husband is here with her.  Husband is sleep apnea and has new consult visit July 01, 2022 with Geraldo Pitter.  He had some questions.  I have asked the CMA to record his questions and send it to Geraldo Pitter.     PFT  OV 08/20/2022  Subjective:  Patient ID: [name], female , DOB: [birth year] , age 89 y.o. , MRN: [id number] , ADDRESS: [place] [id number] PCP Asencion Noble, MD Patient Care Team: Asencion Noble, MD as PCP - General (Internal Medicine) Stanford Breed Denice Bors, MD as PCP - Cardiology (Cardiology) Elsie Stain, MD as Attending Physician (Pulmonary Disease)  This Provider for this visit: Treatment Team:  Attending Provider: Brand Males, MD    08/20/2022 -   Chief Complaint  Patient presents with   Follow-up    Pt states she is about the same since last visit and denies any complaints.    ILD secondary to connective tissue disease  [rheumatologist previously - Beatriz Stallion, M.D., M.H.S. at [facility] but now Dr Richardson Landry at  [facility])  - per records - ILD since 2009  - systemic sclerosis/RA overlap per [facility] notes  = Telangiectasias, Raynaud's, esophageal dysmotility - ? calcinosis on right heel. Per [facility] notes  - Serology   - 2012 at [place]: (rheumatoid factor 90, ANA 1: 640, ANA second titer 1: 2000 560, positive SCL 70 August 2012).   -  [facility] RF + (low titer), ANA + (low titer), SCl 70 +  (in [facility])  -High-resolution CT chest January 2021 with UIP features [stable since 2013]  - ILD: Dr Joya Gaskins -> Dr Lake Bells -> DR Chase Caller at ILD center [place] initiated 12/2011 d/c due to lack of control of arthrits . MTX initiated 04/2012 -discontinued due to concerns since patient has underlying lung  disease Plaquenil and prednisone as of July 2023   D- CHF -   [facility]HC  -  - She had a right heart cath in April 2015 which showed the following: Hemodynamic Findings:   PA: 43/18 (mean 31)   PCWP: 25   Fick Cardiac Index: 3.45 L/min/m2    -Grade 1 diastolic dysfunction on echocardiogram January 2021  HPI [name] 76 y.o. -returns for follow-up.  This visit is tod iscuss test results.  She did have echocardiogram that shows grade 1 diastolic dysfunction which is to be expected given her age and obesity.  She had pulmonary function test that shows a decline much as suspected both in the FVC and DLCO compared to 2 years ago.  She was supposed to have overnight pulse oximetry but according to the CMA an order was done but the test was not done.  She also did not show up for a high-resolution CT scan of the chest at [facility].  In discussions with her it appears that she believes that her husband took the phone call.  Husband has early onset dementia and might of forgotten.  The same is now put orders to get the stool test done with patient cell phone being the primary contact.  Patient will now reschedule the visit.  She will need to get CT scan of the chest done in 010 done before we can make further progress.  Nevertheless she appears to have progressive phenotype     SYMPTOM  SCALE - ILD 12/26/2019  08/08/2020 198# 06/19/2022 197# 08/20/2022   O2 use ra ra ra   Shortness of Breath 0 -> 5 scale with 5 being worst (score 6 If unable to do)     At rest 0 0 0 2  Simple tasks - showers, clothes change, eating, shaving _0 Household (dishes, doing bed, laundry) _1 Walking level at own pace 3 2 1.5 2  Walking up Stairs _2 Total (30-36) Dyspnea Score 14 12 8.5 16  How bad is your cough? 3 3 At times   How bad is your fatigue _3 How bad is nausea 3 2 0 0  How bad is vomiting?  0 0 0 0  How bad is diarrhea? 3 0 1 0  How bad is anxiety? _4 How bad is depression _5 Simple office walk 185 feet x  3 laps goal with forehead probe 08/08/2020  06/19/2022  08/20/2022   O2 used ra ra ra  Number laps completed _6 Comments about pace Mild steady pace Slow pace r  Resting Pulse Ox/HR 96% and 79/min 100% and 90 99% and HR 91  Final Pulse Ox/HR 94% and 101/min 94% and HR 108 92% and HR 104  Desaturated </= 88% no no   Desaturated <= 3% points no Yes, 6 Yes 7 points  Got Tachycardic >/= 90/min yes yes yes  Symptoms at end of test none Some dyspnea mild  Miscellaneous comments x       PFT     Latest Ref Rng & Units 07/27/2022    4:11 PM 07/23/2020   11:58 AM 02/25/2018    2:59 PM 09/22/2016    3:33 PM 02/07/2015   10:34 AM 03/07/2014   12:09 PM 09/05/2013    3:00 PM  PFT Results  FVC-Pre L 1.51  1.70  1.75  1.94  1.97  2.04    FVC-Predicted Pre % 55  60  60  68  68  70  68      FVC-Post L   1.78  1.92  1.94   2.03      FVC-Predicted Post %   61  68  67   69      Pre FEV1/FVC % % 76  73  77  74  76  75  79      Post FEV1/FCV % %   78  79  80   82      FEV1-Pre L 1.15  1.25  1.35  1.44  1.50  1.54  1.57      FEV1-Predicted Pre % 56  59  62  67  68  69  70      FEV1-Post L   1.38  1.51  1.56   1.66      DLCO uncorrected ml/min/mmHg 14.37  16.83  16.44  15.97  15.60  14.71  14.81      DLCO UNC% % 77  90  71  74  72  68  68      DLCO corrected ml/min/mmHg 14.37  16.83   15.87      DLCO COR %Predicted % 77  90   73      DLVA Predicted % 122  142  122  114  115  111  107      TLC L    3.40  2.92   3.00      TLC % Predicted %    71  61   63      RV % Predicted %    72  50   54         This result is from an external source.    ECHO [id number]   IMPRESSIONS     1. Left ventricular ejection fraction, by estimation, is 60 to 65%. The  left ventricle has normal function. The left ventricle has no regional  wall motion abnormalities. Left ventricular diastolic parameters are  consistent with Grade I diastolic   dysfunction (impaired relaxation).   2. Right ventricular systolic function is normal. The right ventricular  size is normal. There is normal pulmonary artery systolic pressure.   3. Left atrial size was mildly dilated.   4. The mitral valve is abnormal. No evidence of mitral valve  regurgitation. No evidence of mitral stenosis.   5. The aortic valve is tricuspid. Aortic valve regurgitation is not  visualized. No aortic stenosis is present.   6. The inferior vena cava is normal in size with greater than 50%  respiratory variability, suggesting right atrial pressure of 3 mmHg.    has a past medical history of Diastolic congestive heart failure ([place]), GERD (gastroesophageal reflux disease), History of blood transfusion, cholecystectomy, Interstitial lung disease ([place]), Osteoarthritis, Pulmonary fibrosis ([place]), Raynaud disease, Rheumatoid arthritis(714.0), Scleroderma ([place]), Seasonal allergies, Shortness of breath dyspnea, and Systemic sclerosis ([place]).   reports that she quit smoking about 33 years ago. Her smoking use included cigarettes. She has a 20.00 pack-year smoking history. She has never used smokeless tobacco.  Past Surgical History:  Procedure Laterality Date   ABDOMINAL HYSTERECTOMY     BLADDER SUSPENSION     CARPAL TUNNEL RELEASE     LAPAROSCOPIC CHOLECYSTECTOMY     LEFT HEART CATHETERIZATION WITH CORONARY/GRAFT ANGIOGRAM  03/15/2014   Procedure: LEFT HEART CATHETERIZATION WITH Beatrix Fetters;  Surgeon: Burnell Blanks, MD;  Location: [facility] C[facility] LAB;  Service: Cardiovascular;;   TONSILLECTOMY     TOTAL KNEE ARTHROPLASTY Left 02/11/2015   Procedure: LEFT TOTAL KNEE ARTHROPLASTY, RIGHT KNEE CORTISONE INJECTION;  Surgeon: Gaynelle Arabian, MD;  Location: [facility] ORS;  Service: Orthopedics;  Laterality: Left;   TOTAL KNEE ARTHROPLASTY Right 10/05/2016   Procedure: RIGHT TOTAL KNEE ARTHROPLASTY;  Surgeon: Gaynelle Arabian, MD;  Location: [facility] ORS;  Service: Orthopedics;  Laterality:  Right;   VESICOVAGINAL FISTULA CLOSURE W/ TAH  1987    Allergies  Allergen Reactions   Codeine     Neurological problems - hallucinations, "spacey"    Immunization History  Administered Date(s) Administered   Influenza Split 08/01/2011, 09/02/2012, 08/30/2013, 08/29/2016   Influenza, High Dose Seasonal PF 08/28/2017   Influenza,inj,Quad PF,6+ Mos 09/03/2014   Influenza-Unspecified 08/14/2014, 07/20/2019, 08/15/2019   Moderna Sars-Covid-2 Vaccination 12/21/2018, 01/24/2020   Pneumococcal Conjugate-13 09/19/2014, 11/28/2014   Pneumococcal Polysaccharide-23 11/30/2008, 08/30/2009   Zoster, Live 11/30/2008    Family History  Problem Relation Age of Onset   Stroke Mother    Heart disease Mother        CABG at age 69   Heart attack Father        Died of MI at age 93   Emphysema Father    Dementia Brother    Osteoarthritis Other    Celiac disease Other      Current Outpatient Medications:  acetaminophen (TYLENOL) 325 MG tablet, Take 2 tablets (650 mg total) by mouth every 6 (six) hours as needed for mild pain (or Fever >/= 101)., Disp: 40 tablet, Rfl: 0   albuterol (VENTOLIN HFA) 108 (90 Base) MCG/ACT inhaler, INHALE 2 PUFFS INTO THE LUNGS EVERY 6 (SIX) HOURS AS NEEDED FOR WHEEZING., Disp: 1 each, Rfl: 1   amLODipine (NORVASC) 2.5 MG tablet, 1 tablet, Disp: , Rfl:    aspirin 81 MG chewable tablet, Chew 81 mg by mouth daily. Start on 10/26/16 after completion of Xarelto, Disp: , Rfl:    benzonatate (TESSALON) 100 MG capsule, Take 200 mg by mouth 3 (three) times daily as needed., Disp: , Rfl:    fexofenadine-pseudoephedrine (ALLEGRA-D) 60-120 MG 12 hr tablet, Take 1 tablet by mouth daily., Disp: , Rfl:    furosemide (LASIX) 40 MG tablet, TAKE 2 TABLETS (80 MG TOTAL) BY MOUTH DAILY., Disp: 180 tablet, Rfl: 3   hydroxychloroquine (PLAQUENIL) 200 MG tablet, Take 400 mg by mouth daily., Disp: , Rfl:    hyoscyamine (LEVSIN) 0.125 MG tablet, Take by mouth 3 (three) times daily as  needed., Disp: , Rfl:    ipratropium (ATROVENT) 0.06 % nasal spray, SMARTSIG:2 Spray(s) Both Nares Twice Daily PRN, Disp: , Rfl:    ipratropium-albuterol (DUONEB) 0.5-2.5 (3) MG/3ML SOLN, , Disp: , Rfl:    pantoprazole (PROTONIX) 40 MG tablet, Take 40 mg by mouth 2 (two) times daily., Disp: , Rfl:    predniSONE (DELTASONE) 5 MG tablet, Take 5 mg by mouth daily., Disp: , Rfl:    sertraline (ZOLOFT) 50 MG tablet, , Disp: , Rfl:    triamcinolone (NASACORT) 55 MCG/ACT AERO nasal inhaler, Place 2 sprays into the nose daily., Disp: , Rfl:    zolpidem (AMBIEN) 10 MG tablet, Take 5-10 mg by mouth at bedtime as needed for sleep. , Disp: , Rfl:    rosuvastatin (CRESTOR) 40 MG tablet, Take 1 tablet (40 mg total) by mouth daily., Disp: 90 tablet, Rfl: 3      Objective:   Vitals:   08/20/22 1428  BP: 132/70  Pulse: 91  Temp: 97.8 F (36.6 C)  TempSrc: Oral  SpO2: 99%  Weight: 198 lb (89.8 kg)  Height: 5' 3" (1.6 m)    Estimated body mass index is 35.07 kg/m as calculated from the following:   Height as of this encounter: 5' 3" (1.6 m).   Weight as of this encounter: 198 lb (89.8 kg).  _0 @  Filed Weights   08/20/22 1428  Weight: 198 lb (89.8 kg)     Physical Exam   General: No distress. Looks well Neuro: Alert and Oriented x 3. GCS 15. Speech normal Psych: Pleasant Resp:  Barrel Chest - no.  Wheeze - no, Crackles - mayb, No overt respiratory distress CVS: Normal heart sounds. Murmurs - no Ext: Stigmata of Connective Tissue Disease - no HEENT: Normal upper airway. PEERL +. No post nasal drip        Assessment:       ICD-10-CM   1. Interstitial lung disease due to connective tissue disease ([place])  J84.89    M35.9     2. Systemic sclerosis ([place])  M34.9          Plan:     Patient Instructions  Interstitial lung disease due to connective tissue disease ([place]) Scleroderma ([place])   - pulmonary fibrosis stable on CT 2013 -> 2022 on CT but  breathing test over  years through sept 2021 show potential  decline and walk test 06/19/2022 and PFT Aug 2023 is concerning for decline  - Echo fall 2023 is normal  - best to complete restaging  and reasess treatment need  - appointments missed   Plan - - do HRCT supine and prone in next few to several weeks - do ono test on room air   Followup  30 min VIDEO VISIT slot in <= 4 weeks to see Dr Chase Caller but after tests done  - discuss therapies if applicable    SIGNATURE    Dr. Brand Males, M.D., F.C.C.P,  Pulmonary and Critical Care Medicine Staff Physician, [place] Director - Interstitial Lung Disease  Program  [facility] [place] [facility] [place], [place], [address]  Pager: [phone], If no answer or between  15:00h - 7:00h: call [phone] Telephone: [phone]  3:01 PM 08/20/2022

## 2022-08-27 DIAGNOSIS — R0683 Snoring: Secondary | ICD-10-CM | POA: Diagnosis not present

## 2022-08-27 DIAGNOSIS — G4730 Sleep apnea, unspecified: Secondary | ICD-10-CM | POA: Diagnosis not present

## 2022-08-31 ENCOUNTER — Ambulatory Visit (HOSPITAL_COMMUNITY)
Admission: RE | Admit: 2022-08-31 | Discharge: 2022-08-31 | Disposition: A | Discharge status: Home / Self Care | Payer: Medicare HMO | Source: Ambulatory Visit | Attending: Internal Medicine | Admitting: Internal Medicine

## 2022-08-31 DIAGNOSIS — J849 Interstitial pulmonary disease, unspecified: Secondary | ICD-10-CM | POA: Diagnosis not present

## 2022-08-31 DIAGNOSIS — Z1272 Encounter for screening for malignant neoplasm of vagina: Secondary | ICD-10-CM | POA: Diagnosis not present

## 2022-08-31 DIAGNOSIS — M359 Systemic involvement of connective tissue, unspecified: Secondary | ICD-10-CM | POA: Insufficient documentation

## 2022-08-31 DIAGNOSIS — J8489 Other specified interstitial pulmonary diseases: Secondary | ICD-10-CM | POA: Diagnosis not present

## 2022-08-31 DIAGNOSIS — J479 Bronchiectasis, uncomplicated: Secondary | ICD-10-CM | POA: Diagnosis not present

## 2022-08-31 DIAGNOSIS — Z6835 Body mass index (BMI) 35.0-35.9, adult: Secondary | ICD-10-CM | POA: Diagnosis not present

## 2022-08-31 DIAGNOSIS — K449 Diaphragmatic hernia without obstruction or gangrene: Secondary | ICD-10-CM | POA: Diagnosis not present

## 2022-08-31 DIAGNOSIS — Z1231 Encounter for screening mammogram for malignant neoplasm of breast: Secondary | ICD-10-CM | POA: Diagnosis not present

## 2022-08-31 DIAGNOSIS — I700 Atherosclerosis of aorta: Secondary | ICD-10-CM | POA: Diagnosis not present

## 2022-08-31 DIAGNOSIS — Z124 Encounter for screening for malignant neoplasm of cervix: Secondary | ICD-10-CM | POA: Diagnosis not present

## 2022-09-01 ENCOUNTER — Telehealth: Payer: Self-pay | Admitting: Internal Medicine

## 2022-09-01 NOTE — Telephone Encounter (Signed)
Overnight pulse oximetry on 08/27/2022 shows pulse ox less than equal to 88% for 5 minutes and 33 seconds  Plan - This very mild desaturation.  If she is interested we can do 1 L nasal cannula at night as a time-limited trial for 3 months and see if this helps her symptoms.

## 2022-09-02 ENCOUNTER — Other Ambulatory Visit: Payer: Self-pay | Admitting: Obstetrics and Gynecology

## 2022-09-02 DIAGNOSIS — R928 Other abnormal and inconclusive findings on diagnostic imaging of breast: Secondary | ICD-10-CM

## 2022-09-02 NOTE — Telephone Encounter (Signed)
Called and spoke with pt letting her know the results of the ONO and she verbalized understanding stating that she wanted to hold off on O2. Nothing further needed.

## 2022-09-14 ENCOUNTER — Other Ambulatory Visit: Payer: Self-pay | Admitting: Obstetrics and Gynecology

## 2022-09-14 ENCOUNTER — Ambulatory Visit
Admission: RE | Admit: 2022-09-14 | Discharge: 2022-09-14 | Disposition: A | Discharge status: Home / Self Care | Payer: Medicare HMO | Source: Ambulatory Visit | Attending: Obstetrics and Gynecology | Admitting: Obstetrics and Gynecology

## 2022-09-14 DIAGNOSIS — R928 Other abnormal and inconclusive findings on diagnostic imaging of breast: Secondary | ICD-10-CM

## 2022-09-14 DIAGNOSIS — N6321 Unspecified lump in the left breast, upper outer quadrant: Secondary | ICD-10-CM | POA: Diagnosis not present

## 2022-09-14 DIAGNOSIS — N6320 Unspecified lump in the left breast, unspecified quadrant: Secondary | ICD-10-CM

## 2022-09-16 ENCOUNTER — Ambulatory Visit
Admission: RE | Admit: 2022-09-16 | Discharge: 2022-09-16 | Disposition: A | Discharge status: Home / Self Care | Payer: Medicare HMO | Source: Ambulatory Visit | Attending: Family Medicine | Admitting: Family Medicine

## 2022-09-16 VITALS — BP 130/77 | HR 91 | Temp 98.1°F | Resp 18

## 2022-09-16 DIAGNOSIS — H6993 Unspecified Eustachian tube disorder, bilateral: Secondary | ICD-10-CM | POA: Diagnosis not present

## 2022-09-16 MED ORDER — PREDNISONE 20 MG PO TABS: 40.0000 mg | ORAL_TABLET | Freq: Every day | ORAL | 0 refills | Status: DC

## 2022-09-16 MED ORDER — CETIRIZINE HCL 5 MG PO TABS: 5.0000 mg | ORAL_TABLET | Freq: Every day | ORAL | 2 refills | Status: DC

## 2022-09-16 NOTE — ED Triage Notes (Signed)
Pt reports dull pain and pressure in right ear, radiates to jaw. Pt finished doxycycline yesterday for ear infection.

## 2022-09-16 NOTE — Discharge Instructions (Signed)
In addition to twice daily on your nasal sprays, you may take Zyrtec I have prescribed daily for allergy control and Coricidin HBP as a decongestant that is safe for your heart.  I have sent over a short course of prednisone in case none of this is successful in relieving your ear pain.

## 2022-09-16 NOTE — ED Provider Notes (Signed)
[facility]    CSN: [id number] Arrival date & time: 09/16/22  1637      History   Chief Complaint Chief Complaint  Patient presents with   Ear Fullness    ear pain - Entered by patient   Appointment    1700    HPI [name] is a 76 y.o. female.   Patient presenting today complaining of almost 2 weeks of bilateral ear pain, pressure, popping.  Denies fever, chills, ear drainage, loss of hearing, headaches.  Does have a history of seasonal allergies and eustachian tube dysfunction, states this happens to her every year so she called her PCP about it and he prescribed doxycycline last week.  She just completed this course and is not having resolution of symptoms.  She does take Flonase and Astelin nasal sprays for seasonal allergy control.    Past Medical History:  Diagnosis Date   Diastolic congestive heart failure ([place])    02/05/15  states no fluid in extremities, breathing same as has been   GERD (gastroesophageal reflux disease)    History of blood transfusion    Hx of cholecystectomy    Interstitial lung disease (H[facility])    Osteoarthritis    Pulmonary fibrosis (H[facility])    Raynaud disease    Rheumatoid arthritis(714.0)    Scleroderma (HCC)    Seasonal allergies    Shortness of breath dyspnea    states normal for her   Systemic sclerosis [facility])     Patient Active Problem List   Diagnosis Date Noted   Healthcare maintenance 12/12/2020   Interstitial lung disease due to connective tissue disease ([place]) 12/12/2020   Fracture of metacarpal bone 07/07/2019   DDD (degenerative disc disease), cervical 11/29/2018   Pain of left hip joint 05/06/2018   Ischemia of finger 03/01/2018   Palpitations 02/11/2016   Foot infection 04/30/2015   OA (osteoarthritis) of knee 02/11/2015   Preop cardiovascular exam 01/25/2015   Osteopenia 09/03/2014   Dyspnea 08/14/2014   Chronic diastolic congestive heart failure ([place]) 08/14/2014   Pulmonary hypertension ([place])  04/25/2014   Chest pain 03/13/2014   Systemic sclerosis (HCC)    Osteoarthritis    Seasonal allergies    Chronic rheumatic arthritis (HCC)    Raynaud disease    Hypertension    Pulmonary fibrosis (HCC)    Arthritis of knee, degenerative 09/30/2011   Lung disease with systemic sclerosis ([place]) 09/30/2011   Osteoarthritis of both knees 09/30/2011    Past Surgical History:  Procedure Laterality Date   ABDOMINAL HYSTERECTOMY     BLADDER SUSPENSION     CARPAL TUNNEL RELEASE     LAPAROSCOPIC CHOLECYSTECTOMY     LEFT HEART CATHETERIZATION WITH CORONARY/GRAFT ANGIOGRAM  03/15/2014   Procedure: LEFT HEART CATHETERIZATION WITH Beatrix Fetters;  Surgeon: Burnell Blanks, MD;  Location: [facility] CATH LAB;  Service: Cardiovascular;;   TONSILLECTOMY     TOTAL KNEE ARTHROPLASTY Left 02/11/2015   Procedure: LEFT TOTAL KNEE ARTHROPLASTY, RIGHT KNEE CORTISONE INJECTION;  Surgeon: Gaynelle Arabian, MD;  Location: [facility] ORS;  Service: Orthopedics;  Laterality: Left;   TOTAL KNEE ARTHROPLASTY Right 10/05/2016   Procedure: RIGHT TOTAL KNEE ARTHROPLASTY;  Surgeon: Gaynelle Arabian, MD;  Location: [facility];  Service: Orthopedics;  Laterality: Right;   VESICOVAGINAL FISTULA CLOSURE W/ TAH  1987    OB History   No obstetric history on file.      Home Medications    Prior to Admission medications   Medication Sig Start Date End  Date Taking? Authorizing Provider  cetirizine (ZYRTEC) 5 MG tablet Take 1 tablet (5 mg total) by mouth daily. 09/16/22  Yes Volney American, PA-C  predniSONE (DELTASONE) 20 MG tablet Take 2 tablets (40 mg total) by mouth daily with breakfast. 09/16/22  Yes Volney American, PA-C  acetaminophen (TYLENOL) 325 MG tablet Take 2 tablets (650 mg total) by mouth every 6 (six) hours as needed for mild pain (or Fever >/= 101). 02/13/15   Perkins, Alexzandrew L, PA-C  albuterol (VENTOLIN HFA) 108 (90 Base) MCG/ACT inhaler INHALE 2 PUFFS INTO THE LUNGS EVERY 6 (SIX) HOURS AS  NEEDED FOR WHEEZING. 03/21/21   Brand Males, MD  amLODipine (NORVASC) 2.5 MG tablet 1 tablet 11/03/18   [provider]  aspirin 81 MG chewable tablet Chew 81 mg by mouth daily. Start on 10/26/16 after completion of Xarelto    [provider]  benzonatate (TESSALON) 100 MG capsule Take 200 mg by mouth 3 (three) times daily as needed. 01/08/22   [provider]  fexofenadine-pseudoephedrine (ALLEGRA-D) 60-120 MG 12 hr tablet Take 1 tablet by mouth daily.    [provider]  furosemide (LASIX) 40 MG tablet TAKE 2 TABLETS (80 MG TOTAL) BY MOUTH DAILY. 05/28/21   Lelon Perla, MD  hydroxychloroquine (PLAQUENIL) 200 MG tablet Take 400 mg by mouth daily.    [provider]  hyoscyamine (LEVSIN) 0.125 MG tablet Take by mouth 3 (three) times daily as needed. 01/09/21   [provider]  ipratropium (ATROVENT) 0.06 % nasal spray SMARTSIG:2 Spray(s) Both Nares Twice Daily PRN 02/10/21   [provider]  ipratropium-albuterol (DUONEB) 0.5-2.5 (3) MG/3ML SOLN     [provider]  pantoprazole (PROTONIX) 40 MG tablet Take 40 mg by mouth 2 (two) times daily.    [provider]  predniSONE (DELTASONE) 5 MG tablet Take 5 mg by mouth daily. 03/31/22   [provider]  rosuvastatin (CRESTOR) 40 MG tablet Take 1 tablet (40 mg total) by mouth daily. 02/03/21 05/04/21  Lelon Perla, MD  sertraline (ZOLOFT) 50 MG tablet     [provider]  triamcinolone (NASACORT) 55 MCG/ACT AERO nasal inhaler Place 2 sprays into the nose daily.    [provider]  zolpidem (AMBIEN) 10 MG tablet Take 5-10 mg by mouth at bedtime as needed for sleep.     [provider]    Family History Family History  Problem Relation Age of Onset   Stroke Mother    Heart disease Mother        CABG at age 34   Heart attack Father        Died of MI at age 74   Emphysema Father    Dementia Brother    Osteoarthritis Other     Celiac disease Other     Social History Social History   Tobacco Use   Smoking status: Former    Packs/day: 1.00    Years: 20.00    Total pack years: 20.00    Types: Cigarettes    Quit date: 11/30/1988    Years since quitting: 33.8   Smokeless tobacco: Never  Substance Use Topics   Alcohol use: No   Drug use: No     Allergies   Codeine   Review of Systems Review of Systems Per HPI  Physical Exam Triage Vital Signs ED Triage Vitals  Enc Vitals Group     BP 09/16/22 1739 130/77     Pulse Rate 09/16/22  1739 91     Resp 09/16/22 1739 18     Temp 09/16/22 1739 98.1 F (36.7 C)     Temp Source 09/16/22 1739 Oral     SpO2 09/16/22 1739 94 %     Weight --      Height --      Head Circumference --      Peak Flow --      Pain Score 09/16/22 1741 7     Pain Loc --      Pain Edu? --      Excl. in [place]? --    No data found.  Updated Vital Signs BP 130/77 (BP Location: Right Arm)   Pulse 91   Temp 98.1 F (36.7 C) (Oral)   Resp 18   SpO2 94%   Visual Acuity Right Eye Distance:   Left Eye Distance:   Bilateral Distance:    Right Eye Near:   Left Eye Near:    Bilateral Near:     Physical Exam Vitals and nursing note reviewed.  Constitutional:      Appearance: Normal appearance. She is not ill-appearing.  HENT:     Head: Atraumatic.     Ears:     Comments: Bilateral middle ear effusion    Nose: Nose normal.     Mouth/Throat:     Mouth: Mucous membranes are moist.     Pharynx: Oropharynx is clear.  Eyes:     Extraocular Movements: Extraocular movements intact.     Conjunctiva/sclera: Conjunctivae normal.  Cardiovascular:     Rate and Rhythm: Normal rate and regular rhythm.     Heart sounds: Normal heart sounds.  Pulmonary:     Effort: Pulmonary effort is normal.     Breath sounds: Normal breath sounds.  Musculoskeletal:        General: Normal range of motion.     Cervical back: Normal range of motion and neck supple.  Skin:    General: Skin is  warm and dry.  Neurological:     Mental Status: She is alert and oriented to person, place, and time.  Psychiatric:        Mood and Affect: Mood normal.        Thought Content: Thought content normal.        Judgment: Judgment normal.      UC Treatments / Results  Labs (all labs ordered are listed, but only abnormal results are displayed) Labs Reviewed - No data to display  EKG   Radiology No results found.  Procedures Procedures (including critical care time)  Medications Ordered in UC Medications - No data to display  Initial Impression / Assessment and Plan / UC Course  I have reviewed the triage vital signs and the nursing notes.  Pertinent labs & imaging results that were available during my care of the patient were reviewed by me and considered in my medical decision making (see chart for details).     We will add a daily antihistamine for better allergy control in addition to the nasal sprays, discussed Coricidin HBP as needed for decongestant.  Short burst of prednisone sent in case no resolution of symptoms in the next few days.  Final Clinical Impressions(s) / UC Diagnoses   Final diagnoses:  Eustachian tube dysfunction, bilateral     Discharge Instructions      In addition to twice daily on your nasal sprays, you may take Zyrtec I have prescribed daily for allergy control and Coricidin HBP  as a decongestant that is safe for your heart.  I have sent over a short course of prednisone in case none of this is successful in relieving your ear pain.    ED Prescriptions     Medication Sig Dispense Auth. Provider   predniSONE (DELTASONE) 20 MG tablet Take 2 tablets (40 mg total) by mouth daily with breakfast. 6 tablet Volney American, PA-C   cetirizine (ZYRTEC) 5 MG tablet Take 1 tablet (5 mg total) by mouth daily. 30 tablet Volney American, [place]      PDMP not reviewed this encounter.   Volney American, [place] 09/16/22 1821

## 2022-09-18 ENCOUNTER — Ambulatory Visit
Admission: RE | Admit: 2022-09-18 | Discharge: 2022-09-18 | Disposition: A | Discharge status: Home / Self Care | Payer: Medicare HMO | Source: Ambulatory Visit | Attending: Obstetrics and Gynecology | Admitting: Obstetrics and Gynecology

## 2022-09-18 DIAGNOSIS — N6320 Unspecified lump in the left breast, unspecified quadrant: Secondary | ICD-10-CM

## 2022-09-18 DIAGNOSIS — N6321 Unspecified lump in the left breast, upper outer quadrant: Secondary | ICD-10-CM | POA: Diagnosis not present

## 2022-09-18 DIAGNOSIS — Z170 Estrogen receptor positive status [ER+]: Secondary | ICD-10-CM | POA: Diagnosis not present

## 2022-09-18 DIAGNOSIS — C50412 Malignant neoplasm of upper-outer quadrant of left female breast: Secondary | ICD-10-CM | POA: Diagnosis not present

## 2022-09-22 ENCOUNTER — Telehealth: Payer: Self-pay | Admitting: Hematology and Oncology

## 2022-09-22 NOTE — Telephone Encounter (Signed)
Spoke to patient to confirm upcoming afternoon [facility] clinic appointment on 11/1 paperwork will be sent via mail.   Gave location and time, also informed patient that the surgeon's office would be calling as well to get information from them similar to the packet that they will be receiving so make sure to do both.  Reminded patient that all providers will be coming to the clinic to see them HERE and if they had any questions to not hesitate to reach back out to myself or their navigators.

## 2022-09-28 ENCOUNTER — Other Ambulatory Visit: Payer: Self-pay | Admitting: *Deleted

## 2022-09-28 ENCOUNTER — Ambulatory Visit: Payer: Medicare HMO | Admitting: Primary Care

## 2022-09-28 ENCOUNTER — Telehealth: Payer: Self-pay | Admitting: Primary Care

## 2022-09-28 ENCOUNTER — Encounter: Payer: Self-pay | Admitting: *Deleted

## 2022-09-28 ENCOUNTER — Encounter: Payer: Self-pay | Admitting: Primary Care

## 2022-09-28 VITALS — BP 132/66 | HR 88 | Temp 98.3°F | Ht 63.0 in | Wt 197.8 lb

## 2022-09-28 DIAGNOSIS — C50919 Malignant neoplasm of unspecified site of unspecified female breast: Secondary | ICD-10-CM

## 2022-09-28 DIAGNOSIS — J8410 Pulmonary fibrosis, unspecified: Secondary | ICD-10-CM | POA: Diagnosis not present

## 2022-09-28 DIAGNOSIS — Z170 Estrogen receptor positive status [ER+]: Secondary | ICD-10-CM | POA: Insufficient documentation

## 2022-09-28 NOTE — Patient Instructions (Signed)
CT chest showed stable moderate pulmonary fibrosis, no progression compared to previous   Overnight showed very minimal oxygen desaturation, you spent approximately 5 minutes 33 seconds with O2 level less than or equal to 88%.  Respect her decision to hold off on nocturnal oxygen, let us know if you notice O2 dropping at night  Continue Plaquenil and 5 mg of prednisone daily for RA  Follow-up 3 months with Dr. Chase Caller or sooner if needed

## 2022-09-28 NOTE — Telephone Encounter (Signed)
Patient has UIP on imaging, follows with Dr. Chase Caller for ILD- RA. May be candidate for research- can you screen

## 2022-09-28 NOTE — Progress Notes (Signed)
_0  ID: [name], female    DOB: Oct 12, 1946, 76 y.o.   MRN: [id number]  Chief Complaint  Patient presents with   Follow-up    Referring provider: Asencion Noble, MD  HPI: Former smoker quit 1990.  Past medical history significant for interstitial lung disease due to connective tissue disease, pulmonary fibrosis, pulmonary hypertension, Raynaud's disease, chronic diastolic heart failure.  09/28/2022 Patient presents today for follow-up visit.  Patient was scheduled for a follow-u visit on 11/30 with Dr. Chase Caller but came into the office today looking to go over recent CT imaging results.  Patient had overnight oximetry test on 08/27/2022 that showed very mild oxygen desaturation, pulse ox <88% for 5 minutes and 33 seconds. CT chest in October 2023 showed unchanged appearance of lungs, findings considered diagnostic for UIP.  However given stability compared with studies dating back to 2013 alternative diagnoses such as NSIP is of consideration. She is on Plaquenil and prednisone 21m daily for RA, symptoms are well control. She would like to hold off on starting nocturnal oxygen. Recent dx breast cancer, planning for lumpectomy.     SYMPTOM SCALE - ILD 12/26/2019   08/08/2020 198# 06/19/2022 197# 08/20/2022   09/28/2022   O2 use ra ra ra   RA  Shortness of Breath 0 -> 5 scale with 5 being worst (score 6 If unable to do)         At rest 0 0 0 2 0  Simple tasks - showers, clothes change, eating, shaving _1 Household (dishes, doing bed, laundry) _2 Shopping  _3 Walking level at own pace 3 2 1.5 2 0  Walking up Stairs _4 Total (30-36) Dyspnea Score 14 12 8._5 How bad is your cough? 3 3 At times   2  How bad is your fatigue _6 How bad is nausea 3 2 0 0 0  How bad is vomiting?   0 0 0 0 0  How bad is diarrhea? 3 0 1 0 0  How bad is anxiety? _7 4- recent breast cancer dx  How bad is depression _8 Allergies   Allergen Reactions   Codeine     Neurological problems - hallucinations, "spacey"    Immunization History  Administered Date(s) Administered   Influenza Split 08/01/2011, 09/02/2012, 08/30/2013, 08/29/2016   Influenza, High Dose Seasonal PF 08/28/2017   Influenza,inj,Quad PF,6+ Mos 09/03/2014   Influenza-Unspecified 08/14/2014, 07/20/2019, 08/15/2019   Moderna Sars-Covid-2 Vaccination 12/21/2018, 01/24/2020   Pneumococcal Conjugate-13 09/19/2014, 11/28/2014   Pneumococcal Polysaccharide-23 11/30/2008, 08/30/2009   Zoster, Live 11/30/2008    Past Medical History:  Diagnosis Date   Diastolic congestive heart failure ([place]    02/05/15  states no fluid in extremities, breathing same as has been   GERD (gastroesophageal reflux disease)    History of blood transfusion    Hx of cholecystectomy    Interstitial lung disease (HCC)    Osteoarthritis    Pulmonary fibrosis (HCC)    Raynaud disease    Rheumatoid arthritis(714.0)    Scleroderma (HCC)    Seasonal allergies    Shortness of breath dyspnea    states normal for her   [place]ic [place] ([place]     Tobacco History: Social History   Tobacco Use  Smoking Status Former  Packs/day: 1.00   Years: 20.00   Total pack years: 20.00   Types: Cigarettes   Quit date: 11/30/1988   Years since quitting: 33.8  Smokeless Tobacco Never   Counseling given: Not Answered   Outpatient Medications Prior to Visit  Medication Sig Dispense Refill   acetaminophen (TYLENOL) 325 MG tablet Take 2 tablets (650 mg total) by mouth every 6 (six) hours as needed for mild pain (or Fever >/= 101). 40 tablet 0   albuterol (VENTOLIN HFA) 108 (90 Base) MCG/ACT inhaler INHALE 2 PUFFS INTO THE LUNGS EVERY 6 (SIX) HOURS AS NEEDED FOR WHEEZING. 1 each 1   amLODipine (NORVASC) 2.5 MG tablet 1 tablet     aspirin 81 MG chewable tablet Chew 81 mg by mouth daily. Start on 10/26/16 after completion of Xarelto     benzonatate (TESSALON) 100 MG capsule Take 200 mg  by mouth 3 (three) times daily as needed.     furosemide (LASIX) 40 MG tablet TAKE 2 TABLETS (80 MG TOTAL) BY MOUTH DAILY. 180 tablet 3   hydroxychloroquine (PLAQUENIL) 200 MG tablet Take 400 mg by mouth daily.     hyoscyamine (LEVSIN) 0.125 MG tablet Take by mouth 3 (three) times daily as needed.     ipratropium (ATROVENT) 0.06 % nasal spray SMARTSIG:2 Spray(s) Both Nares Twice Daily PRN     ipratropium-albuterol (DUONEB) 0.5-2.5 (3) MG/3ML SOLN      pantoprazole (PROTONIX) 40 MG tablet Take 40 mg by mouth 2 (two) times daily.     predniSONE (DELTASONE) 5 MG tablet Take 5 mg by mouth daily.     sertraline (ZOLOFT) 50 MG tablet      triamcinolone (NASACORT) 55 MCG/ACT AERO nasal inhaler Place 2 sprays into the nose daily.     zolpidem (AMBIEN) 10 MG tablet Take 5-10 mg by mouth at bedtime as needed for sleep.      rosuvastatin (CRESTOR) 40 MG tablet Take 1 tablet (40 mg total) by mouth daily. 90 tablet 3   cetirizine (ZYRTEC) 5 MG tablet Take 1 tablet (5 mg total) by mouth daily. 30 tablet 2   fexofenadine-pseudoephedrine (ALLEGRA-D) 60-120 MG 12 hr tablet Take 1 tablet by mouth daily.     predniSONE (DELTASONE) 20 MG tablet Take 2 tablets (40 mg total) by mouth daily with breakfast. 6 tablet 0   No facility-administered medications prior to visit.      Review of Systems  Review of Systems  Constitutional: Negative.   HENT: Negative.    Respiratory: Negative.  Negative for cough and shortness of breath.      Physical Exam  BP 132/66 (BP Location: Right Arm, Patient Position: Sitting, Cuff Size: Normal)   Pulse 88   Temp 98.3 F (36.8 C) (Oral)   Ht _0  (1.6 m)   Wt 197 lb 12.8 oz (89.7 kg)   SpO2 92%   BMI 35.04 kg/m  Physical Exam Constitutional:      Appearance: Normal appearance. She is not ill-appearing.  HENT:     Head: Normocephalic and atraumatic.     Mouth/Throat:     Mouth: Mucous membranes are moist.     Pharynx: Oropharynx is clear.  Cardiovascular:      Rate and Rhythm: Normal rate and regular rhythm.  Pulmonary:     Effort: Pulmonary effort is normal.     Breath sounds: Rales present. No wheezing or rhonchi.  Neurological:     General: No focal deficit present.     Mental Status: She  is alert and oriented to person, place, and time. Mental status is at baseline.  Psychiatric:        Mood and Affect: Mood normal.        Behavior: Behavior normal.        Thought Content: Thought content normal.        Judgment: Judgment normal.      Lab Results:  CBC    Component Value Date/Time   WBC 8.9 09/30/2022 1203   WBC 10.0 03/16/2021 2027   RBC 4.67 09/30/2022 1203   HGB 15.4 (H) 09/30/2022 1203   HCT 45.1 09/30/2022 1203   PLT 298 09/30/2022 1203   MCV 96.6 09/30/2022 1203   MCH 33.0 09/30/2022 1203   MCHC 34.1 09/30/2022 1203   RDW 13.0 09/30/2022 1203   LYMPHSABS 2.4 09/30/2022 1203   MONOABS 0.9 09/30/2022 1203   EOSABS 0.1 09/30/2022 1203   BASOSABS 0.1 09/30/2022 1203    BMET    Component Value Date/Time   NA 141 09/30/2022 1203   NA 140 09/22/2019 1335   K 3.8 09/30/2022 1203   CL 101 09/30/2022 1203   CO2 35 (H) 09/30/2022 1203   GLUCOSE 154 (H) 09/30/2022 1203   BUN 12 09/30/2022 1203   BUN 14 09/22/2019 1335   CREATININE 0.83 09/30/2022 1203   CREATININE 0.76 08/14/2014 1604   CALCIUM 9.3 09/30/2022 1203   GFRNONAA >60 09/30/2022 1203   GFRNONAA 82 08/14/2014 1604   GFRAA 86 09/22/2019 1335   GFRAA >89 08/14/2014 1604    BNP    Component Value Date/Time   BNP 51.7 08/14/2014 1604    ProBNP No results found for: "PROBNP"  Imaging: [place] LT BREAST BX W LOC DEV 1ST LESION IMG BX SPEC US GUIDE  Addendum Date: 09/28/2022   ADDENDUM REPORT: 09/28/2022 08:13 ADDENDUM: Pathology revealed GRADE I INVASIVE DUCTAL CARCINOMA WITH FOCAL EXTRACELLULAR MUCIN, DUCTAL CARCINOMA IN SITU LOW-GRADE (1) of the LEFT breast, 2:00 o'clock, upper outer quadrant, (ribbon clip). This was found to be concordant by Dr. Lillia Mountain. Pathology results were discussed with the patient by telephone. The patient reported doing well after the biopsy with tenderness and bruising at the site. Post biopsy instructions and care were reviewed and questions were answered. The patient was encouraged to call [facility] for any additional concerns. My direct phone number was provided. The patient was referred to [facility] at [facility] on September 30, 2022. Pathology results reported by Terie Purser, RN on 09/21/2022. Electronically Signed   By: Lillia Mountain M.D.   On: 09/28/2022 08:13   Result Date: 09/28/2022 CLINICAL DATA:  Left breast mass. EXAM: ULTRASOUND GUIDED LEFT BREAST CORE NEEDLE BIOPSY COMPARISON:  Previous exam(s). PROCEDURE: I met with the patient and we discussed the procedure of ultrasound-guided biopsy, including benefits and alternatives. We discussed the high likelihood of a successful procedure. We discussed the risks of the procedure, including infection, bleeding, tissue injury, clip migration, and inadequate sampling. Informed written consent was given. The usual time-out protocol was performed immediately prior to the procedure. Lesion quadrant: Upper-outer quadrant Using sterile technique and 1% Lidocaine as local anesthetic, under direct ultrasound visualization, a 14 gauge spring-loaded device was used to perform biopsy of a mass in the 2 o'clock region of the left breast 2 cm from the nipple using a lateral to medial approach. At the conclusion of the procedure ribbon shaped tissue marker clip was deployed  into the biopsy cavity. Follow up 2 view mammogram was performed and dictated separately. IMPRESSION: Ultrasound guided biopsy of the left breast. No apparent complications. Electronically Signed: By: Lillia Mountain M.D. On: 09/18/2022 14:28  MM CLIP PLACEMENT LEFT  Result Date: 09/18/2022 CLINICAL DATA:  Status post  ultrasound-guided core biopsy of a left breast mass. EXAM: 3D DIAGNOSTIC LEFT MAMMOGRAM POST ULTRASOUND BIOPSY COMPARISON:  Previous exam(s). FINDINGS: 3D Mammographic images were obtained following ultrasound guided biopsy of a left breast mass. The biopsy marking clip is in expected location in the upper-outer quadrant of the left breast. IMPRESSION: Appropriate positioning of the ribbon shaped biopsy marking clip at the site of biopsy in the upper-outer quadrant of the left breast. Final Assessment: Post Procedure Mammograms for Marker Placement Electronically Signed   By: Lillia Mountain M.D.   On: 09/18/2022 14:46  MM DIAG BREAST TOMO UNI LEFT  Result Date: 09/14/2022 CLINICAL DATA:  Patient was recalled from screening mammogram for a possible left breast mass. EXAM: DIGITAL DIAGNOSTIC UNILATERAL LEFT MAMMOGRAM WITH TOMOSYNTHESIS; ULTRASOUND LEFT BREAST LIMITED TECHNIQUE: Left digital diagnostic mammography and breast tomosynthesis was performed.; Targeted ultrasound examination of the left breast was performed. COMPARISON:  Previous exam(s). ACR Breast Density Category b: There are scattered areas of fibroglandular density. FINDINGS: Additional imaging of the left breast was performed. There is persistence of a 5 mm spiculated mass in the upper-outer quadrant of the breast. There are no malignant type microcalcifications. On physical exam, I do not palpate a mass in the upper-outer quadrant of the left breast. Targeted ultrasound is performed, showing a spiculated hypoechoic mass in the left breast at 2 o'clock 2 cm from the nipple measuring 4 x 5 x 4 mm. Sonographic evaluation of the left axilla does not show any enlarged adenopathy. IMPRESSION: Suspicious 5 mm mass in the 2 o'clock region of the left breast 2 cm from the nipple. RECOMMENDATION: Ultrasound-guided core biopsy of the mass in the 2 o'clock region of the left breast 2 cm from the nipple is recommended. The biopsy will be scheduled at the  patient's convenience. I have discussed the findings and recommendations with the patient. If applicable, a reminder letter will be sent to the patient regarding the next appointment. BI-RADS CATEGORY  4: Suspicious. Electronically Signed   By: Lillia Mountain M.D.   On: 09/14/2022 14:23  US BREAST LTD UNI LEFT INC AXILLA  Result Date: 09/14/2022 CLINICAL DATA:  Patient was recalled from screening mammogram for a possible left breast mass. EXAM: DIGITAL DIAGNOSTIC UNILATERAL LEFT MAMMOGRAM WITH TOMOSYNTHESIS; ULTRASOUND LEFT BREAST LIMITED TECHNIQUE: Left digital diagnostic mammography and breast tomosynthesis was performed.; Targeted ultrasound examination of the left breast was performed. COMPARISON:  Previous exam(s). ACR Breast Density Category b: There are scattered areas of fibroglandular density. FINDINGS: Additional imaging of the left breast was performed. There is persistence of a 5 mm spiculated mass in the upper-outer quadrant of the breast. There are no malignant type microcalcifications. On physical exam, I do not palpate a mass in the upper-outer quadrant of the left breast. Targeted ultrasound is performed, showing a spiculated hypoechoic mass in the left breast at 2 o'clock 2 cm from the nipple measuring 4 x 5 x 4 mm. Sonographic evaluation of the left axilla does not show any enlarged adenopathy. IMPRESSION: Suspicious 5 mm mass in the 2 o'clock region of the left breast 2 cm from the nipple. RECOMMENDATION: Ultrasound-guided core biopsy of the mass in the 2 o'clock region of the left breast  2 cm from the nipple is recommended. The biopsy will be scheduled at the patient's convenience. I have discussed the findings and recommendations with the patient. If applicable, a reminder letter will be sent to the patient regarding the next appointment. BI-RADS CATEGORY  4: Suspicious. Electronically Signed   By: Lillia Mountain M.D.   On: 09/14/2022 14:23    Assessment & Plan:   Pulmonary fibrosis  ([place]) - Stable; ILD symptom scale improved. Maintained on Plaquenil and 65m prednisone daily for RA. HRCT chest 08/31/22 stable moderate pulmonary fibrosis (UIP pattern), no progression compared to previous. ONO showed very minimal oxygen desaturation, she spent approximately 5 minutes 33 seconds with O2 level less than or equal to 88%.  Patient does not wish to be started on nocturnal oxygen. Advised she notify [place] O[place] dropping at night. Follow-up in 3 months with Dr. RChase Calleror sooner if needed  Breast cancer ([facility] - Dx with malignant neoplasm left breast in October 2023, stage 1A. Following with Dr. GLindi Adie Planning for breast conserving surgery.    FU in 3 months or sooner  EMartyn Ehrich NP 10/04/2022

## 2022-09-30 ENCOUNTER — Telehealth: Payer: Self-pay | Admitting: Genetic Counselor

## 2022-09-30 ENCOUNTER — Ambulatory Visit: Payer: Medicare HMO | Admitting: Physical Therapy

## 2022-09-30 ENCOUNTER — Other Ambulatory Visit: Payer: Self-pay

## 2022-09-30 ENCOUNTER — Ambulatory Visit
Admission: RE | Admit: 2022-09-30 | Discharge: 2022-09-30 | Disposition: A | Discharge status: Home / Self Care | Payer: Medicare HMO | Source: Ambulatory Visit | Attending: Radiation Oncology | Admitting: Radiation Oncology

## 2022-09-30 ENCOUNTER — Inpatient Hospital Stay: Payer: Medicare HMO | Attending: Hematology and Oncology | Admitting: Hematology and Oncology

## 2022-09-30 ENCOUNTER — Ambulatory Visit: Payer: Self-pay | Admitting: Surgery

## 2022-09-30 ENCOUNTER — Encounter: Payer: Self-pay | Admitting: Emergency Medicine

## 2022-09-30 ENCOUNTER — Encounter: Payer: Self-pay | Admitting: *Deleted

## 2022-09-30 ENCOUNTER — Inpatient Hospital Stay: Payer: Medicare HMO

## 2022-09-30 DIAGNOSIS — Z170 Estrogen receptor positive status [ER+]: Secondary | ICD-10-CM

## 2022-09-30 DIAGNOSIS — Z87891 Personal history of nicotine dependence: Secondary | ICD-10-CM | POA: Diagnosis not present

## 2022-09-30 DIAGNOSIS — C50912 Malignant neoplasm of unspecified site of left female breast: Secondary | ICD-10-CM | POA: Diagnosis not present

## 2022-09-30 DIAGNOSIS — C50412 Malignant neoplasm of upper-outer quadrant of left female breast: Secondary | ICD-10-CM | POA: Insufficient documentation

## 2022-09-30 LAB — CBC WITH DIFFERENTIAL (CANCER CENTER ONLY)
Abs Immature Granulocytes: 0.02 10*3/uL (ref 0.00–0.07)
Basophils Absolute: 0.1 10*3/uL (ref 0.0–0.1)
Basophils Relative: 1 %
Eosinophils Absolute: 0.1 10*3/uL (ref 0.0–0.5)
Eosinophils Relative: 2 %
HCT: 45.1 % (ref 36.0–46.0)
Hemoglobin: 15.4 g/dL — ABNORMAL HIGH (ref 12.0–15.0)
Immature Granulocytes: 0 %
Lymphocytes Relative: 27 %
Lymphs Abs: 2.4 10*3/uL (ref 0.7–4.0)
MCH: 33 pg (ref 26.0–34.0)
MCHC: 34.1 g/dL (ref 30.0–36.0)
MCV: 96.6 fL (ref 80.0–100.0)
Monocytes Absolute: 0.9 10*3/uL (ref 0.1–1.0)
Monocytes Relative: 10 %
Neutro Abs: 5.4 10*3/uL (ref 1.7–7.7)
Neutrophils Relative %: 60 %
Platelet Count: 298 10*3/uL (ref 150–400)
RBC: 4.67 MIL/uL (ref 3.87–5.11)
RDW: 13 % (ref 11.5–15.5)
WBC Count: 8.9 10*3/uL (ref 4.0–10.5)
nRBC: 0 % (ref 0.0–0.2)

## 2022-09-30 LAB — CMP (CANCER CENTER ONLY)
ALT: 24 U/L (ref 0–44)
AST: 29 U/L (ref 15–41)
Albumin: 4.1 g/dL (ref 3.5–5.0)
Alkaline Phosphatase: 92 U/L (ref 38–126)
Anion gap: 5 (ref 5–15)
BUN: 12 mg/dL (ref 8–23)
CO2: 35 mmol/L — ABNORMAL HIGH (ref 22–32)
Calcium: 9.3 mg/dL (ref 8.9–10.3)
Chloride: 101 mmol/L (ref 98–111)
Creatinine: 0.83 mg/dL (ref 0.44–1.00)
GFR, Estimated: >60 mL/min (ref >60)
Glucose, Bld: 154 mg/dL — ABNORMAL HIGH (ref 70–99)
Potassium: 3.8 mmol/L (ref 3.5–5.1)
Sodium: 141 mmol/L (ref 135–145)
Total Bilirubin: 0.9 mg/dL (ref 0.3–1.2)
Total Protein: 7.3 g/dL (ref 6.5–8.1)

## 2022-09-30 LAB — GENETIC SCREENING ORDER

## 2022-09-30 NOTE — Progress Notes (Signed)
[place] NOTE  Patient Care Team: Asencion Noble, MD as PCP - General (Internal Medicine) Stanford Breed Denice Bors, MD as PCP - Cardiology (Cardiology) Elsie Stain, MD as Attending Physician (Pulmonary Disease) Rockwell Germany, RN as Oncology Nurse Navigator Mauro Kaufmann, RN as Oncology Nurse Navigator Erroll Luna, MD as Consulting Physician (General Surgery) Nicholas Lose, MD as Consulting Physician (Hematology and Oncology) Kyung Rudd, MD as Consulting Physician (Radiation Oncology)  CHIEF COMPLAINTS/PURPOSE OF CONSULTATION:  Newly diagnosed breast cancer  HISTORY OF PRESENTING ILLNESS:  [name] 76 y.o. female is here because of recent diagnosis of left breast cancer.  Patient had a routine screening mammogram that detected left breast mass at 2 o'clock position measuring 0.5 cm.  Axilla was negative.  Biopsy of this mass revealed grade 1 invasive ductal carcinoma with focal along with low-grade DCIS.  The tumor is ER 100%, PR 99%, Ki-67 5%, HER2 negative She was presented this morning in the multidisciplinary tumor board and she is here today accompanied by her husband to discuss treatment plan.  I reviewed her records extensively and collaborated the history with the patient.  SUMMARY OF ONCOLOGIC HISTORY: Oncology History  Malignant neoplasm of upper-outer quadrant of left breast in female, estrogen receptor positive ([place])  09/18/2022 Initial Diagnosis   Screening mammogram detected left breast mass at 2 o'clock position measuring 0.5 cm, axilla negative, biopsy revealed grade 1 IDC with focal extracellular mucin plus grade 1 DCIS ER 100%, PR 99%, Ki-67 5%, HER2 1+, FISH negative   09/30/2022 Cancer Staging   Staging form: Breast, AJCC 8th Edition - Clinical: Stage IA (cT1b, cN0, cM0, G1, ER+, PR+, HER2-) - Signed by Nicholas Lose, MD on 09/30/2022 Histologic grading system: 3 grade system      MEDICAL HISTORY:  Past Medical History:   Diagnosis Date   Diastolic congestive heart failure ([place])    02/05/15  states no fluid in extremities, breathing same as has been   GERD (gastroesophageal reflux disease)    History of blood transfusion    Hx of cholecystectomy    Interstitial lung disease (HCC)    Osteoarthritis    Pulmonary fibrosis (HCC)    Raynaud disease    Rheumatoid arthritis(714.0)    Scleroderma (HCC)    Seasonal allergies    Shortness of breath dyspnea    states normal for her   Systemic sclerosis ([place])     SURGICAL HISTORY: Past Surgical History:  Procedure Laterality Date   ABDOMINAL HYSTERECTOMY     BLADDER SUSPENSION     CARPAL TUNNEL RELEASE     LAPAROSCOPIC CHOLECYSTECTOMY     LEFT HEART CATHETERIZATION WITH CORONARY/GRAFT ANGIOGRAM  03/15/2014   Procedure: LEFT HEART CATHETERIZATION WITH Beatrix Fetters;  Surgeon: Burnell Blanks, MD;  Location: [facility] C[facility] LAB;  Service: Cardiovascular;;   TONSILLECTOMY     TOTAL KNEE ARTHROPLASTY Left 02/11/2015   Procedure: LEFT TOTAL KNEE ARTHROPLASTY, RIGHT KNEE CORTISONE INJECTION;  Surgeon: Gaynelle Arabian, MD;  Location: [facility] ORS;  Service: Orthopedics;  Laterality: Left;   TOTAL KNEE ARTHROPLASTY Right 10/05/2016   Procedure: RIGHT TOTAL KNEE ARTHROPLASTY;  Surgeon: Gaynelle Arabian, MD;  Location: [facility] ORS;  Service: Orthopedics;  Laterality: Right;   VESICOVAGINAL FISTULA CLOSURE W/ TAH  1987    SOCIAL HISTORY: Social History   Socioeconomic History   Marital status: Married    Spouse name: Not on file   Number of children: 3   Years of education: Not on file  Highest education level: Some college, no degree  Occupational History   Occupation: Optician, dispensing: [employer]  Tobacco Use   Smoking status: Former    Packs/day: 1.00    Years: 20.00    Total pack years: 20.00    Types: Cigarettes    Quit date: 11/30/1988    Years since quitting: 33.8   Smokeless tobacco: Never  Substance and Sexual Activity    Alcohol use: No   Drug use: No   Sexual activity: Not on file  Other Topics Concern   Not on file  Social History Narrative   Not on file   Social Determinants of Health   Financial Resource Strain: Not on file  Food Insecurity: Not on file  Transportation Needs: Not on file  Physical Activity: Not on file  Stress: Not on file  Social Connections: Not on file  Intimate Partner Violence: Not on file    FAMILY HISTORY: Family History  Problem Relation Age of Onset   Stroke Mother    Heart disease Mother        CABG at age 71   Heart attack Father        Died of MI at age 18   Emphysema Father    Dementia Brother    Osteoarthritis Other    Celiac disease Other     ALLERGIES:  is allergic to codeine.  MEDICATIONS:  Current Outpatient Medications  Medication Sig Dispense Refill   acetaminophen (TYLENOL) 325 MG tablet Take 2 tablets (650 mg total) by mouth every 6 (six) hours as needed for mild pain (or Fever >/= 101). 40 tablet 0   albuterol (VENTOLIN HFA) 108 (90 Base) MCG/ACT inhaler INHALE 2 PUFFS INTO THE LUNGS EVERY 6 (SIX) HOURS AS NEEDED FOR WHEEZING. 1 each 1   amLODipine (NORVASC) 2.5 MG tablet 1 tablet     aspirin 81 MG chewable tablet Chew 81 mg by mouth daily. Start on 10/26/16 after completion of Xarelto     benzonatate (TESSALON) 100 MG capsule Take 200 mg by mouth 3 (three) times daily as needed.     furosemide (LASIX) 40 MG tablet TAKE 2 TABLETS (80 MG TOTAL) BY MOUTH DAILY. 180 tablet 3   hydroxychloroquine (PLAQUENIL) 200 MG tablet Take 400 mg by mouth daily.     hyoscyamine (LEVSIN) 0.125 MG tablet Take by mouth 3 (three) times daily as needed.     ipratropium (ATROVENT) 0.06 % nasal spray SMARTSIG:2 Spray(s) Both Nares Twice Daily PRN     ipratropium-albuterol (DUONEB) 0.5-2.5 (3) MG/3ML SOLN      pantoprazole (PROTONIX) 40 MG tablet Take 40 mg by mouth 2 (two) times daily.     predniSONE (DELTASONE) 5 MG tablet Take 5 mg by mouth daily.      rosuvastatin (CRESTOR) 40 MG tablet Take 1 tablet (40 mg total) by mouth daily. 90 tablet 3   sertraline (ZOLOFT) 50 MG tablet      triamcinolone (NASACORT) 55 MCG/ACT AERO nasal inhaler Place 2 sprays into the nose daily.     zolpidem (AMBIEN) 10 MG tablet Take 5-10 mg by mouth at bedtime as needed for sleep.      No current facility-administered medications for this visit.    REVIEW OF SYSTEMS:   Constitutional: Denies fevers, chills or abnormal night sweats Breast:  Denies any palpable lumps or discharge All other systems were reviewed with the patient and are negative.  PHYSICAL EXAMINATION: ECOG PERFORMANCE STATUS: 1 - Symptomatic  but completely ambulatory  Vitals:   09/30/22 1214  BP: (!) 150/71  Pulse: 85  Resp: 18  Temp: (!) 97.3 F (36.3 C)  SpO2: 100%   Filed Weights   09/30/22 1214  Weight: 198 lb 3.2 oz (89.9 kg)    GENERAL:alert, no distress and comfortable     LABORATORY DATA:  I have reviewed the data as listed Lab Results  Component Value Date   WBC 8.9 09/30/2022   HGB 15.4 (H) 09/30/2022   HCT 45.1 09/30/2022   MCV 96.6 09/30/2022   PLT 298 09/30/2022   Lab Results  Component Value Date   NA 141 09/30/2022   K 3.8 09/30/2022   CL 101 09/30/2022   CO2 35 (H) 09/30/2022    RADIOGRAPHIC STUDIES: I have personally reviewed the radiological reports and agreed with the findings in the report.  ASSESSMENT AND PLAN:  Malignant neoplasm of upper-outer quadrant of left breast in female, estrogen receptor positive ([place]) 09/18/2022:Screening mammogram detected left breast mass at 2 o'clock position measuring 0.5 cm, axilla negative, biopsy revealed grade 1 IDC with focal extracellular mucin plus grade 1 DCIS ER 100%, PR 99%, Ki-67 5%, HER2 1+, FISH negative  Pathology and radiology counseling: Discussed with the patient, the details of pathology including the type of breast cancer,the clinical staging, the significance of ER, PR and HER-2/neu receptors  and the implications for treatment. After reviewing the pathology in detail, we proceeded to discuss the different treatment options between surgery, radiation, chemotherapy, antiestrogen therapies.  Treatment plan: 1.  Breast conserving surgery 2. no role of radiation since she has history of scleroderma 3.  Adjuvant antiestrogen therapy with anastrozole 1 mg daily x5 years  Return to clinic after surgery for follow-up  All questions were answered. The patient knows to call the clinic with any problems, questions or concerns.    Harriette Ohara, MD 09/30/22

## 2022-09-30 NOTE — Research (Signed)
[employer] 2021-05 - Specimen Collection Study to Evaluate Biomarkers in Subjects with Cancer   Patient [name] was identified by Clinical Research as a potential candidate for the above listed study.  This Clinical Research Nurse met with [name], [id number], on 09/30/22 in a manner and location that ensures patient privacy to discuss participation in the above listed research study.  Patient is Accompanied by her husband .  A copy of the informed consent document with embedded HIPAA language was provided to the patient.  Patient reads, speaks, and understands [name].   Patient was provided with the business card of this Nurse and encouraged to contact the research team with any questions.  Approximately 20 minutes were spent with the patient reviewing the informed consent documents.  Patient was provided the option of taking informed consent documents home to review and was encouraged to review at their convenience with their support network, including other care providers. Patient took the consent documents home to review.  Will call to f/u on interest in next few days.  Wells Guiles 'Learta CoddingNeysa Bonito, RN, BSN Clinical Research Nurse I 09/30/22 3:34 PM

## 2022-09-30 NOTE — Assessment & Plan Note (Addendum)
09/18/2022:Screening mammogram detected left breast mass at 2 o'clock position measuring 0.5 cm, axilla negative, biopsy revealed grade 1 IDC with focal extracellular mucin plus grade 1 DCIS ER 100%, PR 99%, Ki-67 5%, HER2 1+, FISH negative  Pathology and radiology counseling: Discussed with the patient, the details of pathology including the type of breast cancer,the clinical staging, the significance of ER, PR and HER-2/neu receptors and the implications for treatment. After reviewing the pathology in detail, we proceeded to discuss the different treatment options between surgery, radiation, chemotherapy, antiestrogen therapies.  Treatment plan: 1.  Breast conserving surgery 2. no role of radiation since she has history of scleroderma 3.  Adjuvant antiestrogen therapy with anastrozole 1 mg daily x5 years  Return to clinic after surgery for follow-up

## 2022-09-30 NOTE — Progress Notes (Signed)
[facility] Psychosocial Distress Screening Spiritual Care  Met with [name] and her husband, [name], in [facility] to introduce [facility] [place] team/resources, reviewing distress screen per protocol.  The patient scored a 5 on the Psychosocial Distress Thermometer which indicates moderate distress. Also assessed for distress and other psychosocial needs.      09/30/2022    3:46 PM  ONCBCN DISTRESS SCREENING  Distress experienced in past week (1-10) 5  Emotional problem type Nervousness/Anxiety;Adjusting to illness  Physical Problem type Breathing  Referral to support programs Yes   Lysle Morales, counseling intern, met with patient and her husband in clinic. The patient reported they feel much better because they received the "best news" they could receive. 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 The patient reported they felt at peace with the information they were given today.   The patient has the counselor's numbers in case needs arise.   Follow up needed: No.  [place]  Counseling Intern,  [phone] [email or web address]

## 2022-09-30 NOTE — Telephone Encounter (Signed)
[name] was seen by a genetic counselor during the breast multidisciplinary clinic on 09/30/2022. In addition to her personal history of breast cancer, she reported a family history of a father diagnosed with prostate cancer at age 74. She does not meet NCCN criteria for genetic testing at this time. She was still offered genetic counseling and testing but declined. We encourage her to contact us if there are any changes to her personal or family history of cancer. If she meets NCCN criteria based on the updated personal/family history, she would be recommended to have genetic counseling and testing.   Lucille Passy, MS, [facility] Genetic Counselor [name] (P) [phone]

## 2022-09-30 NOTE — Progress Notes (Signed)
Radiation Oncology         [phone] ________________________________  Name: [name]        MRN: [id number]  Date of Service: 09/30/2022 DOB: 07/01/1946  WE:[id number], Carloyn Manner, MD  Erroll Luna, MD     REFERRING PHYSICIAN: Erroll Luna, MD   DIAGNOSIS: The encounter diagnosis was Malignant neoplasm of upper-outer quadrant of left breast in female, estrogen receptor positive ([place]).   HISTORY OF PRESENT ILLNESS: [name] is a 76 y.o. female seen in the multidisciplinary breast clinic for a new diagnosis of left breast cancer. The patient was noted to have screening detected mass in the left breast. By ultrasound this measured 5 mm in the 2:00 position. The axilla was negative for adenopathy. She underwent a biopsy on 09/18/22 that showed a grade 1 invasive ductal carcinoma that was ER/PR positive, HER2 negative with a Ki 67 of 1%. She's seen today to discuss treatment recommendations of her cancer.    PREVIOUS RADIATION THERAPY: No   PAST MEDICAL HISTORY:  Past Medical History:  Diagnosis Date   Diastolic congestive heart failure ([place])    02/05/15  states no fluid in extremities, breathing same as has been   GERD (gastroesophageal reflux disease)    History of blood transfusion    Hx of cholecystectomy    Interstitial lung disease (HCC)    Osteoarthritis    Pulmonary fibrosis (HCC)    Raynaud disease    Rheumatoid arthritis(714.0)    Scleroderma (HCC)    Seasonal allergies    Shortness of breath dyspnea    states normal for her   Systemic sclerosis ([place])        PAST SURGICAL HISTORY: Past Surgical History:  Procedure Laterality Date   ABDOMINAL HYSTERECTOMY     BLADDER SUSPENSION     CARPAL TUNNEL RELEASE     LAPAROSCOPIC CHOLECYSTECTOMY     LEFT HEART CATHETERIZATION WITH CORONARY/GRAFT ANGIOGRAM  03/15/2014   Procedure: LEFT HEART CATHETERIZATION WITH Beatrix Fetters;  Surgeon: Burnell Blanks, MD;  Location[facility] C[facility] LAB;  Service:  Cardiovascular;;   TONSILLECTOMY     TOTAL KNEE ARTHROPLASTY Left 02/11/2015   Procedure: LEFT TOTAL KNEE ARTHROPLASTY, RIGHT KNEE CORTISONE INJECTION;  Surgeon: Gaynelle Arabian, MD;  Location: [facility] ORS;  Service: Orthopedics;  Laterality: Left;   TOTAL KNEE ARTHROPLASTY Right 10/05/2016   Procedure: RIGHT TOTAL KNEE ARTHROPLASTY;  Surgeon: Gaynelle Arabian, MD;  Location: [facility] ORS;  Service: Orthopedics;  Laterality: Right;   VESICOVAGINAL FISTULA CLOSURE W/ TAH  1987     FAMILY HISTORY:  Family History  Problem Relation Age of Onset   Stroke Mother    Heart disease Mother        CABG at age 72   Heart attack Father        Died of MI at age 74   Emphysema Father    Dementia Brother    Osteoarthritis Other    Celiac disease Other      SOCIAL HISTORY:  reports that she quit smoking about 33 years ago. Her smoking use included cigarettes. She has a 20.00 pack-year smoking history. She has never used smokeless tobacco. She reports that she does not drink alcohol and does not use drugs. The patient is married and lives in [place]. She is a retired Therapist, sports. She lives in [place] and is accompanied by her husband.    ALLERGIES: Codeine   MEDICATIONS:  Current Outpatient Medications  Medication Sig Dispense Refill   acetaminophen (TYLENOL) 325  MG tablet Take 2 tablets (650 mg total) by mouth every 6 (six) hours as needed for mild pain (or Fever >/= 101). 40 tablet 0   albuterol (VENTOLIN HFA) 108 (90 Base) MCG/ACT inhaler INHALE 2 PUFFS INTO THE LUNGS EVERY 6 (SIX) HOURS AS NEEDED FOR WHEEZING. 1 each 1   amLODipine (NORVASC) 2.5 MG tablet 1 tablet     aspirin 81 MG chewable tablet Chew 81 mg by mouth daily. Start on 10/26/16 after completion of Xarelto     benzonatate (TESSALON) 100 MG capsule Take 200 mg by mouth 3 (three) times daily as needed.     furosemide (LASIX) 40 MG tablet TAKE 2 TABLETS (80 MG TOTAL) BY MOUTH DAILY. 180 tablet 3   hydroxychloroquine (PLAQUENIL) 200 MG tablet Take 400 mg by  mouth daily.     hyoscyamine (LEVSIN) 0.125 MG tablet Take by mouth 3 (three) times daily as needed.     ipratropium (ATROVENT) 0.06 % nasal spray SMARTSIG:2 Spray(s) Both Nares Twice Daily PRN     ipratropium-albuterol (DUONEB) 0.5-2.5 (3) MG/3ML SOLN      pantoprazole (PROTONIX) 40 MG tablet Take 40 mg by mouth 2 (two) times daily.     predniSONE (DELTASONE) 5 MG tablet Take 5 mg by mouth daily.     rosuvastatin (CRESTOR) 40 MG tablet Take 1 tablet (40 mg total) by mouth daily. 90 tablet 3   sertraline (ZOLOFT) 50 MG tablet      triamcinolone (NASACORT) 55 MCG/ACT AERO nasal inhaler Place 2 sprays into the nose daily.     zolpidem (AMBIEN) 10 MG tablet Take 5-10 mg by mouth at bedtime as needed for sleep.      No current facility-administered medications for this encounter.     REVIEW OF SYSTEMS: On review of systems, the patient reports that she is doing well. No breast specific complaints are noted.     PHYSICAL EXAM:  Wt Readings from Last 3 Encounters:  09/28/22 197 lb 12.8 oz (89.7 kg)  08/20/22 198 lb (89.8 kg)  06/19/22 197 lb 12.8 oz (89.7 kg)   Temp Readings from Last 3 Encounters:  09/28/22 98.3 F (36.8 C) (Oral)  09/16/22 98.1 F (36.7 C) (Oral)  08/20/22 97.8 F (36.6 C) (Oral)   BP Readings from Last 3 Encounters:  09/28/22 132/66  09/16/22 130/77  08/20/22 132/70   Pulse Readings from Last 3 Encounters:  09/28/22 88  09/16/22 91  08/20/22 91    In general this is a well appearing caucasian female in no acute distress. She's alert and oriented x4 and appropriate throughout the examination. Cardiopulmonary assessment is negative for acute distress and she exhibits normal effort. Bilateral breast exam is deferred.    ECOG = 0  0 - Asymptomatic (Fully active, able to carry on all predisease activities without restriction)  1 - Symptomatic but completely ambulatory (Restricted in physically strenuous activity but ambulatory and able to carry out work of  a light or sedentary nature. For example, light housework, office work)  2 - Symptomatic, <50% in bed during the day (Ambulatory and capable of all self care but unable to carry out any work activities. Up and about more than 50% of waking hours)  3 - Symptomatic, >50% in bed, but not bedbound (Capable of only limited self-care, confined to bed or chair 50% or more of waking hours)  4 - Bedbound (Completely disabled. Cannot carry on any self-care. Totally confined to bed or chair)  5 - Death  Oken MM, Creech RH, Tormey DC, et al. [phone]). "Toxicity and response criteria of the [facility]". [place] Oncol. 5 (6): 649-55    LABORATORY DATA:  Lab Results  Component Value Date   WBC 10.0 03/16/2021   HGB 14.8 03/16/2021   HCT 43.5 03/16/2021   MCV 97.8 03/16/2021   PLT 277 03/16/2021   Lab Results  Component Value Date   NA 139 03/16/2021   K 3.0 (L) 03/16/2021   CL 100 03/16/2021   CO2 28 03/16/2021   Lab Results  Component Value Date   ALT 33 03/16/2021   AST 40 03/16/2021   ALKPHOS 87 03/16/2021   BILITOT 0.6 03/16/2021      RADIOGRAPHY: [place] LT BREAST BX W LOC DEV 1ST LESION IMG BX SPEC US GUIDE  Addendum Date: 09/28/2022   ADDENDUM REPORT: 09/28/2022 08:13 ADDENDUM: Pathology revealed GRADE I INVASIVE DUCTAL CARCINOMA WITH FOCAL EXTRACELLULAR MUCIN, DUCTAL CARCINOMA IN SITU LOW-GRADE (1) of the LEFT breast, 2:00 o'clock, upper outer quadrant, (ribbon clip). This was found to be concordant by Dr. Lillia Mountain. Pathology results were discussed with the patient by telephone. The patient reported doing well after the biopsy with tenderness and bruising at the site. Post biopsy instructions and care were reviewed and questions were answered. The patient was encouraged to call [facility] for any additional concerns. My direct phone number was provided. The patient was referred to [facility] at [facility] on September 30, 2022. Pathology results reported by Terie Purser, RN on 09/21/2022. Electronically Signed   By: Lillia Mountain M.D.   On: 09/28/2022 08:13   Result Date: 09/28/2022 CLINICAL DATA:  Left breast mass. EXAM: ULTRASOUND GUIDED LEFT BREAST CORE NEEDLE BIOPSY COMPARISON:  Previous exam(s). PROCEDURE: I met with the patient and we discussed the procedure of ultrasound-guided biopsy, including benefits and alternatives. We discussed the high likelihood of a successful procedure. We discussed the risks of the procedure, including infection, bleeding, tissue injury, clip migration, and inadequate sampling. Informed written consent was given. The usual time-out protocol was performed immediately prior to the procedure. Lesion quadrant: Upper-outer quadrant Using sterile technique and 1% Lidocaine as local anesthetic, under direct ultrasound visualization, a 14 gauge spring-loaded device was used to perform biopsy of a mass in the 2 o'clock region of the left breast 2 cm from the nipple using a lateral to medial approach. At the conclusion of the procedure ribbon shaped tissue marker clip was deployed into the biopsy cavity. Follow up 2 view mammogram was performed and dictated separately. IMPRESSION: Ultrasound guided biopsy of the left breast. No apparent complications. Electronically Signed: By: Lillia Mountain M.D. On: 09/18/2022 14:28  MM CLIP PLACEMENT LEFT  Result Date: 09/18/2022 CLINICAL DATA:  Status post ultrasound-guided core biopsy of a left breast mass. EXAM: 3D DIAGNOSTIC LEFT MAMMOGRAM POST ULTRASOUND BIOPSY COMPARISON:  Previous exam(s). FINDINGS: 3D Mammographic images were obtained following ultrasound guided biopsy of a left breast mass. The biopsy marking clip is in expected location in the upper-outer quadrant of the left breast. IMPRESSION: Appropriate positioning of the ribbon shaped biopsy marking clip at the site of biopsy in the upper-outer  quadrant of the left breast. Final Assessment: Post Procedure Mammograms for Marker Placement Electronically Signed   By: Lillia Mountain M.D.   On: 09/18/2022 14:46  MM DIAG BREAST TOMO UNI LEFT  Result Date: 09/14/2022 CLINICAL DATA:  Patient was recalled  from screening mammogram for a possible left breast mass. EXAM: DIGITAL DIAGNOSTIC UNILATERAL LEFT MAMMOGRAM WITH TOMOSYNTHESIS; ULTRASOUND LEFT BREAST LIMITED TECHNIQUE: Left digital diagnostic mammography and breast tomosynthesis was performed.; Targeted ultrasound examination of the left breast was performed. COMPARISON:  Previous exam(s). ACR Breast Density Category b: There are scattered areas of fibroglandular density. FINDINGS: Additional imaging of the left breast was performed. There is persistence of a 5 mm spiculated mass in the upper-outer quadrant of the breast. There are no malignant type microcalcifications. On physical exam, I do not palpate a mass in the upper-outer quadrant of the left breast. Targeted ultrasound is performed, showing a spiculated hypoechoic mass in the left breast at 2 o'clock 2 cm from the nipple measuring 4 x 5 x 4 mm. Sonographic evaluation of the left axilla does not show any enlarged adenopathy. IMPRESSION: Suspicious 5 mm mass in the 2 o'clock region of the left breast 2 cm from the nipple. RECOMMENDATION: Ultrasound-guided core biopsy of the mass in the 2 o'clock region of the left breast 2 cm from the nipple is recommended. The biopsy will be scheduled at the patient's convenience. I have discussed the findings and recommendations with the patient. If applicable, a reminder letter will be sent to the patient regarding the next appointment. BI-RADS CATEGORY  4: Suspicious. Electronically Signed   By: Lillia Mountain M.D.   On: 09/14/2022 14:23  US BREAST LTD UNI LEFT INC AXILLA  Result Date: 09/14/2022 CLINICAL DATA:  Patient was recalled from screening mammogram for a possible left breast mass. EXAM: DIGITAL  DIAGNOSTIC UNILATERAL LEFT MAMMOGRAM WITH TOMOSYNTHESIS; ULTRASOUND LEFT BREAST LIMITED TECHNIQUE: Left digital diagnostic mammography and breast tomosynthesis was performed.; Targeted ultrasound examination of the left breast was performed. COMPARISON:  Previous exam(s). ACR Breast Density Category b: There are scattered areas of fibroglandular density. FINDINGS: Additional imaging of the left breast was performed. There is persistence of a 5 mm spiculated mass in the upper-outer quadrant of the breast. There are no malignant type microcalcifications. On physical exam, I do not palpate a mass in the upper-outer quadrant of the left breast. Targeted ultrasound is performed, showing a spiculated hypoechoic mass in the left breast at 2 o'clock 2 cm from the nipple measuring 4 x 5 x 4 mm. Sonographic evaluation of the left axilla does not show any enlarged adenopathy. IMPRESSION: Suspicious 5 mm mass in the 2 o'clock region of the left breast 2 cm from the nipple. RECOMMENDATION: Ultrasound-guided core biopsy of the mass in the 2 o'clock region of the left breast 2 cm from the nipple is recommended. The biopsy will be scheduled at the patient's convenience. I have discussed the findings and recommendations with the patient. If applicable, a reminder letter will be sent to the patient regarding the next appointment. BI-RADS CATEGORY  4: Suspicious. Electronically Signed   By: Lillia Mountain M.D.   On: 09/14/2022 14:23  CT Chest High Resolution  Result Date: 08/31/2022 CLINICAL DATA:  76 year old female with history of interstitial lung disease. Follow-up study. EXAM: CT CHEST WITHOUT CONTRAST TECHNIQUE: Multidetector CT imaging of the chest was performed following the standard protocol without intravenous contrast. High resolution imaging of the lungs, as well as inspiratory and expiratory imaging, was performed. RADIATION DOSE REDUCTION: This exam was performed according to the departmental dose-optimization program  which includes automated exposure control, adjustment of the mA and/or kV according to patient size and/or use of iterative reconstruction technique. COMPARISON:  High-resolution chest CT 12/10/2020. FINDINGS: Cardiovascular: Heart size  is borderline enlarged. There is no significant pericardial fluid, thickening or pericardial calcification. There is aortic atherosclerosis, as well as atherosclerosis of the great vessels of the mediastinum and the coronary arteries, including calcified atherosclerotic plaque in the left anterior descending, left circumflex and right coronary arteries. Calcifications of the aortic valve. Mediastinum/Nodes: No pathologically enlarged mediastinal or hilar lymph nodes. Please note that accurate exclusion of hilar adenopathy is limited on noncontrast CT scans. Small hiatal hernia. No axillary lymphadenopathy. Lungs/Pleura: High-resolution images demonstrate widespread areas of mild ground-glass attenuation, extensive septal thickening, thickening of the peribronchovascular interstitium, cylindrical bronchiectasis, peripheral bronchiolectasis and honeycombing scattered throughout the lungs bilaterally with a definitive craniocaudal gradient. Findings appear stable compared to the prior examination. No acute consolidative airspace disease. No pleural effusions. Inspiratory and expiratory imaging is unremarkable. No definite suspicious appearing pulmonary nodules or masses are noted. Upper Abdomen: Diffuse low attenuation throughout the visualized hepatic parenchyma, indicative of a background of hepatic steatosis. Liver has a nodular contour, indicative of underlying cirrhosis. Status post cholecystectomy. Atherosclerotic calcifications in the abdominal aorta. Musculoskeletal: There are no aggressive appearing lytic or blastic lesions noted in the visualized portions of the skeleton. IMPRESSION: 1. The appearance of the lungs is unchanged compared to the prior study, with a spectrum of  findings once again considered diagnostic of usual interstitial pneumonia (UIP) per current ATS guidelines. However, given the stability in the appearance of the lungs compared to remote prior studies dating back to 2013, an alternative diagnosis such as fibrotic phase nonspecific interstitial pneumonia (NSIP) warrants consideration. 2. Aortic atherosclerosis, in addition to three-vessel coronary artery disease. Assessment for potential risk factor modification, dietary therapy or pharmacologic therapy may be warranted, if clinically indicated. 3. There are calcifications of the aortic valve. Echocardiographic correlation for evaluation of potential valvular dysfunction may be warranted if clinically indicated. 4. Cirrhosis with severe hepatic steatosis. Aortic Atherosclerosis (ICD10-I70.0). Electronically Signed   By: Vinnie Langton M.D.   On: 08/31/2022 09:47       IMPRESSION/PLAN: 1. Stage IA, cT1bN0M0, grade 1, ER/PR positive invasive ductal carcinoma of the left breast. Dr. Lisbeth Renshaw discusses the pathology findings and reviews the nature of early stage breast disease. The consensus from the breast conference includes breast conservation with lumpectomy. Depending on the size of the final tumor measurements rendered by pathology, the tumor may be tested for Oncotype Dx score to determine a role for systemic therapy. Dr. Lisbeth Renshaw reviews the consideration for adjvuant radiotherapy to reduce risks of recurrence, but also discusses cases in which radiation may be optional for favorable cases based on final pathology.  Given her scleroderma, it would be favored to avoid radiotherapy due to risks of long term fibrosis. We will follow-up with her final results of surgery to determine next steps, and if she needs further discussion regarding radiotherapy.  2. Possible genetic predisposition to malignancy. The patient is a candidate for genetic testing given her personal and family history. She will meet with our  geneticist today in clinic. 3. Systemic Sclerosis and Scleroderma. As per #1, the patient's connective tissue disease is concerning for long term risks of fibrosis and scaring of the tissues of the chest and also risks to the lung. She is in favor of avoiding radiation as discussed.    In a visit lasting 60 minutes, greater than 50% of the time was spent face to face reviewing her case, as well as in preparation of, discussing, and coordinating the patient's care.  The above documentation reflects my direct findings  during this shared patient visit. Please see the separate note by Dr. Lisbeth Renshaw on this date for the remainder of the patient's plan of care.    Carola Rhine, [facility]    **Disclaimer: This note was dictated with voice recognition software. Similar sounding words can inadvertently be transcribed and this note may contain transcription errors which may not have been corrected upon publication of note.**

## 2022-10-02 ENCOUNTER — Telehealth: Payer: Self-pay | Admitting: Emergency Medicine

## 2022-10-02 NOTE — Telephone Encounter (Signed)
[employer] 2021-05 - Specimen Collection Study to Evaluate Biomarkers in Subjects with Cancer   Called to f/u on interest in study.  Pt is not interested in participating at this time.  Pt denies any questions or concerns, aware to f/u as needed.  Wells Guiles 'Learta CoddingNeysa Bonito, RN, BSN Clinical Research Nurse I 10/02/22 1:04 PM

## 2022-10-04 DIAGNOSIS — C50919 Malignant neoplasm of unspecified site of unspecified female breast: Secondary | ICD-10-CM | POA: Insufficient documentation

## 2022-10-04 NOTE — Assessment & Plan Note (Addendum)
-  Stable; ILD symptom scale improved. Maintained on Plaquenil and 26m prednisone daily for RA. HRCT chest 08/31/22 stable moderate pulmonary fibrosis (UIP pattern), no progression compared to previous. ONO showed very minimal oxygen desaturation, she spent approximately 5 minutes 33 seconds with O2 level less than or equal to 88%.  Patient does not wish to be started on nocturnal oxygen. Advised she notify [place] O[place] dropping at night. Follow-up in 3 months with Dr. RChase Calleror sooner if needed

## 2022-10-04 NOTE — Assessment & Plan Note (Addendum)
-  Dx with malignant neoplasm left breast in October 2023, stage 1A. Following with Dr. Lindi Adie. Planning for breast conserving surgery.

## 2022-10-05 ENCOUNTER — Other Ambulatory Visit: Payer: Self-pay | Admitting: Cardiology

## 2022-10-05 ENCOUNTER — Other Ambulatory Visit: Payer: Self-pay | Admitting: Surgery

## 2022-10-05 DIAGNOSIS — R002 Palpitations: Secondary | ICD-10-CM

## 2022-10-05 DIAGNOSIS — C50912 Malignant neoplasm of unspecified site of left female breast: Secondary | ICD-10-CM

## 2022-10-08 ENCOUNTER — Telehealth: Payer: Self-pay | Admitting: *Deleted

## 2022-10-08 ENCOUNTER — Encounter: Payer: Self-pay | Admitting: *Deleted

## 2022-10-08 NOTE — Telephone Encounter (Signed)
Spoke with patient to follow up from [facility] 11/1 an assess navigation needs. Patient denies any questions or concerns at this time. Encouraged her to call should anything arise. Patient verbalized understanding.

## 2022-10-21 DIAGNOSIS — M25511 Pain in right shoulder: Secondary | ICD-10-CM | POA: Diagnosis not present

## 2022-10-26 ENCOUNTER — Other Ambulatory Visit (HOSPITAL_COMMUNITY): Payer: Self-pay | Admitting: Medical

## 2022-10-26 DIAGNOSIS — M25511 Pain in right shoulder: Secondary | ICD-10-CM

## 2022-10-26 NOTE — Progress Notes (Signed)
Dr. Roderic Palau reviewed pt's chart and pulmonary fibrosis diagnosis.  Dr. Ola Spurr recommended that pt be moved to the main OR for surgery.  RN contacted Abigail Butts, Dr. Josetta Huddle scheduler, and notified her that the pt needs to be moved to the main OR for breast surgery.  Abigail Butts verbalized understandings.

## 2022-10-29 ENCOUNTER — Telehealth: Payer: Medicare HMO | Admitting: Internal Medicine

## 2022-10-29 NOTE — Pre-Procedure Instructions (Signed)
Surgical Instructions    Your procedure is scheduled on Wednesday December 6.  Report to [facility] Main Entrance "A" at 11:45 A.M., then check in with the Admitting office.  Call this number if you have problems the morning of surgery:  [phone]  If you have any questions prior to your surgery date call [phone]: Open Monday-Friday 8am-4pm  If you experience any cold, Covid, or flu symptoms such as cough, fever, chills, shortness of breath, etc. between now and your scheduled surgery, please notify us at the above number   Patient Instructions   The night before surgery:    No food after midnight. ONLY clear liquids after midnight   The day of surgery:    You may drink clear liquids until 10:45 the morning of your surgery.   Clear liquids allowed are: Water, Non-Citrus Juices (without pulp), Carbonated Beverages, Clear Tea, Black Coffee ONLY (NO MILK, CREAM OR POWDERED CREAMER of any kind), and Gatorade          If you have questions, please contact your surgeon's office.    Take these medications the morning of surgery with A SIP OF WATER:  amLODipine (NORVASC)  ipratropium (ATROVENT)  pantoprazole (PROTONIX)  predniSONE (DELTASONE)  hydroxychloroquine (PLAQUENIL)   You may take these medications with A SIP OF WATER IF YOU NEED THEM: acetaminophen (TYLENOL)  albuterol (VENTOLIN HFA)    Please bring all inhalers with you the day of surgery.   Follow your surgeon's instructions on when to stop Aspirin.  If no instructions were given by your surgeon then you will need to call the office to get those instructions.    As of today, STOP taking any (unless otherwise instructed by your surgeon) Aleve, Naproxen, Ibuprofen, Motrin, Advil, Goody's, BC's, all herbal medications, fish oil, and all vitamins.          Do NOT Smoke (Tobacco/Vaping)  24 hours prior to your procedure  If you use a CPAP at night, you may bring your mask for your overnight stay.   Contacts,  glasses, hearing aids, dentures or partials may not be worn into surgery, please bring cases for these belongings   For patients admitted to the hospital, discharge time will be determined by your treatment team.   Patients discharged the day of surgery will not be allowed to drive home, and someone needs to stay with them for 24 hours.   SURGICAL WAITING ROOM VISITATION Patients having surgery or a procedure may have no more than 2 support people in the waiting area - these visitors may rotate.   Children under the age of 34 must have an adult with them who is not the patient. If the patient needs to stay at the hospital during part of their recovery, the visitor guidelines for inpatient rooms apply. Pre-op nurse will coordinate an appropriate time for 1 support person to accompany patient in pre-op.  This support person may not rotate.   Please refer to the [facility] website for the visitor guidelines for Inpatients (after your surgery is over and you are in a regular room).    Special instructions:    Oral Hygiene is also important to reduce your risk of infection.  Remember -  BRUSH YOUR TEETH THE MORNING OF SURGERY WITH YOUR REGULAR TOOTHPASTE   [place]- Preparing For Surgery  Before surgery, you can play an important role. Because skin is not sterile, your skin needs to be as free of germs as possible. You can reduce the number  of germs on your skin by washing with CHG (chlorahexidine gluconate) Soap before surgery.  CHG is an antiseptic cleaner which kills germs and bonds with the skin to continue killing germs even after washing.     Please do not use if you have an allergy to CHG or antibacterial soaps. If your skin becomes reddened/irritated stop using the CHG.  Do not shave (including legs and underarms) for at least 48 hours prior to first CHG shower. It is OK to shave your face.  Please follow these instructions carefully.    Shower the NIGHT BEFORE SURGERY and the  MORNING OF SURGERY with CHG Soap.  If you chose to wash your hair, wash your hair first as usual with your normal shampoo.  After you shampoo, rinse your hair and body thoroughly to remove the shampoo.   Then [employer] and genitals (private parts) with your normal soap and rinse thoroughly to remove soap.  After that Use CHG Soap as you would any other liquid soap.  You can apply CHG directly to the skin and wash gently with a scrungie or a clean washcloth.   Apply the CHG Soap to your body ONLY FROM THE NECK DOWN.   Do not use on open wounds or open sores.  Avoid contact with your eyes, ears, mouth and genitals (private parts).  Wash thoroughly, paying special attention to the area where your surgery will be performed.  Thoroughly rinse your body with warm water from the neck down.  DO NOT shower/wash with your normal soap after using and rinsing off the CHG Soap.  Pat yourself dry with a CLEAN TOWEL.  Wear CLEAN PAJAMAS to bed the night before surgery  Place CLEAN SHEETS on your bed the night before your surgery  DO NOT SLEEP WITH PETS.   Day of Surgery:  Take a shower with CHG soap. Wear Clean/Comfortable clothing the morning of surgery Brush your teeth WITH YOUR REGULAR TOOTHPASTE. Do not wear jewelry or makeup. Do not wear lotions, powders, perfumes or deodorant. Do not shave 48 hours prior to surgery. Do not bring valuables to the hospital.  [facility] is not responsible for any belongings or valuables.  Do not wear nail polish, gel polish, artificial nails, or any other type of covering on natural nails (fingers and toes) If you have artificial nails or gel coating that need to be removed by a nail salon, please have this removed prior to surgery. Artificial nails or gel coating may interfere with anesthesia's ability to adequately monitor your vital signs.   If you received a COVID test during your pre-op visit, it is requested that you wear a mask when out in  public, stay away from anyone that may not be feeling well, and notify your surgeon if you develop symptoms. If you have been in contact with anyone that has tested positive in the last 10 days, please notify your surgeon.    Please read over the following fact sheets that you were given.

## 2022-10-30 ENCOUNTER — Other Ambulatory Visit: Payer: Self-pay

## 2022-10-30 ENCOUNTER — Encounter (HOSPITAL_COMMUNITY)
Admission: RE | Admit: 2022-10-30 | Discharge: 2022-10-30 | Disposition: A | Discharge status: Home / Self Care | Payer: Medicare HMO | Source: Ambulatory Visit | Attending: Surgery | Admitting: Surgery

## 2022-10-30 ENCOUNTER — Encounter (HOSPITAL_COMMUNITY): Payer: Self-pay

## 2022-10-30 VITALS — BP 137/66 | HR 99 | Temp 98.1°F | Resp 17 | Ht 63.0 in | Wt 194.8 lb

## 2022-10-30 DIAGNOSIS — I272 Pulmonary hypertension, unspecified: Secondary | ICD-10-CM | POA: Diagnosis not present

## 2022-10-30 DIAGNOSIS — Z01818 Encounter for other preprocedural examination: Secondary | ICD-10-CM | POA: Insufficient documentation

## 2022-10-30 HISTORY — PX: BREAST LUMPECTOMY: SHX2

## 2022-10-30 LAB — CBC
HCT: 45.5 % (ref 36.0–46.0)
Hemoglobin: 16 g/dL — ABNORMAL HIGH (ref 12.0–15.0)
MCH: 34 pg (ref 26.0–34.0)
MCHC: 35.2 g/dL (ref 30.0–36.0)
MCV: 96.8 fL (ref 80.0–100.0)
Platelets: 343 10*3/uL (ref 150–400)
RBC: 4.7 MIL/uL (ref 3.87–5.11)
RDW: 12.8 % (ref 11.5–15.5)
WBC: 14.1 10*3/uL — ABNORMAL HIGH (ref 4.0–10.5)
nRBC: 0 % (ref 0.0–0.2)

## 2022-10-30 NOTE — Progress Notes (Signed)
BMP hemolyzed, recollect on day of surgery

## 2022-10-30 NOTE — Progress Notes (Addendum)
PCP - Asencion Noble Cardiologist - Dr. Stanford Breed   PPM/ICD - Denies  Chest x-ray - NI EKG - 10/30/22 Stress Test - 02/12/21 ECHO - 07/14/22 Cardiac Cath - Several years ago 03/15/14  Sleep Study - Denies  DM - Denies  Blood Thinner Instructions: Denies Aspirin Instructions:REquested to verify stop date with surgeon ERAS Protcol -Yes  COVID TEST- NI   Anesthesia review: Yes cardiac history  Patient denies shortness of breath, fever, cough and chest pain at PAT appointment   All instructions explained to the patient, with a verbal understanding of the material. Patient agrees to go over the instructions while at home for a better understanding. The opportunity to ask questions was provided.  Patient had a recent fall and tore her R rotator cuff.

## 2022-11-02 NOTE — Progress Notes (Addendum)
Anesthesia Chart Review:  Pertinent medical history includes interstitial lung disease due to connective tissue disease, pulmonary fibrosis, pulmonary hypertension, Raynaud's disease, chronic diastolic heart failure.  She is followed by pulmonology at [facility].  She was last seen by Geraldo Pitter, NP on 09/28/2022.  Per note, "Patient had overnight oximetry test on 08/27/2022 that showed very mild oxygen desaturation, pulse ox <88% for 5 minutes and 33 seconds. CT chest in October 2023 showed unchanged appearance of lungs, findings considered diagnostic for UIP.  However given stability compared with studies dating back to 2013 alternative diagnoses such as NSIP is of consideration. She is on Plaquenil and prednisone 33m daily for RA, symptoms are well control. She would like to hold off on starting nocturnal oxygen. Recent dx breast cancer, planning for lumpectomy."  No changes were made to management, she is advised to follow-up in 3 months.  Patient evaluated by cardiologist Dr. CStanford BreedMarch 2022 for episode of dizziness/chest tightness.  He ordered nuclear stress test which was low risk, nonischemic.  Recent echo ordered by pulmonology showed EF 60 to 616% grade 1 diastolic dysfunction, no significant valvular abnormalities.  Severe hepatic steatosis noted on chest CT. Most recent CMP 09/30/2022 showed hepatic function to be WNL.  Preop labs reviewed, WBC mildly elevated at 14.1, otherwise unremarkable.  BMP hemolyzed, will need to be redrawn day of surgery.  EKG 10/30/2022: NSR.  Rate 94.  PFTs 07/27/2022: FVC-%Pred-Pre % 55  FEV1-%Pred-Pre % 56  FEV1FVC-%Pred-Pre % 101  DLCO unc % pred % 77   CT chest 08/31/2022: IMPRESSION: 1. The appearance of the lungs is unchanged compared to the prior study, with a spectrum of findings once again considered diagnostic of usual interstitial pneumonia (UIP) per current ATS guidelines. However, given the stability in the appearance of the lungs  compared to remote prior studies dating back to 2013, an alternative diagnosis such as fibrotic phase nonspecific interstitial pneumonia (NSIP) warrants consideration. 2. Aortic atherosclerosis, in addition to three-vessel coronary artery disease. Assessment for potential risk factor modification, dietary therapy or pharmacologic therapy may be warranted, if clinically indicated. 3. There are calcifications of the aortic valve. Echocardiographic correlation for evaluation of potential valvular dysfunction may be warranted if clinically indicated. 4. Cirrhosis with severe hepatic steatosis.  TTE 07/14/2022:  1. Left ventricular ejection fraction, by estimation, is 60 to 65%. The  left ventricle has normal function. The left ventricle has no regional  wall motion abnormalities. Left ventricular diastolic parameters are  consistent with Grade I diastolic  dysfunction (impaired relaxation).   2. Right ventricular systolic function is normal. The right ventricular  size is normal. There is normal pulmonary artery systolic pressure.   3. Left atrial size was mildly dilated.   4. The mitral valve is abnormal. No evidence of mitral valve  regurgitation. No evidence of mitral stenosis.   5. The aortic valve is tricuspid. Aortic valve regurgitation is not  visualized. No aortic stenosis is present.   6. The inferior vena cava is normal in size with greater than 50%  respiratory variability, suggesting right atrial pressure of 3 mmHg.   Nuclear stress 02/12/2021: Nuclear stress EF: 82%. There was no ST segment deviation noted during stress. No T wave inversion was noted during stress. Normal perfusion with no evidence of ischemia or infarction. The study is normal. This is a low risk study. The left ventricular ejection fraction is hyperdynamic (>65%).   JWynonia MustyMSurgery Center Of KansasShort Stay Center/Anesthesiology Phone ([phone]/02/2022 3:03 PM

## 2022-11-02 NOTE — Anesthesia Preprocedure Evaluation (Addendum)
Anesthesia Evaluation  Patient identified by MRN, date of birth, ID band Patient awake    Reviewed: Allergy & Precautions, NPO status , Patient's Chart, lab work & pertinent test results  History of Anesthesia Complications Negative for: history of anesthetic complications  Airway Mallampati: II  TM Distance: >3 FB Neck ROM: Full    Dental  (+) Missing,    Pulmonary former smoker   Pulmonary exam normal        Cardiovascular hypertension, Pt. on medications + Peripheral Vascular Disease and +CHF  Normal cardiovascular exam     Neuro/Psych negative neurological ROS  negative psych ROS   GI/Hepatic Neg liver ROS,GERD  Medicated and Controlled,,  Endo/Other  negative endocrine ROS    Renal/GU negative Renal ROS  negative genitourinary   Musculoskeletal  (+) Arthritis ,    Abdominal   Peds  Hematology negative hematology ROS (+)   Anesthesia Other Findings Day of surgery medications reviewed with patient.  Reproductive/Obstetrics negative OB ROS                             Anesthesia Physical Anesthesia Plan  ASA: 2  Anesthesia Plan: General   Post-op Pain Management: Tylenol PO (pre-op)*   Induction: Intravenous  PONV Risk Score and Plan: 3 and Treatment may vary due to age or medical condition, Ondansetron and Dexamethasone  Airway Management Planned: LMA  Additional Equipment: None  Intra-op Plan:   Post-operative Plan: Extubation in OR  Informed Consent: I have reviewed the patients History and Physical, chart, labs and discussed the procedure including the risks, benefits and alternatives for the proposed anesthesia with the patient or authorized representative who has indicated his/her understanding and acceptance.     Dental advisory given  Plan Discussed with: CRNA  Anesthesia Plan Comments: (PAT note by Antionette Poles, PA-C: Pertinent medical history includes  interstitial lung disease due to connective tissue disease, pulmonary fibrosis, pulmonary hypertension, Raynaud's disease, chronic diastolic heart failure.  She is followed by pulmonology at [facility].  She was last seen by Ames Dura, NP on 09/28/2022.  Per note, "Patient had overnight oximetry test on 08/27/2022 that showed very mild oxygen desaturation, pulse ox<88%for 5 minutes and 33 seconds. CT chest in October 2023showed unchanged appearance of lungs,findings considered diagnostic for UIP. However given stability compared with studies dating back to 2013 alternative diagnoses such as NSIP is of consideration. She is on Plaquenil and prednisone 5mg  daily for RA, symptoms are well control.She would like to hold off on starting nocturnal oxygen.Recent dx breast cancer,planning forlumpectomy."  No changes were made to management, she is advised to follow-up in 3 months.  Patient evaluated by cardiologist Dr. Jens Som March 2022 for episode of dizziness/chest tightness.  He ordered nuclear stress test which was low risk, nonischemic.  Recent echo ordered by pulmonology showed EF 60 to 65%, grade 1 diastolic dysfunction, no significant valvular abnormalities.  Severe hepatic steatosis noted on chest CT. Most recent CMP 09/30/2022 showed hepatic function to be WNL.  Preop labs reviewed, Preop labs reviewed, WBC mildly elevated at 14.1, otherwise unremarkable.  BMP hemolyzed, will need to be redrawn day of surgery.  EKG 10/30/2022: NSR.  Rate 94.  PFTs 07/27/2022: FVC-%Pred-Pre % 55 FEV1-%Pred-Pre % 56 FEV1FVC-%Pred-Pre % 101 DLCO unc % pred % 77  CT chest 08/31/2022: IMPRESSION: 1. The appearance of the lungs is unchanged compared to the prior study, with a spectrum of findings once again considered diagnostic of  usual interstitial pneumonia (UIP) per current ATS guidelines. However, given the stability in the appearance of the lungs compared to remote prior studies dating back to 2013,  an alternative diagnosis such as fibrotic phase nonspecific interstitial pneumonia (NSIP) warrants consideration. 2. Aortic atherosclerosis, in addition to three-vessel coronary artery disease. Assessment for potential risk factor modification, dietary therapy or pharmacologic therapy may be warranted, if clinically indicated. 3. There are calcifications of the aortic valve. Echocardiographic correlation for evaluation of potential valvular dysfunction may be warranted if clinically indicated. 4. Cirrhosis with severe hepatic steatosis.  TTE 07/14/2022: 1. Left ventricular ejection fraction, by estimation, is 60 to 65%. The  left ventricle has normal function. The left ventricle has no regional  wall motion abnormalities. Left ventricular diastolic parameters are  consistent with Grade I diastolic  dysfunction (impaired relaxation).  2. Right ventricular systolic function is normal. The right ventricular  size is normal. There is normal pulmonary artery systolic pressure.  3. Left atrial size was mildly dilated.  4. The mitral valve is abnormal. No evidence of mitral valve  regurgitation. No evidence of mitral stenosis.  5. The aortic valve is tricuspid. Aortic valve regurgitation is not  visualized. No aortic stenosis is present.  6. The inferior vena cava is normal in size with greater than 50%  respiratory variability, suggesting right atrial pressure of 3 mmHg.   Nuclear stress 02/12/2021: ? Nuclear stress EF: 82%. ? There was no ST segment deviation noted during stress. ? No T wave inversion was noted during stress. ? Normal perfusion with no evidence of ischemia or infarction. ? The study is normal. ? This is a low risk study. ? The left ventricular ejection fraction is hyperdynamic (>65%).  )        Anesthesia Quick Evaluation

## 2022-11-03 ENCOUNTER — Ambulatory Visit
Admission: RE | Admit: 2022-11-03 | Discharge: 2022-11-03 | Disposition: A | Discharge status: Home / Self Care | Payer: Medicare HMO | Source: Ambulatory Visit | Attending: Surgery | Admitting: Surgery

## 2022-11-03 ENCOUNTER — Encounter: Payer: Self-pay | Admitting: *Deleted

## 2022-11-03 DIAGNOSIS — C50412 Malignant neoplasm of upper-outer quadrant of left female breast: Secondary | ICD-10-CM | POA: Diagnosis not present

## 2022-11-03 DIAGNOSIS — C50912 Malignant neoplasm of unspecified site of left female breast: Secondary | ICD-10-CM

## 2022-11-03 HISTORY — PX: BREAST BIOPSY: SHX20

## 2022-11-04 ENCOUNTER — Ambulatory Visit (HOSPITAL_BASED_OUTPATIENT_CLINIC_OR_DEPARTMENT_OTHER): Payer: Medicare HMO | Admitting: Physician Assistant

## 2022-11-04 ENCOUNTER — Encounter (HOSPITAL_COMMUNITY)
Admission: RE | Disposition: A | Discharge status: Home / Self Care | Payer: Self-pay | Source: Home / Self Care | Attending: Surgery

## 2022-11-04 ENCOUNTER — Ambulatory Visit
Admission: RE | Admit: 2022-11-04 | Discharge: 2022-11-04 | Disposition: A | Discharge status: Home / Self Care | Payer: Medicare HMO | Source: Ambulatory Visit | Attending: Surgery | Admitting: Surgery

## 2022-11-04 ENCOUNTER — Encounter (HOSPITAL_COMMUNITY): Payer: Self-pay | Admitting: Surgery

## 2022-11-04 ENCOUNTER — Other Ambulatory Visit: Payer: Self-pay

## 2022-11-04 ENCOUNTER — Ambulatory Visit (HOSPITAL_COMMUNITY): Payer: Medicare HMO | Admitting: Physician Assistant

## 2022-11-04 ENCOUNTER — Ambulatory Visit (HOSPITAL_COMMUNITY)
Admission: RE | Admit: 2022-11-04 | Discharge: 2022-11-04 | Disposition: A | Discharge status: Home / Self Care | Payer: Medicare HMO | Attending: Surgery | Admitting: Surgery

## 2022-11-04 DIAGNOSIS — I509 Heart failure, unspecified: Secondary | ICD-10-CM | POA: Diagnosis not present

## 2022-11-04 DIAGNOSIS — I739 Peripheral vascular disease, unspecified: Secondary | ICD-10-CM | POA: Insufficient documentation

## 2022-11-04 DIAGNOSIS — C50412 Malignant neoplasm of upper-outer quadrant of left female breast: Secondary | ICD-10-CM | POA: Diagnosis not present

## 2022-11-04 DIAGNOSIS — K219 Gastro-esophageal reflux disease without esophagitis: Secondary | ICD-10-CM | POA: Insufficient documentation

## 2022-11-04 DIAGNOSIS — I110 Hypertensive heart disease with heart failure: Secondary | ICD-10-CM | POA: Diagnosis not present

## 2022-11-04 DIAGNOSIS — Z87891 Personal history of nicotine dependence: Secondary | ICD-10-CM | POA: Diagnosis not present

## 2022-11-04 DIAGNOSIS — C50912 Malignant neoplasm of unspecified site of left female breast: Secondary | ICD-10-CM | POA: Diagnosis not present

## 2022-11-04 DIAGNOSIS — R928 Other abnormal and inconclusive findings on diagnostic imaging of breast: Secondary | ICD-10-CM | POA: Diagnosis not present

## 2022-11-04 DIAGNOSIS — Z79899 Other long term (current) drug therapy: Secondary | ICD-10-CM | POA: Insufficient documentation

## 2022-11-04 DIAGNOSIS — M1990 Unspecified osteoarthritis, unspecified site: Secondary | ICD-10-CM | POA: Diagnosis not present

## 2022-11-04 DIAGNOSIS — Z170 Estrogen receptor positive status [ER+]: Secondary | ICD-10-CM | POA: Diagnosis not present

## 2022-11-04 HISTORY — PX: BREAST LUMPECTOMY WITH RADIOACTIVE SEED LOCALIZATION: SHX6424

## 2022-11-04 HISTORY — DX: Fatty (change of) liver, not elsewhere classified: K76.0

## 2022-11-04 LAB — COMPREHENSIVE METABOLIC PANEL
ALT: 25 U/L (ref 0–44)
AST: 39 U/L (ref 15–41)
Albumin: 3.9 g/dL (ref 3.5–5.0)
Alkaline Phosphatase: 93 U/L (ref 38–126)
Anion gap: 11 (ref 5–15)
BUN: 12 mg/dL (ref 8–23)
CO2: 28 mmol/L (ref 22–32)
Calcium: 8.8 mg/dL — ABNORMAL LOW (ref 8.9–10.3)
Chloride: 100 mmol/L (ref 98–111)
Creatinine, Ser: 0.85 mg/dL (ref 0.44–1.00)
GFR, Estimated: >60 mL/min (ref >60)
Glucose, Bld: 129 mg/dL — ABNORMAL HIGH (ref 70–99)
Potassium: 3.5 mmol/L (ref 3.5–5.1)
Sodium: 139 mmol/L (ref 135–145)
Total Bilirubin: 1.5 mg/dL — ABNORMAL HIGH (ref 0.3–1.2)
Total Protein: 6.7 g/dL (ref 6.5–8.1)

## 2022-11-04 SURGERY — BREAST LUMPECTOMY WITH RADIOACTIVE SEED LOCALIZATION
Anesthesia: General | Site: Breast | Laterality: Left

## 2022-11-04 MED ORDER — CHLORHEXIDINE GLUCONATE 0.12 % MT SOLN
15.0000 mL | Freq: Once | OROMUCOSAL | Status: AC
  Administered 2022-11-04: 15 mL via OROMUCOSAL
  Filled 2022-11-04: qty 15

## 2022-11-04 MED ORDER — OXYCODONE HCL 5 MG/5ML PO SOLN: 5.0000 mg | Freq: Once | ORAL | Status: DC | PRN

## 2022-11-04 MED ORDER — FENTANYL CITRATE (PF) 100 MCG/2ML IJ SOLN
INTRAMUSCULAR | Status: AC
  Filled 2022-11-04: qty 2

## 2022-11-04 MED ORDER — SUGAMMADEX SODIUM 200 MG/2ML IV SOLN
INTRAVENOUS | Status: DC | PRN
  Administered 2022-11-04: 400 mg via INTRAVENOUS

## 2022-11-04 MED ORDER — CHLORHEXIDINE GLUCONATE CLOTH 2 % EX PADS: 6.0000 | MEDICATED_PAD | Freq: Once | CUTANEOUS | Status: DC

## 2022-11-04 MED ORDER — ONDANSETRON HCL 4 MG/2ML IJ SOLN
INTRAMUSCULAR | Status: DC | PRN
  Administered 2022-11-04: 4 mg via INTRAVENOUS

## 2022-11-04 MED ORDER — FENTANYL CITRATE (PF) 100 MCG/2ML IJ SOLN: 25.0000 ug | INTRAMUSCULAR | Status: DC | PRN

## 2022-11-04 MED ORDER — DEXAMETHASONE SODIUM PHOSPHATE 10 MG/ML IJ SOLN
INTRAMUSCULAR | Status: DC | PRN
  Administered 2022-11-04: 10 mg via INTRAVENOUS

## 2022-11-04 MED ORDER — CEFAZOLIN SODIUM-DEXTROSE 2-4 GM/100ML-% IV SOLN
2.0000 g | INTRAVENOUS | Status: AC
  Administered 2022-11-04: 2 g via INTRAVENOUS
  Filled 2022-11-04: qty 100

## 2022-11-04 MED ORDER — LIDOCAINE 2% (20 MG/ML) 5 ML SYRINGE
INTRAMUSCULAR | Status: DC | PRN
  Administered 2022-11-04: 100 mg via INTRAVENOUS

## 2022-11-04 MED ORDER — PROPOFOL 10 MG/ML IV BOLUS
INTRAVENOUS | Status: DC | PRN
  Administered 2022-11-04: 130 mg via INTRAVENOUS
  Administered 2022-11-04: 100 mg via INTRAVENOUS

## 2022-11-04 MED ORDER — ORAL CARE MOUTH RINSE: 15.0000 mL | Freq: Once | OROMUCOSAL | Status: AC

## 2022-11-04 MED ORDER — ROCURONIUM BROMIDE 10 MG/ML (PF) SYRINGE
PREFILLED_SYRINGE | INTRAVENOUS | Status: DC | PRN
  Administered 2022-11-04: 35 mg via INTRAVENOUS

## 2022-11-04 MED ORDER — ACETAMINOPHEN 500 MG PO TABS
1000.0000 mg | ORAL_TABLET | Freq: Once | ORAL | Status: AC
  Administered 2022-11-04: 1000 mg via ORAL
  Filled 2022-11-04: qty 2

## 2022-11-04 MED ORDER — BUPIVACAINE-EPINEPHRINE (PF) 0.25% -1:200000 IJ SOLN
INTRAMUSCULAR | Status: AC
  Filled 2022-11-04: qty 30

## 2022-11-04 MED ORDER — OXYCODONE HCL 5 MG PO TABS: 5.0000 mg | ORAL_TABLET | Freq: Four times a day (QID) | ORAL | 0 refills | Status: DC | PRN

## 2022-11-04 MED ORDER — PROPOFOL 10 MG/ML IV BOLUS
INTRAVENOUS | Status: AC
  Filled 2022-11-04: qty 20

## 2022-11-04 MED ORDER — FENTANYL CITRATE (PF) 100 MCG/2ML IJ SOLN
INTRAMUSCULAR | Status: DC | PRN
  Administered 2022-11-04: 100 ug via INTRAVENOUS

## 2022-11-04 MED ORDER — LIDOCAINE 2% (20 MG/ML) 5 ML SYRINGE
INTRAMUSCULAR | Status: AC
  Filled 2022-11-04: qty 5

## 2022-11-04 MED ORDER — GABAPENTIN 300 MG PO CAPS
300.0000 mg | ORAL_CAPSULE | ORAL | Status: AC
  Administered 2022-11-04: 300 mg via ORAL
  Filled 2022-11-04: qty 1

## 2022-11-04 MED ORDER — OXYCODONE HCL 5 MG PO TABS: 5.0000 mg | ORAL_TABLET | Freq: Once | ORAL | Status: DC | PRN

## 2022-11-04 MED ORDER — LACTATED RINGERS IV SOLN: INTRAVENOUS | Status: DC

## 2022-11-04 MED ORDER — BUPIVACAINE-EPINEPHRINE 0.25% -1:200000 IJ SOLN
INTRAMUSCULAR | Status: DC | PRN
  Administered 2022-11-04: 30 mL

## 2022-11-04 MED ORDER — 0.9 % SODIUM CHLORIDE (POUR BTL) OPTIME
TOPICAL | Status: DC | PRN
  Administered 2022-11-04: 1000 mL

## 2022-11-04 MED ORDER — ROCURONIUM BROMIDE 10 MG/ML (PF) SYRINGE
PREFILLED_SYRINGE | INTRAVENOUS | Status: AC
  Filled 2022-11-04: qty 10

## 2022-11-04 SURGICAL SUPPLY — 31 items
APPLIER CLIP 11 MED OPEN (CLIP) ×1
APPLIER CLIP 9.375 MED OPEN (MISCELLANEOUS) ×1
BAG COUNTER SPONGE SURGICOUNT (BAG) ×1 IMPLANT
CANISTER SUCT 3000ML PPV (MISCELLANEOUS) IMPLANT
CHLORAPREP W/TINT 26 (MISCELLANEOUS) ×1 IMPLANT
CLIP APPLIE 11 MED OPEN (CLIP) IMPLANT
CLIP APPLIE 9.375 MED OPEN (MISCELLANEOUS) IMPLANT
COVER PROBE W GEL 5X96 (DRAPES) ×1 IMPLANT
COVER SURGICAL LIGHT HANDLE (MISCELLANEOUS) ×1 IMPLANT
DERMABOND ADVANCED .7 DNX12 (GAUZE/BANDAGES/DRESSINGS) ×1 IMPLANT
DEVICE DUBIN SPECIMEN MAMMOGRA (MISCELLANEOUS) ×1 IMPLANT
DRAPE CHEST BREAST 15X10 FENES (DRAPES) ×1 IMPLANT
ELECT CAUTERY BLADE 6.4 (BLADE) ×1 IMPLANT
ELECT REM PT RETURN 9FT ADLT (ELECTROSURGICAL) ×1
ELECTRODE REM PT RTRN 9FT ADLT (ELECTROSURGICAL) ×1 IMPLANT
GLOVE BIO SURGEON STRL SZ8 (GLOVE) ×1 IMPLANT
GLOVE BIOGEL PI IND STRL 8 (GLOVE) ×1 IMPLANT
GOWN STRL REUS W/ TWL LRG LVL3 (GOWN DISPOSABLE) ×1 IMPLANT
GOWN STRL REUS W/ TWL XL LVL3 (GOWN DISPOSABLE) ×1 IMPLANT
GOWN STRL REUS W/TWL LRG LVL3 (GOWN DISPOSABLE) ×1
GOWN STRL REUS W/TWL XL LVL3 (GOWN DISPOSABLE) ×1
KIT BASIN OR (CUSTOM PROCEDURE TRAY) ×1 IMPLANT
KIT MARKER MARGIN INK (KITS) IMPLANT
NDL HYPO 25GX1X1/2 BEV (NEEDLE) IMPLANT
NEEDLE HYPO 25GX1X1/2 BEV (NEEDLE) ×1 IMPLANT
NS IRRIG 1000ML POUR BTL (IV SOLUTION) IMPLANT
PACK GENERAL/GYN (CUSTOM PROCEDURE TRAY) ×1 IMPLANT
SUT MNCRL AB 4-0 PS2 18 (SUTURE) ×1 IMPLANT
SUT SILK 2 0 SH (SUTURE) IMPLANT
SUT VIC AB 3-0 SH 8-18 (SUTURE) ×1 IMPLANT
SYR CONTROL 10ML LL (SYRINGE) IMPLANT

## 2022-11-04 NOTE — Interval H&P Note (Signed)
History and Physical Interval Note:  11/04/2022 1:05 PM  [name]  has presented today for surgery, with the diagnosis of LEFT BREAST CANCER.  The various methods of treatment have been discussed with the patient and family. After consideration of risks, benefits and other options for treatment, the patient has consented to  Procedure(s): LEFT BREAST LUMPECTOMY WITH RADIOACTIVE SEED LOCALIZATION (Left) as a surgical intervention.  The patient's history has been reviewed, patient examined, no change in status, stable for surgery.  I have reviewed the patient's chart and labs.  Questions were answered to the patient's satisfaction.    The procedure has been discussed with the patient. Alternatives to surgery have been discussed with the patient.  Risks of surgery include bleeding,  Infection,  Seroma formation, death,  and the need for further surgery.   The patient understands and wishes to proceed.  Turner Daniels MD

## 2022-11-04 NOTE — Anesthesia Procedure Notes (Signed)
Procedure Name: Intubation Date/Time: 11/04/2022 1:35 AM  Performed by: Lind Guest, CRNAPre-anesthesia Checklist: Patient identified, Emergency Drugs available, Suction available, Patient being monitored and Timeout performed Patient Re-evaluated:Patient Re-evaluated prior to induction Oxygen Delivery Method: Circle system utilized Preoxygenation: Pre-oxygenation with 100% oxygen Induction Type: IV induction Ventilation: Mask ventilation without difficulty Laryngoscope Size: Mac and 3 Grade View: Grade I Tube type: Oral Rae Tube size: 7.0 mm Number of attempts: 1 Placement Confirmation: ETT inserted through vocal cords under direct vision, positive ETCO2 and breath sounds checked- equal and bilateral Secured at: 22 cm Tube secured with: Tape Dental Injury: Teeth and Oropharynx as per pre-operative assessment

## 2022-11-04 NOTE — H&P (Signed)
History of Present Illness: [name] is a 76 y.o. female who is seen today as an office consultation for evaluation of Breast Cancer .   Patient seen today in the [facility] for abnormal left breast mammogram. She has a 5 mm mass left breast lower outer quadrant core BX proven invasive ductal carcinoma with mucinous features ER positive, PR positive HER2/neu negative. Patient has multiple other medical problems including scleroderma. She has no other complaints today.  Review of Systems: A complete review of systems was obtained from the patient. I have reviewed this information and discussed as appropriate with the patient. See HPI as well for other ROS.    Medical History: Past Medical History:  Diagnosis Date  Arthritis  CHF (congestive heart failure) (CMS-HCC)  GERD (gastroesophageal reflux disease)  Hypertension  Osteopenia  Dexa 09/2010  Raynaud disease  Rheumatoid arthritis(714.0) (CMS-HCC)  Scleroderma (CMS-HCC)   Patient Active Problem List  Diagnosis  Diffuse Parenchymal Lung Disease - likely NSIP related to underlying scleroderma  Systemic sclerosis - SCL70+, Raynaud's, telangiectasias, esophageal dysmotility  Osteoarthritis of both knees  Osteopenia  Rheumatoid arthritis(714.0) (CMS-HCC)   Past Surgical History:  Procedure Laterality Date  ARTHROPLASTY TOTAL KNEE Right 10/05/2006  ARTHROPLASTY TOTAL KNEE Left 02/11/2015  BLADDER SUSPENSION  CARPAL TUNNEL RELEASE Right  CHOLECYSTECTOMY  HYSTERECTOMY  LAPAROSCOPIC CHOLECYSTECTOMY    Allergies  Allergen Reactions  Codeine Other (See Comments)  HIGH SENSITIVITY   Current Outpatient Medications on File Prior to Visit  Medication Sig Dispense Refill  albuterol 90 mcg/actuation inhaler Inhale into the lungs  benzonatate (TESSALON) 200 MG capsule Take 100 mg by mouth once daily as needed  FUROsemide (LASIX) 40 MG tablet Take 40 mg by mouth once daily  hyoscyamine (LEVSIN) 0.125 mg tablet Take 1 tablet by  mouth once daily  rosuvastatin (CRESTOR) 40 MG tablet  adalimumab (HUMIRA) 40 mg/0.8 mL prefilled syringe kit Inject 0.8 mLs (40 mg total) subcutaneously every 14 (fourteen) days. 3 kit 3  albuterol 90 mcg/actuation inhaler Inhale into the lungs. Inhale 2 puffs into the lungs every 6 (six) hours as needed.  amLODIPine (NORVASC) 5 MG tablet Take 2.5 mg by mouth once daily  aspirin 81 MG chewable tablet Take 81 mg by mouth once daily  aspirin 81 MG EC tablet Take 81 mg by mouth daily.  azelastine (ASTELIN) 137 mcg nasal spray Place 2 sprays into both nostrils 2 (two) times daily as needed. 30 mL 12  biotin 5 mg Cap Take 5 mg by mouth daily.  clopidogrel (PLAVIX) 300 mg tablet Take by mouth. Take 300 mg by mouth daily.  fexofenadine-pseudoephedrine (ALLEGRA-D 12H) 60-120 mg ER tablet Take 1 tablet by mouth daily as needed. 60 tablet 11  flaxseed Powd Take by mouth once daily.  FUROsemide (LASIX) 40 MG tablet Take 1 tablet by mouth 2 (two) times daily.  hydroxychloroquine (PLAQUENIL) 200 mg tablet Take 200 mg by mouth once daily  losartan (COZAAR) 100 MG tablet Take 100 mg by mouth daily.  multivitamin capsule Take 1 capsule by mouth daily.  pantoprazole (PROTONIX) 40 MG DR tablet Take 1 tablet by mouth 2 (two) times daily.  sertraline (ZOLOFT) 50 MG tablet  triamcinolone (NASACORT AQ) 55 mcg nasal spray Place into one nostril  UNABLE TO FIND Use. by Does not apply route daily. *Rice protein powder  zolpidem (AMBIEN) 10 mg tablet Take 5 mg by mouth nightly as needed.  zolpidem (AMBIEN) 10 mg tablet Take 1 tablet by mouth at bedtime as needed  No current facility-administered medications on file prior to visit.   Family History  Problem Relation Age of Onset  Coronary Artery Disease (Blocked arteries around heart) Mother  Obesity Mother  High blood pressure (Hypertension) Mother  Diabetes Mother  Dementia Mother  Stroke Mother  Arthritis Father  Coronary Artery Disease (Blocked arteries  around heart) Father  Diabetes Sister  Dementia Brother    Social History   Tobacco Use  Smoking Status Former  Packs/day: 1.00  Years: 20.00  Additional pack years: 0.00  Total pack years: 20.00  Types: Cigarettes  Quit date: 09/29/1996  Years since quitting: 26.0  Smokeless Tobacco Former  Quit date: 10/01/1996    Social History   Socioeconomic History  Marital status: Unknown  Tobacco Use  Smoking status: Former  Packs/day: 1.00  Years: 20.00  Additional pack years: 0.00  Total pack years: 20.00  Types: Cigarettes  Quit date: 09/29/1996  Years since quitting: 26.0  Smokeless tobacco: Former  Quit date: 10/01/1996  Substance and Sexual Activity  Alcohol use: Not Currently  Alcohol/week: 1.0 standard drink of alcohol  Types: 1 Glasses of wine per week  Drug use: No  Social History Narrative  She is married and lives with her husband. She retired as a Marine scientist. She denies alcohol or illicit drug use.   Objective:  There were no vitals filed for this visit.  There is no height or weight on file to calculate BMI.  Physical Exam HENT:  Head: Normocephalic.  Eyes:  Pupils: Pupils are equal, round, and reactive to light.  Cardiovascular:  Rate and Rhythm: Normal rate.  Chest:  Breasts: Right: Normal. No inverted nipple, mass or nipple discharge.  Left: Normal. No inverted nipple, mass or nipple discharge.  Musculoskeletal:  General: Normal range of motion.  Cervical back: Normal range of motion.  Skin: General: Skin is warm.  Neurological:  General: No focal deficit present.  Mental Status: She is alert.  Psychiatric:  Mood and Affect: Mood normal.     Labs, Imaging and Diagnostic Testing:  ADDITIONAL INFORMATION: PROGNOSTIC INDICATORS Results: IMMUNOHISTOCHEMICAL AND MORPHOMETRIC ANALYSIS PERFORMED MANUALLY The tumor cells are NEGATIVE for Her2 (1+). Estrogen Receptor: 100%, POSITIVE, STRONG STAINING INTENSITY Progesterone Receptor: 99%, POSITIVE,  STRONG STAINING INTENSITY Proliferation Marker Ki67: 5% REFERENCE RANGE ESTROGEN RECEPTOR NEGATIVE 0% POSITIVE =>1% REFERENCE RANGE PROGESTERONE RECEPTOR NEGATIVE 0% POSITIVE =>1% All controls stained appropriately Tobin Chad MD Pathologist, Electronic Signature ( Signed 09/23/2022) FINAL DIAGNOSIS Diagnosis Breast, left, needle core biopsy, 2:00 uoq, ribbon shaped clip - INVASIVE DUCTAL CARCINOMA WITH FOCAL EXTRACELLULAR MUCIN, SEE NOTE - DUCTAL CARCINOMA IN SITU LOW-GRADE (1) - TUBULE FORMATION: SCORE 3 - NUCLEAR PLEOMORPHISM: SCORE 1 - MITOTIC COUNT: SCORE 1 1 of 3 FINAL for QUINTARA, BOST ([id number]) Diagnosis(continued) - TOTAL SCORE: 5 - OVERALL GRADE: 1 - LYMPHOVASCULAR INVASION: NOT IDENTIFIED - CANCER LENGTH: 0.5 CM - CALCIFICATIONS: NOT IDENTIFIED Diagnosis Note Dr. Alric Seton reviewed the case and concurs with the interpretation. Immunohistochemical staining for myoepithelial markers (calponin, p63, smooth muscle myosin) was performed and shows loss of staining within the invasive foci. Breast prognostic profile (ER, PR, Ki-67 and HER2) is pending and will be reported in an addendum. The [place] [facility] was notified on 09/21/2022. Maretta Bees M.D. Pathologist, Electronic Signature (Case signed 09/22/2022 CLINICAL DATA: Patient was recalled from screening mammogram for a possible left breast mass.  EXAM: DIGITAL DIAGNOSTIC UNILATERAL LEFT MAMMOGRAM WITH TOMOSYNTHESIS; ULTRASOUND LEFT BREAST LIMITED  TECHNIQUE: Left digital diagnostic mammography and breast  tomosynthesis was performed.; Targeted ultrasound examination of the left breast was performed.  COMPARISON: Previous exam(s).  ACR Breast Density Category b: There are scattered areas of fibroglandular density.  FINDINGS: Additional imaging of the left breast was performed. There is persistence of a 5 mm spiculated mass in the upper-outer quadrant of the  breast. There are no malignant type microcalcifications.  On physical exam, I do not palpate a mass in the upper-outer quadrant of the left breast.  Targeted ultrasound is performed, showing a spiculated hypoechoic mass in the left breast at 2 o'clock 2 cm from the nipple measuring 4 x 5 x 4 mm. Sonographic evaluation of the left axilla does not show any enlarged adenopathy.  IMPRESSION: Suspicious 5 mm mass in the 2 o'clock region of the left breast 2 cm from the nipple.  RECOMMENDATION: Ultrasound-guided core biopsy of the mass in the 2 o'clock region of the left breast 2 cm from the nipple is recommended. The biopsy will be scheduled at the patient's convenience.  I have discussed the findings and recommendations with the patient. If applicable, a reminder letter will be sent to the patient regarding the next appointment.  BI-RADS CATEGORY 4: Suspicious.   Electronically Signed By: Lillia Mountain M.D. On: 09/14/2022 14:23    Assessment and Plan:   Diagnoses and all orders for this visit:  Breast cancer, stage 1, estrogen receptor positive, left     Discussed breast conserving surgery. She does have scleroderma and rheumatoid arthritis. She is not a suitable candidate for radiation therapy but given the mucin nature and low-grade, I think lumpectomy with wide margins would be beneficial. Discussed sentinel lymph node mapping in her case and given her advanced age and low tumor grade, I think that can be admitted. This would hopefully alleviate her wound morbidity given her multiple sclerosis. Risk of surgery include bleeding, infection, recurrence, cosmetic deformity, the need further treatments and/or procedures.  No follow-ups on file.  Kennieth Francois, MD

## 2022-11-04 NOTE — Discharge Instructions (Signed)
[facility] °Office Phone Number [phone] ° °BREAST BIOPSY/ PARTIAL MASTECTOMY: POST OP INSTRUCTIONS ° °Always review your discharge instruction sheet given to you by the facility where your surgery was performed. ° °IF YOU HAVE DISABILITY OR FAMILY LEAVE FORMS, YOU MUST BRING THEM TO THE OFFICE FOR PROCESSING.  DO NOT GIVE THEM TO YOUR DOCTOR. ° °A prescription for pain medication may be given to you upon discharge.  Take your pain medication as prescribed, if needed.  If narcotic pain medicine is not needed, then you may take acetaminophen (Tylenol) or ibuprofen (Advil) as needed. °Take your usually prescribed medications unless otherwise directed °If you need a refill on your pain medication, please contact your pharmacy.  They will contact our office to request authorization.  Prescriptions will not be filled after 5pm or on week-ends. °You should eat very light the first 24 hours after surgery, such as soup, crackers, pudding, etc.  Resume your normal diet the day after surgery. °Most patients will experience some swelling and bruising in the breast.  Ice packs and a good support bra will help.  Swelling and bruising can take several days to resolve.  °It is common to experience some constipation if taking pain medication after surgery.  Increasing fluid intake and taking a stool softener will usually help or prevent this problem from occurring.  A mild laxative (Milk of Magnesia or Miralax) should be taken according to package directions if there are no bowel movements after 48 hours. °Unless discharge instructions indicate otherwise, you may remove your bandages 24-48 hours after surgery, and you may shower at that time.  You may have steri-strips (small skin tapes) in place directly over the incision.  These strips should be left on the skin for 7-10 days.  If your surgeon used skin glue on the incision, you may shower in 24 hours.  The glue will flake off over the next 2-3 weeks.  Any  sutures or staples will be removed at the office during your follow-up visit. °ACTIVITIES:  You may resume regular daily activities (gradually increasing) beginning the next day.  Wearing a good support bra or sports bra minimizes pain and swelling.  You may have sexual intercourse when it is comfortable. °You may drive when you no longer are taking prescription pain medication, you can comfortably wear a seatbelt, and you can safely maneuver your car and apply brakes. °RETURN TO WORK:  ______________________________________________________________________________________ °You should see your doctor in the office for a follow-up appointment approximately two weeks after your surgery.  Your doctor’s nurse will typically make your follow-up appointment when she calls you with your pathology report.  Expect your pathology report 2-3 business days after your surgery.  You may call to check if you do not hear from us after three days. °OTHER INSTRUCTIONS: _______________________________________________________________________________________________ _____________________________________________________________________________________________________________________________________ °_____________________________________________________________________________________________________________________________________ °_____________________________________________________________________________________________________________________________________ ° °WHEN TO CALL YOUR DOCTOR: °Fever over 101.0 °Nausea and/or vomiting. °Extreme swelling or bruising. °Continued bleeding from incision. °Increased pain, redness, or drainage from the incision. ° °The clinic staff is available to answer your questions during regular business hours.  Please don’t hesitate to call and ask to speak to one of the nurses for clinical concerns.  If you have a medical emergency, go to the nearest emergency room or call 911.  A surgeon from [facility] is always on call at the hospital. ° °For further questions, please visit [email or web address]  ° °

## 2022-11-04 NOTE — Transfer of Care (Signed)
Immediate Anesthesia Transfer of Care Note  Patient: [name]  Procedure(s) Performed: LEFT BREAST LUMPECTOMY WITH RADIOACTIVE SEED LOCALIZATION (Left: Breast)  Patient Location: PACU  Anesthesia Type:General  Level of Consciousness: drowsy and patient cooperative  Airway & Oxygen Therapy: Patient Spontanous Breathing and Patient connected to nasal cannula oxygen  Post-op Assessment: Report given to RN and Post -op Vital signs reviewed and stable  Post vital signs: Reviewed and stable  Last Vitals:  Vitals Value Taken Time  BP 156/62 11/04/22 1436  Temp    Pulse 85 11/04/22 1438  Resp 23 11/04/22 1438  SpO2 94 % 11/04/22 1438  Vitals shown include unvalidated device data.  Last Pain:  Vitals:   11/04/22 1217  TempSrc:   PainSc: 0-No pain      Patients Stated Pain Goal: 3 (25/36/64 4034)  Complications: No notable events documented.

## 2022-11-04 NOTE — Op Note (Signed)
Preoperative diagnosis: Left breast cancer stage I upper outer quadrant  Postoperative diagnosis: Same  Procedure: Left breast seed localized lumpectomy  Surgeon: Erroll Luna, MD         Assistant [facility]    Anesthesia: LMA with 0.25% Marcaine with epinephrine  EBL: Minimal  Specimen: Left breast tissue seed and clip verified by Faxitron  Drains: None  Indications for procedure: The patient is a 76 year old female stage I left breast cancer.  She opted for left breast lumpectomy alone after reviewing all of her options of breast conserving surgery, mastectomy reconstruction and the role of sentinel lymph node mapping in her circumstance.  Given small tumor size and low-grade, we felt we could safely omit sentinel lymph node mapping and discussed this with the patient in front.The procedure has been discussed with the patient. Alternatives to surgery have been discussed with the patient.  Risks of surgery include bleeding,  Infection,  Seroma formation, death,  and the need for further surgery.   The patient understands and wishes to proceed.    Description of procedure: The patient was met in the holding area and questions were answered.  Of note she had a seed placed in her left breast.  Of note the seed migrated approximately 1 cm from the clip and this was noted on her mammogram.  This was also discussed preoperatively with the radiologist.  All questions were answered.  Left breast was marked as the correct site.  She was then taken back to the operating room.  She is placed supine upon the OR table.  After induction of general esthesia, left breast was prepped and draped in a sterile fashion timeout performed.  Proper patient, site and procedure verified.  Neoprobe was used to identify the seed in the left breast upper quadrant.  An incision was made over the signal.  Dissection was carried down and the dissection was taken toward the medial breast to encompass the clip.  All  tissue around the seed and clip were excised with with what appeared to be grossly negative margins.  The Faxitron image revealed the seed and clip to be present.  Irrigation was used.  Hemostasis achieved with cautery.  Local anesthetic infiltrated throughout.  The deep layers were then closed using a 3-0 Vicryl.  4 Monocryl was used to close the skin in a subcuticular fashion.  Dermabond was applied.  All counts found to be correct.  Breast binder placed.  The patient was awoke extubated taken recovery in satisfactory condition.

## 2022-11-04 NOTE — Anesthesia Postprocedure Evaluation (Signed)
Anesthesia Post Note  Patient: [name]  Procedure(s) Performed: LEFT BREAST LUMPECTOMY WITH RADIOACTIVE SEED LOCALIZATION (Left: Breast)     Patient location during evaluation: PACU Anesthesia Type: General Level of consciousness: awake and alert Pain management: pain level controlled Vital Signs Assessment: post-procedure vital signs reviewed and stable Respiratory status: spontaneous breathing, nonlabored ventilation and respiratory function stable Cardiovascular status: blood pressure returned to baseline Postop Assessment: no apparent nausea or vomiting Anesthetic complications: no   No notable events documented.  Last Vitals:  Vitals:   11/04/22 1500 11/04/22 1515  BP: (!) 165/64 (!) 137/53  Pulse: 84 84  Resp: (!) 21 15  Temp:  36.6 C  SpO2: 95% 93%    Last Pain:  Vitals:   11/04/22 1515  TempSrc:   PainSc: 0-No pain                 Marthenia Rolling

## 2022-11-05 ENCOUNTER — Encounter (HOSPITAL_COMMUNITY): Payer: Self-pay | Admitting: Surgery

## 2022-11-06 LAB — SURGICAL PATHOLOGY

## 2022-11-07 ENCOUNTER — Encounter: Payer: Self-pay | Admitting: Surgery

## 2022-11-09 ENCOUNTER — Ambulatory Visit: Payer: Medicare HMO | Admitting: Hematology and Oncology

## 2022-11-10 ENCOUNTER — Encounter: Payer: Self-pay | Admitting: *Deleted

## 2022-11-11 ENCOUNTER — Telehealth: Payer: Self-pay | Admitting: *Deleted

## 2022-11-11 NOTE — Telephone Encounter (Signed)
Received call from pt with complaint of tingling sensation in left under arm post recent left lumpectomy.  Pt requesting advice from MD if pt can swap from surgical bra to a regular bra.  Pt educated to contact Dr. Josetta Huddle office, pt verbalized understanding.

## 2022-11-13 ENCOUNTER — Ambulatory Visit (HOSPITAL_COMMUNITY)
Admission: RE | Admit: 2022-11-13 | Discharge: 2022-11-13 | Disposition: A | Discharge status: Home / Self Care | Payer: Medicare HMO | Source: Ambulatory Visit | Attending: Medical | Admitting: Medical

## 2022-11-13 DIAGNOSIS — M25511 Pain in right shoulder: Secondary | ICD-10-CM | POA: Diagnosis not present

## 2022-11-18 NOTE — Progress Notes (Incomplete)
Patient Care Team: Asencion Noble, MD as PCP - General (Internal Medicine) Stanford Breed Denice Bors, MD as PCP - Cardiology (Cardiology) Elsie Stain, MD as Attending Physician (Pulmonary Disease) Rockwell Germany, RN as Oncology Nurse Navigator Mauro Kaufmann, RN as Oncology Nurse Navigator Erroll Luna, MD as Consulting Physician (General Surgery) Nicholas Lose, MD as Consulting Physician (Hematology and Oncology) Kyung Rudd, MD as Consulting Physician (Radiation Oncology)  DIAGNOSIS: No diagnosis found.  SUMMARY OF ONCOLOGIC HISTORY: Oncology History  Malignant neoplasm of upper-outer quadrant of left breast in female, estrogen receptor positive ([place])  09/18/2022 Initial Diagnosis   Screening mammogram detected left breast mass at 2 o'clock position measuring 0.5 cm, axilla negative, biopsy revealed grade 1 IDC with focal extracellular mucin plus grade 1 DCIS ER 100%, PR 99%, Ki-67 5%, HER2 1+, FISH negative   09/30/2022 Cancer Staging   Staging form: Breast, AJCC 8th Edition - Clinical: Stage IA (cT1b, cN0, cM0, G1, ER+, PR+, HER2-) - Signed by Nicholas Lose, MD on 09/30/2022 Histologic grading system: 3 grade system     CHIEF COMPLIANT: Follow-up after surgery  INTERVAL HISTORY: [name] is a 76 y.o. female is here because of recent diagnosis of left breast cancer. She presents to the clinic for a follow-up after surgery.   ALLERGIES:  is allergic to codeine.  MEDICATIONS:  Current Outpatient Medications  Medication Sig Dispense Refill   acetaminophen (TYLENOL) 500 MG tablet Take 1,000 mg by mouth every 6 (six) hours as needed (pain.).     albuterol (VENTOLIN HFA) 108 (90 Base) MCG/ACT inhaler INHALE 2 PUFFS INTO THE LUNGS EVERY 6 (SIX) HOURS AS NEEDED FOR WHEEZING. 1 each 1   amLODipine (NORVASC) 5 MG tablet Take 5 mg by mouth in the morning.     aspirin EC 81 MG tablet Take 81 mg by mouth at bedtime.     benzonatate (TESSALON) 100 MG capsule Take 200 mg by  mouth 3 (three) times daily as needed for cough.     furosemide (LASIX) 40 MG tablet TAKE 2 TABLETS EVERY DAY 180 tablet 0   Homeopathic Products (ZINC) LOZG Take 1 tablet by mouth daily as needed (immune support).     hydroxychloroquine (PLAQUENIL) 200 MG tablet Take 400 mg by mouth in the morning.     ipratropium (ATROVENT) 0.06 % nasal spray Place 1 spray into both nostrils in the morning and at bedtime.     Lidocaine-Adhesive Sheets (LIDOPURE PATCH EX) Place 1 patch onto the skin daily as needed (pain). OTC     nortriptyline (PAMELOR) 10 MG capsule Take 30 mg by mouth at bedtime.     oxyCODONE (OXY IR/ROXICODONE) 5 MG immediate release tablet Take 1 tablet (5 mg total) by mouth every 6 (six) hours as needed for severe pain. 15 tablet 0   pantoprazole (PROTONIX) 40 MG tablet Take 40 mg by mouth 2 (two) times daily.     predniSONE (DELTASONE) 5 MG tablet Take 5 mg by mouth daily.     zolpidem (AMBIEN) 10 MG tablet Take 10 mg by mouth at bedtime as needed for sleep.     No current facility-administered medications for this visit.    PHYSICAL EXAMINATION: ECOG PERFORMANCE STATUS: {CHL ONC ECOG PS:[id number]}  There were no vitals filed for this visit. There were no vitals filed for this visit.  BREAST:*** No palpable masses or nodules in either right or left breasts. No palpable axillary supraclavicular or infraclavicular adenopathy no breast tenderness or nipple discharge. (exam  performed in the presence of a chaperone)  LABORATORY DATA:  I have reviewed the data as listed    Latest Ref Rng & Units 11/04/2022   12:14 PM 09/30/2022   12:03 PM 03/16/2021    8:27 PM  CMP  Glucose 70 - 99 mg/dL 129  154  134   BUN 8 - 23 mg/dL _0 Creatinine 0.44 - 1.00 mg/dL 0.85  0.83  0.78   Sodium 135 - 145 mmol/L 139  141  139   Potassium 3.5 - 5.1 mmol/L 3.5  3.8  3.0   Chloride 98 - 111 mmol/L 100  101  100   CO2 22 - 32 mmol/L 28  35  28   Calcium 8.9 - 10.3 mg/dL 8.8  9.3  8.8    Total Protein 6.5 - 8.1 g/dL 6.7  7.3  7.5   Total Bilirubin 0.3 - 1.2 mg/dL 1.5  0.9  0.6   Alkaline Phos 38 - 126 U/L 93  92  87   AST 15 - 41 U/L 39  29  40   ALT 0 - 44 U/L 25  24  33     Lab Results  Component Value Date   WBC 14.1 (H) 10/30/2022   HGB 16.0 (H) 10/30/2022   HCT 45.5 10/30/2022   MCV 96.8 10/30/2022   PLT 343 10/30/2022   NEUTROABS 5.4 09/30/2022    ASSESSMENT & PLAN:  No problem-specific Assessment & Plan notes found for this encounter.    No orders of the defined types were placed in this encounter.  The patient has a good understanding of the overall plan. she agrees with it. she will call with any problems that may develop before the next visit here. Total time spent: 30 mins including face to face time and time spent for planning, charting and co-ordination of care   Suzzette Righter, [place] 11/18/22    I Gardiner Coins am acting as a Education administrator for [employer]  ***

## 2022-11-19 ENCOUNTER — Inpatient Hospital Stay: Payer: Medicare HMO | Attending: Hematology and Oncology | Admitting: Hematology and Oncology

## 2022-11-19 NOTE — Assessment & Plan Note (Deleted)
11/04/2022:Left lumpectomy: Grade 1 IDC 0.8 cm, margins negative, ER 100%, PR 99%, HER2 negative 1+, Ki-67 5%  Pathology counseling: I discussed the final pathology report of the patient provided  a copy of this report. I discussed the margins as well as lymph node surgeries. We also discussed the final staging along with previously performed ER/PR and HER-2/neu testing.  Treatment plan: no role of radiation since she has history of scleroderma Adjuvant antiestrogen therapy with anastrozole 1 mg daily x5 years  Anastrozole counseling: We discussed the risks and benefits of anti-estrogen therapy with aromatase inhibitors. These include but not limited to insomnia, hot flashes, mood changes, vaginal dryness, bone density loss, and weight gain. We strongly believe that the benefits far outweigh the risks. Patient understands these risks and consented to starting treatment. Planned treatment duration is 5 years.  Return to clinic in 3 months for survivorship care plan visit  

## 2022-11-24 ENCOUNTER — Telehealth: Payer: Self-pay

## 2022-11-24 NOTE — Telephone Encounter (Signed)
Pt called and states she was to be seen by D 12/21 but she got lost and went to the [place] instead of [place]. Pt was offered appt for f/u 12/01/22 at 0800, she was provided with address and instructions for parking. She verbalized understanding and accepted appt.

## 2022-11-25 DIAGNOSIS — M25811 Other specified joint disorders, right shoulder: Secondary | ICD-10-CM | POA: Diagnosis not present

## 2022-11-26 NOTE — Progress Notes (Signed)
Patient Care Team: Asencion Noble, MD as PCP - General (Internal Medicine) Stanford Breed Denice Bors, MD as PCP - Cardiology (Cardiology) Elsie Stain, MD as Attending Physician (Pulmonary Disease) Rockwell Germany, RN as Oncology Nurse Navigator Mauro Kaufmann, RN as Oncology Nurse Navigator Erroll Luna, MD as Consulting Physician (General Surgery) Nicholas Lose, MD as Consulting Physician (Hematology and Oncology) Kyung Rudd, MD as Consulting Physician (Radiation Oncology)  DIAGNOSIS:  Encounter Diagnosis  Name Primary?   Malignant neoplasm of upper-outer quadrant of left breast in female, estrogen receptor positive ([place]) Yes    SUMMARY OF ONCOLOGIC HISTORY: Oncology History  Malignant neoplasm of upper-outer quadrant of left breast in female, estrogen receptor positive ([name])  09/18/2022 Initial Diagnosis   Screening mammogram detected left breast mass at 2 o'clock position measuring 0.5 cm, axilla negative, biopsy revealed grade 1 IDC with focal extracellular mucin plus grade 1 DCIS ER 100%, PR 99%, Ki-67 5%, HER2 1+, FISH negative   09/30/2022 Cancer Staging   Staging form: Breast, AJCC 8th Edition - Clinical: Stage IA (cT1b, cN0, cM0, G1, ER+, PR+, HER2-) - Signed by Nicholas Lose, MD on 09/30/2022 Histologic grading system: 3 grade system   11/04/2022 Surgery   Left lumpectomy: Grade 1 IDC 0.8 cm, margins negative, ER 100%, PR 99%, HER2 negative 1+, Ki-67 5%     CHIEF COMPLIANT: Follow- up after surgery  INTERVAL HISTORY: [name] is a 76 y.o. female is here because of recent diagnosis of left breast cancer. On adjuvant antiestrogen therapy with anastrozole. She presents to the clinic for a follow-up after surgery. She reports surgery went well. She reports that she did have a fall at home last week, and she has to have a total shoulder replacement.     ALLERGIES:  is allergic to codeine.  MEDICATIONS:  Current Outpatient Medications  Medication Sig Dispense  Refill   acetaminophen (TYLENOL) 500 MG tablet Take 1,000 mg by mouth every 6 (six) hours as needed (pain.).     albuterol (VENTOLIN HFA) 108 (90 Base) MCG/ACT inhaler INHALE 2 PUFFS INTO THE LUNGS EVERY 6 (SIX) HOURS AS NEEDED FOR WHEEZING. 1 each 1   amLODipine (NORVASC) 5 MG tablet Take 5 mg by mouth in the morning.     anastrozole (ARIMIDEX) 1 MG tablet Take 1 tablet (1 mg total) by mouth daily. 90 tablet 3   aspirin EC 81 MG tablet Take 81 mg by mouth at bedtime.     benzonatate (TESSALON) 100 MG capsule Take 200 mg by mouth 3 (three) times daily as needed for cough.     furosemide (LASIX) 40 MG tablet TAKE 2 TABLETS EVERY DAY 180 tablet 0   Homeopathic Products (ZINC) LOZG Take 1 tablet by mouth daily as needed (immune support).     hydroxychloroquine (PLAQUENIL) 200 MG tablet Take 400 mg by mouth in the morning.     ipratropium (ATROVENT) 0.06 % nasal spray Place 1 spray into both nostrils in the morning and at bedtime.     Lidocaine-Adhesive Sheets (LIDOPURE PATCH EX) Place 1 patch onto the skin daily as needed (pain). OTC     nortriptyline (PAMELOR) 10 MG capsule Take 30 mg by mouth at bedtime.     oxyCODONE (OXY IR/ROXICODONE) 5 MG immediate release tablet Take 1 tablet (5 mg total) by mouth every 6 (six) hours as needed for severe pain. 15 tablet 0   pantoprazole (PROTONIX) 40 MG tablet Take 40 mg by mouth 2 (two) times daily.  predniSONE (DELTASONE) 5 MG tablet Take 5 mg by mouth daily.     zolpidem (AMBIEN) 10 MG tablet Take 10 mg by mouth at bedtime as needed for sleep.     No current facility-administered medications for this visit.    PHYSICAL EXAMINATION: ECOG PERFORMANCE STATUS: 1 - Symptomatic but completely ambulatory  Vitals:   12/01/22 0813  BP: 127/61  Pulse: 93  Temp: 97.9 F (36.6 C)  SpO2: 95%   Filed Weights   12/01/22 0813  Weight: 193 lb (87.5 kg)     LABORATORY DATA:  I have reviewed the data as listed    Latest Ref Rng & Units 11/04/2022    12:14 PM 09/30/2022   12:03 PM 03/16/2021    8:27 PM  CMP  Glucose 70 - 99 mg/dL 129  154  134   BUN 8 - 23 mg/dL _0 Creatinine 0.44 - 1.00 mg/dL 0.85  0.83  0.78   Sodium 135 - 145 mmol/L 139  141  139   Potassium 3.5 - 5.1 mmol/L 3.5  3.8  3.0   Chloride 98 - 111 mmol/L 100  101  100   CO2 22 - 32 mmol/L 28  35  28   Calcium 8.9 - 10.3 mg/dL 8.8  9.3  8.8   Total Protein 6.5 - 8.1 g/dL 6.7  7.3  7.5   Total Bilirubin 0.3 - 1.2 mg/dL 1.5  0.9  0.6   Alkaline Phos 38 - 126 U/L 93  92  87   AST 15 - 41 U/L 39  29  40   ALT 0 - 44 U/L 25  24  33     Lab Results  Component Value Date   WBC 14.1 (H) 10/30/2022   HGB 16.0 (H) 10/30/2022   HCT 45.5 10/30/2022   MCV 96.8 10/30/2022   PLT 343 10/30/2022   NEUTROABS 5.4 09/30/2022    ASSESSMENT & PLAN:  Malignant neoplasm of upper-outer quadrant of left breast in female, estrogen receptor positive ([place]) 09/18/2022:Screening mammogram detected left breast mass at 2 o'clock position measuring 0.5 cm, axilla negative, biopsy revealed grade 1 IDC with focal extracellular mucin plus grade 1 DCIS ER 100%, PR 99%, Ki-67 5%, HER2 1+, FISH negative  11/04/22: Left lumpectomy: Grade 1 IDC 0.8 cm, margins negative, ER 100%, PR 99%, HER2 negative 1+, Ki-67 5%  Pathology counseling: I discussed the final pathology report of the patient provided  a copy of this report. I discussed the margins as well as lymph node surgeries. We also discussed the final staging along with previously performed ER/PR and HER-2/neu testing.  Treatment plan: Radiation is not being planned because of history of scleroderma. Antiestrogen therapy with anastrozole 1 mg daily x 5 years  Anastrozole counseling: We discussed the risks and benefits of anti-estrogen therapy with aromatase inhibitors. These include but not limited to insomnia, hot flashes, mood changes, vaginal dryness, bone density loss, and weight gain. We strongly believe that the benefits far outweigh  the risks. Patient understands these risks and consented to starting treatment. Planned treatment duration is 5 years.  Return to clinic in 3 months for survivorship care plan visit    No orders of the defined types were placed in this encounter.  The patient has a good understanding of the overall plan. she agrees with it. she will call with any problems that may develop before the next visit here. Total time spent: 30 mins including face to  face time and time spent for planning, charting and co-ordination of care   Harriette Ohara, MD 12/01/22    I Gardiner Coins am acting as a Education administrator for Dr.Amorina Doerr  I have reviewed the above documentation for accuracy and completeness, and I agree with the above.

## 2022-12-01 ENCOUNTER — Inpatient Hospital Stay: Payer: Medicare HMO | Attending: Hematology and Oncology | Admitting: Hematology and Oncology

## 2022-12-01 ENCOUNTER — Other Ambulatory Visit: Payer: Self-pay

## 2022-12-01 VITALS — BP 127/61 | HR 93 | Temp 97.9°F | Wt 193.0 lb

## 2022-12-01 DIAGNOSIS — Z170 Estrogen receptor positive status [ER+]: Secondary | ICD-10-CM

## 2022-12-01 DIAGNOSIS — Z79811 Long term (current) use of aromatase inhibitors: Secondary | ICD-10-CM | POA: Insufficient documentation

## 2022-12-01 DIAGNOSIS — C50412 Malignant neoplasm of upper-outer quadrant of left female breast: Secondary | ICD-10-CM | POA: Diagnosis not present

## 2022-12-01 MED ORDER — ANASTROZOLE 1 MG PO TABS: 1.0000 mg | ORAL_TABLET | Freq: Every day | ORAL | 3 refills | Status: DC

## 2022-12-01 NOTE — Assessment & Plan Note (Signed)
09/18/2022:Screening mammogram detected left breast mass at 2 o'clock position measuring 0.5 cm, axilla negative, biopsy revealed grade 1 IDC with focal extracellular mucin plus grade 1 DCIS ER 100%, PR 99%, Ki-67 5%, HER2 1+, FISH negative  11/04/22: Left lumpectomy: Grade 1 IDC 0.8 cm, margins negative, ER 100%, PR 99%, HER2 negative 1+, Ki-67 5%  Pathology counseling: I discussed the final pathology report of the patient provided  a copy of this report. I discussed the margins as well as lymph node surgeries. We also discussed the final staging along with previously performed ER/PR and HER-2/neu testing.  Treatment plan: Radiation is not being planned because of history of scleroderma. Antiestrogen therapy with anastrozole 1 mg daily x 5 years  Anastrozole counseling: We discussed the risks and benefits of anti-estrogen therapy with aromatase inhibitors. These include but not limited to insomnia, hot flashes, mood changes, vaginal dryness, bone density loss, and weight gain. We strongly believe that the benefits far outweigh the risks. Patient understands these risks and consented to starting treatment. Planned treatment duration is 5 years.  Return to clinic in 3 months for survivorship care plan visit

## 2022-12-03 ENCOUNTER — Telehealth: Payer: Self-pay | Admitting: Hematology and Oncology

## 2022-12-03 NOTE — Telephone Encounter (Signed)
Scheduled appointment per 1/2 los. Patient is aware.

## 2022-12-17 ENCOUNTER — Encounter (HOSPITAL_COMMUNITY): Payer: Self-pay

## 2022-12-29 ENCOUNTER — Other Ambulatory Visit: Payer: Self-pay | Admitting: Cardiology

## 2022-12-29 DIAGNOSIS — R002 Palpitations: Secondary | ICD-10-CM

## 2022-12-31 ENCOUNTER — Ambulatory Visit: Payer: Medicare HMO | Admitting: Internal Medicine

## 2022-12-31 ENCOUNTER — Encounter: Payer: Self-pay | Admitting: Internal Medicine

## 2022-12-31 VITALS — BP 130/68 | HR 85 | Temp 97.7°F | Ht 63.0 in | Wt 194.2 lb

## 2022-12-31 DIAGNOSIS — M359 Systemic involvement of connective tissue, unspecified: Secondary | ICD-10-CM | POA: Diagnosis not present

## 2022-12-31 DIAGNOSIS — J8410 Pulmonary fibrosis, unspecified: Secondary | ICD-10-CM | POA: Diagnosis not present

## 2022-12-31 DIAGNOSIS — M349 Systemic sclerosis, unspecified: Secondary | ICD-10-CM

## 2022-12-31 DIAGNOSIS — J8489 Other specified interstitial pulmonary diseases: Secondary | ICD-10-CM

## 2022-12-31 NOTE — Progress Notes (Signed)
OV 11/27/2019  Subjective:  Patient ID: [name], female , DOB: [birth year] , age 77 y.o. , MRN: [id number] , ADDRESS: [place] [id number]   11/27/2019 -   Chief Complaint  Patient presents with   Follow-up    Former BQ pt. Pt states she does become SOB with activities and also at times when at rest. Pt also has an occ cough which is worse in the morning and will cough up clear phlegm.   Follow-up interstitial lung disease secondary to connective tissue disease [rheumatologist Tacey Heap, M.D., M.H.S. at [facility] but now Dr Richardson Landry at  [facility])  ILD: Dr Joya Gaskins -> Dr Lake Bells -> DR Chase Caller at ILD center [place] ,  HPI [name] 77 y.o. -presents to the ILD clinic for evaluation of her interstitial lung disease secondary to connective tissue disease (rheumatoid factor 90, ANA 1: 640, ANA second titer 1: 2000 560, positive SCL 70 August 2012 at [facility] and positive [place].  I am meeting her for the first time.  Her rheumatology situation is managed by Dr. Richardson Landry at [place].  She no longer follows at [facility] after the doctor they relocated to [place].  She is maintained on 5 mg prednisone per day.  She is not on any other immunomodulators.  Her last TNF alpha blockade was over 3 years ago.  She is known to have interstitial lung disease.  Last staged in March 2019.  She tells me that since then she continues to have significant amount of cough and shortness of breath.  She believes the shortness of breath is slightly worse.  She thinks her pulmonary issues itself is stable.  She thinks the worsening shortness of breath and the persistent shortness of breath could be due to cardiac issues.  In 2015 she had a right heart catheterization evaluation that was normal.  Last echo was in 2015.  No further work-up since then.  Dyspnea symptom score is listed below.  She in addition she also has significant amount  of cough that she attributes to sinus drainage.  She is not had a CT scan of the sinus.  She says she is allergic to a few things but last tested several years ago.  She does not remember when.  She has seen ENT many years ago does not know the details.  At this point in time she would love to have her symptoms better managed.  IMPRESSION: HRCT Lungs/Pleura: Subpleural reticulation and traction bronchiectasis/bronchiolectasis are again seen without a definite zonal predominance. Findings are worst in the right middle lobe. Pattern and distribution appear stable from prior exams. No pleural fluid. Airway is unremarkable. No air trapping. 1. Pulmonary parenchymal pattern of fibrosis is stable from prior exams and likely due to fibrotic nonspecific interstitial pneumonitis. 2. Cirrhosis, steatosis. 3. Aortic atherosclerosis (ICD10-170.0). Coronary artery calcification.     Electronically Signed   By: Lorin Picket M.D.   On: 03/17/2018 09:09  ROS - per HPI    OV 12/26/2019  Subjective:  Patient ID: [name], female , DOB: 10-Apr-1946 , age 77 y.o. , MRN: [id number] , ADDRESS: [address] [place] [address]   12/26/2019 -   Chief Complaint  Patient presents with   Follow-up    Pt states she has been doing well since last visit and denies any complaints.   Follow-up interstitial lung disease secondary to connective tissue disease  [rheumatologist previously - [employer]  Fanny Dance, M.D., M.H.S. at [facility] but now Dr Richardson Landry at  [facility])  - per records - ILD since 2009  - systemic sclerosis/RA overlap per [facility] notes  = Telangiectasias, Raynaud's, esophageal dysmotility - ? calcinosis on right heel. Per [facility] notes  - Serology   - 2012 at [place]: (rheumatoid factor 90, ANA 1: 640, ANA second titer 1: 2000 560, positive SCL 70 August 2012).   -  [facility] RF + (low titer), ANA + (low titer), SCl 70 +  (in [facility])  - ILD: Dr Joya Gaskins -> Dr Lake Bells -> DR Chase Caller at ILD center  [place]   Rx History - all [place] initiated 12/2011 d/c due to lack of control of arthrits D. MTX initiated 04/2012 -discontinued due to concerns since patient has underlying lung disease   D- CHF - 2015 [facility]   - She had a right heart cath in April 2015 which showed the following:  Hemodynamic Findings:   PA: 43/18 (mean 31)   PCWP: 25   Fick Cardiac Index: 3.45 L/min/m2     xxxxxxxxxxxxxxxxxxxxxxxxxxxxxxxxxxxxxxxxxxxxxxxxxxxxxx HPI [name] 77 y.o. -returns for follow-up to discuss her test results.  She has interstitial lung disease secondary to connective tissue disease.  She has not been able to do her pulmonary function test because of the COVID-19 pandemic and the disruptions.  It is scheduled in the next few to several weeks.  She did have repeat serology and the overwhelmingly strong antibodies to scleroderma antibody.  The rheumatoid factor and ANA weakly positive only.  Her high-resolution CT chest scan of the chest according to thoracic radiology that I personally visualized shows stable pattern compared to 2013.  This is somewhat discrepant with the pulmonary function test that is shown decline and the symptoms are showing decline.  Of course the upcoming pulmonary function test is not available.  Nevertheless it is reassuring that the CT scan is stable.  We discussed her symptoms and it appears she does have diastolic dysfunction is based on the echo.  Clinically she is euvolemic.  She is also physically deconditioned and the BMI is high.  Her other big symptom burden is a cough secondary to significant postnasal drainage.  The CT scan of the sinus itself is normal.  She does have some deviated nasal septum.  Her blood IgE is normal. ANCA is normal 0 S1erology Jan 2021  Results for AIDYNN, POLENDO (MRN [id number]) as of 12/26/2019 11:49  Ref. Range 11/27/2019 12:43  Anti Nuclear Antibody (ANA) Latest Ref Range: NEGATIVE  POSITIVE (A)  ANA Pattern 1 Unknown Nuclear,  Speckled (A)  ANA Titer 1 Latest Units: titer 1:74 (H)  Cyclic Citrullin Peptide Ab Latest Units: UNITS <16  Myeloperoxidase Abs Latest Units: AI <1.0  Serine Protease 3 Latest Units: AI <1.0  RA Latex Turbid. Latest Ref Range: <14 IU/mL 17 (H)     IMPRESSIONS - echo jan 2021      1. Left ventricular ejection fraction, by visual estimation, is 60 to 65%. The left ventricle has normal function. There is moderately increased left ventricular hypertrophy.  2. Left ventricular diastolic parameters are consistent with Grade I diastolic dysfunction (impaired relaxation).  3. The left ventricle has no regional wall motion abnormalities.  4. Global right ventricle has normal systolic function.The right ventricular size is normal. No increase in right ventricular wall thickness.  5. Left atrial size was mildly dilated.  6. Right atrial size was normal.  7. The mitral  valve is normal in structure. No evidence of mitral valve regurgitation. No evidence of mitral stenosis.  8. The tricuspid valve is normal in structure.  9. The tricuspid valve is normal in structure. Tricuspid valve regurgitation is not demonstrated. 10. The aortic valve is tricuspid. Aortic valve regurgitation is not visualized. No evidence of aortic valve sclerosis or stenosis. 11. The pulmonic valve was not well visualized. Pulmonic valve regurgitation is not visualized. 12. The inferior vena cava is normal in size with greater than 50% respiratory variability, suggesting right atrial pressure of 3 mmHg.     Results for Vickie, [name] (MRN [id number]) as of 11/27/2019 12:15  Ref. Range 09/05/2013 15:00 03/07/2014 12:09 02/07/2015 10:34 09/22/2016 15:33 02/25/2018 14:59  FVC-Pre Latest Units: L ? 2.03 2.04 1.97 1.94 1.75  FVC-%Pred-Pre Latest Units: % 68 70 68 68 60   Results for Vickie, [name] (MRN [id number]) as of 11/27/2019 12:15  Ref. Range 09/05/2013 15:00 03/07/2014 12:09 02/07/2015 10:34 09/22/2016 15:33 02/25/2018 14:59   DLCO unc Latest Units: ml/min/mmHg 14.81 14.71 15.60 15.97 16.44  DLCO unc % pred Latest Units: % 68 68 72 74 71    IMPRESSION: HRCT JAn 2021 1. There is a spectrum of findings in the lungs compatible with interstitial lung disease, which appear stable compared to the prior examination. This is technically characterized as compatible with usual interstitial pneumonia (UIP) per current ATS guidelines. However, given the stability dating back to at least 2013 and the overall appearance, this is again favored to reflect fibrotic phase nonspecific interstitial pneumonia (NSIP). 2. Aortic atherosclerosis, in addition to left main and 3 vessel coronary artery disease. Assessment for potential risk factor modification, dietary therapy or pharmacologic therapy may be warranted, if clinically indicated. 3. Hepatic steatosis.   Aortic Atherosclerosis (ICD10-I70.0).     Electronically Signed   By: Vinnie Langton M.D.   On: 12/19/2019 14:33  CT SINUS JAn 2021   EXAM: CT PARANASAL SINUS LIMITED WITHOUT CONTRAST   TECHNIQUE: Non-contiguous multidetector CT images of the paranasal sinuses were obtained in a single plane without contrast.   COMPARISON:  None.   FINDINGS: The included portions of the paranasal sinuses are clear without evidence of significant mucosal thickening or fluid. Rightward nasal septal deviation measures 3 mm. The regional soft tissues are grossly unremarkable.   IMPRESSION: Clear paranasal sinuses.     Electronically Signed   By: Logan Bores M.D.   On: 12/19/2019 14:15    ECHO JAn 2021  IMPRESSIONS      1. Left ventricular ejection fraction, by visual estimation, is 60 to 65%. The left ventricle has normal function. There is moderately increased left ventricular hypertrophy.  2. Left ventricular diastolic parameters are consistent with Grade I diastolic dysfunction (impaired relaxation).  3. The left ventricle has no regional wall motion  abnormalities.  4. Global right ventricle has normal systolic function.The right ventricular size is normal. No increase in right ventricular wall thickness.  5. Left atrial size was mildly dilated.  6. Right atrial size was normal.  7. The mitral valve is normal in structure. No evidence of mitral valve regurgitation. No evidence of mitral stenosis.  8. The tricuspid valve is normal in structure.  9. The tricuspid valve is normal in structure. Tricuspid valve regurgitation is not demonstrated. 10. The aortic valve is tricuspid. Aortic valve regurgitation is not visualized. No evidence of aortic valve sclerosis or stenosis. 11. The pulmonic valve was not well visualized. Pulmonic valve regurgitation is not visualized. 12.  The inferior vena cava is normal in size with greater than 50% respiratory variability, suggesting right atrial pressure of 3 mmHg.    OV 08/08/2020  Subjective:  Patient ID: [name], female , DOB: 12/31/1945 , age 61 y.o. , MRN: [id number] , ADDRESS: [address] [place] [address]   08/08/2020 -   Chief Complaint  Patient presents with   Follow-up    here to go over pft results    Follow-up interstitial lu HPI [name] 77 y.o. -presents for the above issues.  She is on observation therapy.  Overall she states that her shortness of breath and symptoms are the same although she is quite symptomatic.  She understands that she has multifactorial dyspnea from obesity, diastolic dysfunction, fitness and ILD.  She had a pulmonary function test that is documented below.  Shows continued decline in FVC but the DLCO is stable.  She could not attend pulmonary rehabilitation because sometime in the spring I suppose she fractured her left hand.  She is willing to reattend pulmonary rehabilitation.  We discussed the idea of starting antifibrotic's given the fact she has ILD associated with scleroderma.  But she has baseline mild diarrhea which is from irritable bowel  syndrome.  She says that GI has cleared her and she is no longer at follow-up with them.  But she is afraid to take nintedanib because of the diarrhea.  We discussed the alternative of taking immunosuppression such as CellCept and or Tocilizumab but she is definitely resistant to this idea because of the immunomodulation  In terms of vaccine: She is in need of having the new Shingrix vaccine.  She can benefit from a Covid booster.  She can also benefit from high-dose flu shot.  We discussed all this.      OV 06/19/2022  Subjective:  Patient ID: [name], female , DOB: 1946-08-21 , age 43 y.o. , MRN: [id number] , ADDRESS: [place] [id number] PCP Asencion Noble, MD Patient Care Team: Asencion Noble, MD as PCP - General (Internal Medicine) Stanford Breed Denice Bors, MD as PCP - Cardiology (Cardiology) Elsie Stain, MD as Attending Physician (Pulmonary Disease)  This Provider for this visit: Treatment Team:  Attending Provider: Brand Males, MD    06/19/2022 -   Chief Complaint  Patient presents with   Follow-up    Follow-up SOB, ILD, with normal cough.     HPI [name] 78 y.o. -returns for follow-up.  I personally have not seen this in September 2021.  She states that she did not follow-up other than 1 seeing the nurse practitioner because overall she was doing well.  She felt she did not see need to.  She is aware in the past that we discussed antifibrotic nintedanib because of the UIP pattern that she has on her CT chest.  She seems more interested in therapeutic options at this visit.  She initially said no change in her dyspnea but as I spoke to her she said maybe she is slightly worse.  She says that when she fixes do not takes a shower she has to take 5-10 minutes rest because of shortness of breath and then she exerts.  Overall dyspnea score is stable but dyspnea for stairs is worse.  There is no interim hospitalization or ER visits or medication changes.   She has recently had cataract surgery and has a pending right cataract surgery.  We discussed patient support group.  She is very  interested in connecting with Ms. Butch Penny who is his local support group leader.  Her husband is here with her.  Husband is sleep apnea and has new consult visit July 01, 2022 with Geraldo Pitter.  He had some questions.  I have asked the CMA to record his questions and send it to Geraldo Pitter.     PFT  OV 08/20/2022  Subjective:  Patient ID: [name], female , DOB: January 26, 1946 , age 54 y.o. , MRN: [id number] , ADDRESS: [place] [id number] PCP Asencion Noble, MD Patient Care Team: Asencion Noble, MD as PCP - General (Internal Medicine) Stanford Breed Denice Bors, MD as PCP - Cardiology (Cardiology) Elsie Stain, MD as Attending Physician (Pulmonary Disease)  This Provider for this visit: Treatment Team:  Attending Provider: Brand Males, MD    08/20/2022 -   Chief Complaint  Patient presents with   Follow-up    Pt states she is about the same since last visit and denies any complaints.    HPI [name] 77 y.o. -returns for follow-up.  This visit is tod iscuss test results.  She did have echocardiogram that shows grade 1 diastolic dysfunction which is to be expected given her age and obesity.  She had pulmonary function test that shows a decline much as suspected both in the FVC and DLCO compared to 2 years ago.  She was supposed to have overnight pulse oximetry but according to the CMA an order was done but the test was not done.  She also did not show up for a high-resolution CT scan of the chest at [facility].  In discussions with her it appears that she believes that her husband took the phone call.  Husband has early onset dementia and might of forgotten.  The same is now put orders to get the stool test done with patient cell phone being the primary contact.  Patient will now reschedule the visit.  She will need to  get CT scan of the chest done in 010 done before we can make further progress.  Nevertheless she appears to have progressive phenotype       09/28/2022 Patient presents today for follow-up visit.  Patient was scheduled for a follow-u visit on 11/30 with Dr. Chase Caller but came into the office today looking to go over recent CT imaging results.  Patient had overnight oximetry test on 08/27/2022 that showed very mild oxygen desaturation, pulse ox <88% for 5 minutes and 33 seconds. CT chest in October 2023 showed unchanged appearance of lungs, findings considered diagnostic for UIP.  However given stability compared with studies dating back to 2013 alternative diagnoses such as NSIP is of consideration. She is on Plaquenil and prednisone '5mg'$  daily for RA, symptoms are well control. She would like to hold off on starting nocturnal oxygen. Recent dx breast cancer, planning for lumpectomy.      OV 12/31/2022  Subjective:  Patient ID: [name], female , DOB: [birth year] , age 20 y.o. , MRN: [id number] , ADDRESS: [place] [phone] PCP Asencion Noble, MD Patient Care Team: Asencion Noble, MD as PCP - General (Internal Medicine) Stanford Breed Denice Bors, MD as PCP - Cardiology (Cardiology) Elsie Stain, MD as Attending Physician (Pulmonary Disease) Rockwell Germany, RN as Oncology Nurse Navigator Mauro Kaufmann, RN as Oncology Nurse Navigator Erroll Luna, MD as Consulting Physician (General Surgery) Nicholas Lose, MD as Consulting Physician (Hematology and Oncology) Kyung Rudd, MD as Consulting  Physician (Radiation Oncology)  This Provider for this visit: Treatment Team:  Attending Provider: Brand Males, MD   ILD secondary to connective tissue disease  [rheumatologist previously - Beatriz Stallion, M.D., M.H.S. at [facility] but now Dr Richardson Landry at  [facility])  - per records - ILD since 2009  - systemic sclerosis/RA overlap per [facility] notes  = Telangiectasias, Raynaud's, esophageal  dysmotility - ? calcinosis on right heel. Per [facility] notes  - Serology   - 2012 at [place]: (rheumatoid factor 90, ANA 1: 640, ANA second titer 1: 2000 560, positive SCL 70 August 2012).   -  [facility]: RF + (low titer), ANA + (low titer), SCl 70 +  (in [facility])  -High-resolution CT chest January 2021 with UIP features [stable since 2013] -> stable oct 2023  - ILD: Dr Joya Gaskins -> Dr Lake Bells -> DR Chase Caller at ILD center [place] initiated 12/2011 d/c due to lack of control of arthrits . MTX initiated 04/2012 -discontinued due to concerns since patient has underlying lung disease Plaquenil and prednisone as of July 2023 and feb 2024   D- CHF -   [facility]  -  - She had a right heart cath in April 2015 which showed the following: Hemodynamic Findings:   PA: 43/18 (mean 31)   PCWP: 25   Fick Cardiac Index: 3.45 L/min/m2    -Grade 1 diastolic dysfunction on echocardiogram January 2021  12/31/2022 -   Chief Complaint  Patient presents with   Follow-up    No c/o     HPI [name] 77 y.o. -returns for follow-up.  Since her last visit she fell and tore the tendons in her right shoulder.  She has upcoming surgery in 2 weeks.  She has problems with abduction.  In addition she got diagnosed with breast cancer.  Was early stage.  She is on Arimidex.  She has seen Dr. Lindi Adie per review of the external medical records.  She had a left lumpectomy 11/04/2022 grade 1.  Tolerated the surgery quite well.  She is being monitored.  She feels stable from a respiratory standpoint.  Symptoms are stable below.  She continues to see rheumatology Dr. Amil Amen.  She is on Plaquenil and low-dose prednisone.  In the fall 2023 her CT scan of the chest LAD in a few years that she might be progressing with her ILD.  Current symptom score is as below and overall is range bound. \    SYMPTOM SCALE - ILD 12/26/2019   08/08/2020 198# 06/19/2022 197# 08/20/2022   09/28/2022   12/31/2022   O2 use ra ra ra   RA ra  Shortness of Breath 0 -> 5 scale with 5 being worst (score 6 If unable to do)          At rest 0 0 0 2 0 0  Simple tasks - showers, clothes change, eating, shaving '3 2 1 3 2 1  '$ Household (dishes, doing bed, laundry) '3 3 1 2 2 2  '$ Shopping  '2 2 1 3 2 3  '$ Walking level at own pace 3 2 1.5 2 0 2  Walking up Stairs '2 3 4 4 4 4  '$ Total (30-36) Dyspnea Score 14 12 8.'5 16 10 12  '$ How bad is your cough? 3 3 At times   2 0  How bad is your fatigue '3 3 1 2 2 2  '$ How bad is nausea  3 2 0 0 0 0  How bad is vomiting?   0 0 0 0 0 0  How bad is diarrhea? 3 0 1 0 0 0  How bad is anxiety? '3 3 2 2 '$ 4- recent breast cancer dx 2  How bad is depression '3 3 2 1 3 2       '$ Simple office walk 185 feet x  3 laps goal with forehead probe 08/08/2020  06/19/2022  08/20/2022  12/31/2022   O2 used ra ra ra ra  Number laps completed '3 3 3 3  '$ Comments about pace Mild steady pace Slow pace r   Resting Pulse Ox/HR 96% and 79/min 100% and 90 99% and HR 91 97% n  Final Pulse Ox/HR 94% and 101/min 94% and HR 108 92% and HR 104 93%  Desaturated </= 88% no no    Desaturated <= 3% points no Yes, 6 Yes 7 points Yes 4 points  Got Tachycardic >/= 90/min yes yes yes   Symptoms at end of test none Some dyspnea mild No dspnea  Miscellaneous comments x   stable    WALK Test     PFT     Latest Ref Rng & Units 07/27/2022    4:11 PM 07/23/2020   11:58 AM 02/25/2018    2:59 PM 09/22/2016    3:33 PM 02/07/2015   10:34 AM 03/07/2014   12:09 PM 09/05/2013    3:00 PM  PFT Results  FVC-Pre L 1.51  1.70  1.75  1.94  1.97  2.04    FVC-Predicted Pre % 55  60  60  68  68  70  68      FVC-Post L   1.78  1.92  1.94   2.03      FVC-Predicted Post %   61  68  67   69      Pre FEV1/FVC % % 76  73  77  74  76  75  79      Post FEV1/FCV % %   78  79  80   82      FEV1-Pre L 1.15  1.25  1.35  1.44  1.50  1.54  1.57      FEV1-Predicted Pre % 56  59  62  67  68  69  70      FEV1-Post L   1.38  1.51  1.56    1.66      DLCO uncorrected ml/min/mmHg 14.37  16.83  16.44  15.97  15.60  14.71  14.81      DLCO UNC% % 77  90  71  74  72  68  68      DLCO corrected ml/min/mmHg 14.37  16.83   15.87      DLCO COR %Predicted % 77  90   73      DLVA Predicted % 122  142  122  114  115  111  107      TLC L    3.40  2.92   3.00      TLC % Predicted %    71  61   63      RV % Predicted %    72  50   54         This result is from an external source.   HRCT 08/31/22  Narrative & Impression  CLINICAL DATA:  77 year old female with history of interstitial lung disease. Follow-up  study.   EXAM: CT CHEST WITHOUT CONTRAST   TECHNIQUE: Multidetector CT imaging of the chest was performed following the standard protocol without intravenous contrast. High resolution imaging of the lungs, as well as inspiratory and expiratory imaging, was performed.   RADIATION DOSE REDUCTION: This exam was performed according to the departmental dose-optimization program which includes automated exposure control, adjustment of the mA and/or kV according to patient size and/or use of iterative reconstruction technique.   COMPARISON:  High-resolution chest CT 12/10/2020.   FINDINGS: Cardiovascular: Heart size is borderline enlarged. There is no significant pericardial fluid, thickening or pericardial calcification. There is aortic atherosclerosis, as well as atherosclerosis of the great vessels of the mediastinum and the coronary arteries, including calcified atherosclerotic plaque in the left anterior descending, left circumflex and right coronary arteries. Calcifications of the aortic valve.   Mediastinum/Nodes: No pathologically enlarged mediastinal or hilar lymph nodes. Please note that accurate exclusion of hilar adenopathy is limited on noncontrast CT scans. Small hiatal hernia. No axillary lymphadenopathy.   Lungs/Pleura: High-resolution images demonstrate widespread areas of mild ground-glass attenuation,  extensive septal thickening, thickening of the peribronchovascular interstitium, cylindrical bronchiectasis, peripheral bronchiolectasis and honeycombing scattered throughout the lungs bilaterally with a definitive craniocaudal gradient. Findings appear stable compared to the prior examination. No acute consolidative airspace disease. No pleural effusions. Inspiratory and expiratory imaging is unremarkable. No definite suspicious appearing pulmonary nodules or masses are noted.   Upper Abdomen: Diffuse low attenuation throughout the visualized hepatic parenchyma, indicative of a background of hepatic steatosis. Liver has a nodular contour, indicative of underlying cirrhosis. Status post cholecystectomy. Atherosclerotic calcifications in the abdominal aorta.   Musculoskeletal: There are no aggressive appearing lytic or blastic lesions noted in the visualized portions of the skeleton.   IMPRESSION: 1. The appearance of the lungs is unchanged compared to the prior study, with a spectrum of findings once again considered diagnostic of usual interstitial pneumonia (UIP) per current ATS guidelines. However, given the stability in the appearance of the lungs compared to remote prior studies dating back to 2013, an alternative diagnosis such as fibrotic phase nonspecific interstitial pneumonia (NSIP) warrants consideration. 2. Aortic atherosclerosis, in addition to three-vessel coronary artery disease. Assessment for potential risk factor modification, dietary therapy or pharmacologic therapy may be warranted, if clinically indicated. 3. There are calcifications of the aortic valve. Echocardiographic correlation for evaluation of potential valvular dysfunction may be warranted if clinically indicated. 4. Cirrhosis with severe hepatic steatosis.   Aortic Atherosclerosis (ICD10-I70.0).     Electronically Signed   By: Vinnie Langton M.D.   On: 08/31/2022 09:47      Result  History     has a past medical history of Diastolic congestive heart failure ([place]), GERD (gastroesophageal reflux disease), Hepatic steatosis, History of blood transfusion, cholecystectomy, Interstitial lung disease ([place]), Osteoarthritis, Pulmonary fibrosis ([place]), Raynaud disease, Rheumatoid arthritis(714.0), Scleroderma ([place]), Seasonal allergies, Shortness of breath dyspnea, and Systemic sclerosis ([place]).   reports that she quit smoking about 34 years ago. Her smoking use included cigarettes. She has a 20.00 pack-year smoking history. She has never used smokeless tobacco.  Past Surgical History:  Procedure Laterality Date   ABDOMINAL HYSTERECTOMY     BLADDER SUSPENSION     BREAST BIOPSY  11/03/2022   MM LT RADIOACTIVE SEED LOC MAMMO GUIDE 11/03/2022 [facility] MAMMOGRAPHY   BREAST LUMPECTOMY WITH RADIOACTIVE SEED LOCALIZATION Left 11/04/2022   Procedure: LEFT BREAST LUMPECTOMY WITH RADIOACTIVE SEED LOCALIZATION;  Surgeon: Erroll Luna, MD;  Location: [place];  Service:  General;  Laterality: Left;   CARPAL TUNNEL RELEASE     LAPAROSCOPIC CHOLECYSTECTOMY     LEFT HEART CATHETERIZATION WITH CORONARY/GRAFT ANGIOGRAM  03/15/2014   Procedure: LEFT HEART CATHETERIZATION WITH Beatrix Fetters;  Surgeon: Burnell Blanks, MD;  Location: [facility] CATH LAB;  Service: Cardiovascular;;   TONSILLECTOMY     TOTAL KNEE ARTHROPLASTY Left 02/11/2015   Procedure: LEFT TOTAL KNEE ARTHROPLASTY, RIGHT KNEE CORTISONE INJECTION;  Surgeon: Gaynelle Arabian, MD;  Location: [facility];  Service: Orthopedics;  Laterality: Left;   TOTAL KNEE ARTHROPLASTY Right 10/05/2016   Procedure: RIGHT TOTAL KNEE ARTHROPLASTY;  Surgeon: Gaynelle Arabian, MD;  Location: [facility];  Service: Orthopedics;  Laterality: Right;   VESICOVAGINAL FISTULA CLOSURE W/ TAH  1987    Allergies  Allergen Reactions   Codeine     Neurological problems - hallucinations, "spacey"    Immunization History  Administered Date(s) Administered   Influenza Split  08/01/2011, 09/02/2012, 08/30/2013, 08/29/2016   Influenza, High Dose Seasonal PF 08/28/2017   Influenza,inj,Quad PF,6+ Mos 09/03/2014   Influenza-Unspecified 08/14/2014, 07/20/2019, 08/15/2019   Moderna Sars-Covid-2 Vaccination 12/21/2018, 01/24/2020   Pneumococcal Conjugate-13 09/19/2014, 11/28/2014   Pneumococcal Polysaccharide-23 11/30/2008, 08/30/2009   Zoster, Live 11/30/2008    Family History  Problem Relation Age of Onset   Stroke Mother    Heart disease Mother        CABG at age 42   Heart attack Father        Died of MI at age 78   Emphysema Father    Dementia Brother    Osteoarthritis Other    Celiac disease Other      Current Outpatient Medications:    acetaminophen (TYLENOL) 500 MG tablet, Take 1,000 mg by mouth every 6 (six) hours as needed (pain.)., Disp: , Rfl:    albuterol (VENTOLIN HFA) 108 (90 Base) MCG/ACT inhaler, INHALE 2 PUFFS INTO THE LUNGS EVERY 6 (SIX) HOURS AS NEEDED FOR WHEEZING., Disp: 1 each, Rfl: 1   amLODipine (NORVASC) 5 MG tablet, Take 5 mg by mouth in the morning., Disp: , Rfl:    anastrozole (ARIMIDEX) 1 MG tablet, Take 1 tablet (1 mg total) by mouth daily., Disp: 90 tablet, Rfl: 3   aspirin EC 81 MG tablet, Take 81 mg by mouth at bedtime., Disp: , Rfl:    benzonatate (TESSALON) 100 MG capsule, Take 200 mg by mouth 3 (three) times daily as needed for cough., Disp: , Rfl:    furosemide (LASIX) 40 MG tablet, TAKE 2 TABLETS EVERY DAY (NEED MD APPOINTMENT FOR REFILLS), Disp: 60 tablet, Rfl: 3   Homeopathic Products (ZINC) LOZG, Take 1 tablet by mouth daily as needed (immune support)., Disp: , Rfl:    hydroxychloroquine (PLAQUENIL) 200 MG tablet, Take 400 mg by mouth in the morning., Disp: , Rfl:    ipratropium (ATROVENT) 0.06 % nasal spray, Place 1 spray into both nostrils in the morning and at bedtime., Disp: , Rfl:    Lidocaine-Adhesive Sheets (LIDOPURE PATCH EX), Place 1 patch onto the skin daily as needed (pain). OTC, Disp: , Rfl:     nortriptyline (PAMELOR) 10 MG capsule, Take 30 mg by mouth at bedtime., Disp: , Rfl:    oxyCODONE (OXY IR/ROXICODONE) 5 MG immediate release tablet, Take 1 tablet (5 mg total) by mouth every 6 (six) hours as needed for severe pain., Disp: 15 tablet, Rfl: 0   pantoprazole (PROTONIX) 40 MG tablet, Take 40 mg by mouth 2 (two) times daily., Disp: , Rfl:  predniSONE (DELTASONE) 5 MG tablet, Take 5 mg by mouth daily., Disp: , Rfl:    zolpidem (AMBIEN) 10 MG tablet, Take 10 mg by mouth at bedtime as needed for sleep., Disp: , Rfl:       Objective:   Vitals:   12/31/22 1304  BP: 130/68  Pulse: 85  Temp: 97.7 F (36.5 C)  TempSrc: Oral  SpO2: 96%  Weight: 194 lb 3.2 oz (88.1 kg)  Height: '5\' 3"'$  (1.6 m)    Estimated body mass index is 34.4 kg/m as calculated from the following:   Height as of this encounter: '5\' 3"'$  (1.6 m).   Weight as of this encounter: 194 lb 3.2 oz (88.1 kg).  '@WEIGHTCHANGE'$ @  [employer]   12/31/22 1304  Weight: 194 lb 3.2 oz (88.1 kg)     Physical Exam    General: No distress. [place] Neuro: Alert and Oriented x 3. GCS 15. Speech normal Psych: Pleasant Resp:  Barrel Chest - no.  Wheeze - no, Crackles - yes fine at base, No overt respiratory distress CVS: Normal heart sounds. Murmurs - no Ext: Stigmata of Connective Tissue Disease - no HEENT: Normal upper airway. PEERL +. No post nasal drip        Assessment:       ICD-10-CM   1. Interstitial lung disease due to connective tissue disease ([place])  J84.89    M35.9     2. Pulmonary fibrosis ([place])  J84.10     3. Systemic sclerosis ([place])  M34.9          Plan:     Patient Instructions  Interstitial lung disease due to connective tissue disease ([place]) Scleroderma ([place])   - pulmonary fibrosis stable clinically . - Echo fall 2023 is normal - PFT aug 2023 declining but symptoms Jan 2024 and HRCT oct 2023 are strable   Plan - -do spirometry and dlco in 4-6 monhts - continue primary treatment  of scleroderma through rheum   Followup 30 min visit in 4-6 months after PFT  =- symptoms score and walk test at followu     SIGNATURE    Dr. Brand Males, M.D., F.C.C.P,  Pulmonary and Critical Care Medicine Staff Physician, [place] Director - Interstitial Lung Disease  Program  Pulmonary [place] [facility] [place], [place], [address]  Pager: [phone], If no answer or between  15:00h - 7:00h: call [phone] Telephone: [phone]  1:23 PM 12/31/2022

## 2022-12-31 NOTE — Patient Instructions (Addendum)
Interstitial lung disease due to connective tissue disease ([name]) Scleroderma ([name])   - pulmonary fibrosis stable clinically . - Echo fall 2023 is normal - PFT aug 2023 declining but symptoms Jan 2024 and HRCT oct 2023 are strable   Plan - -do spirometry and dlco in 4-6 monhts - continue primary treatment of scleroderma through rheum   Followup 30 min visit in 4-6 months after PFT  =- symptoms score and walk test at followu

## 2023-01-05 NOTE — Progress Notes (Signed)
Patient is a retired Chart[employer] loss adjuster from [employer] Vaccine received:  '[]'$  No '[x]'$  Yes Date of any COVID positive Test in last 90 days:  None  PCP - Asencion Noble, MD Cardiologist - Kirk Ruths, MD Pulmonology- Brand Males, MD Oncology- Nicholas Lose, MD  Chest x-ray - CT chest  08-31-2022  epic EKG -  10-30-2022  epic Stress Test - Carlton Adam 02-12-2021  epic ECHO - 07-14-2022  epic Cardiac Cath - 2015  RHC by Darlina Guys, MD  PCR screen: '[x]'$  Ordered & Completed                      '[]'$   No Order but Needs PROFEND                      '[]'$   N/A for this surgery  Surgery Plan:  '[x]'$  Ambulatory                            '[]'$  Outpatient in bed                            '[]'$  Admit  Anesthesia:    '[x]'$  General  '[]'$  Spinal                           '[]'$   Choice '[]'$   MAC  Pacemaker / ICD device '[x]'$  No '[]'$  Yes        Device order form faxed '[x]'$  No    '[]'$   Yes      Faxed to:  Spinal Cord Stimulator:'[x]'$  No '[]'$  Yes      (Remind patient to bring remote DOS) Other Implants:   History of Sleep Apnea? '[x]'$  No '[]'$  Yes   CPAP used?- '[x]'$  No '[]'$  Yes    Does the patient monitor blood sugar? '[]'$  No '[]'$  Yes  '[x]'$  N/A   no DM  Blood Thinner / Instructions:  none Aspirin Instructions:  ASA '81mg'$    Patient said she was told to Hold x 7 days  ERAS Protocol Ordered: '[]'$  No  '[x]'$  Yes PRE-SURGERY '[x]'$  ENSURE  '[]'$  G2  Patient is to be NPO after: 06:30  am  Comments: The patient was given Benzoyl peroxide Gel as ordered. Instruction regarding application starting 2 days prior to surgery was given and patient voiced understanding.   Activity level: Patient can climb a flight of stairs without difficulty; '[x]'$  No CP   but would have _SOB   Patient can perform ADLs without assistance.   Anesthesia review: ILD, CHF, HTN, cirrhosis- NASH, RA, Raynaud's, scleraderma, s/p breast cancer? current chemotherapy  Patient denies shortness of breath, fever, cough and chest pain at PAT appointment.  Patient  verbalized understanding and agreement to the Pre-Surgical Instructions that were given to them at this PAT appointment. Patient was also educated of the need to review these PAT instructions again prior to his/her surgery.I reviewed the appropriate phone numbers to call if they have any and questions or concerns.

## 2023-01-05 NOTE — Patient Instructions (Signed)
SURGICAL WAITING ROOM VISITATION Patients having surgery or a procedure may have no more than 2 support people in the waiting area - these visitors may rotate in the visitor waiting room.   Due to an increase in RSV and influenza rates and associated hospitalizations, children ages 86 and under may not visit patients in [place]. If the patient needs to stay at the hospital during part of their recovery, the visitor guidelines for inpatient rooms apply.  PRE-OP VISITATION  Pre-op nurse will coordinate an appropriate time for 1 support person to accompany the patient in pre-op.  This support person may not rotate.  This visitor will be contacted when the time is appropriate for the visitor to come back in the pre-op area.  Please refer to the [facility] website for the visitor guidelines for Inpatients (after your surgery is over and you are in a regular room).  You are not required to quarantine at this time prior to your surgery. However, you must do this: Hand Hygiene often Do NOT share personal items Notify your provider if you are in close contact with someone who has COVID or you develop fever 100.4 or greater, new onset of sneezing, cough, sore throat, shortness of breath or body aches.  If you test positive for Covid or have been in contact with anyone that has tested positive in the last 10 days please notify you surgeon.    Your procedure is scheduled on:  Thursday  January 21, 2023  Report to [facility] Main Entrance: [name] entrance where the [employer] is available.   Report to admitting at: 07:00  AM  +++++Call this number if you have any questions or problems the morning of surgery [phone]  Do not eat food after Midnight the night prior to your surgery/procedure.  After Midnight you may have the following liquids until  06:30 AM DAY OF SURGERY  Clear Liquid Diet Water Black Coffee (sugar ok, NO MILK/CREAM OR CREAMERS)  Tea (sugar ok, NO  MILK/CREAM OR CREAMERS) regular and decaf                             Plain Jell-O  with no fruit (NO RED)                                           Fruit ices (not with fruit pulp, NO RED)                                     Popsicles (NO RED)                                                                  Juice: apple, WHITE grape, WHITE cranberry Sports drinks like Gatorade or Powerade (NO RED)                   The day of surgery:  Drink ONE (1) Pre-Surgery Clear Ensure at 06:30  AM the morning of surgery. Drink in one sitting. Do not sip.  This drink was given to you during your hospital pre-op appointment visit. Nothing else to drink after completing the Pre-Surgery Clear Ensure : No candy, chewing gum or throat lozenges.    FOLLOW  ANY ADDITIONAL PRE OP INSTRUCTIONS YOU RECEIVED FROM YOUR SURGEON'S OFFICE!!!   Oral Hygiene is also important to reduce your risk of infection.        Remember - BRUSH YOUR TEETH THE MORNING OF SURGERY WITH YOUR REGULAR TOOTHPASTE  Do NOT smoke after Midnight the night before surgery.  Take ONLY these medicines the morning of surgery with A SIP OF WATER: anastrozole (Arimidex), amlodipine (Norvasc), Pantoprazole (Protonix), You may take Tylenol if needed for pain. You may use your Albuterol inhaler and Atrovent nasal spray.   If You have been diagnosed with Sleep Apnea - Bring CPAP mask and tubing day of surgery. We will provide you with a CPAP machine on the day of your surgery.                   You may not have any metal on your body including hair pins, jewelry, and body piercing  Do not wear make-up, lotions, powders, perfumes  or deodorant  Do not wear nail polish including gel and S&S, artificial / acrylic nails, or any other type of covering on natural nails including finger and toenails. If you have artificial nails, gel coating, etc., that needs to be removed by a nail salon, Please have this removed prior to surgery. Not doing so may  mean that your surgery could be cancelled or delayed if the Surgeon or anesthesia staff feels like they are unable to monitor you safely.   Do not shave 48 hours prior to surgery to avoid nicks in your skin which may contribute to postoperative infections.   Contacts, Hearing Aids, dentures or bridgework may not be worn into surgery. DENTURES WILL BE REMOVED PRIOR TO SURGERY PLEASE DO NOT APPLY "Poly grip" OR ADHESIVES!!!  Patients discharged on the day of surgery will not be allowed to drive home.  Someone NEEDS to stay with you for the first 24 hours after anesthesia.  Do not bring your home medications to the hospital. The Pharmacy will dispense medications listed on your medication list to you during your admission in the Hospital.  Special Instructions: Bring a copy of your healthcare power of attorney and living will documents the day of surgery, if you wish to have them scanned into your [place] Medical Records- EPIC  Please read over the following fact sheets you were given: IF YOU HAVE QUESTIONS ABOUT YOUR PRE-OP INSTRUCTIONS, PLEASE CALL [phone]  ([place])   [place] - Preparing for Surgery Before surgery, you can play an important role.  Because skin is not sterile, your skin needs to be as free of germs as possible.  You can reduce the number of germs on your skin by washing with CHG (chlorahexidine gluconate) soap before surgery.  CHG is an antiseptic cleaner which kills germs and bonds with the skin to continue killing germs even after washing. Please DO NOT use if you have an allergy to CHG or antibacterial soaps.  If your skin becomes reddened/irritated stop using the CHG and inform your nurse when you arrive at Short Stay. Do not shave (including legs and underarms) for at least 48 hours prior to the first CHG shower.  You may shave your face/neck.  Please follow these instructions carefully:  1.  Shower with CHG Soap the night before surgery and  the  morning of  surgery.  2.  If you choose to wash your hair, wash your hair first as usual with your normal  shampoo.  3.  After you shampoo, rinse your hair and body thoroughly to remove the shampoo.                             4.  Use CHG as you would any other liquid soap.  You can apply chg directly to the skin and wash.  Gently with a scrungie or clean washcloth.  5.  Apply the CHG Soap to your body ONLY FROM THE NECK DOWN.   Do not use on face/ open                           Wound or open sores. Avoid contact with eyes, ears mouth and genitals (private parts).                       Wash face,  Genitals (private parts) with your normal soap.             6.  Wash thoroughly, paying special attention to the area where your  surgery  will be performed.  7.  Thoroughly rinse your body with warm water from the neck down.  8.  DO NOT shower/wash with your normal soap after using and rinsing off the CHG Soap.            9.  Pat yourself dry with a clean towel.            10.  Wear clean pajamas.            11.  Place clean sheets on your bed the night of your first shower and do not  sleep with pets.  ON THE DAY OF SURGERY : Do not apply any lotions/deodorants the morning of surgery.  Please wear clean clothes to the hospital/surgery center.    FAILURE TO FOLLOW THESE INSTRUCTIONS MAY RESULT IN THE CANCELLATION OF YOUR SURGERY  PATIENT SIGNATURE_________________________________  NURSE SIGNATURE__________________________________  ________________________________________________________________________      [name]    An incentive spirometer is a tool that can help keep your lungs clear and active. This tool measures how well you are filling your lungs with each breath. Taking long deep breaths may help reverse or decrease the chance of developing breathing (pulmonary) problems (especially infection) following: A long period of time when you are unable to move or be active. BEFORE THE  PROCEDURE  If the spirometer includes an indicator to show your best effort, your nurse or respiratory therapist will set it to a desired goal. If possible, sit up straight or lean slightly forward. Try not to slouch. Hold the incentive spirometer in an upright position. INSTRUCTIONS FOR USE  Sit on the edge of your bed if possible, or sit up as far as you can in bed or on a chair. Hold the incentive spirometer in an upright position. Breathe out normally. Place the mouthpiece in your mouth and seal your lips tightly around it. Breathe in slowly and as deeply as possible, raising the piston or the ball toward the top of the column. Hold your breath for 3-5 seconds or for as long as possible. Allow the piston or ball to fall to the bottom of the column. Remove the mouthpiece from your mouth and breathe out  normally. Rest for a few seconds and repeat Steps 1 through 7 at least 10 times every 1-2 hours when you are awake. Take your time and take a few normal breaths between deep breaths. The spirometer may include an indicator to show your best effort. Use the indicator as a goal to work toward during each repetition. After each set of 10 deep breaths, practice coughing to be sure your lungs are clear. If you have an incision (the cut made at the time of surgery), support your incision when coughing by placing a pillow or rolled up towels firmly against it. Once you are able to get out of bed, walk around indoors and cough well. You may stop using the incentive spirometer when instructed by your caregiver.  RISKS AND COMPLICATIONS Take your time so you do not get dizzy or light-headed. If you are in pain, you may need to take or ask for pain medication before doing incentive spirometry. It is harder to take a deep breath if you are having pain. AFTER USE Rest and breathe slowly and easily. It can be helpful to keep track of a log of your progress. Your caregiver can provide you with a simple table  to help with this. If you are using the spirometer at home, follow these instructions: [place] IF:  You are having difficultly using the spirometer. You have trouble using the spirometer as often as instructed. Your pain medication is not giving enough relief while using the spirometer. You develop fever of 100.5 F (38.1 C) or higher.                                                                                                    SEEK IMMEDIATE MEDICAL CARE IF:  You cough up bloody sputum that had not been present before. You develop fever of 102 F (38.9 C) or greater. You develop worsening pain at or near the incision site. MAKE SURE YOU:  Understand these instructions. Will watch your condition. Will get help right away if you are not doing well or get worse. Document Released: 03/29/2007 Document Revised: 02/08/2012 Document Reviewed: 05/30/2007 [employer] Patient Information 2014 [place], [place].

## 2023-01-06 ENCOUNTER — Encounter (HOSPITAL_COMMUNITY): Payer: Self-pay

## 2023-01-06 ENCOUNTER — Other Ambulatory Visit: Payer: Self-pay

## 2023-01-06 ENCOUNTER — Encounter (HOSPITAL_COMMUNITY)
Admission: RE | Admit: 2023-01-06 | Discharge: 2023-01-06 | Disposition: A | Discharge status: Home / Self Care | Payer: Medicare HMO | Source: Ambulatory Visit | Attending: Orthopedic Surgery | Admitting: Orthopedic Surgery

## 2023-01-06 VITALS — BP 135/62 | HR 86 | Temp 98.2°F | Resp 16 | Ht 63.0 in | Wt 193.0 lb

## 2023-01-06 DIAGNOSIS — Z01812 Encounter for preprocedural laboratory examination: Secondary | ICD-10-CM | POA: Diagnosis not present

## 2023-01-06 DIAGNOSIS — Z01818 Encounter for other preprocedural examination: Secondary | ICD-10-CM

## 2023-01-06 DIAGNOSIS — I10 Essential (primary) hypertension: Secondary | ICD-10-CM | POA: Diagnosis not present

## 2023-01-06 HISTORY — DX: Malignant (primary) neoplasm, unspecified: C80.1

## 2023-01-06 LAB — CBC
HCT: 46 % (ref 36.0–46.0)
Hemoglobin: 15.2 g/dL — ABNORMAL HIGH (ref 12.0–15.0)
MCH: 32.6 pg (ref 26.0–34.0)
MCHC: 33 g/dL (ref 30.0–36.0)
MCV: 98.7 fL (ref 80.0–100.0)
Platelets: 300 10*3/uL (ref 150–400)
RBC: 4.66 MIL/uL (ref 3.87–5.11)
RDW: 12.6 % (ref 11.5–15.5)
WBC: 10.9 10*3/uL — ABNORMAL HIGH (ref 4.0–10.5)
nRBC: 0 % (ref 0.0–0.2)

## 2023-01-06 LAB — COMPREHENSIVE METABOLIC PANEL
ALT: 24 U/L (ref 0–44)
AST: 33 U/L (ref 15–41)
Albumin: 3.9 g/dL (ref 3.5–5.0)
Alkaline Phosphatase: 67 U/L (ref 38–126)
Anion gap: 13 (ref 5–15)
BUN: 13 mg/dL (ref 8–23)
CO2: 30 mmol/L (ref 22–32)
Calcium: 9 mg/dL (ref 8.9–10.3)
Chloride: 98 mmol/L (ref 98–111)
Creatinine, Ser: 0.81 mg/dL (ref 0.44–1.00)
GFR, Estimated: >60 mL/min (ref >60)
Glucose, Bld: 163 mg/dL — ABNORMAL HIGH (ref 70–99)
Potassium: 4.1 mmol/L (ref 3.5–5.1)
Sodium: 141 mmol/L (ref 135–145)
Total Bilirubin: 1 mg/dL (ref 0.3–1.2)
Total Protein: 7 g/dL (ref 6.5–8.1)

## 2023-01-06 LAB — SURGICAL PCR SCREEN
MRSA, PCR: NEGATIVE
Staphylococcus aureus: NEGATIVE

## 2023-01-07 ENCOUNTER — Ambulatory Visit (HOSPITAL_COMMUNITY): Payer: Medicare HMO | Admitting: Physician Assistant

## 2023-01-11 ENCOUNTER — Telehealth: Payer: Self-pay

## 2023-01-11 NOTE — Telephone Encounter (Signed)
Pt called and LVM reporting discomfort and bruising to lumpectomy site. Attempted to reutn pt's call ti advise contacting surgeon's office for further evaluation. LVM for call back,

## 2023-01-12 ENCOUNTER — Telehealth: Payer: Self-pay | Admitting: *Deleted

## 2023-01-12 ENCOUNTER — Encounter: Payer: Self-pay | Admitting: *Deleted

## 2023-01-12 ENCOUNTER — Other Ambulatory Visit: Payer: Self-pay

## 2023-01-12 ENCOUNTER — Inpatient Hospital Stay: Payer: Medicare HMO | Attending: Hematology and Oncology | Admitting: Physician Assistant

## 2023-01-12 VITALS — BP 146/57 | HR 90 | Temp 97.9°F | Resp 18 | Wt 194.7 lb

## 2023-01-12 DIAGNOSIS — N644 Mastodynia: Secondary | ICD-10-CM | POA: Diagnosis not present

## 2023-01-12 DIAGNOSIS — Z79811 Long term (current) use of aromatase inhibitors: Secondary | ICD-10-CM | POA: Diagnosis not present

## 2023-01-12 DIAGNOSIS — Z170 Estrogen receptor positive status [ER+]: Secondary | ICD-10-CM | POA: Diagnosis not present

## 2023-01-12 DIAGNOSIS — C50412 Malignant neoplasm of upper-outer quadrant of left female breast: Secondary | ICD-10-CM

## 2023-01-12 DIAGNOSIS — Z87891 Personal history of nicotine dependence: Secondary | ICD-10-CM | POA: Insufficient documentation

## 2023-01-12 LAB — CBC WITH DIFFERENTIAL (CANCER CENTER ONLY)
Abs Immature Granulocytes: 0.03 10*3/uL (ref 0.00–0.07)
Basophils Absolute: 0.1 10*3/uL (ref 0.0–0.1)
Basophils Relative: 1 %
Eosinophils Absolute: 0.2 10*3/uL (ref 0.0–0.5)
Eosinophils Relative: 1 %
HCT: 45.8 % (ref 36.0–46.0)
Hemoglobin: 16 g/dL — ABNORMAL HIGH (ref 12.0–15.0)
Immature Granulocytes: 0 %
Lymphocytes Relative: 21 %
Lymphs Abs: 2.4 10*3/uL (ref 0.7–4.0)
MCH: 33.2 pg (ref 26.0–34.0)
MCHC: 34.9 g/dL (ref 30.0–36.0)
MCV: 95 fL (ref 80.0–100.0)
Monocytes Absolute: 0.8 10*3/uL (ref 0.1–1.0)
Monocytes Relative: 7 %
Neutro Abs: 7.6 10*3/uL (ref 1.7–7.7)
Neutrophils Relative %: 70 %
Platelet Count: 302 10*3/uL (ref 150–400)
RBC: 4.82 MIL/uL (ref 3.87–5.11)
RDW: 12.5 % (ref 11.5–15.5)
WBC Count: 11.1 10*3/uL — ABNORMAL HIGH (ref 4.0–10.5)
nRBC: 0 % (ref 0.0–0.2)

## 2023-01-12 NOTE — Progress Notes (Signed)
Symptom Management Consult note [facility]    Patient Care Team: Asencion Noble, MD as PCP - General (Internal Medicine) Stanford Breed Denice Bors, MD as PCP - Cardiology (Cardiology) Elsie Stain, MD as Attending Physician (Pulmonary Disease) Rockwell Germany, RN as Oncology Nurse Navigator Mauro Kaufmann, RN as Oncology Nurse Navigator Erroll Luna, MD as Consulting Physician (General Surgery) Nicholas Lose, MD as Consulting Physician (Hematology and Oncology) Kyung Rudd, MD as Consulting Physician (Radiation Oncology)    Name / MRN / DOB: [name]  [id number]  30-Sep-1946   Date of visit: 01/12/2023   Chief Complaint/Reason for visit: Left breast pain   Current Therapy: Anastrazole     ASSESSMENT & PLAN: Patient is a 77 y.o. female  with oncologic history of Malignant neoplasm of upper-outer quadrant of left breast in female, estrogen receptor positive  followed by Dr. Lindi Adie.  I have viewed most recent oncology note and lab work.    #Malignant neoplasm of upper-outer quadrant of left breast in female, estrogen receptor positive  -Had Left breast seed localized lumpectomy  on 11/04/22 with Dr. Brantley Stage - Next appointment with oncologist is 02/03/23   #Left breast pain -Patient is well-appearing.  Breast exam without palpable mass or abnormality.  Lumpectomy incision healing well with faint bruise over it. -As patient reported abnormal bruising CBC was collected.  Hemoglobin is 16 and platelets 302k, both are consistent with previous labs. -Pain is typical of healing from lumpectomy. No indication for imaging at this time.  Strict ED precautions discussed should symptoms worsen.       Heme/Onc History: Oncology History  Malignant neoplasm of upper-outer quadrant of left breast in female, estrogen receptor positive ([place])  09/18/2022 Initial Diagnosis   Screening mammogram detected left breast mass at 2 o'clock position measuring 0.5 cm,  axilla negative, biopsy revealed grade 1 IDC with focal extracellular mucin plus grade 1 DCIS ER 100%, PR 99%, Ki-67 5%, HER2 1+, FISH negative   09/30/2022 Cancer Staging   Staging form: Breast, AJCC 8th Edition - Clinical: Stage IA (cT1b, cN0, cM0, G1, ER+, PR+, HER2-) - Signed by Nicholas Lose, MD on 09/30/2022 Histologic grading system: 3 grade system   11/04/2022 Surgery   Left lumpectomy: Grade 1 IDC 0.8 cm, margins negative, ER 100%, PR 99%, HER2 negative 1+, Ki-67 5%       Interval history-: [name] is a 77 y.o. female with oncologic history as above presenting to [facility] today with chief complaint of left breast pain and bruising x 3 weeks.  Patient presents unaccompanied to clinic today.  Patient states she is undergoing a right shoulder replacement at the end of the month and wanted to make sure from a healing standpoint she was doing okay from her lumpectomy.  Patient had lumpectomy on 11/04/22 with Dr. Brantley Stage.  She states procedure went well and she had minimal postoperative pain.  She was able to manage it with Tylenol.  The pain she is currently experiencing is a shooting pain from her incision site.  The pain is very brief and lasts only seconds.  She denies any trauma to that area after surgery.  She denies any pain currently.  She notes that after the surgery she had typical postoperative bruising that resolved over time.  The bruising she is currently noticing is over her incision site and she wants to make sure this is normal.  Bruising is yellow in color. No medications taken prior to clinic arrival  today.  Patient denies any fever, chills, nipple changes or discharge.      ROS  All other systems are reviewed and are negative for acute change except as noted in the HPI.    Allergies  Allergen Reactions   Codeine     Neurological problems - hallucinations, "spacey"     Past Medical History:  Diagnosis Date   Cancer ([place])    Left breast cancer   Diastolic  congestive heart failure ([place])    02/05/15  states no fluid in extremities, breathing same as has been   GERD (gastroesophageal reflux disease)    Hepatic steatosis    History of blood transfusion    Hx of cholecystectomy    Interstitial lung disease (HCC)    Osteoarthritis    Pulmonary fibrosis (HCC)    Raynaud disease    Rheumatoid arthritis(714.0)    Scleroderma (HCC)    Seasonal allergies    Shortness of breath dyspnea    states normal for her   Systemic sclerosis [facility])      Past Surgical History:  Procedure Laterality Date   ABDOMINAL HYSTERECTOMY     BLADDER SUSPENSION     BREAST BIOPSY  11/03/2022   MM LT RADIOACTIVE SEED LOC MAMMO GUIDE 11/03/2022 [facility] MAMMOGRAPHY   BREAST LUMPECTOMY WITH RADIOACTIVE SEED LOCALIZATION Left 11/04/2022   Procedure: LEFT BREAST LUMPECTOMY WITH RADIOACTIVE SEED LOCALIZATION;  Surgeon: Erroll Luna, MD;  Location: [place];  Service: General;  Laterality: Left;   CARPAL TUNNEL RELEASE Right 2007   EYE SURGERY Bilateral 2023   cataract w/ IOL   LAPAROSCOPIC CHOLECYSTECTOMY     LEFT HEART CATHETERIZATION WITH CORONARY/GRAFT ANGIOGRAM  03/15/2014   Procedure: LEFT HEART CATHETERIZATION WITH Beatrix Fetters;  Surgeon: Burnell Blanks, MD;  Location: [facility] LAB;  Service: Cardiovascular;;   TONSILLECTOMY     TOTAL KNEE ARTHROPLASTY Left 02/11/2015   Procedure: LEFT TOTAL KNEE ARTHROPLASTY, RIGHT KNEE CORTISONE INJECTION;  Surgeon: Gaynelle Arabian, MD;  [facility];  Service: Orthopedics;  Laterality: Left;   TOTAL KNEE ARTHROPLASTY Right 10/05/2016   Procedure: RIGHT TOTAL KNEE ARTHROPLASTY;  Surgeon: Gaynelle Arabian, MD;  [facility];  Service: Orthopedics;  Laterality: Right;   VESICOVAGINAL FISTULA CLOSURE W/ TAH  11/30/1985    Social History   Socioeconomic History   Marital status: Married    Spouse name: Broadus John   Number of children: 3   Years of education: Not on file   Highest education level: Some college,  no degree  Occupational History   Occupation: Optician, dispensing: [employer]  Tobacco Use   Smoking status: Former    Packs/day: 1.00    Years: 20.00    Total pack years: 20.00    Types: Cigarettes    Quit date: 11/30/1988    Years since quitting: 34.1   Smokeless tobacco: Never  Vaping Use   Vaping Use: Never used  Substance and Sexual Activity   Alcohol use: No   Drug use: No   Sexual activity: Not Currently  Other Topics Concern   Not on file  Social History Narrative   Not on file   Social Determinants of Health   Financial Resource Strain: Not on file  Food Insecurity: Not on file  Transportation Needs: Not on file  Physical Activity: Not on file  Stress: Not on file  Social Connections: Not on file  Intimate Partner Violence: Not on file    Family History  Problem Relation  Age of Onset   Stroke Mother    Heart disease Mother        CABG at age 36   Heart attack Father        Died of MI at age 75   Emphysema Father    Dementia Brother    Osteoarthritis Other    Celiac disease Other      Current Outpatient Medications:    acetaminophen (TYLENOL) 500 MG tablet, Take 1,000 mg by mouth every 6 (six) hours as needed (pain.)., Disp: , Rfl:    albuterol (VENTOLIN HFA) 108 (90 Base) MCG/ACT inhaler, INHALE 2 PUFFS INTO THE LUNGS EVERY 6 (SIX) HOURS AS NEEDED FOR WHEEZING., Disp: 1 each, Rfl: 1   amLODipine (NORVASC) 5 MG tablet, Take 5 mg by mouth in the morning., Disp: , Rfl:    anastrozole (ARIMIDEX) 1 MG tablet, Take 1 tablet (1 mg total) by mouth daily., Disp: 90 tablet, Rfl: 3   aspirin EC 81 MG tablet, Take 81 mg by mouth at bedtime., Disp: , Rfl:    benzonatate (TESSALON) 100 MG capsule, Take 200 mg by mouth daily as needed for cough., Disp: , Rfl:    furosemide (LASIX) 40 MG tablet, TAKE 2 TABLETS EVERY DAY (NEED MD APPOINTMENT FOR REFILLS), Disp: 60 tablet, Rfl: 3   Homeopathic Products (ZINC) LOZG, Take 1 tablet by mouth daily as  needed (immune support)., Disp: , Rfl:    hydroxychloroquine (PLAQUENIL) 200 MG tablet, Take 400 mg by mouth in the morning., Disp: , Rfl:    ipratropium (ATROVENT) 0.06 % nasal spray, Place 1 spray into both nostrils in the morning and at bedtime., Disp: , Rfl:    Lidocaine-Adhesive Sheets (LIDOPURE PATCH EX), Place 1 patch onto the skin daily as needed (pain). OTC, Disp: , Rfl:    nortriptyline (PAMELOR) 10 MG capsule, Take 30 mg by mouth at bedtime., Disp: , Rfl:    oxyCODONE (OXY IR/ROXICODONE) 5 MG immediate release tablet, Take 1 tablet (5 mg total) by mouth every 6 (six) hours as needed for severe pain. (Patient not taking: Reported on 01/04/2023), Disp: 15 tablet, Rfl: 0   pantoprazole (PROTONIX) 40 MG tablet, Take 40 mg by mouth 2 (two) times daily., Disp: , Rfl:    Povidone, PF, (IVIZIA DRY EYES) 0.5 % SOLN, Place 1-2 drops into both eyes 3 (three) times daily as needed (dry/irritated eyes.)., Disp: , Rfl:    predniSONE (DELTASONE) 5 MG tablet, Take 5 mg by mouth in the morning., Disp: , Rfl:    zolpidem (AMBIEN) 10 MG tablet, Take 10 mg by mouth at bedtime as needed for sleep., Disp: , Rfl:   PHYSICAL EXAM: ECOG FS:1 - Symptomatic but completely ambulatory    Vitals:   01/12/23 1341  BP: (!) 146/57  Pulse: 90  Resp: 18  Temp: 97.9 F (36.6 C)  SpO2: 93%  Weight: 194 lb 11.2 oz (88.3 kg)   Physical Exam Vitals and nursing note reviewed.  Constitutional:      Appearance: She is well-developed. She is not ill-appearing or toxic-appearing.  HENT:     Head: Normocephalic.     Nose: Nose normal.  Eyes:     Conjunctiva/sclera: Conjunctivae normal.  Neck:     Vascular: No JVD.  Cardiovascular:     Rate and Rhythm: Normal rate and regular rhythm.     Pulses: Normal pulses.     Heart sounds: Normal heart sounds.  Pulmonary:     Effort: Pulmonary effort is normal.  Breath sounds: Normal breath sounds.  Chest:  Breasts:    Left: No swelling, bleeding, inverted nipple,  mass or nipple discharge.     Comments: Well healed left lumpectomy incision with faint overlying yellow bruising. Mild tenderness to palpation of incision. No induration or palpable fluctuance.  No palpable masses in left breast. No axillary lymphadenopathy Abdominal:     General: There is no distension.  Musculoskeletal:     Cervical back: Normal range of motion.     Comments: No swelling noted to left upper extremity. Soft compartments.  Skin:    General: Skin is warm and dry.  Neurological:     Mental Status: She is oriented to person, place, and time.        LABORATORY DATA: I have reviewed the data as listed    Latest Ref Rng & Units 01/12/2023    1:58 PM 01/06/2023    1:43 PM 10/30/2022    2:49 PM  CBC  WBC 4.0 - 10.5 K/uL 11.1  10.9  14.1   Hemoglobin 12.0 - 15.0 g/dL 16.0  15.2  16.0   Hematocrit 36.0 - 46.0 % 45.8  46.0  45.5   Platelets 150 - 400 K/uL 302  300  343         Latest Ref Rng & Units 01/06/2023    1:43 PM 11/04/2022   12:14 PM 09/30/2022   12:03 PM  CMP  Glucose 70 - 99 mg/dL 163  129  154   BUN 8 - 23 mg/dL 13  12  12   $ Creatinine 0.44 - 1.00 mg/dL 0.81  0.85  0.83   Sodium 135 - 145 mmol/L 141  139  141   Potassium 3.5 - 5.1 mmol/L 4.1  3.5  3.8   Chloride 98 - 111 mmol/L 98  100  101   CO2 22 - 32 mmol/L 30  28  35   Calcium 8.9 - 10.3 mg/dL 9.0  8.8  9.3   Total Protein 6.5 - 8.1 g/dL 7.0  6.7  7.3   Total Bilirubin 0.3 - 1.2 mg/dL 1.0  1.5  0.9   Alkaline Phos 38 - 126 U/L 67  93  92   AST 15 - 41 U/L 33  39  29   ALT 0 - 44 U/L 24  25  24        $ RADIOGRAPHIC STUDIES (from last 24 hours if applicable) I have personally reviewed the radiological images as listed and agreed with the findings in the report. No results found.      Visit Diagnosis: 1. Malignant neoplasm of upper-outer quadrant of left breast in female, estrogen receptor positive ([place])      Orders Placed This Encounter  Procedures   CBC with Differential ([place] Only)    Standing Status:   Future    Number of Occurrences:   1    Standing Expiration Date:   01/13/2024    All questions were answered. The patient knows to call the clinic with any problems, questions or concerns. No barriers to learning was detected.  I have spent a total of 20 minutes minutes of face-to-face and non-face-to-face time, preparing to see the patient, obtaining and/or reviewing separately obtained history, performing a medically appropriate examination, counseling and educating the patient, ordering tests, documenting clinical information in the electronic health record.    Thank you for allowing me to participate in the care of this patient.    Barrie Folk,  PA-C Department of Hematology/Oncology [place] at [facility] Phone: [phone]  [phone]    01/12/2023 4:04 PM

## 2023-01-12 NOTE — Progress Notes (Signed)
Per pt request orders faxed to [facility] for post lumpectomy bra's.

## 2023-01-12 NOTE — Telephone Encounter (Signed)
Received call from pt with complaint of left breast pain and bruising x several days.  Pt denies recent injury or trauma.  Per MD pt needing [facility] visit for further evaluation and tx.  Appt scheduled, pt verbalized understanding of appt details.

## 2023-01-21 ENCOUNTER — Other Ambulatory Visit: Payer: Self-pay

## 2023-01-21 ENCOUNTER — Encounter (HOSPITAL_COMMUNITY): Payer: Self-pay | Admitting: Orthopedic Surgery

## 2023-01-21 ENCOUNTER — Ambulatory Visit (HOSPITAL_COMMUNITY)
Admission: RE | Admit: 2023-01-21 | Discharge: 2023-01-21 | Disposition: A | Discharge status: Home / Self Care | Payer: Medicare HMO | Source: Ambulatory Visit | Attending: Orthopedic Surgery | Admitting: Orthopedic Surgery

## 2023-01-21 ENCOUNTER — Telehealth: Payer: Self-pay | Admitting: Cardiology

## 2023-01-21 ENCOUNTER — Encounter (HOSPITAL_COMMUNITY)
Admission: RE | Disposition: A | Discharge status: Home / Self Care | Payer: Self-pay | Source: Ambulatory Visit | Attending: Orthopedic Surgery

## 2023-01-21 ENCOUNTER — Telehealth: Payer: Self-pay | Admitting: *Deleted

## 2023-01-21 DIAGNOSIS — K219 Gastro-esophageal reflux disease without esophagitis: Secondary | ICD-10-CM | POA: Diagnosis not present

## 2023-01-21 DIAGNOSIS — S46011A Strain of muscle(s) and tendon(s) of the rotator cuff of right shoulder, initial encounter: Secondary | ICD-10-CM | POA: Diagnosis not present

## 2023-01-21 DIAGNOSIS — W19XXXA Unspecified fall, initial encounter: Secondary | ICD-10-CM | POA: Diagnosis not present

## 2023-01-21 DIAGNOSIS — I5032 Chronic diastolic (congestive) heart failure: Secondary | ICD-10-CM | POA: Diagnosis not present

## 2023-01-21 DIAGNOSIS — Z96611 Presence of right artificial shoulder joint: Secondary | ICD-10-CM | POA: Diagnosis not present

## 2023-01-21 DIAGNOSIS — Z87891 Personal history of nicotine dependence: Secondary | ICD-10-CM | POA: Insufficient documentation

## 2023-01-21 DIAGNOSIS — I4510 Unspecified right bundle-branch block: Secondary | ICD-10-CM | POA: Insufficient documentation

## 2023-01-21 DIAGNOSIS — Z539 Procedure and treatment not carried out, unspecified reason: Secondary | ICD-10-CM | POA: Diagnosis not present

## 2023-01-21 SURGERY — ARTHROPLASTY, SHOULDER, TOTAL, REVERSE
Anesthesia: General | Site: Shoulder | Laterality: Right

## 2023-01-21 MED ORDER — CHLORHEXIDINE GLUCONATE 0.12 % MT SOLN
15.0000 mL | Freq: Once | OROMUCOSAL | Status: AC
  Administered 2023-01-21: 15 mL via OROMUCOSAL

## 2023-01-21 MED ORDER — ORAL CARE MOUTH RINSE: 15.0000 mL | Freq: Once | OROMUCOSAL | Status: AC

## 2023-01-21 MED ORDER — LACTATED RINGERS IV SOLN: INTRAVENOUS | Status: DC

## 2023-01-21 MED ORDER — MIDAZOLAM HCL 2 MG/2ML IJ SOLN
1.0000 mg | INTRAMUSCULAR | Status: DC
  Filled 2023-01-21: qty 2

## 2023-01-21 MED ORDER — FENTANYL CITRATE PF 50 MCG/ML IJ SOSY
50.0000 ug | PREFILLED_SYRINGE | INTRAMUSCULAR | Status: DC
  Filled 2023-01-21: qty 2

## 2023-01-21 MED ORDER — TRANEXAMIC ACID-NACL 1000-0.7 MG/100ML-% IV SOLN
1000.0000 mg | INTRAVENOUS | Status: DC
  Filled 2023-01-21: qty 100

## 2023-01-21 MED ORDER — CEFAZOLIN SODIUM-DEXTROSE 2-4 GM/100ML-% IV SOLN
2.0000 g | INTRAVENOUS | Status: DC
  Filled 2023-01-21: qty 100

## 2023-01-21 SURGICAL SUPPLY — 50 items
BAG COUNTER SPONGE SURGICOUNT (BAG) IMPLANT
BAG ZIPLOCK 12X15 (MISCELLANEOUS) ×1 IMPLANT
BLADE SAW SGTL 83.5X18.5 (BLADE) ×1 IMPLANT
BNDG COHESIVE 4X5 TAN STRL LF (GAUZE/BANDAGES/DRESSINGS) ×1 IMPLANT
COOLER ICEMAN CLASSIC (MISCELLANEOUS) ×1 IMPLANT
COVER BACK TABLE 60X90IN (DRAPES) ×1 IMPLANT
COVER SURGICAL LIGHT HANDLE (MISCELLANEOUS) ×1 IMPLANT
DERMABOND ADVANCED .7 DNX12 (GAUZE/BANDAGES/DRESSINGS) ×1 IMPLANT
DRAPE ORTHO SPLIT 77X108 STRL (DRAPES) ×2
DRAPE SHEET LG 3/4 BI-LAMINATE (DRAPES) ×1 IMPLANT
DRAPE SURG 17X11 SM STRL (DRAPES) ×1 IMPLANT
DRAPE SURG ORHT 6 SPLT 77X108 (DRAPES) ×2 IMPLANT
DRAPE TOP 10253 STERILE (DRAPES) ×1 IMPLANT
DRAPE U-SHAPE 47X51 STRL (DRAPES) ×1 IMPLANT
DRESSING AQUACEL AG SP 3.5X6 (GAUZE/BANDAGES/DRESSINGS) ×1 IMPLANT
DRSG AQUACEL AG ADV 3.5X10 (GAUZE/BANDAGES/DRESSINGS) IMPLANT
DRSG AQUACEL AG SP 3.5X6 (GAUZE/BANDAGES/DRESSINGS) ×1
DURAPREP 26ML APPLICATOR (WOUND CARE) ×1 IMPLANT
ELECT BLADE TIP CTD 4 INCH (ELECTRODE) ×1 IMPLANT
ELECT PENCIL ROCKER SW 15FT (MISCELLANEOUS) ×1 IMPLANT
ELECT REM PT RETURN 15FT ADLT (MISCELLANEOUS) ×1 IMPLANT
FACESHIELD WRAPAROUND (MASK) ×5 IMPLANT
GLOVE BIO SURGEON STRL SZ7.5 (GLOVE) ×1 IMPLANT
GLOVE BIO SURGEON STRL SZ8 (GLOVE) ×1 IMPLANT
GLOVE SS BIOGEL STRL SZ 7 (GLOVE) ×1 IMPLANT
GLOVE SS BIOGEL STRL SZ 7.5 (GLOVE) ×1 IMPLANT
GOWN STRL SURGICAL XL XLNG (GOWN DISPOSABLE) ×2 IMPLANT
KIT BASIN OR (CUSTOM PROCEDURE TRAY) ×1 IMPLANT
KIT TURNOVER KIT A (KITS) IMPLANT
MANIFOLD NEPTUNE II (INSTRUMENTS) ×1 IMPLANT
NEEDLE TAPERED W/ NITINOL LOOP (MISCELLANEOUS) ×1 IMPLANT
NS IRRIG 1000ML POUR BTL (IV SOLUTION) ×1 IMPLANT
PACK SHOULDER (CUSTOM PROCEDURE TRAY) ×1 IMPLANT
PAD ARMBOARD 7.5X6 YLW CONV (MISCELLANEOUS) ×1 IMPLANT
PAD COLD SHLDR WRAP-ON (PAD) ×1 IMPLANT
PIN NITINOL TARGETER 2.8 (PIN) IMPLANT
PIN SET MODULAR GLENOID SYSTEM (PIN) IMPLANT
RESTRAINT HEAD UNIVERSAL NS (MISCELLANEOUS) ×1 IMPLANT
SLING ARM FOAM STRAP LRG (SOFTGOODS) IMPLANT
SLING ARM FOAM STRAP MED (SOFTGOODS) IMPLANT
SUT MNCRL AB 3-0 PS2 18 (SUTURE) ×1 IMPLANT
SUT MON AB 2-0 CT1 36 (SUTURE) ×1 IMPLANT
SUT VIC AB 1 CT1 36 (SUTURE) ×1 IMPLANT
SUTURE TAPE 1.3 40 TPR END (SUTURE) ×2 IMPLANT
SUTURETAPE 1.3 40 TPR END (SUTURE) ×2
TOWEL OR 17X26 10 PK STRL BLUE (TOWEL DISPOSABLE) ×1 IMPLANT
TOWEL OR NON WOVEN STRL DISP B (DISPOSABLE) ×1 IMPLANT
TUBE SUCTION HIGH CAP CLEAR NV (SUCTIONS) ×1 IMPLANT
TUBING CONNECTING 10 (TUBING) IMPLANT
WATER STERILE IRR 1000ML POUR (IV SOLUTION) ×2 IMPLANT

## 2023-01-21 NOTE — Progress Notes (Signed)
Anesthesia note:  Pt examined in preop. She states her breathing is okay as long as she doesn't exert herself. She also had bilateral rales. I turned on her monitor to see what her SpO2 was and it was ~97% on RA, but her QRS was widened. I requested EKG which showed RBBB. While not typically an indication of CAD or ACS, she has not seen her cardiologist in > 1 yr and this is an acute change from her most recent EKG (10/30/2022) and all previous that I reviewed back to 2017. Given her multiple comorbidities, this acute change make me concerned for her tolerating interscalene block and sitting position in the OR. I recommend she be seen by her cardiologist and have a clear recommendation from her pulmonologist regarding preoperative block due to her lung disease.

## 2023-01-21 NOTE — Progress Notes (Signed)
Addendum:  During preop evaluation today EKG noted the new onset of right bundle branch block.  Anesthesia has recommended evaluation with cardiology prior to proceeding with surgery.  I have shared this information with Ms. Boeckman and her husband.  They are in complete agreement.  We will facilitate a follow-up appointment with her cardiologist Dr. Stanford Breed for further evaluation and will anticipate rescheduling surgery once the workup and cardiac clearance is completed.  Marin Shutter MD

## 2023-01-21 NOTE — Telephone Encounter (Signed)
Per Dr. Onnie Graham: "During preop evaluation today EKG noted the new onset of right bundle branch block. Anesthesia has recommended evaluation with cardiology prior to proceeding with surgery. I have shared this information with [name] and her husband. They are in complete agreement. We will facilitate a follow-up appointment with her cardiologist Dr. Stanford Breed for further evaluation and will anticipate rescheduling surgery once the workup and cardiac clearance is completed."   Made appointment with CGilford Rile for 2/26.

## 2023-01-21 NOTE — H&P (Signed)
[name]    Chief Complaint: Right shoulder rotator cuff tear arthropathy HPI: The patient is a 77 y.o. female with chronic severe right shoulder pain and profoundly limited motion after a fall sustaining massive irreparable rotator cuff tear.  Patient presents today for planned right shoulder reverse arthroplasty.  Past Medical History:  Diagnosis Date   Cancer [facility])    Left breast cancer   Diastolic congestive heart failure ([place])    02/05/15  states no fluid in extremities, breathing same as has been   GERD (gastroesophageal reflux disease)    Hepatic steatosis    History of blood transfusion    Hx of cholecystectomy    Interstitial lung disease (HCC)    Osteoarthritis    Pulmonary fibrosis (HCC)    Raynaud disease    Rheumatoid arthritis(714.0)    Scleroderma (HCC)    Seasonal allergies    Shortness of breath dyspnea    states normal for her   Systemic sclerosis [facility])       Past Surgical History:  Procedure Laterality Date   ABDOMINAL HYSTERECTOMY     BLADDER SUSPENSION     BREAST BIOPSY  11/03/2022   MM LT RADIOACTIVE SEED LOC MAMMO GUIDE 11/03/2022 [facility] MAMMOGRAPHY   BREAST LUMPECTOMY WITH RADIOACTIVE SEED LOCALIZATION Left 11/04/2022   Procedure: LEFT BREAST LUMPECTOMY WITH RADIOACTIVE SEED LOCALIZATION;  Surgeon: Erroll Luna, MD;  Location: [place];  Service: General;  Laterality: Left;   CARPAL TUNNEL RELEASE Right 2007   EYE SURGERY Bilateral 2023   cataract w/ IOL   LAPAROSCOPIC CHOLECYSTECTOMY     LEFT HEART CATHETERIZATION WITH CORONARY/GRAFT ANGIOGRAM  03/15/2014   Procedure: LEFT HEART CATHETERIZATION WITH Beatrix Fetters;  Surgeon: Burnell Blanks, MD;  Location[facility] CATH LAB;  Service: Cardiovascular;;   TONSILLECTOMY     TOTAL KNEE ARTHROPLASTY Left 02/11/2015   Procedure: LEFT TOTAL KNEE ARTHROPLASTY, RIGHT KNEE CORTISONE INJECTION;  Surgeon: Gaynelle Arabian, MD;  [facility];  Service: Orthopedics;  Laterality: Left;   TOTAL  KNEE ARTHROPLASTY Right 10/05/2016   Procedure: RIGHT TOTAL KNEE ARTHROPLASTY;  Surgeon: Gaynelle Arabian, MD;  [facility];  Service: Orthopedics;  Laterality: Right;   VESICOVAGINAL FISTULA CLOSURE W/ TAH  11/30/1985    Family History  Problem Relation Age of Onset   Stroke Mother    Heart disease Mother        CABG at age 15   Heart attack Father        Died of MI at age 16   Emphysema Father    Dementia Brother    Osteoarthritis Other    Celiac disease Other     Social History:  reports that she quit smoking about 34 years ago. Her smoking use included cigarettes. She has a 20.00 pack-year smoking history. She has never used smokeless tobacco. She reports that she does not drink alcohol and does not use drugs.  BMI: Estimated body mass index is 34.37 kg/m as calculated from the following:   Height as of this encounter: 5' 3"$  (1.6 m).   Weight as of this encounter: 88 kg.  Lab Results  Component Value Date   ALBUMIN 3.9 01/06/2023   Diabetes: Patient does not have a diagnosis of diabetes.     Smoking Status:       Medications Prior to Admission  Medication Sig Dispense Refill   acetaminophen (TYLENOL) 500 MG tablet Take 1,000 mg by mouth every 6 (six) hours as needed (pain.).  amLODipine (NORVASC) 5 MG tablet Take 5 mg by mouth in the morning.     aspirin EC 81 MG tablet Take 81 mg by mouth at bedtime.     furosemide (LASIX) 40 MG tablet TAKE 2 TABLETS EVERY DAY (NEED MD APPOINTMENT FOR REFILLS) 60 tablet 3   hydroxychloroquine (PLAQUENIL) 200 MG tablet Take 400 mg by mouth in the morning.     ipratropium (ATROVENT) 0.06 % nasal spray Place 1 spray into both nostrils in the morning and at bedtime.     Lidocaine-Adhesive Sheets (LIDOPURE PATCH EX) Place 1 patch onto the skin daily as needed (pain). OTC     nortriptyline (PAMELOR) 10 MG capsule Take 30 mg by mouth at bedtime.     pantoprazole (PROTONIX) 40 MG tablet Take 40 mg by mouth 2 (two) times daily.      Povidone, PF, (IVIZIA DRY EYES) 0.5 % SOLN Place 1-2 drops into both eyes 3 (three) times daily as needed (dry/irritated eyes.).     predniSONE (DELTASONE) 5 MG tablet Take 5 mg by mouth in the morning.     zolpidem (AMBIEN) 10 MG tablet Take 10 mg by mouth at bedtime as needed for sleep.     albuterol (VENTOLIN HFA) 108 (90 Base) MCG/ACT inhaler INHALE 2 PUFFS INTO THE LUNGS EVERY 6 (SIX) HOURS AS NEEDED FOR WHEEZING. 1 each 1   anastrozole (ARIMIDEX) 1 MG tablet Take 1 tablet (1 mg total) by mouth daily. 90 tablet 3   benzonatate (TESSALON) 100 MG capsule Take 200 mg by mouth daily as needed for cough.     Homeopathic Products (ZINC) LOZG Take 1 tablet by mouth daily as needed (immune support).     oxyCODONE (OXY IR/ROXICODONE) 5 MG immediate release tablet Take 1 tablet (5 mg total) by mouth every 6 (six) hours as needed for severe pain. (Patient not taking: Reported on 01/04/2023) 15 tablet 0     Physical Exam: Right shoulder demonstrates profoundly limited motion with approximately 45 degrees active elevation and global weakness.  Examination otherwise as noted at her recent office visit.  Skin is intact about the right shoulder and she is neurovascular intact in the right upper extremity.  Radiographs  Plain radiographs confirm the right shoulder to be congruently aligned.  Recent MRI scan confirms a massive retracted atrophied rotator cuff tear.  Vitals  Temp:  [98 F (36.7 C)] 98 F (36.7 C) (02/22 0728) Pulse Rate:  [88] 88 (02/22 0728) Resp:  [16] 16 (02/22 0728) BP: (143)/(70) 143/70 (02/22 0728) SpO2:  [96 %] 96 % (02/22 0728) Weight:  [88 kg] 88 kg (02/22 0740)  Assessment/Plan  Impression: Right shoulder rotator cuff tear arthropathy  Plan of Action: Procedure(s): REVERSE SHOULDER ARTHROPLASTY  Randee Upchurch M Merrick Maggio 01/21/2023, 9:48 AM Contact # [phone]

## 2023-01-21 NOTE — Telephone Encounter (Signed)
Pt already has an appointment 01/25/23 with Laurann Montana, NP for surgical clearance..  Will update appointment information.     Pre-operative Risk Assessment    Patient Name: [name]  DOB: [birth year] MRN: [id number]      Request for Surgical Clearance    Procedure:   RT REVERSE SHOLDER ARTHROPLASTY  Date of Surgery:  Clearance TBD                                 Surgeon:  DR. Lennette Bihari SUPPLE Surgeon's Group or Practice Name:  [facility] Phone number:  [phone] Fax number:  [phone]   Type of Clearance Requested:   - Medical  - Pharmacy:  Hold Aspirin NOT INDICATED HOW LONG   Type of Anesthesia:  General    Additional requests/questions:    Astrid Divine   01/21/2023, 1:21 PM

## 2023-01-21 NOTE — Anesthesia Preprocedure Evaluation (Addendum)
Anesthesia Evaluation    Reviewed: Allergy & Precautions, Patient's Chart, lab work & pertinent test results, Unable to perform ROS - Chart review only  History of Anesthesia Complications Negative for: history of anesthetic complications  Airway Mallampati: II  TM Distance: >3 FB Neck ROM: Full    Dental  (+) Missing,    Pulmonary former smoker   Pulmonary exam normal        Cardiovascular hypertension, Pt. on medications pulmonary hypertension+ Peripheral Vascular Disease and +CHF  Normal cardiovascular exam  Echo 06/2022  1. Left ventricular ejection fraction, by estimation, is 60 to 65%. The left ventricle has normal function. The left ventricle has no regional wall motion abnormalities. Left ventricular diastolic parameters are consistent with Grade I diastolic dysfunction (impaired relaxation).   2. Right ventricular systolic function is normal. The right ventricular size is normal. There is normal pulmonary artery systolic pressure.   3. Left atrial size was mildly dilated.   4. The mitral valve is abnormal. No evidence of mitral valve regurgitation. No evidence of mitral stenosis.   5. The aortic valve is tricuspid. Aortic valve regurgitation is not visualized. No aortic stenosis is present.   6. The inferior vena cava is normal in size with greater than 50% respiratory variability, suggesting right atrial pressure of 3 mmHg.     Neuro/Psych negative neurological ROS  negative psych ROS   GI/Hepatic Neg liver ROS,GERD  Medicated and Controlled,,  Endo/Other  negative endocrine ROS    Renal/GU negative Renal ROS     Musculoskeletal  (+) Arthritis ,    Abdominal   Peds  Hematology negative hematology ROS (+)   Anesthesia Other Findings Day of surgery medications reviewed with patient.  Reproductive/Obstetrics negative OB ROS                              Anesthesia Physical Anesthesia  Plan  ASA: 3  Anesthesia Plan: General   Post-op Pain Management: Regional block* and Ofirmev IV (intra-op)*   Induction: Intravenous  PONV Risk Score and Plan: 3 and Treatment may vary due to age or medical condition, Ondansetron and Dexamethasone  Airway Management Planned: Oral ETT  Additional Equipment: None  Intra-op Plan:   Post-operative Plan: Possible Post-op intubation/ventilation  Informed Consent:   Plan Discussed with:   Anesthesia Plan Comments: (PAT note by Karoline Caldwell, PA-C: Pertinent medical history includes interstitial lung disease due to connective tissue disease, pulmonary fibrosis, pulmonary hypertension, Raynaud's disease, chronic diastolic heart failure.  She is followed by pulmonology at [facility].  She was last seen by Geraldo Pitter, NP on 09/28/2022.  Per note, "Patient had overnight oximetry test on 08/27/2022 that showed very mild oxygen desaturation, pulse ox <88% for 5 minutes and 33 seconds. CT chest in October 2023 showed unchanged appearance of lungs, findings considered diagnostic for UIP.  However given stability compared with studies dating back to 2013 alternative diagnoses such as NSIP is of consideration. She is on Plaquenil and prednisone 28m daily for RA, symptoms are well control. She would like to hold off on starting nocturnal oxygen. Recent dx breast cancer, planning for lumpectomy."  No changes were made to management, she is advised to follow-up in 3 months.  Patient evaluated by cardiologist Dr. CStanford BreedMarch 2022 for episode of dizziness/chest tightness.  He ordered nuclear stress test which was low risk, nonischemic.  Recent echo ordered by pulmonology showed EF 60 to [id number] grade 1 diastolic dysfunction,  no significant valvular abnormalities.  Severe hepatic steatosis noted on chest CT. Most recent CMP 09/30/2022 showed hepatic function to be WNL.  Preop labs reviewed, Preop labs reviewed, WBC mildly elevated at 14.1, otherwise  unremarkable.  BMP hemolyzed, will need to be redrawn day of surgery.  EKG 10/30/2022: NSR.  Rate 94.  PFTs 07/27/2022: FVC-%Pred-Pre % 55 FEV1-%Pred-Pre % 56 FEV1FVC-%Pred-Pre % 101 DLCO unc % pred % 77  CT chest 08/31/2022: IMPRESSION: 1. The appearance of the lungs is unchanged compared to the prior study, with a spectrum of findings once again considered diagnostic of usual interstitial pneumonia (UIP) per current ATS guidelines. However, given the stability in the appearance of the lungs compared to remote prior studies dating back to 2013, an alternative diagnosis such as fibrotic phase nonspecific interstitial pneumonia (NSIP) warrants consideration. 2. Aortic atherosclerosis, in addition to three-vessel coronary artery disease. Assessment for potential risk factor modification, dietary therapy or pharmacologic therapy may be warranted, if clinically indicated. 3. There are calcifications of the aortic valve. Echocardiographic correlation for evaluation of potential valvular dysfunction may be warranted if clinically indicated. 4. Cirrhosis with severe hepatic steatosis.  TTE 07/14/2022:  1. Left ventricular ejection fraction, by estimation, is 60 to 65%. The  left ventricle has normal function. The left ventricle has no regional  wall motion abnormalities. Left ventricular diastolic parameters are  consistent with Grade I diastolic  dysfunction (impaired relaxation).   2. Right ventricular systolic function is normal. The right ventricular  size is normal. There is normal pulmonary artery systolic pressure.   3. Left atrial size was mildly dilated.   4. The mitral valve is abnormal. No evidence of mitral valve  regurgitation. No evidence of mitral stenosis.   5. The aortic valve is tricuspid. Aortic valve regurgitation is not  visualized. No aortic stenosis is present.   6. The inferior vena cava is normal in size with greater than 50%  respiratory variability,  suggesting right atrial pressure of 3 mmHg.   Nuclear stress 02/12/2021: ? Nuclear stress EF: 82%. ? There was no ST segment deviation noted during stress. ? No T wave inversion was noted during stress. ? Normal perfusion with no evidence of ischemia or infarction. ? The study is normal. ? This is a low risk study. ? The left ventricular ejection fraction is hyperdynamic (>65%).  )         Anesthesia Quick Evaluation

## 2023-01-21 NOTE — Telephone Encounter (Signed)
   Pt is having a procedure today and they couldn't do the procedure because on her EKG it shows a bundle branch block and its looking abnormal compared to her last EKG. She wanted to see Dr. Stanford Breed. I offered APP but she declined. She said, its important for her to see Dr. Stanford Breed

## 2023-01-25 ENCOUNTER — Encounter (HOSPITAL_BASED_OUTPATIENT_CLINIC_OR_DEPARTMENT_OTHER): Payer: Self-pay | Admitting: Family

## 2023-01-25 ENCOUNTER — Ambulatory Visit (HOSPITAL_BASED_OUTPATIENT_CLINIC_OR_DEPARTMENT_OTHER): Payer: Medicare HMO | Admitting: Family

## 2023-01-25 VITALS — BP 136/78 | HR 88 | Ht 63.0 in | Wt 195.0 lb

## 2023-01-25 DIAGNOSIS — I4510 Unspecified right bundle-branch block: Secondary | ICD-10-CM

## 2023-01-25 DIAGNOSIS — I2510 Atherosclerotic heart disease of native coronary artery without angina pectoris: Secondary | ICD-10-CM | POA: Diagnosis not present

## 2023-01-25 DIAGNOSIS — Z01818 Encounter for other preprocedural examination: Secondary | ICD-10-CM | POA: Diagnosis not present

## 2023-01-25 DIAGNOSIS — I5032 Chronic diastolic (congestive) heart failure: Secondary | ICD-10-CM | POA: Diagnosis not present

## 2023-01-25 DIAGNOSIS — R072 Precordial pain: Secondary | ICD-10-CM | POA: Diagnosis not present

## 2023-01-25 DIAGNOSIS — I2584 Coronary atherosclerosis due to calcified coronary lesion: Secondary | ICD-10-CM | POA: Diagnosis not present

## 2023-01-25 MED ORDER — METOPROLOL TARTRATE 100 MG PO TABS: ORAL_TABLET | ORAL | 0 refills | Status: DC

## 2023-01-25 NOTE — Progress Notes (Signed)
Office Visit    Patient Name: [name] Date of Encounter: 01/25/2023  PCP:  Asencion Noble, [place]  Cardiologist:  Kirk Ruths, MD  Advanced Practice Provider:  No care team member to display Electrophysiologist:  None      Chief Complaint    [name] is a 77 y.o. female presents today for preoperative clearance, new RBBB.   Past Medical History    Past Medical History:  Diagnosis Date   Cancer [facility]    Left breast cancer   Diastolic congestive heart failure ([place])    02/05/15  states no fluid in extremities, breathing same as has been   GERD (gastroesophageal reflux disease)    Hepatic steatosis    History of blood transfusion    Hx of cholecystectomy    Interstitial lung disease (HCC)    Osteoarthritis    Pulmonary fibrosis (HCC)    Raynaud disease    Rheumatoid arthritis(714.0)    Scleroderma (HCC)    Seasonal allergies    Shortness of breath dyspnea    states normal for her   Systemic sclerosis [facility])    Past Surgical History:  Procedure Laterality Date   ABDOMINAL HYSTERECTOMY     BLADDER SUSPENSION     BREAST BIOPSY  11/03/2022   MM LT RADIOACTIVE SEED LOC MAMMO GUIDE 11/03/2022 [facility] MAMMOGRAPHY   BREAST LUMPECTOMY WITH RADIOACTIVE SEED LOCALIZATION Left 11/04/2022   Procedure: LEFT BREAST LUMPECTOMY WITH RADIOACTIVE SEED LOCALIZATION;  Surgeon: Erroll Luna, MD;  Location: [place];  Service: General;  Laterality: Left;   CARPAL TUNNEL RELEASE Right 2007   EYE SURGERY Bilateral 2023   cataract w/ IOL   LAPAROSCOPIC CHOLECYSTECTOMY     LEFT HEART CATHETERIZATION WITH CORONARY/GRAFT ANGIOGRAM  03/15/2014   Procedure: LEFT HEART CATHETERIZATION WITH Beatrix Fetters;  Surgeon: Burnell Blanks, MD;  Location[facility] C[facility] LAB;  Service: Cardiovascular;;   TONSILLECTOMY     TOTAL KNEE ARTHROPLASTY Left 02/11/2015   Procedure: LEFT TOTAL KNEE ARTHROPLASTY, RIGHT KNEE CORTISONE INJECTION;  Surgeon:  Gaynelle Arabian, MD;  Location: [facility];  Service: Orthopedics;  Laterality: Left;   TOTAL KNEE ARTHROPLASTY Right 10/05/2016   Procedure: RIGHT TOTAL KNEE ARTHROPLASTY;  Surgeon: Gaynelle Arabian, MD;  Location: [facility];  Service: Orthopedics;  Laterality: Right;   VESICOVAGINAL FISTULA CLOSURE W/ TAH  11/30/1985    Allergies  Allergies  Allergen Reactions   Codeine     Neurological problems - hallucinations, "spacey"    History of Present Illness    [name] is a 77 y.o. female with a hx of diastolic heart failure, HTN, CREST/scleroderma and ILD, breast cancer last seen 02/03/21 by Dr. Stanford Breed.  Presents today for new RBBB and clearance for right reverse shoulder arthroplasty. Pleasant lady who presents today with her husband. 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 In November she had fall leading to shoulder surgery and also finding of stage 1 breast cancer requiring L breast lumpectomy 11/04/22. She was being pulled back for shoulder surgery when case was cancelled due to new RBBB. Reports her exertional dyspnea is overall stable which she attributes to her scleroderma and ILD. However, does note over the past couple months  getting tired more reasily. Reports no chest pain, pressure, tightness. She will have an occasional flutter or a strong heart beat sensation which is not bothersome.   EKGs/Labs/Other Studies Reviewed:   The following studies were reviewed today:  EKG:  EKG is  ordered today.  The ekg ordered today demonstrates NSR 88bpm with RBBB.   Recent Labs: 01/06/2023: ALT 24; BUN 13; Creatinine, Ser 0.81; Potassium 4.1; Sodium 141 01/12/2023: Hemoglobin 16.0; Platelet Count 302  Recent Lipid Panel    Component Value Date/Time   CHOL 129 02/07/2021 0930   TRIG 99 02/07/2021 0930   HDL 61 02/07/2021 0930   CHOLHDL 2.1 02/07/2021 0930   LDLCALC 50 02/07/2021 0930    Home Medications   Current Meds  Medication Sig   acetaminophen  (TYLENOL) 500 MG tablet Take 1,000 mg by mouth every 6 (six) hours as needed (pain.).   albuterol (VENTOLIN HFA) 108 (90 Base) MCG/ACT inhaler INHALE 2 PUFFS INTO THE LUNGS EVERY 6 (SIX) HOURS AS NEEDED FOR WHEEZING.   amLODipine (NORVASC) 5 MG tablet Take 5 mg by mouth in the morning.   anastrozole (ARIMIDEX) 1 MG tablet Take 1 tablet (1 mg total) by mouth daily.   aspirin EC 81 MG tablet Take 81 mg by mouth at bedtime.   benzonatate (TESSALON) 100 MG capsule Take 200 mg by mouth daily as needed for cough.   furosemide (LASIX) 40 MG tablet TAKE 2 TABLETS EVERY DAY (NEED MD APPOINTMENT FOR REFILLS)   Homeopathic Products (ZINC) LOZG Take 1 tablet by mouth daily as needed (immune support).   hydroxychloroquine (PLAQUENIL) 200 MG tablet Take 400 mg by mouth in the morning.   ipratropium (ATROVENT) 0.06 % nasal spray Place 1 spray into both nostrils in the morning and at bedtime.   Lidocaine-Adhesive Sheets (LIDOPURE PATCH EX) Place 1 patch onto the skin daily as needed (pain). OTC   metoprolol tartrate (LOPRESSOR) 100 MG tablet Take one tablet two hours prior to cardiac CTA.   nortriptyline (PAMELOR) 10 MG capsule Take 30 mg by mouth at bedtime.   oxyCODONE (OXY IR/ROXICODONE) 5 MG immediate release tablet Take 1 tablet (5 mg total) by mouth every 6 (six) hours as needed for severe pain.   pantoprazole (PROTONIX) 40 MG tablet Take 40 mg by mouth 2 (two) times daily.   Povidone, PF, (IVIZIA DRY EYES) 0.5 % SOLN Place 1-2 drops into both eyes 3 (three) times daily as needed (dry/irritated eyes.).   predniSONE (DELTASONE) 5 MG tablet Take 5 mg by mouth in the morning.   zolpidem (AMBIEN) 10 MG tablet Take 10 mg by mouth at bedtime as needed for sleep.     Review of Systems      All other systems reviewed and are otherwise negative except as noted above.  Physical Exam    VS:  BP 136/78   Pulse 88   Ht '5\' 3"'$  (1.6 m)   Wt 195 lb (88.5 kg)   BMI 34.54 kg/m  , BMI Body mass index is 34.54  kg/m.  Wt Readings from Last 3 Encounters:  01/25/23 195 lb (88.5 kg)  01/21/23 194 lb 0.1 oz (88 kg)  01/12/23 194 lb 11.2 oz (88.3 kg)    GEN: Well nourished, well developed, in no acute distress. HEENT: normal. Neck: Supple, no JVD, carotid bruits, or masses. Cardiac: RRR, no murmurs, rubs, or gallops. No clubbing, cyanosis, edema.  Radials/PT 2+ and equal bilaterally.  Respiratory:  Respirations regular and unlabored, clear to auscultation bilaterally. GI: Soft, nontender, nondistended. MS: No deformity or atrophy. Skin: Warm and dry, no rash. Neuro:  Strength and sensation are intact. Psych: Normal affect.  Assessment & Plan    RBBB / Fatigue / Preop Clearance - RBBB  new finding by EKG 01/21/23 while waiting to undergo shoulder surgery which was cancelled. Not on AV nodal blocking agent. No lightheadedness, dizziness.  Reports no chest pain, but does note some activity intolerance and fatigue. Plan for ischemic eval with cardiac CTA. If unremarkable, could proceed with shoulder surgery. Metoprolol '100mg'$  once two hours prior to cardiac CTA. She is aware of use of nitroglycerin during study.   Chronic diastolic heart failure - Euvolemic and well compensated on exam. Continue Lasix '80mg'$  QD.   HTN - BP mildly elevated in clinic. She will monitor at home for 2 weeks and check in via MyChart. Continue Amlodipine '5mg'$  QD. If BP elevated, consider increasing dose.  Palpitations - overall quiescent - not requiring routine AV nodal blocking agent. Reports only a rare sensation of a more forceful heart beat.        Disposition: Follow up in 1 month(s) with Kirk Ruths, MD or APP.  Signed, Loel Dubonnet, NP 01/25/2023, [place]

## 2023-01-25 NOTE — Patient Instructions (Signed)
Medication Instructions:   Recommend taking one tablet of Metoprolol '100mg'$  two hours prior.   *If you need a refill on your cardiac medications before your next appointment, please call your pharmacy*   Lab Work/Testing/Procedures: Your physician has requested that you have cardiac CT. Cardiac computed tomography (CT) is a painless test that uses an x-ray machine to take clear, detailed pictures of your heart. Please follow instruction sheet as given.    Follow-Up: At [facility], you and your health needs are our priority.  As part of our continuing mission to provide you with exceptional heart care, we have created designated Provider Care Teams.  These Care Teams include your primary Cardiologist (physician) and Advanced Practice Providers (APPs -  Physician Assistants and Nurse Practitioners) who all work together to provide you with the care you need, when you need it.  We recommend signing up for the patient portal called "MyChart".  Sign up information is provided on this After Visit Summary.  MyChart is used to connect with patients for Virtual Visits (Telemedicine).  Patients are able to view lab/test results, encounter notes, upcoming appointments, etc.  Non-urgent messages can be sent to your provider as well.   To learn more about what you can do with MyChart, go to [email or web address].    Your next appointment:   1 month(s)  Provider:   Kirk Ruths, MD  or Loel Dubonnet, NP    Other Instructions    Your cardiac CT will be scheduled at one of the below locations:   [facility] [address] [place], [place] [address] [phone]  [facility] [address] [place], [place] [address] [phone]  [facility] [place], [place] [address] [phone]  If scheduled at [facility], please arrive at the [facility] and Children's  Entrance (Entrance C2) of [facility] 30 minutes prior to test start time. You can use the FREE valet parking offered at entrance C (encouraged to control the heart rate for the test)  Proceed to the [facility] Radiology Department (first floor) to check-in and test prep.  All radiology patients and guests should use entrance C2 at [facility], accessed from [facility], even though the hospital's physical address listed is [address].    If scheduled at [facility] or [facility], please arrive 15 mins early for check-in and test prep.   Please follow these instructions carefully (unless otherwise directed):  On the Night Before the Test: Be sure to Drink plenty of water. Do not consume any caffeinated/decaffeinated beverages or chocolate 12 hours prior to your test. Do not take any antihistamines 12 hours prior to your test.   On the Day of the Test: Drink plenty of water until 1 hour prior to the test. Do not eat any food 1 hour prior to test. You may take your regular medications prior to the test.  Take metoprolol (Lopressor) two hours prior to test. If you take Furosemide/Hydrochlorothiazide/Spironolactone, please HOLD on the morning of the test. FEMALES- please wear underwire-free bra if available, avoid dresses & tight clothing       After the Test: Drink plenty of water. After receiving IV contrast, you may experience a mild flushed feeling. This is normal. On occasion, you may experience a mild rash up to 24 hours after the test. This is not dangerous. If  this occurs, you can take Benadryl 25 mg and increase your fluid intake. If you experience trouble breathing, this can be serious. If it is severe call 911 IMMEDIATELY. If it is mild, please call our office. If you take any of these medications: Glipizide/Metformin, Avandament, Glucavance, please do not take 48 hours after completing  test unless otherwise instructed.  We will call to schedule your test 2-4 weeks out understanding that some insurance companies will need an authorization prior to the service being performed.   For non-scheduling related questions, please contact the cardiac imaging nurse navigator should you have any questions/concerns: Marchia Bond, Cardiac Imaging Nurse Navigator Gordy Clement, Cardiac Imaging Nurse Navigator [facility] Direct Office Dial: [phone]   For scheduling needs, including cancellations and rescheduling, please call [place], [phone].

## 2023-02-03 ENCOUNTER — Telehealth: Payer: Self-pay | Admitting: Cardiology

## 2023-02-03 NOTE — Telephone Encounter (Signed)
Dr. Caprice Beaver is calling to talk with DOD about patient of Dr. Stanford Breed.

## 2023-02-03 NOTE — Telephone Encounter (Signed)
Returned call to Dr. Uvaldo Bristle (DOD call)- he was calling regarding pre-authorization for coronary CT - this was ordered recently by Laurann Montana, NP. Ultimately, we were able to determine a symptom that would satisfy coronary testing.  The test will be approved.  Dr. Debara Pickett

## 2023-02-04 ENCOUNTER — Telehealth (HOSPITAL_COMMUNITY): Payer: Self-pay | Admitting: *Deleted

## 2023-02-04 NOTE — Telephone Encounter (Signed)
Attempted to call patient regarding upcoming cardiac CT appointment. °Left message on voicemail with name and callback number ° °Suanne Minahan RN Navigator Cardiac Imaging °[facility] °[phone] Office °[phone] Cell ° °

## 2023-02-05 ENCOUNTER — Ambulatory Visit (HOSPITAL_COMMUNITY)
Admission: RE | Admit: 2023-02-05 | Discharge: 2023-02-05 | Disposition: A | Discharge status: Home / Self Care | Payer: Medicare HMO | Source: Ambulatory Visit | Attending: Family | Admitting: Family

## 2023-02-05 DIAGNOSIS — I4510 Unspecified right bundle-branch block: Secondary | ICD-10-CM | POA: Insufficient documentation

## 2023-02-05 DIAGNOSIS — R072 Precordial pain: Secondary | ICD-10-CM | POA: Insufficient documentation

## 2023-02-05 DIAGNOSIS — I700 Atherosclerosis of aorta: Secondary | ICD-10-CM

## 2023-02-05 DIAGNOSIS — I2584 Coronary atherosclerosis due to calcified coronary lesion: Secondary | ICD-10-CM | POA: Insufficient documentation

## 2023-02-05 DIAGNOSIS — Z01818 Encounter for other preprocedural examination: Secondary | ICD-10-CM | POA: Insufficient documentation

## 2023-02-05 DIAGNOSIS — I2510 Atherosclerotic heart disease of native coronary artery without angina pectoris: Secondary | ICD-10-CM | POA: Diagnosis not present

## 2023-02-05 MED ORDER — NITROGLYCERIN 0.4 MG SL SUBL
0.8000 mg | SUBLINGUAL_TABLET | Freq: Once | SUBLINGUAL | Status: AC
  Administered 2023-02-05: 0.8 mg via SUBLINGUAL

## 2023-02-05 MED ORDER — NITROGLYCERIN 0.4 MG SL SUBL
SUBLINGUAL_TABLET | SUBLINGUAL | Status: AC
  Filled 2023-02-05: qty 2

## 2023-02-05 MED ORDER — IOHEXOL 350 MG/ML SOLN
100.0000 mL | Freq: Once | INTRAVENOUS | Status: AC | PRN
  Administered 2023-02-05: 100 mL via INTRAVENOUS

## 2023-02-08 ENCOUNTER — Encounter (HOSPITAL_BASED_OUTPATIENT_CLINIC_OR_DEPARTMENT_OTHER): Payer: Self-pay

## 2023-02-10 NOTE — Telephone Encounter (Signed)
   Patient Name: [name]  DOB: 06/29/1946 MRN: [id number]  Primary Cardiologist: Kirk Ruths, MD  Chart reviewed as part of pre-operative protocol coverage. Given past medical history and time since last visit, based on ACC/AHA guidelines, [name] is at acceptable risk for the planned procedure without further cardiovascular testing.   Seen in clinic 01/25/23 due to new RBBB with no anginal symptoms. Cardiac CTA 02/05/23 with minimal nonobstructive CAD. No indication for intervention.  She may hold Aspirin 5-7 days prior to procedure if deemed necessary by surgical team.  The patient was advised that if she develops new symptoms prior to surgery to contact our office to arrange for a follow-up visit, and she verbalized understanding.  I will route this recommendation to the requesting party via Epic fax function and remove from pre-op pool.  Please call with questions.  Loel Dubonnet, NP 02/10/2023, 4:39 PM

## 2023-02-11 ENCOUNTER — Telehealth (HOSPITAL_BASED_OUTPATIENT_CLINIC_OR_DEPARTMENT_OTHER): Payer: Self-pay

## 2023-02-11 DIAGNOSIS — Z01818 Encounter for other preprocedural examination: Secondary | ICD-10-CM

## 2023-02-11 DIAGNOSIS — I2510 Atherosclerotic heart disease of native coronary artery without angina pectoris: Secondary | ICD-10-CM

## 2023-02-11 MED ORDER — ROSUVASTATIN CALCIUM 10 MG PO TABS: 10.0000 mg | ORAL_TABLET | Freq: Every day | ORAL | 3 refills | Status: DC

## 2023-02-11 NOTE — Addendum Note (Signed)
Addended by: Gerald Stabs on: 02/11/2023 01:49 PM   Modules accepted: Orders

## 2023-02-11 NOTE — Telephone Encounter (Signed)
Patient is returning call. 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

## 2023-02-11 NOTE — Telephone Encounter (Addendum)
Left message for patient to call back   ----- Message from Loel Dubonnet, NP sent at 02/10/2023  4:39 PM EDT ----- Coronary calcium score of 419 with minimal nonobstructive coronary disease. Not significant enough to cause symptoms. Recommend secondary prevention - continue Aspirin '81mg'$  daily and start Rosuvastatin '10mg'$  daily. FLP/LFT in 6-8 weeks.  May proceed with shoulder surgery. May hold Aspirin 5-7 days prior to surgery.   Routed to surgical team

## 2023-02-11 NOTE — Telephone Encounter (Signed)
Patient returned call to the office, the following results were reviewed. Patient verbalizes understanding and is agreeable to the plan! Rx to mail order pharmacy.   ----- Message from Loel Dubonnet, NP sent at 02/10/2023  4:39 PM EDT ----- Coronary calcium score of 419 with minimal nonobstructive coronary disease. Not significant enough to cause symptoms. Recommend secondary prevention - continue Aspirin '81mg'$  daily and start Rosuvastatin '10mg'$  daily. FLP/LFT in 6-8 weeks.   May proceed with shoulder surgery. May hold Aspirin 5-7 days prior to surgery.    Routed to surgical team

## 2023-02-22 ENCOUNTER — Ambulatory Visit (HOSPITAL_BASED_OUTPATIENT_CLINIC_OR_DEPARTMENT_OTHER): Payer: Medicare HMO | Admitting: Family

## 2023-02-23 ENCOUNTER — Inpatient Hospital Stay: Payer: Medicare HMO | Attending: Hematology and Oncology | Admitting: Adult Health

## 2023-02-23 VITALS — BP 129/58 | HR 93 | Temp 98.4°F | Resp 18 | Ht 63.0 in | Wt 186.0 lb

## 2023-02-23 DIAGNOSIS — Z79811 Long term (current) use of aromatase inhibitors: Secondary | ICD-10-CM | POA: Insufficient documentation

## 2023-02-23 DIAGNOSIS — C50412 Malignant neoplasm of upper-outer quadrant of left female breast: Secondary | ICD-10-CM

## 2023-02-23 DIAGNOSIS — Z170 Estrogen receptor positive status [ER+]: Secondary | ICD-10-CM | POA: Diagnosis not present

## 2023-02-23 NOTE — Progress Notes (Signed)
SURVIVORSHIP VISIT:    BRIEF ONCOLOGIC HISTORY:  Oncology History  Malignant neoplasm of upper-outer quadrant of left breast in female, estrogen receptor positive ([name])  09/18/2022 Initial Diagnosis   Screening mammogram detected left breast mass at 2 o'clock position measuring 0.5 cm, axilla negative, biopsy revealed grade 1 IDC with focal extracellular mucin plus grade 1 DCIS ER 100%, PR 99%, Ki-67 5%, HER2 1+, FISH negative   09/30/2022 Cancer Staging   Staging form: Breast, AJCC 8th Edition - Clinical: Stage IA (cT1b, cN0, cM0, G1, ER+, PR+, HER2-) - Signed by Nicholas Lose, MD on 09/30/2022 Histologic grading system: 3 grade system   11/04/2022 Surgery   Left lumpectomy: Grade 1 IDC 0.8 cm, margins negative, ER 100%, PR 99%, HER2 negative 1+, Ki-67 5%   11/2022 -  Anti-estrogen oral therapy   1 mg Anastrozole x 5 years     INTERVAL HISTORY:  [name] to review her survivorship care plan detailing her treatment course for breast cancer, as well as monitoring long-term side effects of that treatment, education regarding health maintenance, screening, and overall wellness and health promotion.     Overall, [name] reports feeling quite well.  She is taking Anastrozole daily without difficulty.  She was due for total shoulder replacement but a right bundle branch block was discovered requiring cardiology evaluation.  She underwent ischemic eval with cardiac CTA which cleared her for surgery.  Her surgery is scheduled 03/11/2023.    REVIEW OF SYSTEMS:  Review of Systems  Constitutional:  Negative for appetite change, chills, fatigue, fever and unexpected weight change.  HENT:   Negative for hearing loss, lump/mass and trouble swallowing.   Eyes:  Negative for eye problems and icterus.  Respiratory:  Negative for chest tightness, cough and shortness of breath.   Cardiovascular:  Negative for chest pain, leg swelling and palpitations.  Gastrointestinal:  Negative for abdominal  distention, abdominal pain, constipation, diarrhea, nausea and vomiting.  Endocrine: Negative for hot flashes.  Genitourinary:  Negative for difficulty urinating.   Musculoskeletal:  Negative for arthralgias.  Skin:  Negative for itching and rash.  Neurological:  Negative for dizziness, extremity weakness, headaches and numbness.  Hematological:  Negative for adenopathy. Does not bruise/bleed easily.  Psychiatric/Behavioral:  Negative for depression. The patient is not nervous/anxious.   Breast: Denies any new nodularity, masses, tenderness, nipple changes, or nipple discharge.    PAST MEDICAL/SURGICAL HISTORY:  Past Medical History:  Diagnosis Date   Cancer [facility])    Left breast cancer   Diastolic congestive heart failure ([place])    02/05/15  states no fluid in extremities, breathing same as has been   GERD (gastroesophageal reflux disease)    Hepatic steatosis    History of blood transfusion    Hx of cholecystectomy    Interstitial lung disease (H[facility])    Osteoarthritis    Pulmonary fibrosis (HCC)    Raynaud disease    Rheumatoid arthritis(714.0)    Scleroderma (HCC)    Seasonal allergies    Shortness of breath dyspnea    states normal for her   Systemic sclerosis [facility])    Past Surgical History:  Procedure Laterality Date   ABDOMINAL HYSTERECTOMY     BLADDER SUSPENSION     BREAST BIOPSY  11/03/2022   MM LT RADIOACTIVE SEED LOC MAMMO GUIDE 11/03/2022 [facility] MAMMOGRAPHY   BREAST LUMPECTOMY WITH RADIOACTIVE SEED LOCALIZATION Left 11/04/2022   Procedure: LEFT BREAST LUMPECTOMY WITH RADIOACTIVE SEED LOCALIZATION;  Surgeon: Erroll Luna, MD;  Location: [facility]  OR;  Service: General;  Laterality: Left;   CARPAL TUNNEL RELEASE Right 2007   EYE SURGERY Bilateral 2023   cataract w/ IOL   LAPAROSCOPIC CHOLECYSTECTOMY     LEFT HEART CATHETERIZATION WITH CORONARY/GRAFT ANGIOGRAM  03/15/2014   Procedure: LEFT HEART CATHETERIZATION WITH Beatrix Fetters;  Surgeon: Burnell Blanks, MD;  Location: [facility] CATH LAB;  Service: Cardiovascular;;   TONSILLECTOMY     TOTAL KNEE ARTHROPLASTY Left 02/11/2015   Procedure: LEFT TOTAL KNEE ARTHROPLASTY, RIGHT KNEE CORTISONE INJECTION;  Surgeon: Gaynelle Arabian, MD;  Location: [facility] ORS;  Service: Orthopedics;  Laterality: Left;   TOTAL KNEE ARTHROPLASTY Right 10/05/2016   Procedure: RIGHT TOTAL KNEE ARTHROPLASTY;  Surgeon: Gaynelle Arabian, MD;  Location: [facility] ORS;  Service: Orthopedics;  Laterality: Right;   VESICOVAGINAL FISTULA CLOSURE W/ TAH  11/30/1985     ALLERGIES:  Allergies  Allergen Reactions   Codeine     Neurological problems - hallucinations, "spacey"     CURRENT MEDICATIONS:  Outpatient Encounter Medications as of 02/23/2023  Medication Sig Note   acetaminophen (TYLENOL) 500 MG tablet Take 1,000 mg by mouth every 6 (six) hours as needed (pain.).    albuterol (VENTOLIN HFA) 108 (90 Base) MCG/ACT inhaler INHALE 2 PUFFS INTO THE LUNGS EVERY 6 (SIX) HOURS AS NEEDED FOR WHEEZING.    amLODipine (NORVASC) 5 MG tablet Take 5 mg by mouth in the morning.    anastrozole (ARIMIDEX) 1 MG tablet Take 1 tablet (1 mg total) by mouth daily.    aspirin EC 81 MG tablet Take 81 mg by mouth at bedtime.    furosemide (LASIX) 40 MG tablet TAKE 2 TABLETS EVERY DAY (NEED MD APPOINTMENT FOR REFILLS)    Homeopathic Products (ZINC) LOZG Take 1 tablet by mouth daily as needed (immune support).    hydroxychloroquine (PLAQUENIL) 200 MG tablet Take 400 mg by mouth in the morning.    metoprolol tartrate (LOPRESSOR) 100 MG tablet Take one tablet two hours prior to cardiac CTA.    nortriptyline (PAMELOR) 10 MG capsule Take 30 mg by mouth at bedtime.    pantoprazole (PROTONIX) 40 MG tablet Take 40 mg by mouth 2 (two) times daily.    Povidone, PF, (IVIZIA DRY EYES) 0.5 % SOLN Place 1-2 drops into both eyes 3 (three) times daily as needed (dry/irritated eyes.).    predniSONE (DELTASONE) 5 MG tablet Take 5 mg by mouth in the morning.    rosuvastatin  (CRESTOR) 10 MG tablet Take 1 tablet (10 mg total) by mouth daily.    zolpidem (AMBIEN) 10 MG tablet Take 10 mg by mouth at bedtime as needed for sleep.    benzonatate (TESSALON) 100 MG capsule Take 200 mg by mouth daily as needed for cough. (Patient not taking: Reported on 02/23/2023) 01/04/2023: seldom   ipratropium (ATROVENT) 0.06 % nasal spray Place 1 spray into both nostrils in the morning and at bedtime. (Patient not taking: Reported on 02/23/2023)    Lidocaine-Adhesive Sheets [facility] PATCH EX) Place 1 patch onto the skin daily as needed (pain). OTC (Patient not taking: Reported on 02/23/2023)    [DISCONTINUED] oxyCODONE (OXY IR/ROXICODONE) 5 MG immediate release tablet Take 1 tablet (5 mg total) by mouth every 6 (six) hours as needed for severe pain.    No facility-administered encounter medications on file as of 02/23/2023.     ONCOLOGIC FAMILY HISTORY:  Family History  Problem Relation Age of Onset   Stroke Mother    Heart disease Mother  CABG at age 49   Heart attack Father        Died of MI at age 19   Emphysema Father    Dementia Brother    Osteoarthritis Other    Celiac disease Other      SOCIAL HISTORY:  Social History   Socioeconomic History   Marital status: Married    Spouse name: Broadus John   Number of children: 3   Years of education: Not on file   Highest education level: Some college, no degree  Occupational History   Occupation: Optician, dispensing: [employer]  Tobacco Use   Smoking status: Former    Packs/day: 1.00    Years: 20.00    Additional pack years: 0.00    Total pack years: 20.00    Types: Cigarettes    Quit date: 11/30/1988    Years since quitting: 34.2   Smokeless tobacco: Never  Vaping Use   Vaping Use: Never used  Substance and Sexual Activity   Alcohol use: No   Drug use: No   Sexual activity: Not Currently  Other Topics Concern   Not on file  Social History Narrative   Not on file   Social Determinants of  Health   Financial Resource Strain: Not on file  Food Insecurity: Not on file  Transportation Needs: Not on file  Physical Activity: Not on file  Stress: Not on file  Social Connections: Not on file  Intimate Partner Violence: Not on file     OBSERVATIONS/OBJECTIVE:  BP (!) 129/58 (BP Location: Right Arm, Patient Position: Sitting)   Pulse 93   Temp 98.4 F (36.9 C) (Tympanic)   Resp 18   Ht 5\' 3"  (1.6 m)   Wt 186 lb (84.4 kg)   SpO2 99%   BMI 32.95 kg/m  GENERAL: Patient is a well appearing female in no acute distress HEENT:  Sclerae anicteric.  Oropharynx clear and moist. No ulcerations or evidence of oropharyngeal candidiasis. Neck is supple.  NODES:  No cervical, supraclavicular, or axillary lymphadenopathy palpated.  BREAST EXAM: Breast status postlumpectomy no sign of local recurrence right breast is benign. LUNGS:  Clear to auscultation bilaterally.  No wheezes or rhonchi. HEART:  Regular rate and rhythm. No murmur appreciated. ABDOMEN:  Soft, nontender.  Positive, normoactive bowel sounds. No organomegaly palpated. MSK:  No focal spinal tenderness to palpation. Full range of motion bilaterally in the upper extremities. EXTREMITIES:  No peripheral edema.   SKIN:  Clear with no obvious rashes or skin changes. No nail dyscrasia. NEURO:  Nonfocal. Well oriented.  Appropriate affect.  LABORATORY DATA:  None for this visit.  DIAGNOSTIC IMAGING:  None for this visit.      ASSESSMENT AND PLAN:  Ms.. [name] is a pleasant 77 y.o. female with Stage IA left breast invasive ductal carcinoma, ER+/PR+/HER2-, diagnosed in 09/2022, treated with lumpectomy and anti-estrogen therapy with Anastrozole beginning in 10/2022.  She presents to the Survivorship Clinic for our initial meeting and routine follow-up post-completion of treatment for breast cancer.    1. Stage IA left breast cancer:  Ms. Oleski is continuing to recover from definitive treatment for breast cancer. She  will follow-up with her medical oncologist, Dr. Lindi Adie in 6 months with history and physical exam per surveillance protocol.  She will continue her anti-estrogen therapy with anastrozole. Thus far, she is tolerating the anastrozole well, with minimal side effects. She was instructed to make Dr. Lindi Adie or myself aware if she begins  to experience any worsening side effects of the medication and I could see her back in clinic to help manage those side effects, as needed. Her mammogram is due Tober 2024; orders placed today.  Today, a comprehensive survivorship care plan and treatment summary was reviewed with the patient today detailing her breast cancer diagnosis, treatment course, potential late/long-term effects of treatment, appropriate follow-up care with recommendations for the future, and patient education resources.  A copy of this summary, along with a letter will be sent to the patient's primary care provider via mail/fax/In Basket message after today's visit.    2. Bone health:  Given Ms. Kilts's age/history of breast cancer and her current treatment regimen including anti-estrogen therapy with Anastrozole, she is at risk for bone demineralization.  She undergoes bone density testing with Dr. Ophelia Charter office and will continue to do so.  Asked my nurse to reach out to his office to obtain her most recent results.  The patient understands we recommend this to be completed every 2 years..  She was given education on specific activities to promote bone health.  3. Cancer screening:  Due to Ms. Hanners's history and her age, she should receive screening for skin cancers.  The information and recommendations are listed on the patient's comprehensive care plan/treatment summary and were reviewed in detail with the patient.    4. Health maintenance and wellness promotion: Ms. Cappa was encouraged to consume 5-7 servings of fruits and vegetables per day. We reviewed the "Nutrition Rainbow" handout.   She was also encouraged to engage in moderate to vigorous exercise for 30 minutes per day most days of the week.  She was instructed to limit her alcohol consumption and continue to abstain from tobacco use.     5. Support services/counseling: It is not uncommon for this period of the patient's cancer care trajectory to be one of many emotions and stressors.   She was given information regarding our available services and encouraged to contact me with any questions or for help enrolling in any of our support group/programs.    Follow up instructions:    -Return to cancer center in 6 months for f/u with Dr. Lindi Adie  -Mammogram due in 08/2023 -Bone density testing with Dr. Radene Knee -She is welcome to return back to the Survivorship Clinic at any time; no additional follow-up needed at this time.  -Consider referral back to survivorship as a long-term survivor for continued surveillance  The patient was provided an opportunity to ask questions and all were answered. The patient agreed with the plan and demonstrated an understanding of the instructions.   Total encounter time:40 minutes*in face-to-face visit time, chart review, lab review, care coordination, order entry, and documentation of the encounter time.    Wilber Bihari, NP 02/23/23 3:23 PM Medical Oncology and Hematology [facility] [place], [place] [address] Tel. [phone]    Fax. [phone]  *Total Encounter Time as defined by the Centers for Medicare and Medicaid Services includes, in addition to the face-to-face time of a patient visit (documented in the note above) non-face-to-face time: obtaining and reviewing outside history, ordering and reviewing medications, tests or procedures, care coordination (communications with other health care professionals or caregivers) and documentation in the medical record.

## 2023-03-01 NOTE — Progress Notes (Signed)
Patient is a retired Chartered loss adjuster from [place] Vaccine received:  []  No [x]  Yes Date of any COVID positive Test in last 90 days:  None  ??   PCP - Asencion Noble, MD Cardiologist - Kirk Ruths, MD Pulmonology- Brand Males, MD Oncology- Nicholas Lose, MD   Chest x-ray - CT chest  08-31-2022  epic EKG - 01-21-2023 epic Stress Test - Carlton Adam 02-12-2021  epic ECHO - 07-14-2022  epic Cardiac Cath - 2015  [facility] by Darlina Guys, MD   PCR screen: [x]  Ordered & Completed                      []   No Order but Needs PROFEND                      []   N/A for this surgery   Surgery Plan:  [x]  Ambulatory                            []  Outpatient in bed                            []  Admit   Anesthesia:    [x]  General  []  Spinal                           []   Choice []   MAC   Pacemaker / ICD device [x]  No []  Yes        Device order form faxed [x]  No    []   Yes      Faxed to:   Spinal Cord Stimulator:[x]  No []  Yes      (Remind patient to bring remote DOS) Other Implants:    History of Sleep Apnea? [x]  No []  Yes   CPAP used?- [x]  No []  Yes     Does the patient monitor blood sugar? []  No []  Yes  [x]  N/A   no DM   Blood Thinner / Instructions:  none Aspirin Instructions:  ASA 81mg    Patient said she was told to Hold x 7 days   ERAS Protocol Ordered: []  No  [x]  Yes PRE-SURGERY [x]  ENSURE  []  G2  Patient is to be NPO after: 06:30  am   Comments: The patient was given Benzoyl peroxide Gel as ordered. Instruction regarding application starting 2 days prior to surgery was given and patient voiced understanding.    Activity level: Patient can climb a flight of stairs without difficulty; [x]  No CP   but would have _SOB   Patient can perform ADLs without assistance.    Anesthesia review: ILD, CHF, HTN, cirrhosis- NASH, RA, Raynaud's, scleraderma, s/p breast cancer? current chemotherapy   Patient denies shortness of breath, fever, cough and chest pain at PAT  appointment.   Patient verbalized understanding and agreement to the Pre-Surgical Instructions that were given to them at this PAT appointment. Patient was also educated of the need to review these PAT instructions again prior to her surgery.I reviewed the appropriate phone numbers to call if they have any and questions or concerns.

## 2023-03-01 NOTE — Patient Instructions (Addendum)
SURGICAL WAITING ROOM VISITATION Patients having surgery or a procedure may have no more than 2 support people in the waiting area - these visitors may rotate in the visitor waiting room.   Due to an increase in RSV and influenza rates and associated hospitalizations, children ages 33 and under may not visit patients in [place]. If the patient needs to stay at the hospital during part of their recovery, the visitor guidelines for inpatient rooms apply.  PRE-OP VISITATION  Pre-op nurse will coordinate an appropriate time for 1 support person to accompany the patient in pre-op.  This support person may not rotate.  This visitor will be contacted when the time is appropriate for the visitor to come back in the pre-op area.  Please refer to the [facility] website for the visitor guidelines for Inpatients (after your surgery is over and you are in a regular room).  You are not required to quarantine at this time prior to your surgery. However, you must do this: Hand Hygiene often Do NOT share personal items Notify your provider if you are in close contact with someone who has COVID or you develop fever 100.4 or greater, new onset of sneezing, cough, sore throat, shortness of breath or body aches.  If you test positive for Covid or have been in contact with anyone that has tested positive in the last 10 days please notify you surgeon.    Your procedure is scheduled on:  Thursday  March 11, 2023  Report to [facility] Main Entrance: [place] entrance where the [employer] is available.   Report to admitting at: 10:00 AM  +++++Call this number if you have any questions or problems the morning of surgery [phone]  Do not eat food after Midnight the night prior to your surgery/procedure.  After Midnight you may have the following liquids until 09:30 AM DAY OF SURGERY  Clear Liquid Diet Water Black Coffee (sugar ok, NO MILK/CREAM OR CREAMERS)  Tea (sugar ok, NO  MILK/CREAM OR CREAMERS) regular and decaf                             Plain Jell-O  with no fruit (NO RED)                                           Fruit ices (not with fruit pulp, NO RED)                                     Popsicles (NO RED)                                                                  Juice: apple, WHITE grape, WHITE cranberry Sports drinks like Gatorade or Powerade (NO RED)                   The day of surgery:  Drink ONE (1) Pre-Surgery Clear Ensure at   09:30 AM the morning of surgery. Drink in one sitting. Do not sip.  This drink was given to you during your hospital pre-op appointment visit. Nothing else to drink after completing the Pre-Surgery Clear Ensure: No candy, chewing gum or throat lozenges.    FOLLOW  ANY ADDITIONAL PRE OP INSTRUCTIONS YOU RECEIVED FROM YOUR SURGEON'S OFFICE!!!   Oral Hygiene is also important to reduce your risk of infection.        Remember - BRUSH YOUR TEETH THE MORNING OF SURGERY WITH YOUR REGULAR TOOTHPASTE  Do NOT smoke after Midnight the night before surgery.  Take ONLY these medicines the morning of surgery with A SIP OF WATER: Pantoprazole, amlodipine and anastrozole (Arimidex). You may use your Eye drops, inhaler, nasal spray and Take Tylenol if needed.                    You may not have any metal on your body including hair pins, jewelry, and body piercing  Do not wear make-up, lotions, powders, perfumes, or deodorant  Do not wear nail polish including gel and S&S, artificial / acrylic nails, or any other type of covering on natural nails including finger and toenails. If you have artificial nails, gel coating, etc., that needs to be removed by a nail salon, Please have this removed prior to surgery. Not doing so may mean that your surgery could be cancelled or delayed if the Surgeon or anesthesia staff feels like they are unable to monitor you safely.   Do not shave 48 hours prior to surgery to avoid nicks in your  skin which may contribute to postoperative infections.   Contacts, Hearing Aids, dentures or bridgework may not be worn into surgery. DENTURES WILL BE REMOVED PRIOR TO SURGERY PLEASE DO NOT APPLY "Poly grip" OR ADHESIVES!!!  Patients discharged on the day of surgery will not be allowed to drive home.  Someone NEEDS to stay with you for the first 24 hours after anesthesia.  Do not bring your home medications to the hospital. The Pharmacy will dispense medications listed on your medication list to you during your admission in the Hospital.  Special Instructions: Bring a copy of your healthcare power of attorney and living will documents the day of surgery, if you wish to have them scanned into your [place] Medical Records- EPIC  Please read over the following fact sheets you were given: IF YOU HAVE QUESTIONS ABOUT YOUR PRE-OP INSTRUCTIONS, PLEASE CALL [phone] (KAY)   +++++++ PLEASE FOLLOW THE ATTACHED INFORMATION REGARDING SHOWERING / [place]. Start this schedule on :   Sunday  March 07, 2023     Preparing for Total Shoulder Arthroplasty ================================================================= Please follow these instructions carefully, in addition to any other special Bathing information that was explained to you at the Presurgical Appointment:  BENZOYL PEROXIDE 5% GEL: Used to kill bacteria on the skin which could cause an infection at the surgery site.   Please do not use if you have an allergy to benzoyl peroxide. If your skin becomes reddened/irritated stop using the benzoyl peroxide and inform your Doctor.   Starting two days before surgery, apply as follows:  1. Apply benzoyl peroxide gel in the morning and at night. Apply after taking a shower. If you are not taking a shower, clean entire shoulder front, back, and side, along with the armpit with a clean wet washcloth.  2. Place a quarter-sized dollop of the gel on your SHOULDER and  rub in thoroughly, making sure to cover the front, back, and side of your shoulder,  along with the armpit.   2 Days prior to Surgery    Tuesday  April 9, 123456 First Application _______ Morning Second Application _______ Night  Day Before Surgery          Wednesday  April 10, 123456 First Application______ Morning  On the night before surgery, wash your entire body (except hair, face and private areas) with CHG Soap. THEN, rub in the LAST application of the Benzoyl Peroxide Gel on your shoulder.   3. On the Morning of Surgery wash your BODY AGAIN with CHG Soap (except hair, face and private areas)  4. DO NOT USE THE BENZOYL PEROXIDE GEL ON THE DAY OF YOUR SURGERY       FAILURE TO FOLLOW THESE INSTRUCTIONS MAY RESULT IN THE CANCELLATION OF YOUR SURGERY  PATIENT SIGNATURE_________________________________  NURSE SIGNATURE__________________________________  ________________________________________________________________________       [name]    An incentive spirometer is a tool that can help keep your lungs clear and active. This tool measures how well you are filling your lungs with each breath. Taking long deep breaths may help reverse or decrease the chance of developing breathing (pulmonary) problems (especially infection) following: A long period of time when you are unable to move or be active. BEFORE THE PROCEDURE  If the spirometer includes an indicator to show your best effort, your nurse or respiratory therapist will set it to a desired goal. If possible, sit up straight or lean slightly forward. Try not to slouch. Hold the incentive spirometer in an upright position. INSTRUCTIONS FOR USE  Sit on the edge of your bed if possible, or sit up as far as you can in bed or on a chair. Hold the incentive spirometer in an upright position. Breathe out normally. Place the mouthpiece in your mouth and seal your lips tightly around it. Breathe in slowly and as  deeply as possible, raising the piston or the ball toward the top of the column. Hold your breath for 3-5 seconds or for as long as possible. Allow the piston or ball to fall to the bottom of the column. Remove the mouthpiece from your mouth and breathe out normally. Rest for a few seconds and repeat Steps 1 through 7 at least 10 times every 1-2 hours when you are awake. Take your time and take a few normal breaths between deep breaths. The spirometer may include an indicator to show your best effort. Use the indicator as a goal to work toward during each repetition. After each set of 10 deep breaths, practice coughing to be sure your lungs are clear. If you have an incision (the cut made at the time of surgery), support your incision when coughing by placing a pillow or rolled up towels firmly against it. Once you are able to get out of bed, walk around indoors and cough well. You may stop using the incentive spirometer when instructed by your caregiver.  RISKS AND COMPLICATIONS Take your time so you do not get dizzy or light-headed. If you are in pain, you may need to take or ask for pain medication before doing incentive spirometry. It is harder to take a deep breath if you are having pain. AFTER USE Rest and breathe slowly and easily. It can be helpful to keep track of a log of your progress. Your caregiver can provide you with a simple table to help with this. If you are using the spirometer at home, follow these instructions: [place] IF:  You are having  difficultly using the spirometer. You have trouble using the spirometer as often as instructed. Your pain medication is not giving enough relief while using the spirometer. You develop fever of 100.5 F (38.1 C) or higher.                                                                                                    SEEK IMMEDIATE MEDICAL CARE IF:  You cough up bloody sputum that had not been present before. You develop fever  of 102 F (38.9 C) or greater. You develop worsening pain at or near the incision site. MAKE SURE YOU:  Understand these instructions. Will watch your condition. Will get help right away if you are not doing well or get worse. Document Released: 03/29/2007 Document Revised: 02/08/2012 Document Reviewed: 05/30/2007 [facility] Patient Information 2014 [place], [place].

## 2023-03-02 ENCOUNTER — Encounter (HOSPITAL_COMMUNITY)
Admission: RE | Admit: 2023-03-02 | Discharge: 2023-03-02 | Disposition: A | Discharge status: Home / Self Care | Payer: Medicare HMO | Source: Ambulatory Visit | Attending: Orthopedic Surgery | Admitting: Orthopedic Surgery

## 2023-03-02 DIAGNOSIS — Z01818 Encounter for other preprocedural examination: Secondary | ICD-10-CM

## 2023-03-02 DIAGNOSIS — I2510 Atherosclerotic heart disease of native coronary artery without angina pectoris: Secondary | ICD-10-CM

## 2023-03-02 DIAGNOSIS — M349 Systemic sclerosis, unspecified: Secondary | ICD-10-CM

## 2023-03-02 DIAGNOSIS — K769 Liver disease, unspecified: Secondary | ICD-10-CM

## 2023-03-11 ENCOUNTER — Ambulatory Visit: Admit: 2023-03-11 | Payer: Medicare HMO | Admitting: Orthopedic Surgery

## 2023-03-11 SURGERY — ARTHROPLASTY, SHOULDER, TOTAL, REVERSE
Anesthesia: General | Site: Shoulder | Laterality: Right

## 2023-03-15 DIAGNOSIS — M5126 Other intervertebral disc displacement, lumbar region: Secondary | ICD-10-CM | POA: Diagnosis not present

## 2023-03-15 DIAGNOSIS — J849 Interstitial pulmonary disease, unspecified: Secondary | ICD-10-CM | POA: Diagnosis not present

## 2023-03-15 DIAGNOSIS — M0579 Rheumatoid arthritis with rheumatoid factor of multiple sites without organ or systems involvement: Secondary | ICD-10-CM | POA: Diagnosis not present

## 2023-03-15 DIAGNOSIS — L03011 Cellulitis of right finger: Secondary | ICD-10-CM | POA: Diagnosis not present

## 2023-03-15 DIAGNOSIS — M340 Progressive systemic sclerosis: Secondary | ICD-10-CM | POA: Diagnosis not present

## 2023-03-15 DIAGNOSIS — M1991 Primary osteoarthritis, unspecified site: Secondary | ICD-10-CM | POA: Diagnosis not present

## 2023-03-15 DIAGNOSIS — Z6832 Body mass index (BMI) 32.0-32.9, adult: Secondary | ICD-10-CM | POA: Diagnosis not present

## 2023-03-15 DIAGNOSIS — I7300 Raynaud's syndrome without gangrene: Secondary | ICD-10-CM | POA: Diagnosis not present

## 2023-03-15 DIAGNOSIS — R140 Abdominal distension (gaseous): Secondary | ICD-10-CM | POA: Diagnosis not present

## 2023-03-23 ENCOUNTER — Other Ambulatory Visit: Payer: Self-pay | Admitting: Cardiology

## 2023-03-23 DIAGNOSIS — R002 Palpitations: Secondary | ICD-10-CM

## 2023-04-22 NOTE — Progress Notes (Signed)
Surgery orders requested via Epic inbox. °

## 2023-04-29 NOTE — Patient Instructions (Signed)
DUE TO COVID-19 ONLY TWO VISITORS  (aged 75 and older)  ARE ALLOWED TO COME WITH YOU AND STAY IN THE WAITING ROOM ONLY DURING PRE OP AND PROCEDURE.   **NO VISITORS ARE ALLOWED IN THE SHORT STAY AREA OR RECOVERY ROOM!!**  IF YOU WILL BE ADMITTED INTO THE HOSPITAL YOU ARE ALLOWED ONLY FOUR SUPPORT PEOPLE DURING VISITATION HOURS ONLY (7 AM -8PM)   The support person(s) must pass our screening, gel in and out, and wear a mask at all times, including in the patient's room. Patients must also wear a mask when staff or their support person are in the room. Visitors GUEST BADGE MUST BE WORN VISIBLY  One adult visitor may remain with you overnight and MUST be in the room by 8 P.M.     Your procedure is scheduled on: 05/06/23   Report to [facility] Main Entrance    Report to admitting at : 8:00 AM   Call this number if you have problems the morning of surgery [phone]   Do not eat food :After Midnight.   After Midnight you may have the following liquids until: 7:30 AM DAY OF SURGERY  Water Black Coffee (sugar ok, NO MILK/CREAM OR CREAMERS)  Tea (sugar ok, NO MILK/CREAM OR CREAMERS) regular and decaf                             Plain Jell-O (NO RED)                                           Fruit ices (not with fruit pulp, NO RED)                                     Popsicles (NO RED)                                                                  Juice: apple, WHITE grape, WHITE cranberry Sports drinks like Gatorade (NO RED)   The day of surgery:  Drink ONE (1) Pre-Surgery Clear Ensure at : 7:30 AM the morning of surgery. Drink in one sitting. Do not sip.  This drink was given to you during your hospital  pre-op appointment visit. Nothing else to drink after completing the  Pre-Surgery Clear Ensure or G2.          If you have questions, please contact your surgeon's office.  Oral Hygiene is also important to reduce your risk of infection.                                     Remember - BRUSH YOUR TEETH THE MORNING OF SURGERY WITH YOUR REGULAR TOOTHPASTE  DENTURES WILL BE REMOVED PRIOR TO SURGERY PLEASE DO NOT APPLY "Poly grip" OR ADHESIVES!!!   Do NOT smoke after Midnight   Take these medicines the morning of surgery with A SIP OF WATER:ANASTROZOLE,AMLODIPINE,PANTOPRAZOLE,PREDNISONE.USE INHALERS AS USUAL.TYLENOL AS NEEDED.  You may not have any metal on your body including hair pins, jewelry, and body piercing             Do not wear make-up, lotions, powders, perfumes/cologne, or deodorant  Do not wear nail polish including gel and S&S, artificial/acrylic nails, or any other type of covering on natural nails including finger and toenails. If you have artificial nails, gel coating, etc. that needs to be removed by a nail salon please have this removed prior to surgery or surgery may need to be canceled/ delayed if the surgeon/ anesthesia feels like they are unable to be safely monitored.   Do not shave  48 hours prior to surgery.    Do not bring valuables to the hospital. [place] IS NOT             RESPONSIBLE   FOR VALUABLES.   Contacts, glasses, or bridgework may not be worn into surgery.   Bring small overnight bag day of surgery.   DO NOT BRING YOUR HOME MEDICATIONS TO THE HOSPITAL. PHARMACY WILL DISPENSE MEDICATIONS LISTED ON YOUR MEDICATION LIST TO YOU DURING YOUR ADMISSION IN THE HOSPITAL!    Patients discharged on the day of surgery will not be allowed to drive home.  Someone NEEDS to stay with you for the first 24 hours after anesthesia.   Special Instructions: Bring a copy of your healthcare power of attorney and living will documents         the day of surgery if you haven't scanned them before.              Please read over the following fact sheets you were given: IF YOU HAVE QUESTIONS ABOUT YOUR PRE-OP INSTRUCTIONS PLEASE CALL [phone]      Pre-operative 5 CHG Bath Instructions   You can play a key  role in reducing the risk of infection after surgery. Your skin needs to be as free of germs as possible. You can reduce the number of germs on your skin by washing with CHG (chlorhexidine gluconate) soap before surgery. CHG is an antiseptic soap that kills germs and continues to kill germs even after washing.   DO NOT use if you have an allergy to chlorhexidine/CHG or antibacterial soaps. If your skin becomes reddened or irritated, stop using the CHG and notify one of our RNs at [phone].   Please shower with the CHG soap starting 4 days before surgery using the following schedule:     Please keep in mind the following:  DO NOT shave, including legs and underarms, starting the day of your first shower.   You may shave your face at any point before/day of surgery.  Place clean sheets on your bed the day you start using CHG soap. Use a clean washcloth (not used since being washed) for each shower. DO NOT sleep with pets once you start using the CHG.   CHG Shower Instructions:  If you choose to wash your hair and private area, wash first with your normal shampoo/soap.  After you use shampoo/soap, rinse your hair and body thoroughly to remove shampoo/soap residue.  Turn the water OFF and apply about 3 tablespoons (45 ml) of CHG soap to a CLEAN washcloth.  Apply CHG soap ONLY FROM YOUR NECK DOWN TO YOUR TOES (washing for 3-5 minutes)  DO NOT use CHG soap on face, private areas, open wounds, or sores.  Pay special attention to the area where your surgery is being performed.  If  you are having back surgery, having someone wash your back for you may be helpful. Wait 2 minutes after CHG soap is applied, then you may rinse off the CHG soap.  Pat dry with a clean towel  Put on clean clothes/pajamas   If you choose to wear lotion, please use ONLY the CHG-compatible lotions on the back of this paper.     Additional instructions for the day of surgery: DO NOT APPLY any lotions, deodorants,  cologne, or perfumes.   Put on clean/comfortable clothes.  Brush your teeth.  Ask your nurse before applying any prescription medications to the skin.      CHG Compatible Lotions   Aveeno Moisturizing lotion  Cetaphil Moisturizing Cream  Cetaphil Moisturizing Lotion  Clairol Herbal Essence Moisturizing Lotion, Dry Skin  Clairol Herbal Essence Moisturizing Lotion, Extra Dry Skin  Clairol Herbal Essence Moisturizing Lotion, Normal Skin  Curel Age Defying Therapeutic Moisturizing Lotion with Alpha Hydroxy  Curel Extreme Care Body Lotion  Curel Soothing Hands Moisturizing Hand Lotion  Curel Therapeutic Moisturizing Cream, Fragrance-Free  Curel Therapeutic Moisturizing Lotion, Fragrance-Free  Curel Therapeutic Moisturizing Lotion, Original Formula  Eucerin Daily Replenishing Lotion  Eucerin Dry Skin Therapy Plus Alpha Hydroxy Crme  Eucerin Dry Skin Therapy Plus Alpha Hydroxy Lotion  Eucerin Original Crme  Eucerin Original Lotion  Eucerin Plus Crme Eucerin Plus Lotion  Eucerin TriLipid Replenishing Lotion  Keri Anti-Bacterial Hand Lotion  Keri Deep Conditioning Original Lotion Dry Skin Formula Softly Scented  Keri Deep Conditioning Original Lotion, Fragrance Free Sensitive Skin Formula  Keri Lotion Fast Absorbing Fragrance Free Sensitive Skin Formula  Keri Lotion Fast Absorbing Softly Scented Dry Skin Formula  Keri Original Lotion  Keri Skin Renewal Lotion Keri Silky Smooth Lotion  Keri Silky Smooth Sensitive Skin Lotion  Nivea Body Creamy Conditioning Oil  Nivea Body Extra Enriched Lotion  Nivea Body Original Lotion  Nivea Body Sheer Moisturizing Lotion Nivea Crme  Nivea Skin Firming Lotion  NutraDerm 30 Skin Lotion  NutraDerm Skin Lotion  NutraDerm Therapeutic Skin Cream  NutraDerm Therapeutic Skin Lotion  ProShield Protective Hand Cream  Provon moisturizing lotion [place]- Preparing for Total Shoulder Arthroplasty    Before surgery, you can play an important  role. Because skin is not sterile, your skin needs to be as free of germs as possible. You can reduce the number of germs on your skin by using the following products. Benzoyl Peroxide Gel Reduces the number of germs present on the skin Applied twice a day to shoulder area starting two days before surgery    ==================================================================  Please follow these instructions carefully:  BENZOYL PEROXIDE 5% GEL  Please do not use if you have an allergy to benzoyl peroxide.   If your skin becomes reddened/irritated stop using the benzoyl peroxide.  Starting two days before surgery, apply as follows: Apply benzoyl peroxide in the morning and at night. Apply after taking a shower. If you are not taking a shower clean entire shoulder front, back, and side along with the armpit with a clean wet washcloth.  Place a quarter-sized dollop on your shoulder and rub in thoroughly, making sure to cover the front, back, and side of your shoulder, along with the armpit.   2 days before ____ AM   ____ PM              1 day before ____ AM   ____ PM  Do this twice a day for two days.  (Last application is the night before surgery, AFTER using the CHG soap as described below).  Do NOT apply benzoyl peroxide gel on the day of surgery.  Incentive Spirometer  An incentive spirometer is a tool that can help keep your lungs clear and active. This tool measures how well you are filling your lungs with each breath. Taking long deep breaths may help reverse or decrease the chance of developing breathing (pulmonary) problems (especially infection) following: A long period of time when you are unable to move or be active. BEFORE THE PROCEDURE  If the spirometer includes an indicator to show your best effort, your nurse or respiratory therapist will set it to a desired goal. If possible, sit up straight or lean slightly forward. Try not to slouch. Hold the  incentive spirometer in an upright position. INSTRUCTIONS FOR USE  Sit on the edge of your bed if possible, or sit up as far as you can in bed or on a chair. Hold the incentive spirometer in an upright position. Breathe out normally. Place the mouthpiece in your mouth and seal your lips tightly around it. Breathe in slowly and as deeply as possible, raising the piston or the ball toward the top of the column. Hold your breath for 3-5 seconds or for as long as possible. Allow the piston or ball to fall to the bottom of the column. Remove the mouthpiece from your mouth and breathe out normally. Rest for a few seconds and repeat Steps 1 through 7 at least 10 times every 1-2 hours when you are awake. Take your time and take a few normal breaths between deep breaths. The spirometer may include an indicator to show your best effort. Use the indicator as a goal to work toward during each repetition. After each set of 10 deep breaths, practice coughing to be sure your lungs are clear. If you have an incision (the cut made at the time of surgery), support your incision when coughing by placing a pillow or rolled up towels firmly against it. Once you are able to get out of bed, walk around indoors and cough well. You may stop using the incentive spirometer when instructed by your caregiver.  RISKS AND COMPLICATIONS Take your time so you do not get dizzy or light-headed. If you are in pain, you may need to take or ask for pain medication before doing incentive spirometry. It is harder to take a deep breath if you are having pain. AFTER USE Rest and breathe slowly and easily. It can be helpful to keep track of a log of your progress. Your caregiver can provide you with a simple table to help with this. If you are using the spirometer at home, follow these instructions: SEEK MEDICAL CARE IF:  You are having difficultly using the spirometer. You have trouble using the spirometer as often as instructed. Your  pain medication is not giving enough relief while using the spirometer. You develop fever of 100.5 F (38.1 C) or higher. SEEK IMMEDIATE MEDICAL CARE IF:  You cough up bloody sputum that had not been present before. You develop fever of 102 F (38.9 C) or greater. You develop worsening pain at or near the incision site. MAKE SURE YOU:  Understand these instructions. Will watch your condition. Will get help right away if you are not doing well or get worse. Document Released: 03/29/2007 Document Revised: 02/08/2012 Document Reviewed: 05/30/2007 [facility] Patient Information 2014 [place], [place].  ________________________________________________________________________ 

## 2023-04-30 ENCOUNTER — Encounter (HOSPITAL_COMMUNITY)
Admission: RE | Admit: 2023-04-30 | Discharge: 2023-04-30 | Disposition: A | Discharge status: Home / Self Care | Payer: Medicare HMO | Source: Ambulatory Visit | Attending: Orthopedic Surgery | Admitting: Orthopedic Surgery

## 2023-04-30 ENCOUNTER — Encounter (HOSPITAL_COMMUNITY): Payer: Self-pay

## 2023-04-30 ENCOUNTER — Other Ambulatory Visit: Payer: Self-pay

## 2023-04-30 VITALS — BP 135/50 | HR 83 | Temp 97.7°F | Ht 63.0 in | Wt 183.0 lb

## 2023-04-30 DIAGNOSIS — Z853 Personal history of malignant neoplasm of breast: Secondary | ICD-10-CM | POA: Diagnosis not present

## 2023-04-30 DIAGNOSIS — I700 Atherosclerosis of aorta: Secondary | ICD-10-CM | POA: Diagnosis not present

## 2023-04-30 DIAGNOSIS — M069 Rheumatoid arthritis, unspecified: Secondary | ICD-10-CM | POA: Insufficient documentation

## 2023-04-30 DIAGNOSIS — M75101 Unspecified rotator cuff tear or rupture of right shoulder, not specified as traumatic: Secondary | ICD-10-CM | POA: Diagnosis not present

## 2023-04-30 DIAGNOSIS — Z01818 Encounter for other preprocedural examination: Secondary | ICD-10-CM | POA: Diagnosis not present

## 2023-04-30 DIAGNOSIS — J849 Interstitial pulmonary disease, unspecified: Secondary | ICD-10-CM | POA: Diagnosis not present

## 2023-04-30 DIAGNOSIS — I4510 Unspecified right bundle-branch block: Secondary | ICD-10-CM | POA: Diagnosis not present

## 2023-04-30 DIAGNOSIS — I10 Essential (primary) hypertension: Secondary | ICD-10-CM | POA: Insufficient documentation

## 2023-04-30 DIAGNOSIS — K219 Gastro-esophageal reflux disease without esophagitis: Secondary | ICD-10-CM | POA: Insufficient documentation

## 2023-04-30 DIAGNOSIS — I110 Hypertensive heart disease with heart failure: Secondary | ICD-10-CM | POA: Insufficient documentation

## 2023-04-30 DIAGNOSIS — I7300 Raynaud's syndrome without gangrene: Secondary | ICD-10-CM | POA: Insufficient documentation

## 2023-04-30 DIAGNOSIS — J8410 Pulmonary fibrosis, unspecified: Secondary | ICD-10-CM | POA: Insufficient documentation

## 2023-04-30 DIAGNOSIS — K760 Fatty (change of) liver, not elsewhere classified: Secondary | ICD-10-CM | POA: Insufficient documentation

## 2023-04-30 DIAGNOSIS — I5032 Chronic diastolic (congestive) heart failure: Secondary | ICD-10-CM | POA: Insufficient documentation

## 2023-04-30 DIAGNOSIS — M349 Systemic sclerosis, unspecified: Secondary | ICD-10-CM | POA: Insufficient documentation

## 2023-04-30 HISTORY — DX: Bifascicular block: I45.2

## 2023-04-30 LAB — CBC
HCT: 44.2 % (ref 36.0–46.0)
Hemoglobin: 15.3 g/dL — ABNORMAL HIGH (ref 12.0–15.0)
MCH: 34 pg (ref 26.0–34.0)
MCHC: 34.6 g/dL (ref 30.0–36.0)
MCV: 98.2 fL (ref 80.0–100.0)
Platelets: 306 10*3/uL (ref 150–400)
RBC: 4.5 MIL/uL (ref 3.87–5.11)
RDW: 12.6 % (ref 11.5–15.5)
WBC: 13.9 10*3/uL — ABNORMAL HIGH (ref 4.0–10.5)
nRBC: 0 % (ref 0.0–0.2)

## 2023-04-30 LAB — BASIC METABOLIC PANEL
Anion gap: 10 (ref 5–15)
BUN: 12 mg/dL (ref 8–23)
CO2: 30 mmol/L (ref 22–32)
Calcium: 9.2 mg/dL (ref 8.9–10.3)
Chloride: 99 mmol/L (ref 98–111)
Creatinine, Ser: 0.79 mg/dL (ref 0.44–1.00)
GFR, Estimated: >60 mL/min (ref >60)
Glucose, Bld: 198 mg/dL — ABNORMAL HIGH (ref 70–99)
Potassium: 3.4 mmol/L — ABNORMAL LOW (ref 3.5–5.1)
Sodium: 139 mmol/L (ref 135–145)

## 2023-04-30 NOTE — Progress Notes (Signed)
For Short Stay: COVID SWAB appointment date:  Bowel Prep reminder:   For Anesthesia: PCP - Dr. Carylon Perches Cardiologist - Dr. Olga Millers  Chest x-ray - CT Chest: 08/31/22 EKG - 01/25/23 Stress Test -  ECHO - 07/14/22 Cardiac Cath - 2015 Pacemaker/ICD device last checked: Pacemaker orders received: Device Rep notified:  Spinal Cord Stimulator: N/A  Sleep Study - Yes CPAP - No  Fasting Blood Sugar - N/A Checks Blood Sugar _____ times a day Date and result of last Hgb A1c-  Last dose of GLP1 agonist- N/A GLP1 instructions:   Last dose of SGLT-2 inhibitors- N/A SGLT-2 instructions:   Blood Thinner Instructions: Aspirin Instructions: Will be on hold 5 days before surgery. Last Dose:  Activity level: Can go up a flight of stairs and activities of daily living without stopping and without chest pain and/or shortness of breath   Able to exercise without chest pain and/or shortness of breath  Anesthesia review: CHF,HTN,Right BBB (New)  Patient denies shortness of breath, fever, cough and chest pain at PAT appointment   Patient verbalized understanding of instructions that were given to them at the PAT appointment. Patient was also instructed that they will need to review over the PAT instructions again at home before surgery.

## 2023-05-02 LAB — SURGICAL PCR SCREEN
MRSA, PCR: NEGATIVE
Staphylococcus aureus: POSITIVE — AB

## 2023-05-04 NOTE — Progress Notes (Signed)
PCR: + STAPH °

## 2023-05-04 NOTE — Progress Notes (Signed)
Case: [id number] Date/Time: 05/06/23 1015   Procedure: REVERSE SHOULDER ARTHROPLASTY (Right: Shoulder) -   Anesthesia type: General   Pre-op diagnosis: Right shoulder rotator cuff tear arthropathy   Location: [facility] ROOM 06 / [facility]   Surgeons: Francena Hanly, MD       DISCUSSION: [name] is a 77 year old female who presents to PAT prior to surgery listed above.  Patient was originally scheduled on 01/21/2023 however due to new right bundle branch block on her EKG the anesthesiologist did not feel comfortable with proceeding since cardiology clearance had not been obtained. There was also a request to get pulmonology recommendations for prior to patient receiving a interscalene block. Other PMH significant for ILD associated with scleroderma, pulmonary hypertension, Raynaud's, chronic diastolic heart failure, recent diagnosis of breast cancer and is s/p lumpectomy on 11/04/22.  No prior anesthesia complications.  Patient followed up with cardiology on 01/25/2023 and a cardiac CTA was obtained which showed minimal nonobstructive coronary artery disease.  Cardiac clearance was provided on 02/10/23:  "Coronary calcium score of 419 with minimal nonobstructive coronary disease. Not significant enough to cause symptoms. Recommend secondary prevention - continue Aspirin 81mg  daily and start Rosuvastatin 10mg  daily. FLP/LFT in 6-8 weeks. May proceed with shoulder surgery. May hold Aspirin 5-7 days prior to surgery."  Patient last saw pulmonology on 12/31/2022. At that time Dr. Marchelle Gearing stated: "She is on observation therapy. Overall she states that her shortness of breath and symptoms are the same although she is quite symptomatic. She understands that she has multifactorial dyspnea from obesity, diastolic dysfunction, fitness and ILD. She had a pulmonary function test that is documented below. Shows continued decline in FVC but the DLCO is stable. She could not attend pulmonary rehabilitation because  sometime in the spring I suppose she fractured her left hand. She is willing to reattend pulmonary rehabilitation."  Patient last saw oncology on 02/23/2023.  Noted to be doing well and in remission.  Last saw Rheumatology on 03/15/23. RA is well controlled on Plaquenil and daily Prednisone.   VS: BP (!) 135/50   Pulse 83   Temp 36.5 C (Oral)   Ht 5\' 3"  (1.6 m)   Wt 83 kg   SpO2 94%   BMI 32.42 kg/m   PROVIDERS: Carylon Perches, MD Cardiology: Olga Millers, MD  Pulmonology: Kalman Shan, MD Oncology: Serena Croissant, MD Rheumatology: Alben Deeds, MD  LABS: Labs reviewed: Acceptable for surgery. Has chronic mild leukocytosis likely from chronic steroid use. (all labs ordered are listed, but only abnormal results are displayed)  Labs Reviewed  SURGICAL PCR SCREEN - Abnormal; Notable for the following components:      Result Value   Staphylococcus aureus POSITIVE (*)    All other components within normal limits  BASIC METABOLIC PANEL - Abnormal; Notable for the following components:   Potassium 3.4 (*)    Glucose, Bld 198 (*)    All other components within normal limits  CBC - Abnormal; Notable for the following components:   WBC 13.9 (*)    Hemoglobin 15.3 (*)    All other components within normal limits     IMAGES:  CTA coronary 02/05/23:  IMPRESSION: 1.  Minimal nonobstructive CAD, CADRADS = 1.   2. Coronary calcium score of 419. This was 85th percentile for age and sex matched control.   3. Normal coronary origin with right dominance.   4.  Aortic atherosclerosis.   5.  Elevated right hemidiaphragm   INTERPRETATION:   CAD-RADS 1:  Minimal non-obstructive CAD (1-24%). Consider non-atherosclerotic causes of chest pain. Consider preventive therapy and risk factor modification.  Other findings from CT Chest 02/05/23:  IMPRESSION: 1. Chronic interstitial lung disease, most severely affecting the right middle lobe and lingula, without evidence of  significant progression when compared to the previous CT. 2. Postsurgical changes within the left breast with 3.1 cm fluid collection, which may represent a postoperative seroma or hematoma. Correlate with patient history. 3. Hepatic steatosis.  EKG 01/25/23:  NSR RBBB   CV:  Echo 07/14/22:  IMPRESSIONS     1. Left ventricular ejection fraction, by estimation, is 60 to 65%. The  left ventricle has normal function. The left ventricle has no regional  wall motion abnormalities. Left ventricular diastolic parameters are  consistent with Grade I diastolic  dysfunction (impaired relaxation).   2. Right ventricular systolic function is normal. The right ventricular  size is normal. There is normal pulmonary artery systolic pressure.   3. Left atrial size was mildly dilated.   4. The mitral valve is abnormal. No evidence of mitral valve  regurgitation. No evidence of mitral stenosis.   5. The aortic valve is tricuspid. Aortic valve regurgitation is not  visualized. No aortic stenosis is present.   6. The inferior vena cava is normal in size with greater than 50%  respiratory variability, suggesting right atrial pressure of 3 mmHg.   Lexiscan Stress Test 02/12/21:  Nuclear stress EF: 82%. There was no ST segment deviation noted during stress. No T wave inversion was noted during stress. Normal perfusion with no evidence of ischemia or infarction. The study is normal. This is a low risk study. The left ventricular ejection fraction is hyperdynamic (>65%).   Laurance Flatten, MD  Past Medical History:  Diagnosis Date   Cancer [facility])    Left breast cancer   Diastolic congestive heart failure (HCC)    02/05/15  states no fluid in extremities, breathing same as has been   GERD (gastroesophageal reflux disease)    Hepatic steatosis    History of blood transfusion    Hx of cholecystectomy    Hypertension    Interstitial lung disease (HCC)    Osteoarthritis    Pulmonary  fibrosis (HCC)    Raynaud disease    Rheumatoid arthritis(714.0)    Right BBB/left ant fasc block    Scleroderma (HCC)    Seasonal allergies    Shortness of breath dyspnea    states normal for her   Systemic sclerosis [facility])     Past Surgical History:  Procedure Laterality Date   ABDOMINAL HYSTERECTOMY     BLADDER SUSPENSION     BREAST BIOPSY  11/03/2022   MM LT RADIOACTIVE SEED LOC MAMMO GUIDE 11/03/2022 [facility] MAMMOGRAPHY   BREAST LUMPECTOMY WITH RADIOACTIVE SEED LOCALIZATION Left 11/04/2022   Procedure: LEFT BREAST LUMPECTOMY WITH RADIOACTIVE SEED LOCALIZATION;  Surgeon: Harriette Bouillon, MD;  Location: [facility];  Service: General;  Laterality: Left;   CARPAL TUNNEL RELEASE Right 2007   EYE SURGERY Bilateral 2023   cataract w/ IOL   LAPAROSCOPIC CHOLECYSTECTOMY     LEFT HEART CATHETERIZATION WITH CORONARY/GRAFT ANGIOGRAM  03/15/2014   Procedure: LEFT HEART CATHETERIZATION WITH [name];  Surgeon: Kathleene Hazel, MD;  Location[facility] CATH LAB;  Service: Cardiovascular;;   TONSILLECTOMY     TOTAL KNEE ARTHROPLASTY Left 02/11/2015   Procedure: LEFT TOTAL KNEE ARTHROPLASTY, RIGHT KNEE CORTISONE INJECTION;  Surgeon: Ollen Gross, MD;  Location: [facility];  Service: Orthopedics;  Laterality: Left;  TOTAL KNEE ARTHROPLASTY Right 10/05/2016   Procedure: RIGHT TOTAL KNEE ARTHROPLASTY;  Surgeon: Ollen Gross, MD;  Location: [facility];  Service: Orthopedics;  Laterality: Right;   VESICOVAGINAL FISTULA CLOSURE W/ TAH  11/30/1985    MEDICATIONS:  acetaminophen (TYLENOL) 500 MG tablet   albuterol (VENTOLIN HFA) 108 (90 Base) MCG/ACT inhaler   amLODipine (NORVASC) 5 MG tablet   anastrozole (ARIMIDEX) 1 MG tablet   aspirin EC 81 MG tablet   azelastine (ASTELIN) 0.1 % nasal spray   furosemide (LASIX) 40 MG tablet   Homeopathic Products (ZINC) LOZG   hydroxychloroquine (PLAQUENIL) 200 MG tablet   Lidocaine-Adhesive Sheets (LIDOPURE PATCH EX)   metoprolol tartrate (LOPRESSOR)  100 MG tablet   nortriptyline (PAMELOR) 10 MG capsule   pantoprazole (PROTONIX) 40 MG tablet   Povidone, PF, (IVIZIA DRY EYES) 0.5 % SOLN   predniSONE (DELTASONE) 5 MG tablet   rosuvastatin (CRESTOR) 10 MG tablet   zolpidem (AMBIEN) 10 MG tablet   No current facility-administered medications for this encounter.

## 2023-05-05 NOTE — Anesthesia Preprocedure Evaluation (Addendum)
Anesthesia Evaluation  Patient identified by MRN, date of birth, ID band Patient awake    Reviewed: Allergy & Precautions, NPO status , Patient's Chart, lab work & pertinent test results  Airway Mallampati: II  TM Distance: >3 FB Neck ROM: Full    Dental  (+) Teeth Intact, Dental Advisory Given   Pulmonary former smoker   breath sounds clear to auscultation       Cardiovascular hypertension, Pt. on medications and Pt. on home beta blockers + Peripheral Vascular Disease and +CHF  + dysrhythmias  Rhythm:Regular Rate:Normal     Neuro/Psych negative neurological ROS  negative psych ROS   GI/Hepatic Neg liver ROS,GERD  ,,  Endo/Other  negative endocrine ROS    Renal/GU negative Renal ROS     Musculoskeletal  (+) Arthritis , Rheumatoid disorders,    Abdominal   Peds  Hematology negative hematology ROS (+)   Anesthesia Other Findings   Reproductive/Obstetrics                             Anesthesia Physical Anesthesia Plan  ASA: 3  Anesthesia Plan: General   Post-op Pain Management: Tylenol PO (pre-op)* and Regional block*   Induction: Intravenous  PONV Risk Score and Plan: 4 or greater and Ondansetron and Treatment may vary due to age or medical condition  Airway Management Planned: Oral ETT  Additional Equipment: None  Intra-op Plan:   Post-operative Plan: Extubation in OR  Informed Consent: I have reviewed the patients History and Physical, chart, labs and discussed the procedure including the risks, benefits and alternatives for the proposed anesthesia with the patient or authorized representative who has indicated his/her understanding and acceptance.     Dental advisory given  Plan Discussed with: CRNA  Anesthesia Plan Comments: (See PAT note from 5/31 by K Gekas PA-C. Cardiac clearance obtained. Discussed with Dr. Malen Gauze regarding obtaining pulmonology recs prior to  surgery. Unable to get in touch with Dr. Marchelle Gearing despite multiple attempts. He states ok to proceed and will eval DOS for ability to tolerate a block. )        Anesthesia Quick Evaluation

## 2023-05-06 ENCOUNTER — Ambulatory Visit (HOSPITAL_BASED_OUTPATIENT_CLINIC_OR_DEPARTMENT_OTHER): Payer: Medicare HMO | Admitting: Anesthesiology

## 2023-05-06 ENCOUNTER — Encounter (HOSPITAL_COMMUNITY)
Admission: RE | Disposition: A | Discharge status: Home / Self Care | Payer: Self-pay | Source: Ambulatory Visit | Attending: Orthopedic Surgery

## 2023-05-06 ENCOUNTER — Other Ambulatory Visit: Payer: Self-pay

## 2023-05-06 ENCOUNTER — Ambulatory Visit (HOSPITAL_COMMUNITY)
Admission: RE | Admit: 2023-05-06 | Discharge: 2023-05-06 | Disposition: A | Discharge status: Home / Self Care | Payer: Medicare HMO | Source: Ambulatory Visit | Attending: Orthopedic Surgery | Admitting: Orthopedic Surgery

## 2023-05-06 ENCOUNTER — Encounter (HOSPITAL_COMMUNITY): Payer: Self-pay | Admitting: Orthopedic Surgery

## 2023-05-06 ENCOUNTER — Ambulatory Visit (HOSPITAL_COMMUNITY): Payer: Medicare HMO | Admitting: Physician Assistant

## 2023-05-06 DIAGNOSIS — I509 Heart failure, unspecified: Secondary | ICD-10-CM

## 2023-05-06 DIAGNOSIS — M12811 Other specific arthropathies, not elsewhere classified, right shoulder: Secondary | ICD-10-CM

## 2023-05-06 DIAGNOSIS — I110 Hypertensive heart disease with heart failure: Secondary | ICD-10-CM

## 2023-05-06 DIAGNOSIS — I739 Peripheral vascular disease, unspecified: Secondary | ICD-10-CM | POA: Insufficient documentation

## 2023-05-06 DIAGNOSIS — M75101 Unspecified rotator cuff tear or rupture of right shoulder, not specified as traumatic: Secondary | ICD-10-CM | POA: Insufficient documentation

## 2023-05-06 DIAGNOSIS — I5032 Chronic diastolic (congestive) heart failure: Secondary | ICD-10-CM | POA: Insufficient documentation

## 2023-05-06 DIAGNOSIS — Z87891 Personal history of nicotine dependence: Secondary | ICD-10-CM

## 2023-05-06 DIAGNOSIS — M19011 Primary osteoarthritis, right shoulder: Secondary | ICD-10-CM | POA: Diagnosis not present

## 2023-05-06 DIAGNOSIS — G8918 Other acute postprocedural pain: Secondary | ICD-10-CM | POA: Diagnosis not present

## 2023-05-06 DIAGNOSIS — M25711 Osteophyte, right shoulder: Secondary | ICD-10-CM | POA: Insufficient documentation

## 2023-05-06 HISTORY — PX: REVERSE SHOULDER ARTHROPLASTY: SHX5054

## 2023-05-06 SURGERY — ARTHROPLASTY, SHOULDER, TOTAL, REVERSE
Anesthesia: General | Site: Shoulder | Laterality: Right

## 2023-05-06 MED ORDER — VANCOMYCIN HCL 1000 MG IV SOLR
INTRAVENOUS | Status: AC
  Filled 2023-05-06: qty 20

## 2023-05-06 MED ORDER — ROCURONIUM BROMIDE 100 MG/10ML IV SOLN
INTRAVENOUS | Status: DC | PRN
  Administered 2023-05-06: 60 mg via INTRAVENOUS

## 2023-05-06 MED ORDER — OXYCODONE HCL 5 MG PO TABS: 5.0000 mg | ORAL_TABLET | Freq: Once | ORAL | Status: DC | PRN

## 2023-05-06 MED ORDER — ROCURONIUM BROMIDE 10 MG/ML (PF) SYRINGE
PREFILLED_SYRINGE | INTRAVENOUS | Status: AC
  Filled 2023-05-06: qty 10

## 2023-05-06 MED ORDER — FENTANYL CITRATE PF 50 MCG/ML IJ SOSY
25.0000 ug | PREFILLED_SYRINGE | INTRAMUSCULAR | Status: DC
  Administered 2023-05-06: 50 ug via INTRAVENOUS
  Filled 2023-05-06: qty 2

## 2023-05-06 MED ORDER — FENTANYL CITRATE (PF) 100 MCG/2ML IJ SOLN
INTRAMUSCULAR | Status: AC
  Filled 2023-05-06: qty 2

## 2023-05-06 MED ORDER — ONDANSETRON HCL 4 MG/2ML IJ SOLN
INTRAMUSCULAR | Status: DC | PRN
  Administered 2023-05-06: 4 mg via INTRAVENOUS

## 2023-05-06 MED ORDER — CYCLOBENZAPRINE HCL 10 MG PO TABS: 10.0000 mg | ORAL_TABLET | Freq: Three times a day (TID) | ORAL | 1 refills | Status: DC | PRN

## 2023-05-06 MED ORDER — SUGAMMADEX SODIUM 200 MG/2ML IV SOLN
INTRAVENOUS | Status: DC | PRN
  Administered 2023-05-06: 200 mg via INTRAVENOUS

## 2023-05-06 MED ORDER — LIDOCAINE HCL (PF) 2 % IJ SOLN
INTRAMUSCULAR | Status: AC
  Filled 2023-05-06: qty 5

## 2023-05-06 MED ORDER — MIDAZOLAM HCL 2 MG/2ML IJ SOLN: 0.5000 mg | INTRAMUSCULAR | Status: DC

## 2023-05-06 MED ORDER — LACTATED RINGERS IV SOLN: INTRAVENOUS | Status: DC

## 2023-05-06 MED ORDER — CHLORHEXIDINE GLUCONATE 0.12 % MT SOLN
15.0000 mL | Freq: Once | OROMUCOSAL | Status: AC
  Administered 2023-05-06: 15 mL via OROMUCOSAL

## 2023-05-06 MED ORDER — 0.9 % SODIUM CHLORIDE (POUR BTL) OPTIME
TOPICAL | Status: DC | PRN
  Administered 2023-05-06: 1000 mL

## 2023-05-06 MED ORDER — PHENYLEPHRINE HCL (PRESSORS) 10 MG/ML IV SOLN
INTRAVENOUS | Status: DC | PRN
  Administered 2023-05-06 (×3): 80 ug via INTRAVENOUS

## 2023-05-06 MED ORDER — DEXMEDETOMIDINE HCL IN NACL 80 MCG/20ML IV SOLN
INTRAVENOUS | Status: DC | PRN
  Administered 2023-05-06: 4 ug via INTRAVENOUS

## 2023-05-06 MED ORDER — OXYCODONE-ACETAMINOPHEN 5-325 MG PO TABS: 1.0000 | ORAL_TABLET | ORAL | 0 refills | Status: DC | PRN

## 2023-05-06 MED ORDER — PROPOFOL 10 MG/ML IV BOLUS
INTRAVENOUS | Status: AC
  Filled 2023-05-06: qty 20

## 2023-05-06 MED ORDER — BUPIVACAINE LIPOSOME 1.3 % IJ SUSP
INTRAMUSCULAR | Status: DC | PRN
  Administered 2023-05-06: 10 mL via PERINEURAL

## 2023-05-06 MED ORDER — FENTANYL CITRATE (PF) 100 MCG/2ML IJ SOLN
INTRAMUSCULAR | Status: DC | PRN
  Administered 2023-05-06: 50 ug via INTRAVENOUS

## 2023-05-06 MED ORDER — PHENYLEPHRINE HCL-NACL 20-0.9 MG/250ML-% IV SOLN
INTRAVENOUS | Status: DC | PRN
  Administered 2023-05-06: 30 ug/min via INTRAVENOUS

## 2023-05-06 MED ORDER — ONDANSETRON HCL 4 MG PO TABS: 4.0000 mg | ORAL_TABLET | Freq: Three times a day (TID) | ORAL | 0 refills | Status: DC | PRN

## 2023-05-06 MED ORDER — DEXAMETHASONE SODIUM PHOSPHATE 10 MG/ML IJ SOLN
INTRAMUSCULAR | Status: DC | PRN
  Administered 2023-05-06: 8 mg via INTRAVENOUS

## 2023-05-06 MED ORDER — ACETAMINOPHEN 325 MG PO TABS: 325.0000 mg | ORAL_TABLET | ORAL | Status: DC | PRN

## 2023-05-06 MED ORDER — ACETAMINOPHEN 160 MG/5ML PO SOLN: 325.0000 mg | ORAL | Status: DC | PRN

## 2023-05-06 MED ORDER — LIDOCAINE HCL (CARDIAC) PF 100 MG/5ML IV SOSY
PREFILLED_SYRINGE | INTRAVENOUS | Status: DC | PRN
  Administered 2023-05-06: 50 mg via INTRAVENOUS

## 2023-05-06 MED ORDER — TRANEXAMIC ACID-NACL 1000-0.7 MG/100ML-% IV SOLN
1000.0000 mg | INTRAVENOUS | Status: AC
  Administered 2023-05-06: 1000 mg via INTRAVENOUS
  Filled 2023-05-06: qty 100

## 2023-05-06 MED ORDER — FENTANYL CITRATE PF 50 MCG/ML IJ SOSY: 25.0000 ug | PREFILLED_SYRINGE | INTRAMUSCULAR | Status: DC | PRN

## 2023-05-06 MED ORDER — ACETAMINOPHEN 10 MG/ML IV SOLN: 1000.0000 mg | Freq: Once | INTRAVENOUS | Status: DC | PRN

## 2023-05-06 MED ORDER — PROMETHAZINE HCL 25 MG/ML IJ SOLN: 6.2500 mg | INTRAMUSCULAR | Status: DC | PRN

## 2023-05-06 MED ORDER — OXYCODONE HCL 5 MG/5ML PO SOLN: 5.0000 mg | Freq: Once | ORAL | Status: DC | PRN

## 2023-05-06 MED ORDER — AMISULPRIDE (ANTIEMETIC) 5 MG/2ML IV SOLN: 10.0000 mg | Freq: Once | INTRAVENOUS | Status: DC | PRN

## 2023-05-06 MED ORDER — CEFAZOLIN SODIUM-DEXTROSE 2-4 GM/100ML-% IV SOLN
2.0000 g | INTRAVENOUS | Status: AC
  Administered 2023-05-06: 2 g via INTRAVENOUS
  Filled 2023-05-06: qty 100

## 2023-05-06 MED ORDER — VANCOMYCIN HCL 1000 MG IV SOLR
INTRAVENOUS | Status: DC | PRN
  Administered 2023-05-06: 1000 mg via TOPICAL

## 2023-05-06 MED ORDER — PROPOFOL 10 MG/ML IV BOLUS
INTRAVENOUS | Status: DC | PRN
  Administered 2023-05-06: 130 mg via INTRAVENOUS

## 2023-05-06 MED ORDER — ORAL CARE MOUTH RINSE: 15.0000 mL | Freq: Once | OROMUCOSAL | Status: AC

## 2023-05-06 MED ORDER — DEXAMETHASONE SODIUM PHOSPHATE 10 MG/ML IJ SOLN
INTRAMUSCULAR | Status: AC
  Filled 2023-05-06: qty 1

## 2023-05-06 MED ORDER — ACETAMINOPHEN 500 MG PO TABS: 1000.0000 mg | ORAL_TABLET | Freq: Once | ORAL | Status: DC

## 2023-05-06 MED ORDER — PHENYLEPHRINE HCL-NACL 20-0.9 MG/250ML-% IV SOLN
INTRAVENOUS | Status: AC
  Filled 2023-05-06: qty 250

## 2023-05-06 MED ORDER — BUPIVACAINE HCL (PF) 0.5 % IJ SOLN
INTRAMUSCULAR | Status: DC | PRN
  Administered 2023-05-06: 12 mL via PERINEURAL

## 2023-05-06 MED ORDER — ONDANSETRON HCL 4 MG/2ML IJ SOLN
INTRAMUSCULAR | Status: AC
  Filled 2023-05-06: qty 2

## 2023-05-06 SURGICAL SUPPLY — 70 items
ADH SKN CLS APL DERMABOND .7 (GAUZE/BANDAGES/DRESSINGS) ×1
AID PSTN UNV HD RSTRNT DISP (MISCELLANEOUS) ×1
BAG COUNTER SPONGE SURGICOUNT (BAG) IMPLANT
BAG SPEC THK2 15X12 ZIP CLS (MISCELLANEOUS) ×1
BAG SPNG CNTER NS LX DISP (BAG) ×1
BAG ZIPLOCK 12X15 (MISCELLANEOUS) ×1 IMPLANT
BIT DRILL AR 3 (BIT) ×1
BIT DRILL AR 3 NS (BIT) IMPLANT
BLADE SAW SGTL 83.5X18.5 (BLADE) ×1 IMPLANT
BNDG CMPR 5X4 CHSV STRCH STRL (GAUZE/BANDAGES/DRESSINGS) ×1
BNDG COHESIVE 4X5 TAN STRL LF (GAUZE/BANDAGES/DRESSINGS) ×1 IMPLANT
BSPLAT GLND +2X24 MDLR (Joint) ×1 IMPLANT
COOLER ICEMAN CLASSIC (MISCELLANEOUS) ×1 IMPLANT
COVER BACK TABLE 60X90IN (DRAPES) ×1 IMPLANT
COVER SURGICAL LIGHT HANDLE (MISCELLANEOUS) ×1 IMPLANT
CUP SUT UNIV REVERS 36 NEUTRAL (Cup) IMPLANT
DERMABOND ADVANCED .7 DNX12 (GAUZE/BANDAGES/DRESSINGS) ×1 IMPLANT
DRAPE ORTHO SPLIT 77X108 STRL (DRAPES) ×2
DRAPE SHEET LG 3/4 BI-LAMINATE (DRAPES) ×1 IMPLANT
DRAPE SURG 17X11 SM STRL (DRAPES) ×1 IMPLANT
DRAPE SURG ORHT 6 SPLT 77X108 (DRAPES) ×2 IMPLANT
DRAPE TOP 10253 STERILE (DRAPES) ×1 IMPLANT
DRAPE U-SHAPE 47X51 STRL (DRAPES) ×1 IMPLANT
DRESSING AQUACEL AG SP 3.5X6 (GAUZE/BANDAGES/DRESSINGS) ×1 IMPLANT
DRSG AQUACEL AG ADV 3.5X 6 (GAUZE/BANDAGES/DRESSINGS) IMPLANT
DRSG AQUACEL AG ADV 3.5X10 (GAUZE/BANDAGES/DRESSINGS) IMPLANT
DRSG AQUACEL AG SP 3.5X6 (GAUZE/BANDAGES/DRESSINGS) ×1
DURAPREP 26ML APPLICATOR (WOUND CARE) ×1 IMPLANT
ELECT BLADE TIP CTD 4 INCH (ELECTRODE) ×1 IMPLANT
ELECT PENCIL ROCKER SW 15FT (MISCELLANEOUS) ×1 IMPLANT
ELECT REM PT RETURN 15FT ADLT (MISCELLANEOUS) ×1 IMPLANT
FACESHIELD WRAPAROUND (MASK) ×5 IMPLANT
FACESHIELD WRAPAROUND OR TEAM (MASK) ×5 IMPLANT
GLENOID UNI REV MOD 24 +2 LAT (Joint) IMPLANT
GLENOSPHERE 36 +4 LAT/24 (Joint) IMPLANT
GLOVE BIO SURGEON STRL SZ7.5 (GLOVE) ×1 IMPLANT
GLOVE BIO SURGEON STRL SZ8 (GLOVE) ×1 IMPLANT
GLOVE SS BIOGEL STRL SZ 7 (GLOVE) ×1 IMPLANT
GLOVE SS BIOGEL STRL SZ 7.5 (GLOVE) ×1 IMPLANT
GOWN STRL SURGICAL XL XLNG (GOWN DISPOSABLE) ×2 IMPLANT
INSERT HUMERAL UNI REVERS 36 3 (Insert) IMPLANT
KIT BASIN OR (CUSTOM PROCEDURE TRAY) ×1 IMPLANT
KIT TURNOVER KIT A (KITS) IMPLANT
MANIFOLD NEPTUNE II (INSTRUMENTS) ×1 IMPLANT
NDL TAPERED W/ NITINOL LOOP (MISCELLANEOUS) ×1 IMPLANT
NEEDLE TAPERED W/ NITINOL LOOP (MISCELLANEOUS) ×1 IMPLANT
NS IRRIG 1000ML POUR BTL (IV SOLUTION) ×1 IMPLANT
PACK SHOULDER (CUSTOM PROCEDURE TRAY) ×1 IMPLANT
PAD ARMBOARD 7.5X6 YLW CONV (MISCELLANEOUS) ×1 IMPLANT
PAD COLD SHLDR WRAP-ON (PAD) ×1 IMPLANT
PIN NITINOL TARGETER 2.8 (PIN) IMPLANT
PIN SET MODULAR GLENOID SYSTEM (PIN) IMPLANT
RESTRAINT HEAD UNIVERSAL NS (MISCELLANEOUS) ×1 IMPLANT
SCREW CENTRAL MOD 30MM (Screw) IMPLANT
SCREW PERI LOCK 5.5X24 (Screw) IMPLANT
SCREW PERIPHERAL 5.5X20 LOCK (Screw) IMPLANT
SCREW PERIPHERAL 5.5X28 LOCK (Screw) IMPLANT
SLING ARM FOAM STRAP LRG (SOFTGOODS) IMPLANT
SLING ARM FOAM STRAP MED (SOFTGOODS) IMPLANT
STEM HUMERAL UNI REVERS SZ9 (Stem) IMPLANT
SUT MNCRL AB 3-0 PS2 18 (SUTURE) ×1 IMPLANT
SUT MON AB 2-0 CT1 36 (SUTURE) ×1 IMPLANT
SUT VIC AB 1 CT1 36 (SUTURE) ×1 IMPLANT
SUTURE TAPE 1.3 40 TPR END (SUTURE) ×2 IMPLANT
SUTURETAPE 1.3 40 TPR END (SUTURE) ×2
TOWEL OR 17X26 10 PK STRL BLUE (TOWEL DISPOSABLE) ×1 IMPLANT
TOWEL OR NON WOVEN STRL DISP B (DISPOSABLE) ×1 IMPLANT
TUBE SUCTION HIGH CAP CLEAR NV (SUCTIONS) ×1 IMPLANT
TUBING CONNECTING 10 (TUBING) IMPLANT
WATER STERILE IRR 1000ML POUR (IV SOLUTION) ×2 IMPLANT

## 2023-05-06 NOTE — Evaluation (Signed)
Occupational Therapy Evaluation Patient Details Name: [name] MRN: [id number] DOB: [birth year] Today's Date: 05/06/2023   History of Present Illness [name] is a 77 yr old female who is s/p a R shoulder rotator cuff arthoplasty on 05-06-23, due to rotator cuff tear arthropathy.   Clinical Impression   Pt is s/p shoulder replacement without functional use of right dominant upper extremity, secondary to effects of surgery, interscalene block, and shoulder precautions. Therapist provided education and instruction to patient and spouse with regards to UE ROM/exercises, post-op precautions, UE positioning, donning upper extremity clothing, recommendations for bathing while maintaining shoulder precautions, use of ice for pain and edema management, use of ice machine, and correctly donning/doffing sling. Patient and spouse verbalized and demonstrated understanding as needed. Patient needed assistance to donn shirt, underwear, pants, and shoes with instruction provided on compensatory strategies to perform ADLs. Patient to follow up with MD for further therapy needs.        Recommendations for follow up therapy are one component of a multi-disciplinary discharge planning process, led by the attending physician.  Recommendations may be updated based on patient status, additional functional criteria and insurance authorization.   Assistance Recommended at Discharge Intermittent Supervision/Assistance  Patient can return home with the following Assistance with cooking/housework;Help with stairs or ramp for entrance;Assist for transportation;A little help with bathing/dressing/bathroom    Functional Status Assessment  Patient has had a recent decline in their functional status and demonstrates the ability to make significant improvements in function in a reasonable and predictable amount of time.  Equipment Recommendations  None recommended by OT       Precautions / Restrictions  Precautions Precautions: Shoulder Type of Shoulder Precautions: If sitting in controlled environment, ok to come out of sling to give neck a break. Please sleep in it to protect until follow up in office.     OK to use operative arm for feeding, hygiene and ADLs.   Ok to instruct Pendulums and lap slides as exercises. Ok to use operative arm within the following parameters for ADL purposes     New ROM (4/09)   Ok for PROM, AAROM, AROM within pain tolerance and within the following ROM   ER 20   ABD 45   FE 60 Shoulder Interventions: Shoulder sling/immobilizer Precaution Booklet Issued: Yes (comment) Required Braces or Orthoses: Sling Restrictions Weight Bearing Restrictions: Yes RUE Weight Bearing: Non weight bearing Other Position/Activity Restrictions: Okay to perfomr RUE hand, wrist, and elbow ROM. RUE sling to worn at all times, except ADLs and exercise      Mobility Bed Mobility      General bed mobility comments: pt was received seated in bedside chair    Transfers Overall transfer level: Needs assistance   Transfers: Sit to/from Stand Sit to Stand: Supervision                  Balance Overall balance assessment: No apparent balance deficits (not formally assessed)           ADL either performed or assessed with clinical judgement      Vision Baseline Vision/History: 1 Wears glasses Additional Comments: no reported change from baseline            Pertinent Vitals/Pain Pain Assessment Pain Assessment: No/denies pain     Hand Dominance Right      Communication Communication Communication: No difficulties   Cognition Arousal/Alertness: Awake/alert Behavior During Therapy: WFL for tasks assessed/performed Overall Cognitive Status: Within Functional Limits for tasks  assessed          General Comments: Oriented x4, able to follow commands           Shoulder Instructions Shoulder Instructions Donning/doffing shirt without moving shoulder:  Moderate assistance Method for sponge bathing under operated UE: Patient able to independently direct caregiver Donning/doffing sling/immobilizer: Moderate assistance Correct positioning of sling/immobilizer: Moderate assistance Pendulum exercises (written home exercise program): Patient able to independently direct caregiver ROM for elbow, wrist and digits of operated UE: Patient able to independently direct caregiver Sling wearing schedule (on at all times/off for ADL's): Patient able to independently direct caregiver Proper positioning of operated UE when showering: Patient able to independently direct caregiver Positioning of UE while sleeping: Patient able to independently direct caregiver    Home Living Family/patient expects to be discharged to:: Private residence Living Arrangements: Spouse/significant other Available Help at Discharge: Family Type of Home: House Home Access: Stairs to enter Secretary/administrator of Steps: 3 Entrance Stairs-Rails: Left;Right Home Layout: Two level;Able to live on main level with bedroom/bathroom     Bathroom Shower/Tub: Walk-in shower;Tub/shower unit   Bathroom Toilet: Handicapped height     Home Equipment: Cane - single Information systems manager (2 wheels)          Prior Functioning/Environment Prior Level of Function : Independent/Modified Independent;Driving             Mobility Comments: She was independent with ambulation. ADLs Comments: She was independent with ADLs, cooking, cleaning, and driving.        OT Problem List: Impaired UE functional use      OT Treatment/Interventions:   No further OT treatment needs identified      OT Frequency:  N/A       AM-PAC OT "6 Clicks" Daily Activity     Outcome Measure Help from another person eating meals?: None Help from another person taking care of personal grooming?: None Help from another person toileting, which includes using toliet, bedpan, or urinal?: A  Little Help from another person bathing (including washing, rinsing, drying)?: A Little Help from another person to put on and taking off regular upper body clothing?: A Little Help from another person to put on and taking off regular lower body clothing?: A Little 6 Click Score: 20   End of Session Equipment Utilized During Treatment: Other (comment) (none) Nurse Communication: Other (comment) (Shoulder education completed)  Activity Tolerance: Patient tolerated treatment well Patient left: in chair;with call bell/phone within reach;with family/visitor present  OT Visit Diagnosis: Muscle weakness (generalized) (M62.81);Pain                Time: 1610-9604 OT Time Calculation (min): 33 min Charges:  OT General Charges $OT Visit: 1 Visit OT Evaluation $OT Eval Low Complexity: 1 Low OT Treatments $Self Care/Home Management : 8-22 mins    Reuben Likes, OTR/L 05/06/2023, 3:13 PM

## 2023-05-06 NOTE — Op Note (Signed)
05/06/2023  11:57 AM  PATIENT:   [name]  77 y.o. female  PRE-OPERATIVE DIAGNOSIS:  Right shoulder rotator cuff tear arthropathy  POST-OPERATIVE DIAGNOSIS: Same  PROCEDURE: Right shoulder reverse arthroplasty utilizing a press-fit size 9 Arthrex stem with a neutral metathesis, +3 constrained polyethylene insert, 36/+4 glenosphere and a small/+2 baseplate  SURGEON:  Rodric Punch, Vania Rea M.D.  ASSISTANTS: Ralene Bathe, PA-C  Ralene Bathe, PA-C was utilized as an Geophysicist/field seismologist throughout this case, essential for help with positioning the patient, positioning extremity, tissue manipulation, implantation of the prosthesis, suture management, wound closure, and intraoperative decision-making.  ANESTHESIA:   General endotracheal and interscalene block with Exparel  EBL: 100 cc  SPECIMEN: None  Drains: None   PATIENT DISPOSITION:  PACU - hemodynamically stable.    PLAN OF CARE: Discharge to home after PACU  Brief history:  Patient is a 77 year old female with chronic and progressive increasing right shoulder pain related to rotator cuff tear arthropathy.  Due to her increasing functional imitations and failure to respond to prolonged attempts at conservative management, she is brought to the operating this time for planned right shoulder reverse arthroplasty.  Preoperatively, I counseled the patient regarding treatment options and risks versus benefits thereof.  Possible surgical complications were all reviewed including potential for bleeding, infection, neurovascular injury, persistent pain, loss of motion, anesthetic complication, failure of the implant, and possible need for additional surgery. They understand and accept and agrees with our planned procedure.   Procedure in detail:  After undergoing routine preop evaluation the patient received prophylactic antibiotics and interscalene block with Exparel was established in the holding area by the anesthesia department.   Subsequently placed spine on the operating table and underwent the smooth induction of a general endotracheal anesthesia.  Placed into the beachchair position and appropriately padded and protected.  Right shoulder girdle region was sterilely prepped and draped in standard fashion.  Timeout was called.  A deltopectoral approach to the right shoulder is made an approximately 8 cm incision.  Skin flaps elevated dissection carried deeply the deltopectoral interval was then developed from proximal to distal with the vein taken laterally.  The conjoined tendon was mobilized and retracted medially and adhesions were divided beneath the deltoid.  The long head biceps tendon was then tenodesed at the upper border the pectoralis major tendon with proximal segment unroofed and excised.  Subscapularis was then separated from the lesser tuberosity using electrocautery and the superior remnant of the rotator cuff was divided to the base of the coracoid from the apex of the bicipital groove and the subscap was then tagged with a pair of grasping suture tape sutures.  Capsular attachments were then divided from the anterior and inferior margins of the humeral neck allowing delivery of the humeral head through the wound.  An extra medullary guide was then used to outline the proposed humeral head resection which we performed with an oscillating saw at approximately 20 degrees of retroversion.  Marginal osteophytes were removed with a rondure and a metal cap was then placed over the cut proximal humeral surface.  Glenoid was then exposed and a circumferential labral resection was performed.  Guidepin was then directed into the center of the glenoid and glenoid was then reamed with the central followed by the peripheral reamer to stable [place] bony bed.  Preparation completed for a 30 mm lag screw with central drill and tap.  Our baseplate was then assembled with vancomycin powder applied to the threads of the lag screw  and the  baseplate was inserted with excellent fixation.  The peripheral locking screws were all then placed using standard technique with excellent fixation.  A 36/+4 glenosphere was then impacted onto the baseplate and a central locking screw was placed.  Attention returned to the proximal humerus where the canal was opened we ultimately broached up to a size 9 stem at 20 degrees of retroversion.  A neutral metaphyseal reaming guide was then used to prepare the metaphysis.  A trial implant was then placed and trial reduction showed excellent motion stability and soft tissue balance.  Our trial was then removed.  The final implant was assembled the canal was irrigated cleaned and dried and vancomycin powder was then sprayed into the canal a final implant was then seated with excellent fixation.  Trial reduction was then performed showing best soft tissue balance with a +3 poly.  We removed the trial poly and impacted a +3 constrained poly insert.  Final reduction showed excellent motion and stability and soft tissue balance all much to our satisfaction.  The joint was then copiously irrigated.  Final hemostasis was obtained.  We confirmed good elasticity of the subscapularis and was repaired back to the eyelets on the collar the implant using the previously placed suture tape sutures.  Balance of the vancomycin powder was then sprayed liberally throughout the deep soft tissue planes.  The deltopectoral interval was reapproximated with a series of figure-of-eight number Vicryl sutures.  2-0 Monocryl used to close the subcu layer and intracuticular 3-0 Monocryl used to close the skin followed by Dermabond and Aquacel dressing.  The right arm was placed into a sling.  The patient was awakened, extubated, and taken to the recovery room in stable condition.  Senaida Lange MD   Contact # [phone]

## 2023-05-06 NOTE — Care Plan (Signed)
Ortho Bundle Case Management Note  Patient Details  Name: [name] MRN: [id number] Date of Birth: 09-27-1946                  R Rev TSA on 05/06/23 DCP: Home with husband  DME: No needs  PT: Kevan Ny   DME Arranged:  N/A DME Agency:      Additional Comments: Please contact me with any questions of if this plan should need to change.  Ennis Forts, RN,CCM [facility]  [phone] 05/06/2023, 12:36 PM

## 2023-05-06 NOTE — Discharge Instructions (Signed)

## 2023-05-06 NOTE — Anesthesia Postprocedure Evaluation (Signed)
Anesthesia Post Note  Patient: [name]  Procedure(s) Performed: REVERSE SHOULDER ARTHROPLASTY (Right: Shoulder)     Patient location during evaluation: PACU Anesthesia Type: General Level of consciousness: awake and alert Pain management: pain level controlled Vital Signs Assessment: post-procedure vital signs reviewed and stable Respiratory status: spontaneous breathing, nonlabored ventilation, respiratory function stable and patient connected to nasal cannula oxygen Cardiovascular status: blood pressure returned to baseline and stable Postop Assessment: no apparent nausea or vomiting Anesthetic complications: no  No notable events documented.  Last Vitals:  Vitals:   05/06/23 1300 05/06/23 1315  BP: 125/60 135/67  Pulse: 78 79  Resp: 19 18  Temp:    SpO2: 100% 100%    Last Pain:  Vitals:   05/06/23 1315  PainSc: 0-No pain                 Shelton Silvas

## 2023-05-06 NOTE — H&P (Signed)
[name]    Chief Complaint: Right shoulder rotator cuff tear arthropathy HPI: The patient is a 77 y.o. female with chronic and progressive increasing right shoulder pain related to severe rotator cuff tear arthropathy.  Due to her increasing functional limitations and failure to respond to prolonged attempts at conservative management, she is brought to the operating this time for planned right shoulder reverse arthroplasty  Past Medical History:  Diagnosis Date   Cancer [facility])    Left breast cancer   Diastolic congestive heart failure (HCC)    02/05/15  states no fluid in extremities, breathing same as has been   GERD (gastroesophageal reflux disease)    Hepatic steatosis    History of blood transfusion    Hx of cholecystectomy    Hypertension    Interstitial lung disease (HCC)    Osteoarthritis    Pulmonary fibrosis (HCC)    Raynaud disease    Rheumatoid arthritis(714.0)    Right BBB/left ant fasc block    Scleroderma (HCC)    Seasonal allergies    Shortness of breath dyspnea    states normal for her   Systemic sclerosis [facility])       Past Surgical History:  Procedure Laterality Date   ABDOMINAL HYSTERECTOMY     BLADDER SUSPENSION     BREAST BIOPSY  11/03/2022   MM LT RADIOACTIVE SEED LOC MAMMO GUIDE 11/03/2022 [facility] MAMMOGRAPHY   BREAST LUMPECTOMY WITH RADIOACTIVE SEED LOCALIZATION Left 11/04/2022   Procedure: LEFT BREAST LUMPECTOMY WITH RADIOACTIVE SEED LOCALIZATION;  Surgeon: Harriette Bouillon, MD;  Location: [facility] OR;  Service: General;  Laterality: Left;   CARPAL TUNNEL RELEASE Right 2007   EYE SURGERY Bilateral 2023   cataract w/ IOL   LAPAROSCOPIC CHOLECYSTECTOMY     LEFT HEART CATHETERIZATION WITH CORONARY/GRAFT ANGIOGRAM  03/15/2014   Procedure: LEFT HEART CATHETERIZATION WITH [name];  Surgeon: Kathleene Hazel, MD;  Location: [facility] CATH LAB;  Service: Cardiovascular;;   TONSILLECTOMY     TOTAL KNEE ARTHROPLASTY Left 02/11/2015    Procedure: LEFT TOTAL KNEE ARTHROPLASTY, RIGHT KNEE CORTISONE INJECTION;  Surgeon: Ollen Gross, MD;  Location: [facility] ORS;  Service: Orthopedics;  Laterality: Left;   TOTAL KNEE ARTHROPLASTY Right 10/05/2016   Procedure: RIGHT TOTAL KNEE ARTHROPLASTY;  Surgeon: Ollen Gross, MD;  Location: [facility] ORS;  Service: Orthopedics;  Laterality: Right;   VESICOVAGINAL FISTULA CLOSURE W/ TAH  11/30/1985    Family History  Problem Relation Age of Onset   Stroke Mother    Heart disease Mother        CABG at age 58   Heart attack Father        Died of MI at age 48   Emphysema Father    Dementia Brother    Osteoarthritis Other    Celiac disease Other     Social History:  reports that she quit smoking about 34 years ago. Her smoking use included cigarettes. She has a 20.00 pack-year smoking history. She has never used smokeless tobacco. She reports that she does not drink alcohol and does not use drugs.  BMI: Estimated body mass index is 32.42 kg/m as calculated from the following:   Height as of 04/30/23: 5\' 3"  (1.6 m).   Weight as of 04/30/23: 83 kg.  Lab Results  Component Value Date   ALBUMIN 3.9 01/06/2023   Diabetes: Patient does not have a diagnosis of diabetes.     Smoking Status:       No medications prior to admission.  Physical Exam: Right shoulder demonstrates painful and guarded motion is noted at recent office visits.  She remains neurovascular intact in the right upper extremity.  Imaging studies confirmed changes consistent with chronic right shoulder rotator cuff tear arthropathy.  Vitals     Assessment/Plan  Impression: Right shoulder rotator cuff tear arthropathy  Plan of Action: Procedure(s): REVERSE SHOULDER ARTHROPLASTY  [name] M [name] 05/06/2023, 6:43 AM Contact # [phone]

## 2023-05-06 NOTE — Transfer of Care (Signed)
Immediate Anesthesia Transfer of Care Note  Patient: [name]  Procedure(s) Performed: REVERSE SHOULDER ARTHROPLASTY (Right: Shoulder)  Patient Location: PACU  Anesthesia Type:General  Level of Consciousness: awake, alert , oriented, and patient cooperative  Airway & Oxygen Therapy: Patient Spontanous Breathing and Patient connected to face mask oxygen  Post-op Assessment: Report given to RN and Post -op Vital signs reviewed and stable  Post vital signs: Reviewed and stable  Last Vitals:  Vitals Value Taken Time  BP 146/66 05/06/23 1218  Temp    Pulse 82 05/06/23 1219  Resp 23 05/06/23 1220  SpO2 100 % 05/06/23 1219  Vitals shown include unvalidated device data.  Last Pain:  Vitals:   05/06/23 1011  PainSc: 0-No pain         Complications: No notable events documented.

## 2023-05-06 NOTE — Anesthesia Procedure Notes (Signed)
Anesthesia Regional Block: Interscalene brachial plexus block   Pre-Anesthetic Checklist: , timeout performed,  Correct Patient, Correct Site, Correct Laterality,  Correct Procedure, Correct Position, site marked,  Risks and benefits discussed,  Surgical consent,  Pre-op evaluation,  At surgeon's request and post-op pain management  Laterality: Right  Prep: chloraprep       Needles:  Injection technique: Single-shot  Needle Type: Echogenic Stimulator Needle     Needle Length: 9cm  Needle Gauge: 21     Additional Needles:   Procedures:,,,, ultrasound used (permanent image in chart),,    Narrative:  Start time: 05/06/2023 10:00 AM End time: 05/06/2023 10:05 AM Injection made incrementally with aspirations every 5 mL.  Performed by: Personally  Anesthesiologist: Shelton Silvas, MD  Additional Notes: Discussed risks and benefits of the nerve block in detail, including but not limited vascular injury, permanent nerve damage and infection.   Patient tolerated the procedure well. Local anesthetic introduced in an incremental fashion under minimal resistance after negative aspirations. No paresthesias were elicited. After completion of the procedure, no acute issues were identified and patient continued to be monitored by RN.

## 2023-05-06 NOTE — Anesthesia Procedure Notes (Signed)
Procedure Name: Intubation Date/Time: 05/06/2023 10:44 AM  Performed by: Garth Bigness, CRNAPre-anesthesia Checklist: Patient identified, Emergency Drugs available, Suction available and Patient being monitored Patient Re-evaluated:Patient Re-evaluated prior to induction Oxygen Delivery Method: Circle system utilized Preoxygenation: Pre-oxygenation with 100% oxygen Induction Type: IV induction Ventilation: Mask ventilation without difficulty Laryngoscope Size: Mac and 3 Grade View: Grade II Tube type: Oral Tube size: 7.0 mm Number of attempts: 1 Airway Equipment and Method: Stylet Placement Confirmation: ETT inserted through vocal cords under direct vision, positive ETCO2 and breath sounds checked- equal and bilateral Secured at: 22 cm Tube secured with: Tape Dental Injury: Teeth and Oropharynx as per pre-operative assessment

## 2023-05-08 ENCOUNTER — Other Ambulatory Visit: Payer: Self-pay

## 2023-05-10 ENCOUNTER — Encounter (HOSPITAL_COMMUNITY): Payer: Self-pay | Admitting: Orthopedic Surgery

## 2023-05-19 DIAGNOSIS — Z96611 Presence of right artificial shoulder joint: Secondary | ICD-10-CM | POA: Diagnosis not present

## 2023-05-19 DIAGNOSIS — Z471 Aftercare following joint replacement surgery: Secondary | ICD-10-CM | POA: Diagnosis not present

## 2023-06-11 DIAGNOSIS — M25611 Stiffness of right shoulder, not elsewhere classified: Secondary | ICD-10-CM | POA: Diagnosis not present

## 2023-06-11 DIAGNOSIS — M25511 Pain in right shoulder: Secondary | ICD-10-CM | POA: Diagnosis not present

## 2023-06-18 DIAGNOSIS — M25611 Stiffness of right shoulder, not elsewhere classified: Secondary | ICD-10-CM | POA: Diagnosis not present

## 2023-06-18 DIAGNOSIS — M25511 Pain in right shoulder: Secondary | ICD-10-CM | POA: Diagnosis not present

## 2023-06-22 DIAGNOSIS — M25611 Stiffness of right shoulder, not elsewhere classified: Secondary | ICD-10-CM | POA: Diagnosis not present

## 2023-06-22 DIAGNOSIS — M25511 Pain in right shoulder: Secondary | ICD-10-CM | POA: Diagnosis not present

## 2023-06-25 DIAGNOSIS — M25611 Stiffness of right shoulder, not elsewhere classified: Secondary | ICD-10-CM | POA: Diagnosis not present

## 2023-06-25 DIAGNOSIS — M25511 Pain in right shoulder: Secondary | ICD-10-CM | POA: Diagnosis not present

## 2023-06-29 DIAGNOSIS — M25511 Pain in right shoulder: Secondary | ICD-10-CM | POA: Diagnosis not present

## 2023-06-29 DIAGNOSIS — M25611 Stiffness of right shoulder, not elsewhere classified: Secondary | ICD-10-CM | POA: Diagnosis not present

## 2023-07-01 ENCOUNTER — Ambulatory Visit: Payer: Medicare HMO | Admitting: Podiatry

## 2023-07-01 DIAGNOSIS — D2372 Other benign neoplasm of skin of left lower limb, including hip: Secondary | ICD-10-CM

## 2023-07-01 DIAGNOSIS — D2371 Other benign neoplasm of skin of right lower limb, including hip: Secondary | ICD-10-CM | POA: Diagnosis not present

## 2023-07-01 NOTE — Progress Notes (Signed)
She presents today chief complaint of painful calluses bilateral subfifth metatarsals.  Objective: Vital signs stable oriented x 3.  There is no erythema edema cellulitis drainage or odor she has reactive hyper keratoma plantar aspect fifth metatarsal bilateral.  Tender to the touch no open lesions or wounds.  No preulcerative lesions.  Assessment: Benign skin lesion subfifth bilateral  Plan: Debrided benign skin

## 2023-07-02 DIAGNOSIS — M25511 Pain in right shoulder: Secondary | ICD-10-CM | POA: Diagnosis not present

## 2023-07-02 DIAGNOSIS — M25611 Stiffness of right shoulder, not elsewhere classified: Secondary | ICD-10-CM | POA: Diagnosis not present

## 2023-07-06 DIAGNOSIS — M25611 Stiffness of right shoulder, not elsewhere classified: Secondary | ICD-10-CM | POA: Diagnosis not present

## 2023-07-06 DIAGNOSIS — M25511 Pain in right shoulder: Secondary | ICD-10-CM | POA: Diagnosis not present

## 2023-07-15 DIAGNOSIS — M25611 Stiffness of right shoulder, not elsewhere classified: Secondary | ICD-10-CM | POA: Diagnosis not present

## 2023-07-15 DIAGNOSIS — M25511 Pain in right shoulder: Secondary | ICD-10-CM | POA: Diagnosis not present

## 2023-07-20 DIAGNOSIS — M25511 Pain in right shoulder: Secondary | ICD-10-CM | POA: Diagnosis not present

## 2023-07-20 DIAGNOSIS — M25611 Stiffness of right shoulder, not elsewhere classified: Secondary | ICD-10-CM | POA: Diagnosis not present

## 2023-07-23 DIAGNOSIS — M25511 Pain in right shoulder: Secondary | ICD-10-CM | POA: Diagnosis not present

## 2023-07-23 DIAGNOSIS — M25611 Stiffness of right shoulder, not elsewhere classified: Secondary | ICD-10-CM | POA: Diagnosis not present

## 2023-07-28 DIAGNOSIS — Z96611 Presence of right artificial shoulder joint: Secondary | ICD-10-CM | POA: Diagnosis not present

## 2023-07-28 DIAGNOSIS — Z471 Aftercare following joint replacement surgery: Secondary | ICD-10-CM | POA: Diagnosis not present

## 2023-08-18 ENCOUNTER — Telehealth: Payer: Self-pay | Admitting: Internal Medicine

## 2023-08-18 NOTE — Telephone Encounter (Signed)
Front desk  Pls give spirometr/dlco and ov with me 15 or 30 min for ILD please  Thanks    SIGNATURE    Dr. Kalman Shan, M.D., F.C.C.P,  Pulmonary and Critical Care Medicine Staff Physician, [facility] Director - Interstitial Lung Disease  Program  Pulmonary Fibrosis [facility] at [facility] [place], [place], [address]   Pager: [phone], If no answer  -> Check AMION or Try [phone] Telephone (clinical office): [phone] Telephone (research): [phone]  3:05 PM 08/18/2023

## 2023-08-26 ENCOUNTER — Inpatient Hospital Stay: Payer: Medicare HMO | Admitting: Hematology and Oncology

## 2023-08-26 NOTE — Assessment & Plan Note (Deleted)
09/18/2022:Screening mammogram detected left breast mass at 2 o'clock position measuring 0.5 cm, axilla negative, biopsy revealed grade 1 IDC with focal extracellular mucin plus grade 1 DCIS ER 100%, PR 99%, Ki-67 5%, HER2 1+, FISH negative  11/04/22: Left lumpectomy: Grade 1 IDC 0.8 cm, margins negative, ER 100%, PR 99%, HER2 negative 1+, Ki-67 5%  Treatment plan: Radiation was not done because of history of scleroderma. Antiestrogen therapy with anastrozole 1 mg daily x 5 years started 12/01/2022 Anastrozole toxicities:  Breast cancer surveillance: Mammogram scheduled for 08/31/2023 Breast exam 08/26/2023: Benign  Return to clinic in 1 year for follow-up

## 2023-08-26 NOTE — Assessment & Plan Note (Signed)
09/18/2022:Screening mammogram detected left breast mass at 2 o'clock position measuring 0.5 cm, axilla negative, biopsy revealed grade 1 IDC with focal extracellular mucin plus grade 1 DCIS ER 100%, PR 99%, Ki-67 5%, HER2 1+, FISH negative  11/04/22: Left lumpectomy: Grade 1 IDC 0.8 cm, margins negative, ER 100%, PR 99%, HER2 negative 1+, Ki-67 5%  Treatment plan: Radiation is not being planned because of history of scleroderma. Current treatment: Antiestrogen therapy with anastrozole 1 mg daily x 5 years started 12/01/2022 Anastrozole toxicities:  Breast cancer surveillance: Mammogram scheduled for 08/31/2023 Breast exam 08/30/2023: Benign  Return to clinic in 1 year for follow-up

## 2023-08-30 ENCOUNTER — Inpatient Hospital Stay: Payer: Medicare HMO | Attending: Hematology and Oncology | Admitting: Hematology and Oncology

## 2023-08-30 VITALS — BP 158/61 | HR 95 | Temp 97.2°F | Resp 18 | Ht 63.0 in | Wt 182.1 lb

## 2023-08-30 DIAGNOSIS — Z170 Estrogen receptor positive status [ER+]: Secondary | ICD-10-CM

## 2023-08-31 ENCOUNTER — Ambulatory Visit
Admission: RE | Admit: 2023-08-31 | Discharge: 2023-08-31 | Disposition: A | Discharge status: Home / Self Care | Payer: Medicare HMO | Source: Ambulatory Visit | Attending: Adult Health | Admitting: Adult Health

## 2023-08-31 DIAGNOSIS — Z170 Estrogen receptor positive status [ER+]: Secondary | ICD-10-CM | POA: Diagnosis not present

## 2023-08-31 DIAGNOSIS — Z853 Personal history of malignant neoplasm of breast: Secondary | ICD-10-CM | POA: Diagnosis not present

## 2023-08-31 DIAGNOSIS — C50412 Malignant neoplasm of upper-outer quadrant of left female breast: Secondary | ICD-10-CM | POA: Diagnosis not present

## 2023-08-31 DIAGNOSIS — I7300 Raynaud's syndrome without gangrene: Secondary | ICD-10-CM | POA: Diagnosis not present

## 2023-08-31 DIAGNOSIS — I272 Pulmonary hypertension, unspecified: Secondary | ICD-10-CM | POA: Diagnosis not present

## 2023-08-31 DIAGNOSIS — M1990 Unspecified osteoarthritis, unspecified site: Secondary | ICD-10-CM | POA: Diagnosis not present

## 2023-09-01 NOTE — Progress Notes (Unsigned)
PHONE VISIT    Patient Care Team: Carylon Perches, MD as PCP - General (Internal Medicine) Jens Som Madolyn Frieze, MD as PCP - Cardiology (Cardiology) Storm Frisk, MD as Attending Physician (Pulmonary Disease) Harriette Bouillon, MD as Consulting Physician (General Surgery) Serena Croissant, MD as Consulting Physician (Hematology and Oncology) Dorothy Puffer, MD as Consulting Physician (Radiation Oncology)  DIAGNOSIS:  Encounter Diagnosis  Name Primary?   Malignant neoplasm of upper-outer quadrant of left breast in female, estrogen receptor positive (HCC) Yes    SUMMARY OF ONCOLOGIC HISTORY: Oncology History  Malignant neoplasm of upper-outer quadrant of left breast in female, estrogen receptor positive (HCC)  09/18/2022 Initial Diagnosis   Screening mammogram detected left breast mass at 2 o'clock position measuring 0.5 cm, axilla negative, biopsy revealed grade 1 IDC with focal extracellular mucin plus grade 1 DCIS ER 100%, PR 99%, Ki-67 5%, HER2 1+, FISH negative   09/30/2022 Cancer Staging   Staging form: Breast, AJCC 8th Edition - Clinical: Stage IA (cT1b, cN0, cM0, G1, ER+, PR+, HER2-) - Signed by Serena Croissant, MD on 09/30/2022 Histologic grading system: 3 grade system   11/04/2022 Surgery   Left lumpectomy: Grade 1 IDC 0.8 cm, margins negative, ER 100%, PR 99%, HER2 negative 1+, Ki-67 5%   11/2022 -  Anti-estrogen oral therapy   1 mg Anastrozole x 5 years     CHIEF COMPLIANT: F/U of breast cancer  HPI: Patient with above-mentioned history of breast cancer who has been on anastrozole therapy is here for annual follow-up.  She reports no new problems or concerns.    ALLERGIES:  is allergic to codeine.  MEDICATIONS:  Current Outpatient Medications  Medication Sig Dispense Refill   acetaminophen (TYLENOL) 500 MG tablet Take 1,000 mg by mouth every 6 (six) hours as needed for moderate pain.     albuterol (VENTOLIN HFA) 108 (90 Base) MCG/ACT inhaler INHALE 2 PUFFS INTO THE LUNGS  EVERY 6 (SIX) HOURS AS NEEDED FOR WHEEZING. 1 each 1   amLODipine (NORVASC) 5 MG tablet Take 5 mg by mouth in the morning.     anastrozole (ARIMIDEX) 1 MG tablet Take 1 tablet (1 mg total) by mouth daily. 90 tablet 3   aspirin EC 81 MG tablet Take 81 mg by mouth at bedtime.     azelastine (ASTELIN) 0.1 % nasal spray Place 1 spray into both nostrils 2 (two) times daily as needed for rhinitis. Use in each nostril as directed     cyclobenzaprine (FLEXERIL) 10 MG tablet Take 1 tablet (10 mg total) by mouth 3 (three) times daily as needed for muscle spasms. 30 tablet 1   furosemide (LASIX) 40 MG tablet TAKE 2 TABLETS EVERY DAY (NEED MD APPOINTMENT FOR REFILLS) 180 tablet 3   Homeopathic Products (ZINC) LOZG Take 1 tablet by mouth daily as needed (immune support).     hydroxychloroquine (PLAQUENIL) 200 MG tablet Take 400 mg by mouth in the morning.     Lidocaine-Adhesive Sheets (LIDOPURE PATCH EX) Place 1 patch onto the skin daily as needed (pain).     metoprolol tartrate (LOPRESSOR) 100 MG tablet Take one tablet two hours prior to cardiac CTA. 1 tablet 0   nortriptyline (PAMELOR) 10 MG capsule Take 30 mg by mouth at bedtime.     ondansetron (ZOFRAN) 4 MG tablet Take 1 tablet (4 mg total) by mouth every 8 (eight) hours as needed for nausea or vomiting. 10 tablet 0   oxyCODONE-acetaminophen (PERCOCET) 5-325 MG tablet Take 1 tablet by mouth every  4 (four) hours as needed (max 6 q). 20 tablet 0   pantoprazole (PROTONIX) 40 MG tablet Take 40 mg by mouth 2 (two) times daily.     Povidone, PF, (IVIZIA DRY EYES) 0.5 % SOLN Place 1-2 drops into both eyes 3 (three) times daily as needed (dry/irritated eyes.).     predniSONE (DELTASONE) 5 MG tablet Take 5 mg by mouth in the morning.     rosuvastatin (CRESTOR) 10 MG tablet Take 1 tablet (10 mg total) by mouth daily. 90 tablet 3   zolpidem (AMBIEN) 10 MG tablet Take 10 mg by mouth at bedtime as needed for sleep.     No current facility-administered medications for  this visit.    PHYSICAL EXAMINATION: ECOG PERFORMANCE STATUS: 1 - Symptomatic but completely ambulatory  Vitals:   08/30/23 1354  BP: (!) 158/61  Pulse: 95  Resp: 18  Temp: (!) 97.2 F (36.2 C)  SpO2: 92%   Filed Weights   08/30/23 1354  Weight: 182 lb 1.6 oz (82.6 kg)    Physical Exam          (exam performed in the presence of a chaperone)  LABORATORY DATA:  I have reviewed the data as listed    Latest Ref Rng & Units 04/30/2023    2:01 PM 01/06/2023    1:43 PM 11/04/2022   12:14 PM  CMP  Glucose 70 - 99 mg/dL 161  096  045   BUN 8 - 23 mg/dL 12  13  12    Creatinine 0.44 - 1.00 mg/dL 4.09  8.11  9.14   Sodium 135 - 145 mmol/L 139  141  139   Potassium 3.5 - 5.1 mmol/L 3.4  4.1  3.5   Chloride 98 - 111 mmol/L 99  98  100   CO2 22 - 32 mmol/L 30  30  28    Calcium 8.9 - 10.3 mg/dL 9.2  9.0  8.8   Total Protein 6.5 - 8.1 g/dL  7.0  6.7   Total Bilirubin 0.3 - 1.2 mg/dL  1.0  1.5   Alkaline Phos 38 - 126 U/L  67  93   AST 15 - 41 U/L  33  39   ALT 0 - 44 U/L  24  25     Lab Results  Component Value Date   WBC 13.9 (H) 04/30/2023   HGB 15.3 (H) 04/30/2023   HCT 44.2 04/30/2023   MCV 98.2 04/30/2023   PLT 306 04/30/2023   NEUTROABS 7.6 01/12/2023    ASSESSMENT & PLAN:  Malignant neoplasm of upper-outer quadrant of left breast in female, estrogen receptor positive (HCC) 09/18/2022:Screening mammogram detected left breast mass at 2 o'clock position measuring 0.5 cm, axilla negative, biopsy revealed grade 1 IDC with focal extracellular mucin plus grade 1 DCIS ER 100%, PR 99%, Ki-67 5%, HER2 1+, FISH negative  11/04/22: Left lumpectomy: Grade 1 IDC 0.8 cm, margins negative, ER 100%, PR 99%, HER2 negative 1+, Ki-67 5%  Treatment plan: Radiation is not being planned because of history of scleroderma. Current treatment: Antiestrogen therapy with anastrozole 1 mg daily x 5 years started 12/01/2022 Anastrozole toxicities: Tolerating it well  Breast cancer  surveillance: Mammogram   08/31/2023: Benign Breast exam 08/30/2023: Benign  Return to clinic in 1 year for follow-up     No orders of the defined types were placed in this encounter.  The patient has a good understanding of the overall plan. she agrees with it. she will call with  any problems that may develop before the next visit here. Total time spent: 30 mins including face to face time and time spent for planning, charting and co-ordination of care   Tamsen Meek, MD 09/01/23

## 2023-09-14 DIAGNOSIS — R21 Rash and other nonspecific skin eruption: Secondary | ICD-10-CM | POA: Diagnosis not present

## 2023-09-14 DIAGNOSIS — I7300 Raynaud's syndrome without gangrene: Secondary | ICD-10-CM | POA: Diagnosis not present

## 2023-09-14 DIAGNOSIS — R140 Abdominal distension (gaseous): Secondary | ICD-10-CM | POA: Diagnosis not present

## 2023-09-14 DIAGNOSIS — M340 Progressive systemic sclerosis: Secondary | ICD-10-CM | POA: Diagnosis not present

## 2023-09-14 DIAGNOSIS — M0579 Rheumatoid arthritis with rheumatoid factor of multiple sites without organ or systems involvement: Secondary | ICD-10-CM | POA: Diagnosis not present

## 2023-09-14 DIAGNOSIS — M5126 Other intervertebral disc displacement, lumbar region: Secondary | ICD-10-CM | POA: Diagnosis not present

## 2023-09-14 DIAGNOSIS — J849 Interstitial pulmonary disease, unspecified: Secondary | ICD-10-CM | POA: Diagnosis not present

## 2023-09-14 DIAGNOSIS — M1991 Primary osteoarthritis, unspecified site: Secondary | ICD-10-CM | POA: Diagnosis not present

## 2023-09-14 DIAGNOSIS — L03011 Cellulitis of right finger: Secondary | ICD-10-CM | POA: Diagnosis not present

## 2023-09-29 DIAGNOSIS — L821 Other seborrheic keratosis: Secondary | ICD-10-CM | POA: Diagnosis not present

## 2023-09-29 DIAGNOSIS — L218 Other seborrheic dermatitis: Secondary | ICD-10-CM | POA: Diagnosis not present

## 2023-11-04 ENCOUNTER — Other Ambulatory Visit: Payer: Self-pay | Admitting: Hematology and Oncology

## 2023-11-09 DIAGNOSIS — Z1272 Encounter for screening for malignant neoplasm of vagina: Secondary | ICD-10-CM | POA: Diagnosis not present

## 2023-11-09 DIAGNOSIS — Z124 Encounter for screening for malignant neoplasm of cervix: Secondary | ICD-10-CM | POA: Diagnosis not present

## 2023-11-09 DIAGNOSIS — Z13820 Encounter for screening for osteoporosis: Secondary | ICD-10-CM | POA: Diagnosis not present

## 2023-11-09 DIAGNOSIS — N959 Unspecified menopausal and perimenopausal disorder: Secondary | ICD-10-CM | POA: Diagnosis not present

## 2023-11-09 DIAGNOSIS — Z7952 Long term (current) use of systemic steroids: Secondary | ICD-10-CM | POA: Diagnosis not present

## 2023-11-09 DIAGNOSIS — N958 Other specified menopausal and perimenopausal disorders: Secondary | ICD-10-CM | POA: Diagnosis not present

## 2023-11-09 DIAGNOSIS — Z6833 Body mass index (BMI) 33.0-33.9, adult: Secondary | ICD-10-CM | POA: Diagnosis not present

## 2023-12-10 ENCOUNTER — Ambulatory Visit: Payer: Medicare HMO | Admitting: Internal Medicine

## 2023-12-24 DIAGNOSIS — C50412 Malignant neoplasm of upper-outer quadrant of left female breast: Secondary | ICD-10-CM | POA: Diagnosis not present

## 2023-12-29 DIAGNOSIS — Z79899 Other long term (current) drug therapy: Secondary | ICD-10-CM | POA: Diagnosis not present

## 2023-12-29 DIAGNOSIS — H43811 Vitreous degeneration, right eye: Secondary | ICD-10-CM | POA: Diagnosis not present

## 2023-12-29 DIAGNOSIS — H26491 Other secondary cataract, right eye: Secondary | ICD-10-CM | POA: Diagnosis not present

## 2023-12-29 DIAGNOSIS — M069 Rheumatoid arthritis, unspecified: Secondary | ICD-10-CM | POA: Diagnosis not present

## 2024-01-05 DIAGNOSIS — Z961 Presence of intraocular lens: Secondary | ICD-10-CM | POA: Diagnosis not present

## 2024-01-05 DIAGNOSIS — H43811 Vitreous degeneration, right eye: Secondary | ICD-10-CM | POA: Diagnosis not present

## 2024-01-05 DIAGNOSIS — H26492 Other secondary cataract, left eye: Secondary | ICD-10-CM | POA: Diagnosis not present

## 2024-01-13 DIAGNOSIS — H524 Presbyopia: Secondary | ICD-10-CM | POA: Diagnosis not present

## 2024-01-25 ENCOUNTER — Other Ambulatory Visit: Payer: Self-pay | Admitting: Internal Medicine

## 2024-01-25 DIAGNOSIS — M359 Systemic involvement of connective tissue, unspecified: Secondary | ICD-10-CM

## 2024-01-25 DIAGNOSIS — J8410 Pulmonary fibrosis, unspecified: Secondary | ICD-10-CM

## 2024-01-26 ENCOUNTER — Ambulatory Visit: Payer: Medicare HMO

## 2024-01-26 DIAGNOSIS — J8410 Pulmonary fibrosis, unspecified: Secondary | ICD-10-CM

## 2024-01-26 DIAGNOSIS — M359 Systemic involvement of connective tissue, unspecified: Secondary | ICD-10-CM | POA: Diagnosis not present

## 2024-01-26 DIAGNOSIS — J8489 Other specified interstitial pulmonary diseases: Secondary | ICD-10-CM | POA: Diagnosis not present

## 2024-01-26 LAB — PULMONARY FUNCTION TEST
DL/VA % pred: 124 %
DL/VA: 5.13 ml/min/mmHg/L
DLCO cor % pred: 74 %
DLCO cor: 13.8 ml/min/mmHg
DLCO unc % pred: 74 %
DLCO unc: 13.8 ml/min/mmHg
FEF 25-75 Pre: 1.02 L/s
FEF2575-%Pred-Pre: 63 %
FEV1-%Pred-Pre: 61 %
FEV1-Pre: 1.24 L
FEV1FVC-%Pred-Pre: 101 %
FEV6-%Pred-Pre: 61 %
FEV6-Pre: 1.58 L
FEV6FVC-%Pred-Pre: 105 %
FVC-%Pred-Pre: 60 %
FVC-Pre: 1.63 L
Pre FEV1/FVC ratio: 76 %
Pre FEV6/FVC Ratio: 100 %

## 2024-01-26 NOTE — Progress Notes (Signed)
 Spirometry/DLCO performed today.

## 2024-01-26 NOTE — Patient Instructions (Signed)
 Spirometry/DLCO performed today.

## 2024-01-31 ENCOUNTER — Other Ambulatory Visit (HOSPITAL_BASED_OUTPATIENT_CLINIC_OR_DEPARTMENT_OTHER): Payer: Self-pay | Admitting: Family

## 2024-01-31 DIAGNOSIS — Z01818 Encounter for other preprocedural examination: Secondary | ICD-10-CM

## 2024-01-31 DIAGNOSIS — I2510 Atherosclerotic heart disease of native coronary artery without angina pectoris: Secondary | ICD-10-CM

## 2024-02-15 ENCOUNTER — Ambulatory Visit: Payer: Medicare HMO | Admitting: Internal Medicine

## 2024-02-28 ENCOUNTER — Other Ambulatory Visit (HOSPITAL_BASED_OUTPATIENT_CLINIC_OR_DEPARTMENT_OTHER): Payer: Self-pay | Admitting: Family

## 2024-02-28 DIAGNOSIS — I2510 Atherosclerotic heart disease of native coronary artery without angina pectoris: Secondary | ICD-10-CM

## 2024-02-28 DIAGNOSIS — Z01818 Encounter for other preprocedural examination: Secondary | ICD-10-CM

## 2024-03-06 ENCOUNTER — Ambulatory Visit: Payer: Medicare HMO | Admitting: Primary Care

## 2024-03-10 ENCOUNTER — Telehealth: Payer: Self-pay | Admitting: Primary Care

## 2024-03-10 ENCOUNTER — Telehealth: Admitting: Primary Care

## 2024-03-10 DIAGNOSIS — Z87891 Personal history of nicotine dependence: Secondary | ICD-10-CM

## 2024-03-10 DIAGNOSIS — J8489 Other specified interstitial pulmonary diseases: Secondary | ICD-10-CM | POA: Diagnosis not present

## 2024-03-10 DIAGNOSIS — J309 Allergic rhinitis, unspecified: Secondary | ICD-10-CM

## 2024-03-10 DIAGNOSIS — M359 Systemic involvement of connective tissue, unspecified: Secondary | ICD-10-CM | POA: Diagnosis not present

## 2024-03-10 MED ORDER — IPRATROPIUM BROMIDE 0.03 % NA SOLN: 2.0000 | Freq: Two times a day (BID) | NASAL | 1 refills | Status: DC

## 2024-03-10 NOTE — Progress Notes (Signed)
 Virtual Visit via Video Note  I connected with [name] on 03/10/24 at  2:30 PM EDT by a video enabled telemedicine application and verified that I am speaking with the correct person using two identifiers.  Location: Patient: Home Provider: Office   I discussed the limitations of evaluation and management by telemedicine and the availability of in person appointments. The patient expressed understanding and agreed to proceed.  History of Present Illness: 78 year of female, former smoker. PMH significant for CHF, HTN, pulm HTN, raynaud's disease, ILD, chronic rheumatic arthritis, osteopenia, breast cancer, seasonal allergies.   OV 12/31/2022  Subjective:  Patient ID: [name], female , DOB: 1946/04/23 , age 78 y.o. , MRN: [id number] , ADDRESS: [address] [place] [address] PCP Artemisa Bile, MD Patient Care Team: Artemisa Bile, MD as PCP - General (Internal Medicine) Audery Blazing Deannie Fabian, MD as PCP - Cardiology (Cardiology) Vernell Goldsmith, MD as Attending Physician (Pulmonary Disease) Alane Hsu, RN as Oncology Nurse Navigator Auther Bo, RN as Oncology Nurse Navigator Sim Dryer, MD as Consulting Physician (General Surgery) Cameron Cea, MD as Consulting Physician (Hematology and Oncology) Johna Myers, MD as Consulting Physician (Radiation Oncology)  This Provider for this visit: Treatment Team:  Attending Provider: Maire Scot, MD   ILD secondary to connective tissue disease  [rheumatologist previously - Colan Dash, M.D., M.H.S. at [facility] but now Dr [employer] at  [facility])  - per records - ILD since 2009  - systemic sclerosis/RA overlap per [facility] notes  = Telangiectasias, Raynaud's, esophageal dysmotility - ? calcinosis on right heel. Per [facility] notes  - Serology   - 2012 at [facility]: (rheumatoid factor 90, ANA 1: 640, ANA second titer 1: 2000 560, positive SCL 70 August 2012).   -  2021 at [facility]: RF + (low titer), ANA + (low titer), SCl 70 +  (in 2021  Jan at [facility])  -High-resolution CT chest January 2021 with UIP features [stable since 2013] -> stable oct 2023  - ILD: Dr Brent Cambric -> Dr Elverna Hamman -> DR Bertrum Brodie at ILD center [place]     Rx History - all [facility] Arava initiated 12/2011 d/c due to lack of control of arthrits . MTX initiated 04/2012 -discontinued due to concerns since patient has underlying lung disease Plaquenil and prednisone as of July 2023 and feb 2024   D- CHF -   RHC  -  - She had a right heart cath in April 2015 which showed the following: Hemodynamic Findings:   PA: 43/18 (mean 31)   PCWP: 25   Fick Cardiac Index: 3.45 L/min/m2    -Grade 1 diastolic dysfunction on echocardiogram January 2021  12/31/2022 -   Chief Complaint  Patient presents with   Follow-up    No c/o   [name] 78 y.o. -returns for follow-up.  Since her last visit she fell and tore the tendons in her right shoulder.  She has upcoming surgery in 2 weeks.  She has problems with abduction.  In addition she got diagnosed with breast cancer.  Was early stage.  She is on Arimidex.  She has seen Dr. Gudena per review of the external medical records.  She had a left lumpectomy 11/04/2022 grade 1.  Tolerated the surgery quite well.  She is being monitored.  She feels stable from a respiratory standpoint.  Symptoms are stable below.  She continues to see rheumatology Dr. Ebbie Goldmann.  She is on Plaquenil and low-dose prednisone.  In the fall  2023 her CT scan of the chest LAD in a few years that she might be progressing with her ILD.  Current symptom score is as below and overall is range bound.  Patient Instructions  Interstitial lung disease due to connective tissue disease (HCC) Scleroderma (HCC)   - pulmonary fibrosis stable clinically . - Echo fall 2023 is normal - PFT aug 2023 declining but symptoms Jan 2024 and HRCT oct 2023 are strable   Plan - -do spirometry and dlco in 4-6 monhts - continue primary treatment of scleroderma through  rheum   Followup 30 min visit in 4-6 months after PFT  =- symptoms score and walk test at followu     03/10/2024- Interim hx  Discussed the use of AI scribe software for clinical note transcription with the patient, who gave verbal consent to proceed.  History of Present Illness   [name] is a 78 year old female with interstitial lung disease related to scleroderma who presents for follow-up of her pulmonary condition.  She has interstitial lung disease related to scleroderma, which appears to be well controlled. She experiences increased difficulty during pollen season, a recurring issue each year. A CT scan in October 2023 showed stable fibrosis, and a pulmonary function test in February 2025 indicated moderate obstructive airway disease with a lung function of 61% predicted, an improvement from 56% predicted in August 2023. She does not use supplemental oxygen and maintains an oxygen saturation between 93-97%.  She experiences shortness of breath primarily with exertion, such as walking up stairs, which she rates as a four on a scale of zero to five. At rest, she has no shortness of breath, and during simple tasks like showering or changing clothes, she rates it as a one. She manages her symptoms by pausing activities when she feels short of breath.  She has a cough that worsens during pollen season, rating it as a two. She also experiences fatigue rated as a two.  Her current medications include Plaquenil 400 mg in the morning, Norvasc, aspirin 81 mg, Flexeril as needed, Lasix 80 mg daily, nortriptyline 30 mg at bedtime, Protonix, prednisone 5 mg in the morning, Crestor 10 mg, and Ambien 10 mg at bedtime as needed. She also uses ipratropium nasal spray for allergies, which she finds effective.      PFTs in February 2025>> moderate obstructive airway disease/ FEV 1.24 (61%), ratio 76. DLCO un 13.80 (74%).    SYMPTOM SCALE - ILD 12/26/2019   08/08/2020 198# 06/19/2022 197#  08/20/2022   09/28/2022  12/31/2022  03/10/2024   O2 use ra ra ra   RA ra RA  Shortness of Breath 0 -> 5 scale with 5 being worst (score 6 If unable to do)           At rest 0 0 0 2 0 0 0  Simple tasks - showers, clothes change, eating, shaving 3 2 1 3 2 1 1   Household (dishes, doing bed, laundry) 3 3 1 2 2 2  0  Shopping  2 2 1 3 2 3 1   Walking level at own pace 3 2 1.5 2 0 2 1  Walking up Stairs 2 3 4 4 4 4 4   Total (30-36) Dyspnea Score 14 12 8.5 16 10 12 7   How bad is your cough? 3 3 At times   2 0 1  How bad is your fatigue 3 3 1 2 2 2 2   How bad is nausea 3 2 0  0 0 0 0  How bad is vomiting?   0 0 0 0 0 0 0  How bad is diarrhea? 3 0 1 0 0 0 1- IBS  How bad is anxiety? 3 3 2 2  4- recent breast cancer dx 2 3  How bad is depression 3 3 2 1 3 2 3       Observations/Objective:  Appears well without overt respiratory symptoms  Assessment and Plan:  1. Interstitial lung disease due to connective tissue disease (HCC) (Primary)  Assessment and Plan    Interstitial Lung Disease due to Scleroderma Stable fibrosis on recent CT. Moderate obstructive airway disease with stable lung function at 61% predicted. Minimal dyspnea at rest and light activities. No oxygen therapy required. - Schedule in-office visit within 3-6 months for physical examination in-person and walk test. - Refill ipratropium nasal spray.  Allergic Rhinitis Increased cough and dyspnea during pollen season.  - Refill ipratropium nasal spray.  Congestive Heart Failure (CHF) No acute exacerbations reported.  Irritable Bowel Syndrome (IBS) Periodic diarrhea attributed to IBS   Follow Up Instructions:  Follow-up Has not been seen in the office for a year. Follow-up visit necessary to assess lung function and overall health. - Schedule in-office visit within 3-6 months for physical examination and walk test. - Have office staff call next week to schedule the appointment.       I discussed the assessment and  treatment plan with the patient. The patient was provided an opportunity to ask questions and all were answered. The patient agreed with the plan and demonstrated an understanding of the instructions.   The patient was advised to call back or seek an in-person evaluation if the symptoms worsen or if the condition fails to improve as anticipated.  I provided 22 minutes of non-face-to-face time during this encounter.   Antonio Baumgarten, NP

## 2024-03-10 NOTE — Telephone Encounter (Signed)
 Needs OV in person with me in 3-6 months/ ILD

## 2024-03-14 NOTE — Telephone Encounter (Signed)
 Tried calling patient again, went to voicemail.

## 2024-03-15 ENCOUNTER — Other Ambulatory Visit: Payer: Self-pay | Admitting: Cardiology

## 2024-03-15 DIAGNOSIS — Z01818 Encounter for other preprocedural examination: Secondary | ICD-10-CM

## 2024-03-15 DIAGNOSIS — I2510 Atherosclerotic heart disease of native coronary artery without angina pectoris: Secondary | ICD-10-CM

## 2024-03-15 NOTE — Telephone Encounter (Signed)
[facility] Message

## 2024-03-21 NOTE — Telephone Encounter (Signed)
 Called patient and scheduled her with Sueanne Emerald.

## 2024-04-12 ENCOUNTER — Other Ambulatory Visit: Payer: Self-pay

## 2024-04-12 DIAGNOSIS — R002 Palpitations: Secondary | ICD-10-CM

## 2024-04-12 MED ORDER — FUROSEMIDE 40 MG PO TABS
ORAL_TABLET | ORAL | 0 refills | Status: DC
Start: 2024-04-12 — End: 2024-07-24

## 2024-04-13 ENCOUNTER — Telehealth: Payer: Self-pay | Admitting: Internal Medicine

## 2024-04-13 DIAGNOSIS — M359 Systemic involvement of connective tissue, unspecified: Secondary | ICD-10-CM

## 2024-04-13 NOTE — Telephone Encounter (Signed)
 Personally not seen [name] since erly 2024. Saw [name] earlier this year. PFT in feb 2025 somewhat concerning for decline. Last CT oct 2023  Plan  - change June 20-25 OV from [place] to[place] first avail in June/July 2025  - if she is willing get HRCT  at least before visit -   - lmk and then I can order        Latest Ref Rng & Units 01/26/2024    1:26 PM 07/27/2022    4:11 PM 07/23/2020   11:58 AM 02/25/2018    2:59 PM 09/22/2016    3:33 PM 02/07/2015   10:34 AM 03/07/2014   12:09 PM  PFT Results  FVC-Pre L 1.63  1.51  1.70  1.75  1.94  1.97  2.04   FVC-Predicted Pre % 60  55  60  60  68  68  70   FVC-Post L    1.78  1.92  1.94    FVC-Predicted Post %    61  68  67    Pre FEV1/FVC % % 76  76  73  77  74  76  75   Post FEV1/FCV % %    78  79  80    FEV1-Pre L 1.24  1.15  1.25  1.35  1.44  1.50  1.54   FEV1-Predicted Pre % 61  56  59  62  67  68  69   FEV1-Post L    1.38  1.51  1.56    DLCO uncorrected ml/min/mmHg 13.80  14.37  16.83  16.44  15.97  15.60  14.71   DLCO UNC% % 74  77  90  71  74  72  68   DLCO corrected ml/min/mmHg 13.80  14.37  16.83   15.87     DLCO COR %Predicted % 74  77  90   73     DLVA Predicted % 124  122  142  122  114  115  111   TLC L     3.40  2.92    TLC % Predicted %     71  61    RV % Predicted %     72  50

## 2024-04-14 ENCOUNTER — Telehealth: Payer: Self-pay | Admitting: *Deleted

## 2024-04-14 MED ORDER — ANASTROZOLE 1 MG PO TABS: 1.0000 mg | ORAL_TABLET | Freq: Every day | ORAL | 3 refills | Status: AC

## 2024-04-14 NOTE — Telephone Encounter (Signed)
 Prescription refill request sent to pharmacy on file for Anastrozole .

## 2024-04-14 NOTE — Telephone Encounter (Signed)
 There will need to be a Chest CT order placed

## 2024-04-18 NOTE — Telephone Encounter (Signed)
 CT chest to be done next few eeks ordered

## 2024-05-03 ENCOUNTER — Ambulatory Visit (HOSPITAL_BASED_OUTPATIENT_CLINIC_OR_DEPARTMENT_OTHER)
Admission: RE | Admit: 2024-05-03 | Discharge: 2024-05-03 | Disposition: A | Discharge status: Home / Self Care | Source: Ambulatory Visit | Attending: Internal Medicine | Admitting: Internal Medicine

## 2024-05-03 DIAGNOSIS — M359 Systemic involvement of connective tissue, unspecified: Secondary | ICD-10-CM | POA: Diagnosis present

## 2024-05-03 DIAGNOSIS — J8489 Other specified interstitial pulmonary diseases: Secondary | ICD-10-CM | POA: Diagnosis present

## 2024-05-04 ENCOUNTER — Telehealth: Payer: Self-pay | Admitting: Cardiology

## 2024-05-04 DIAGNOSIS — I2510 Atherosclerotic heart disease of native coronary artery without angina pectoris: Secondary | ICD-10-CM

## 2024-05-04 DIAGNOSIS — Z01818 Encounter for other preprocedural examination: Secondary | ICD-10-CM

## 2024-05-04 MED ORDER — ROSUVASTATIN CALCIUM 10 MG PO TABS
10.0000 mg | ORAL_TABLET | Freq: Every day | ORAL | 0 refills | Status: DC
Start: 2024-05-04 — End: 2024-07-10

## 2024-05-04 NOTE — Telephone Encounter (Signed)
*  STAT* If patient is at the pharmacy, call can be transferred to refill team.   1. Which medications need to be refilled? (please list name of each medication and dose if known)   rosuvastatin  (CRESTOR ) 10 MG tablet     2. Would you like to learn more about the convenience, safety, & potential cost savings by using the [facility]? No   3. Are you open to using the [facility] (Type [facility]. ) No   4. Which pharmacy/location (including street and city if local pharmacy) is medication to be sent to?  [facility] - [place], [place] - [address] Phone: [phone]  Fax: [phone]      5. Do they need a 30 day or 90 day supply? 90 day  Pt has scheduled appt on 6/18

## 2024-05-04 NOTE — Telephone Encounter (Signed)
 Pt's medication was sent to pt's pharmacy as requested. Confirmation received.

## 2024-05-16 ENCOUNTER — Ambulatory Visit: Admitting: Primary Care

## 2024-05-16 NOTE — Progress Notes (Signed)
 Cardiology Office Note    Date:  05/17/2024  ID:  [name], [name] 1946-02-21, MRN [id number] PCP:  Artemisa Bile, MD  Cardiologist:  Alexandria Angel, MD  Electrophysiologist:  None   Chief Complaint: Follow up for CAD, RBBB   History of Present Illness: .    [name] is a 78 y.o. female with visit-pertinent history of diastolic heart failure, hypertension, CREST/scleroderma and ILD, breast cancer, RBBB.   PFTs 4/15 with FEV1 1.54 and DLCO 68% predicted. LHC (03/15/14): RA 7, RV 43/7/9, PA 43/18 (mean 31), PCWP 25, CO 6.9, CI 3.45, no CAD. Placed on lasix . ABIs May 2016 normal. Most recent echocardiogram January 2021 showed normal LV function, moderate left ventricular hypertrophy, grade 1 diastolic dysfunction, mild left atrial enlargement.   Chest CT January 2022 showed pulmonary fibrosis, bronchiectasis consistent with UIP.  MPI in 01/2021 was a normal and low restudy.  Echocardiogram in 06/2022 indicated LVEF of 60 to 65%, no RWMA, G1 DD, RV systolic function and size was normal, normal PASP, no evidence of mitral stenosis, no aortic stenosis present.  Patient was last seen in clinic on 01/25/2023 by Neomi Banks, NP for preoperative cardiac evaluation.  Patient was noted to have new right bundle branch block and was pending surgery.  It was noted that in November 2023 she had a fall leading to a shoulder surgery and also finding of stage I breast cancer requiring left breast lumpectomy on 11/04/2022.  She was being pulled back for shoulder surgery when case was canceled due to a new right bundle branch block.  She reported that her exertional dyspnea was overall stable which she attributed to her scleroderma and ILD however she did note in recent months she had becoming fatigued easily.  She denied any chest pain, pressure, tightness.  It was recommended that patient undergo cardiac CTA.  Coronary CTA on 02/05/2023 indicated minimal nonobstructive CAD with a coronary calcium  score of 419,  this was 85th percentile for age and sex matched control, aortic atherosclerosis was present.  Was recommended that she continue aspirin  81 mg daily and start rosuvastatin  10 mg daily.  Today she presents for follow-up.  She reports that she is doing ok. Notes increased shortness of breath with exertion, notes this may be related to her history of ILD. Does not feel there has been a significant recent change in her breathing, more a gradual decline. She denies chest pain on exertion, notes she will have occasional discomfort between her shoulder blades and slight discomfort under her left breast when sitting in her chair, improves with stretching or moving.  Denies significant increased lower extremity edema, orthopnea or PND.  ROS: .   Today she denies fatigue, palpitations, melena, hematuria, hemoptysis, diaphoresis, weakness, presyncope, syncope, orthopnea, and PND.  All other systems are reviewed and otherwise negative. Studies Reviewed: Aaron Aas    EKG:  EKG is ordered today, personally reviewed, demonstrating  EKG Interpretation Date/Time:  Wednesday May 17 2024 14:10:00 EDT Ventricular Rate:  85 PR Interval:  176 QRS Duration:  148 QT Interval:  402 QTC Calculation: 478 R Axis:   99  Text Interpretation: Normal sinus rhythm Right bundle branch block When compared with ECG of 21-Jan-2023 09:45, No significant change was found Confirmed by Brentin Shin [phone]) on 05/17/2024 5:13:50 PM   CV Studies: Cardiac studies reviewed are outlined and summarized above. Otherwise please see EMR for full report. Cardiac Studies & Procedures   ______________________________________________________________________________________________   STRESS TESTS  MYOCARDIAL PERFUSION  IMAGING 02/12/2021  Narrative  Nuclear stress EF: 82%.  There was no ST segment deviation noted during stress.  No T wave inversion was noted during stress.  Normal perfusion with no evidence of ischemia or infarction.  The  study is normal.  This is a low risk study.  The left ventricular ejection fraction is hyperdynamic (>65%).  Riccardo Chamberlain, MD   ECHOCARDIOGRAM  ECHOCARDIOGRAM COMPLETE 07/14/2022  Narrative ECHOCARDIOGRAM REPORT    Patient Name:   [name] Date of Exam: 07/14/2022 Medical Rec #:  [id number]         Height:       63.0 in Accession #:    [id number]        Weight:       197.8 lb Date of Birth:  1946/10/23         BSA:          1.925 m Patient Age:    76 years          BP:           110/60 mmHg Patient Gender: F                 HR:           88 bpm. Exam Location:  [address]  Procedure: 2D Echo, Cardiac Doppler, Color Doppler and 3D Echo  Indications:    Dyspnea R06.00; Interstitial lung disease due to connective tissue disease.  History:        Patient has prior history of Echocardiogram examinations, most recent 12/19/2019.  Sonographer:    Joleen Navy RDCS Referring Phys: [id number] MURALI RAMASWAMY  IMPRESSIONS   1. Left ventricular ejection fraction, by estimation, is 60 to 65%. The left ventricle has normal function. The left ventricle has no regional wall motion abnormalities. Left ventricular diastolic parameters are consistent with Grade I diastolic dysfunction (impaired relaxation). 2. Right ventricular systolic function is normal. The right ventricular size is normal. There is normal pulmonary artery systolic pressure. 3. Left atrial size was mildly dilated. 4. The mitral valve is abnormal. No evidence of mitral valve regurgitation. No evidence of mitral stenosis. 5. The aortic valve is tricuspid. Aortic valve regurgitation is not visualized. No aortic stenosis is present. 6. The inferior vena cava is normal in size with greater than 50% respiratory variability, suggesting right atrial pressure of 3 mmHg.  FINDINGS Left Ventricle: Left ventricular ejection fraction, by estimation, is 60 to 65%. The left ventricle has normal function. The left  ventricle has no regional wall motion abnormalities. The left ventricular internal cavity size was normal in size. There is no left ventricular hypertrophy. Left ventricular diastolic parameters are consistent with Grade I diastolic dysfunction (impaired relaxation).  Right Ventricle: The right ventricular size is normal. No increase in right ventricular wall thickness. Right ventricular systolic function is normal. There is normal pulmonary artery systolic pressure. The tricuspid regurgitant velocity is 2.81 m/s, and with an assumed right atrial pressure of 3 mmHg, the estimated right ventricular systolic pressure is 34.6 mmHg.  Left Atrium: Left atrial size was mildly dilated.  Right Atrium: Right atrial size was normal in size.  Pericardium: There is no evidence of pericardial effusion.  Mitral Valve: The mitral valve is abnormal. Mild mitral annular calcification. No evidence of mitral valve regurgitation. No evidence of mitral valve stenosis.  Tricuspid Valve: The tricuspid valve is normal in structure. Tricuspid valve regurgitation is mild . No evidence of tricuspid stenosis.  Aortic Valve: The aortic  valve is tricuspid. Aortic valve regurgitation is not visualized. No aortic stenosis is present.  Pulmonic Valve: The pulmonic valve was normal in structure. Pulmonic valve regurgitation is not visualized. No evidence of pulmonic stenosis.  Aorta: The aortic root is normal in size and structure.  Venous: The inferior vena cava is normal in size with greater than 50% respiratory variability, suggesting right atrial pressure of 3 mmHg.  IAS/Shunts: No atrial level shunt detected by color flow Doppler.   LEFT VENTRICLE PLAX 2D LVIDd:         4.50 cm   Diastology LVIDs:         2.80 cm   LV e' medial:    5.66 cm/s LV PW:         1.30 cm   LV E/e' medial:  13.9 LV IVS:        1.20 cm   LV e' lateral:   7.40 cm/s LVOT diam:     2.00 cm   LV E/e' lateral: 10.6 LV SV:         66 LV SV  Index:   34 LVOT Area:     3.14 cm  3D Volume EF: 3D EF:        62 % LV EDV:       91 ml LV ESV:       34 ml LV SV:        57 ml  RIGHT VENTRICLE RV Basal diam:  3.10 cm RV Mid diam:    2.90 cm RV S prime:     10.20 cm/s TAPSE (M-mode): 1.6 cm  LEFT ATRIUM             Index        RIGHT ATRIUM           Index LA diam:        4.30 cm 2.23 cm/m   RA Area:     11.70 cm LA Vol (A2C):   37.2 ml 19.33 ml/m  RA Volume:   20.90 ml  10.86 ml/m LA Vol (A4C):   36.3 ml 18.86 ml/m LA Biplane Vol: 38.7 ml 20.11 ml/m AORTIC VALVE LVOT Vmax:   111.00 cm/s LVOT Vmean:  74.500 cm/s LVOT VTI:    0.209 m  AORTA Ao Root diam: 3.00 cm Ao Asc diam:  3.20 cm  MITRAL VALVE                TRICUSPID VALVE MV Area (PHT): 3.93 cm     TR Peak grad:   31.6 mmHg MV Decel Time: 193 msec     TR Vmax:        281.00 cm/s MV E velocity: 78.40 cm/s MV A velocity: 107.00 cm/s  SHUNTS MV E/A ratio:  0.73         Systemic VTI:  0.21 m Systemic Diam: 2.00 cm  Janelle Mediate MD Electronically signed by Janelle Mediate MD Signature Date/Time: 07/14/2022/1:58:30 PM    Final      CT SCANS  CT CORONARY MORPH W/CTA COR W/SCORE 02/05/2023  Addendum 02/11/2023  9:55 AM ADDENDUM REPORT: 02/11/2023 09:52  EXAM: OVER-READ INTERPRETATION  CT CHEST  The following report is an over-read performed by radiologist Dr. Leverne Reading of [facility] on 02/11/2023. This over-read does not include interpretation of cardiac or coronary anatomy or pathology. The coronary CTA interpretation by the cardiologist is attached.  COMPARISON:  08/31/2022  FINDINGS: Heart size is upper limits of normal. No pericardial effusion.  The imaged portion of the thoracic aorta is nonaneurysmal. Central pulmonary vasculature is nondilated. No lymphadenopathy within the imaged chest.  Chronic interstitial lung disease, most severely affecting the right middle lobe and lingula, without evidence of significant  progression when compared to the previous CT. No pleural effusion or pneumothorax.  Hepatic steatosis. No acute bony abnormality. Postsurgical changes within the left breast with 3.1 cm fluid collection, which may represent a postoperative seroma or hematoma. Correlate with patient history.  IMPRESSION: 1. Chronic interstitial lung disease, most severely affecting the right middle lobe and lingula, without evidence of significant progression when compared to the previous CT. 2. Postsurgical changes within the left breast with 3.1 cm fluid collection, which may represent a postoperative seroma or hematoma. Correlate with patient history. 3. Hepatic steatosis.   Electronically Signed By: Leverne Reading D.O. On: 02/11/2023 09:52  Narrative HISTORY: Chest pain, nonspecific  EXAM: Cardiac/Coronary CT  TECHNIQUE: The patient was scanned on a [employer].  PROTOCOL: A 120 kV prospective scan was triggered in the descending thoracic aorta Vickie 111 HU's. Axial non-contrast 3 mm slices were carried out through the heart. The data set was analyzed on a dedicated work station and scored using the Agatston method. Gantry rotation speed was 250 msecs and collimation was 0.6 mm. Heart rate was optimized medically and sl NTG was given. The 3D data set was reconstructed in 5% intervals of the 35-75 % of the R-R cycle. Systolic and diastolic phases were analyzed on a dedicated work station using MPR, MIP and VRT modes. The patient received 100mL OMNIPAQUE  IOHEXOL  350 MG/ML SOLN of contrast.  FINDINGS: Coronary calcium  score: The patient's coronary artery calcium  score is 419, which places the patient in the 85th percentile.  Coronary arteries: Normal coronary origins.  Right dominance.  Right Coronary Artery: Normal caliber vessel, gives rise to PDA. Scattered calcified plaque in portions of proximal, mid and distal vessel. Maximum stenosis 1-24%.  Left Main Coronary  Artery: Normal caliber vessel. No significant plaque or stenosis.  Left Anterior Descending Coronary Artery: Normal caliber vessel. Proximal vessel with focal calcified plaque with 1-24% stenosis. Gives rise to two small diagonal branches.  Left Circumflex Artery: Normal caliber vessel. Proximal vessel with focal calcified plaque with 1-24% stenosis. Gives rise to four small OM branches.  Aorta: Normal size, 32 mm Vickie the mid ascending aorta (level of the PA bifurcation) measured double oblique. Aortic atherosclerosis. No dissection seen in visualized portions of the aorta.  Aortic Valve: No calcifications. Trileaflet.  Other findings:  Normal pulmonary vein drainage into the left atrium.  Normal left atrial appendage without a thrombus.  Normal size of the pulmonary artery.  Normal appearance of the pericardium.  Elevated right hemidiaphragm  IMPRESSION: 1.  Minimal nonobstructive CAD, CADRADS = 1.  2. Coronary calcium  score of 419. This was 85th percentile for age and sex matched control.  3. Normal coronary origin with right dominance.  4.  Aortic atherosclerosis.  5.  Elevated right hemidiaphragm  INTERPRETATION:  CAD-RADS 1: Minimal non-obstructive CAD (1-24%). Consider non-atherosclerotic causes of chest pain. Consider preventive therapy and risk factor modification.  Electronically Signed: By: Sheryle Donning M.D. On: 02/08/2023 17:26     ______________________________________________________________________________________________       Current Reported Medications:.    Current Meds  Medication Sig   acetaminophen  (TYLENOL ) 500 MG tablet Take 1,000 mg by mouth every 6 (six) hours as needed for moderate pain.   albuterol  (VENTOLIN  HFA) 108 (90 Base) MCG/ACT inhaler  INHALE 2 PUFFS INTO THE LUNGS EVERY 6 (SIX) HOURS AS NEEDED FOR WHEEZING.   amLODipine  (NORVASC ) 5 MG tablet Take 5 mg by mouth in the morning.   anastrozole  (ARIMIDEX ) 1 MG  tablet Take 1 tablet (1 mg total) by mouth daily.   aspirin  EC 81 MG tablet Take 81 mg by mouth Vickie bedtime.   azelastine  (ASTELIN ) 0.1 % nasal spray Place 1 spray into both nostrils 2 (two) times daily as needed for rhinitis. Use in each nostril as directed   cyclobenzaprine  (FLEXERIL ) 10 MG tablet Take 1 tablet (10 mg total) by mouth 3 (three) times daily as needed for muscle spasms.   furosemide  (LASIX ) 40 MG tablet TAKE 2 TABLETS EVERY DAY   Homeopathic Products (ZINC) LOZG Take 1 tablet by mouth daily as needed (immune support).   hydroxychloroquine (PLAQUENIL) 200 MG tablet Take 400 mg by mouth in the morning.   ipratropium (ATROVENT ) 0.03 % nasal spray Place 2 sprays into both nostrils every 12 (twelve) hours.   metoprolol  tartrate (LOPRESSOR ) 100 MG tablet Take one tablet two hours prior to cardiac CTA.   nortriptyline (PAMELOR) 10 MG capsule Take 30 mg by mouth Vickie bedtime.   pantoprazole  (PROTONIX ) 40 MG tablet Take 40 mg by mouth 2 (two) times daily.   Povidone, PF, (IVIZIA DRY EYES) 0.5 % SOLN Place 1-2 drops into both eyes 3 (three) times daily as needed (dry/irritated eyes.).   predniSONE  (DELTASONE ) 5 MG tablet Take 5 mg by mouth in the morning.   rosuvastatin  (CRESTOR ) 10 MG tablet Take 1 tablet (10 mg total) by mouth daily.   zolpidem  (AMBIEN ) 10 MG tablet Take 10 mg by mouth Vickie bedtime as needed for sleep.    Physical Exam:    VS:  BP (!) 116/54   Pulse 87   Ht 5' 3 (1.6 m)   Wt 187 lb (84.8 kg)   SpO2 95%   BMI 33.13 kg/m    Wt Readings from Last 3 Encounters:  05/17/24 187 lb (84.8 kg)  08/30/23 182 lb 1.6 oz (82.6 kg)  05/06/23 185 lb (83.9 kg)    GEN: Well nourished, well developed in no acute distress NECK: No JVD; No carotid bruits CARDIAC: RRR, 1/6 systolic murmur, no rubs or gallops RESPIRATORY:  Clear to auscultation without rales, wheezing or rhonchi  ABDOMEN: Soft, non-tender, non-distended EXTREMITIES:  No edema; No acute deformity     Asessement and  Plan:.    CAD/RBBB: Coronary CTA on 02/05/2023 indicated minimal nonobstructive CAD with a coronary calcium  score of 419, this was 85th percentile for age and sex matched control, aortic atherosclerosis was present.  Was recommended that she continue aspirin  81 mg daily and start rosuvastatin  10 mg daily.  Today she reports she is doing okay, notes increased dyspnea on exertion that is possibly related to her history of ILD.  Notes discomfort under her left breast and between her shoulder blades when sitting in her chair, improves with stretching or moving, discussed this overall sounds musculoskeletal in nature, patient reassured.  EKG indicates stable right bundle branch block.  She denies any chest pain on exertion.  Reviewed ED precautions.  Continue amlodipine  5 mg daily, aspirin  81 mg daily, Lasix  80 mg daily, Crestor  10 mg daily.  Chronic diastolic HF/aortic valve sclerosis: Echocardiogram in 06/2022 indicated LVEF of 60 to 65%, no RWMA, G1 DD, RV systolic function and size was normal, normal PASP, no evidence of mitral stenosis, no aortic stenosis present.  Patient reports increased  dyspnea on exertion, questions if related to ILD.  Patient's high-resolution chest CT indicated evidence of aortic valve calcification.  She appears euvolemic and well compensated on exam.  Will check echocardiogram.  HTN: Blood pressure today 116/54.  Continue amlodipine  5 mg daily, Lasix  80 mg daily.  Check CMET.  Hyperlipidemia: Patient without recent fasting lipid profile.  Check fasting lipid profile and CMET.   Thyroid nodules: Noted on high-resolution CT earlier this month with recommendation for thyroid ultrasound.  Patient notes that she plans to follow-up with her PCP regarding ongoing monitoring management.   Disposition: F/u with Chrishelle Zito, NP in 8-10 weeks.   Signed, Maisen Schmit D Ellyana Crigler, NP

## 2024-05-17 ENCOUNTER — Encounter: Payer: Self-pay | Admitting: Cardiology

## 2024-05-17 ENCOUNTER — Ambulatory Visit: Attending: Cardiology | Admitting: Cardiology

## 2024-05-17 VITALS — BP 116/54 | HR 87 | Ht 63.0 in | Wt 187.0 lb

## 2024-05-17 DIAGNOSIS — I10 Essential (primary) hypertension: Secondary | ICD-10-CM

## 2024-05-17 DIAGNOSIS — I4510 Unspecified right bundle-branch block: Secondary | ICD-10-CM | POA: Diagnosis not present

## 2024-05-17 DIAGNOSIS — R0609 Other forms of dyspnea: Secondary | ICD-10-CM | POA: Diagnosis not present

## 2024-05-17 DIAGNOSIS — I2510 Atherosclerotic heart disease of native coronary artery without angina pectoris: Secondary | ICD-10-CM

## 2024-05-17 DIAGNOSIS — E782 Mixed hyperlipidemia: Secondary | ICD-10-CM

## 2024-05-17 DIAGNOSIS — I358 Other nonrheumatic aortic valve disorders: Secondary | ICD-10-CM

## 2024-05-17 DIAGNOSIS — I5032 Chronic diastolic (congestive) heart failure: Secondary | ICD-10-CM | POA: Diagnosis not present

## 2024-05-17 DIAGNOSIS — E041 Nontoxic single thyroid nodule: Secondary | ICD-10-CM

## 2024-05-17 NOTE — Patient Instructions (Signed)
 Medication Instructions:  Your physician recommends that you continue on your current medications as directed. Please refer to the Current Medication list given to you today.    *If you need a refill on your cardiac medications before your next appointment, please call your pharmacy*   Lab Work:Your physician recommends that you return for lab work IN [place] - CMET  - LIPID PANEL     If you have labs (blood work) drawn today and your tests are completely normal, you will receive your results only by: MyChart Message (if you have MyChart) OR A paper copy in the mail If you have any lab test that is abnormal or we need to change your treatment, we will call you to review the results.   Testing/Procedures:  Echo will be scheduled at [address]  Your physician has requested that you have an echocardiogram. Echocardiography is a painless test that uses sound waves to create images of your heart. It provides your doctor with information about the size and shape of your heart and how well your heart's chambers and valves are working. This procedure takes approximately one hour. There are no restrictions for this procedure. Please do NOT wear cologne, perfume, aftershave, or lotions (deodorant is allowed). Please arrive 15 minutes prior to your appointment time.   Follow-Up: At [facility], you and your health needs are our priority.  As part of our continuing mission to provide you with exceptional heart care, we have created designated Provider Care Teams.  These Care Teams include your primary Cardiologist (physician) and Advanced Practice Providers (APPs -  Physician Assistants and Nurse Practitioners) who all work together to provide you with the care you need, when you need it.  We recommend signing up for the patient portal called MyChart.  Sign up information is provided on this After Visit Summary.  MyChart is used to connect with patients for Virtual Visits  (Telemedicine).  Patients are able to view lab/test results, encounter notes, upcoming appointments, etc.  Non-urgent messages can be sent to your provider as well.   To learn more about what you can do with MyChart, go to [email or web address].    Your next appointment:   8-10 week(s)  The format for your next appointment:   In Person  Provider:   Katlyn West, NP        Other Instructions

## 2024-05-18 LAB — COMPREHENSIVE METABOLIC PANEL WITH GFR
ALT: 22 IU/L (ref 0–32)
AST: 28 IU/L (ref 0–40)
Albumin: 4.2 g/dL (ref 3.8–4.8)
Alkaline Phosphatase: 88 IU/L (ref 44–121)
BUN/Creatinine Ratio: 16 (ref 12–28)
BUN: 13 mg/dL (ref 8–27)
Bilirubin Total: 0.9 mg/dL (ref 0.0–1.2)
CO2: 26 mmol/L (ref 20–29)
Calcium: 9.3 mg/dL (ref 8.7–10.3)
Chloride: 97 mmol/L (ref 96–106)
Creatinine, Ser: 0.8 mg/dL (ref 0.57–1.00)
Globulin, Total: 2.5 g/dL (ref 1.5–4.5)
Glucose: 125 mg/dL — ABNORMAL HIGH (ref 70–99)
Potassium: 3.5 mmol/L (ref 3.5–5.2)
Sodium: 140 mmol/L (ref 134–144)
Total Protein: 6.7 g/dL (ref 6.0–8.5)
eGFR: 77 mL/min/{1.73_m2} (ref >59)

## 2024-05-19 ENCOUNTER — Ambulatory Visit: Payer: Self-pay | Admitting: Cardiology

## 2024-05-19 LAB — LIPID PANEL
Chol/HDL Ratio: 2.3 ratio (ref 0.0–4.4)
Cholesterol, Total: 142 mg/dL (ref 100–199)
HDL: 61 mg/dL (ref >39)
LDL Chol Calc (NIH): 59 mg/dL (ref 0–99)
Triglycerides: 127 mg/dL (ref 0–149)
VLDL Cholesterol Cal: 22 mg/dL (ref 5–40)

## 2024-06-06 ENCOUNTER — Ambulatory Visit: Admitting: Internal Medicine

## 2024-06-07 NOTE — Progress Notes (Unsigned)
 OV 11/27/2019  Subjective:  Patient ID: [name], female , DOB: Nov 04, 1946 , age 78 y.o. , MRN: [id number] , ADDRESS: [address] [place] [address]   11/27/2019 -   Chief Complaint  Patient presents with   Follow-up    Former BQ pt. Pt states she does become SOB with activities and also at times when at rest. Pt also has an occ cough which is worse in the morning and will cough up clear phlegm.   Follow-up interstitial lung disease secondary to connective tissue disease [rheumatologist Elissa CANDIE Call, M.D., M.H.S. at [facility] but now Dr Blair at  [facility])  ILD: Dr Brien -> Dr Alaine -> DR Geronimo at ILD center [place] ,  HPI [name] 78 y.o. -presents to the ILD clinic for evaluation of her interstitial lung disease secondary to connective tissue disease (rheumatoid factor 90, ANA 1: 640, ANA second titer 1: 2000 560, positive SCL 70 August 2012 at [facility] and positive [place]].  I am meeting her for the first time.  Her rheumatology situation is managed by Dr. Blair at [facility] rheumatology [facility].  She [name] longer follows at [facility] after the doctor they relocated to [facility] [place] .  She is maintained on 5 mg prednisone  per day.  She is not on any other immunomodulators.  Her last TNF alpha blockade was over 3 years ago.  She is known to have interstitial lung disease.  Last staged in March 2019.  She tells me that since then she continues to have significant amount of cough and shortness of breath.  She believes the shortness of breath is slightly worse.  She thinks her pulmonary issues itself is stable.  She thinks the worsening shortness of breath and the persistent shortness of breath could be due to cardiac issues.  In 2015 she had a right heart catheterization evaluation that was normal.  Last echo was in 2015.  [name] further work-up since then.  Dyspnea symptom score is listed below.  She in addition she also has significant amount  of cough that she attributes to sinus drainage.  She is not had a CT scan of the sinus.  She says she is allergic to a few things but last tested several years ago.  She does not remember when.  She has seen ENT many years ago does not know the details.  At this point in time she would love to have her symptoms better managed.  IMPRESSION: HRCT Lungs/Pleura: Subpleural reticulation and traction bronchiectasis/bronchiolectasis are again seen without a definite zonal predominance. Findings are worst in the right middle lobe. Pattern and distribution appear stable from prior exams. [name] pleural fluid. Airway is unremarkable. [name] air trapping. 1. Pulmonary parenchymal pattern of fibrosis is stable from prior exams and likely due to fibrotic nonspecific interstitial pneumonitis. 2. Cirrhosis, steatosis. 3. Aortic atherosclerosis (ICD10-170.0). Coronary artery calcification.     Electronically Signed   By: Newell Eke M.D.   On: 03/17/2018 09:09  ROS - per HPI    OV 12/26/2019  Subjective:  Patient ID: [name], female , DOB: 1946-04-27 , age 78 y.o. , MRN: [id number] , ADDRESS: [id number] [place] [address]   12/26/2019 -   Chief Complaint  Patient presents with   Follow-up    Pt states she has been doing well since last visit and denies any complaints.   Follow-up interstitial lung disease secondary to connective tissue disease  [rheumatologist previously -  Adelfa CANDIE Call, M.D., M.H.S. at [facility] but now Dr Blair at  [facility])  - per records - ILD since 2009  - systemic sclerosis/RA overlap per [facility] notes  = Telangiectasias, Raynaud's, esophageal dysmotility - ? calcinosis on right heel. Per [facility] notes  - Serology   - 2012 at [facility]: (rheumatoid factor 90, ANA 1: 640, ANA second titer 1: 2000 560, positive SCL 70 August 2012).   -  [facility] + (low titer), ANA + (low titer), SCl 70 +  (in [facility])  - ILD: Dr Brien -> Dr Alaine -> DR Geronimo at ILD center  [place]   Rx History - [facility] initiated 12/2011 d/c due to lack of control of arthrits D. MTX initiated 04/2012 -discontinued due to concerns since patient has underlying lung disease   D- CHF - 2015 [facility]   - She had a right heart cath in April 2015 which showed the following:  Hemodynamic Findings:   PA: 43/18 (mean 31)   PCWP: 25   Fick Cardiac Index: 3.45 L/min/m2     xxxxxxxxxxxxxxxxxxxxxxxxxxxxxxxxxxxxxxxxxxxxxxxxxxxxxx HPI [name] 78 y.o. -returns for follow-up to discuss her test results.  She has interstitial lung disease secondary to connective tissue disease.  She has not been able to do her pulmonary function test because of the COVID-19 pandemic and the disruptions.  It is scheduled in the next few to several weeks.  She did have repeat serology and the overwhelmingly strong antibodies to scleroderma antibody.  The rheumatoid factor and ANA weakly positive only.  Her high-resolution CT chest scan of the chest according to thoracic radiology that I personally visualized shows stable pattern compared to 2013.  This is somewhat discrepant with the pulmonary function test that is shown decline and the symptoms are showing decline.  Of course the upcoming pulmonary function test is not available.  Nevertheless it is reassuring that the CT scan is stable.  We discussed her symptoms and it appears she does have diastolic dysfunction is based on the echo.  Clinically she is euvolemic.  She is also physically deconditioned and the BMI is high.  Her other big symptom burden is a cough secondary to significant postnasal drainage.  The CT scan of the sinus itself is normal.  She does have some deviated nasal septum.  Her blood IgE is normal. ANCA is normal 0 S1erology Jan 2021  Results for [name], [name] (MRN [id number]) as of 12/26/2019 11:49  Ref. Range 11/27/2019 12:43  Anti Nuclear Antibody (ANA) Latest Ref Range: NEGATIVE  POSITIVE (A)  ANA Pattern 1 Unknown Nuclear,  Speckled (A)  ANA Titer 1 Latest Units: titer 1:80 (H)  Cyclic Citrullin Peptide Ab Latest Units: UNITS <16  Myeloperoxidase Abs Latest Units: AI <1.0  Serine Protease 3 Latest Units: AI <1.0  RA Latex Turbid. Latest Ref Range: <14 IU/mL 17 (H)     IMPRESSIONS - echo jan 2021      1. Left ventricular ejection fraction, by visual estimation, is 60 to 65%. The left ventricle has normal function. There is moderately increased left ventricular hypertrophy.  2. Left ventricular diastolic parameters are consistent with Grade I diastolic dysfunction (impaired relaxation).  3. The left ventricle has [name] regional wall motion abnormalities.  4. Global right ventricle has normal systolic function.The right ventricular size is normal. [name] increase in right ventricular wall thickness.  5. Left atrial size was mildly dilated.  6. Right atrial size was normal.  7. The  mitral valve is normal in structure. [name] evidence of mitral valve regurgitation. [name] evidence of mitral stenosis.  8. The tricuspid valve is normal in structure.  9. The tricuspid valve is normal in structure. Tricuspid valve regurgitation is not demonstrated. 10. The aortic valve is tricuspid. Aortic valve regurgitation is not visualized. [name] evidence of aortic valve sclerosis or stenosis. 11. The pulmonic valve was not well visualized. Pulmonic valve regurgitation is not visualized. 12. The inferior vena cava is normal in size with greater than 50% respiratory variability, suggesting right atrial pressure of 3 mmHg.     Results for [name], [name] (MRN [id number]) as of 11/27/2019 12:15  Ref. Range 09/05/2013 15:00 03/07/2014 12:09 02/07/2015 10:34 09/22/2016 15:33 02/25/2018 14:59  FVC-Pre Latest Units: L ? 2.03 2.04 1.97 1.94 1.75  FVC-%Pred-Pre Latest Units: % 68 70 68 68 60   Results for [name], [name] (MRN [id number]) as of 11/27/2019 12:15  Ref. Range 09/05/2013 15:00 03/07/2014 12:09 02/07/2015 10:34 09/22/2016 15:33 02/25/2018 14:59   DLCO unc Latest Units: ml/min/mmHg 14.81 14.71 15.60 15.97 16.44  DLCO unc % pred Latest Units: % 68 68 72 74 71    IMPRESSION: HRCT JAn 2021 1. There is a spectrum of findings in the lungs compatible with interstitial lung disease, which appear stable compared to the prior examination. This is technically characterized as compatible with usual interstitial pneumonia (UIP) per current ATS guidelines. However, given the stability dating back to at least 2013 and the overall appearance, this is again favored to reflect fibrotic phase nonspecific interstitial pneumonia (NSIP). 2. Aortic atherosclerosis, in addition to left main and 3 vessel coronary artery disease. Assessment for potential risk factor modification, dietary therapy or pharmacologic therapy may be warranted, if clinically indicated. 3. Hepatic steatosis.   Aortic Atherosclerosis (ICD10-I70.0).     Electronically Signed   By: Toribio Aye M.D.   On: 12/19/2019 14:33  CT SINUS JAn 2021   EXAM: CT PARANASAL SINUS LIMITED WITHOUT CONTRAST   TECHNIQUE: Non-contiguous multidetector CT images of the paranasal sinuses were obtained in a single plane without contrast.   COMPARISON:  None.   FINDINGS: The included portions of the paranasal sinuses are clear without evidence of significant mucosal thickening or fluid. Rightward nasal septal deviation measures 3 mm. The regional soft tissues are grossly unremarkable.   IMPRESSION: Clear paranasal sinuses.     Electronically Signed   By: Dasie Hamburg M.D.   On: 12/19/2019 14:15    ECHO JAn 2021  IMPRESSIONS      1. Left ventricular ejection fraction, by visual estimation, is 60 to 65%. The left ventricle has normal function. There is moderately increased left ventricular hypertrophy.  2. Left ventricular diastolic parameters are consistent with Grade I diastolic dysfunction (impaired relaxation).  3. The left ventricle has [name] regional wall motion  abnormalities.  4. Global right ventricle has normal systolic function.The right ventricular size is normal. [name] increase in right ventricular wall thickness.  5. Left atrial size was mildly dilated.  6. Right atrial size was normal.  7. The mitral valve is normal in structure. [name] evidence of mitral valve regurgitation. [name] evidence of mitral stenosis.  8. The tricuspid valve is normal in structure.  9. The tricuspid valve is normal in structure. Tricuspid valve regurgitation is not demonstrated. 10. The aortic valve is tricuspid. Aortic valve regurgitation is not visualized. [name] evidence of aortic valve sclerosis or stenosis. 11. The pulmonic valve was not well visualized. Pulmonic valve regurgitation is not visualized.  12. The inferior vena cava is normal in size with greater than 50% respiratory variability, suggesting right atrial pressure of 3 mmHg.    OV 08/08/2020  Subjective:  Patient ID: [name], female , DOB: 1946/04/29 , age 36 y.o. , MRN: [id number] , ADDRESS: [address] [place] [address]   08/08/2020 -   Chief Complaint  Patient presents with   Follow-up    here to go over pft results    Follow-up interstitial lu HPI [name] 78 y.o. -presents for the above issues.  She is on observation therapy.  Overall she states that her shortness of breath and symptoms are the same although she is quite symptomatic.  She understands that she has multifactorial dyspnea from obesity, diastolic dysfunction, fitness and ILD.  She had a pulmonary function test that is documented below.  Shows continued decline in FVC but the DLCO is stable.  She could not attend pulmonary rehabilitation because sometime in the spring I suppose she fractured her left hand.  She is willing to reattend pulmonary rehabilitation.  We discussed the idea of starting antifibrotic's given the fact she has ILD associated with scleroderma.  But she has baseline mild diarrhea which is from irritable bowel  syndrome.  She says that GI has cleared her and she is [name] longer at follow-up with them.  But she is afraid to take nintedanib because of the diarrhea.  We discussed the alternative of taking immunosuppression such as CellCept and or Tocilizumab but she is definitely resistant to this idea because of the immunomodulation  In terms of vaccine: She is in need of having the new Shingrix vaccine.  She can benefit from a Covid booster.  She can also benefit from high-dose flu shot.  We discussed all this.      OV 06/19/2022  Subjective:  Patient ID: [name], female , DOB: 1946/04/28 , age 38 y.o. , MRN: [id number] , ADDRESS: [address] [place] [address] PCP Sheryle Carwin, MD Patient Care Team: Sheryle Carwin, MD as PCP - General (Internal Medicine) Pietro Redell RAMAN, MD as PCP - Cardiology (Cardiology) Brien Belvie BRAVO, MD as Attending Physician (Pulmonary Disease)  This Provider for this visit: Treatment Team:  Attending Provider: Geronimo Amel, MD    06/19/2022 -   Chief Complaint  Patient presents with   Follow-up    Follow-up SOB, ILD, with normal cough.     HPI [name] 78 y.o. -returns for follow-up.  I personally have not seen this in September 2021.  She states that she did not follow-up other than 1 seeing the nurse practitioner because overall she was doing well.  She felt she did not see need to.  She is aware in the past that we discussed antifibrotic nintedanib because of the UIP pattern that she has on her CT chest.  She seems more interested in therapeutic options at this visit.  She initially said [name] change in her dyspnea but as I spoke to her she said maybe she is slightly worse.  She says that when she fixes do not takes a shower she has to take 5-10 minutes rest because of shortness of breath and then she exerts.  Overall dyspnea score is stable but dyspnea for stairs is worse.  There is [name] interim hospitalization or ER visits or medication changes.   She has recently had cataract surgery and has a pending right cataract surgery.  We discussed patient support group.  She is  very interested in connecting with Ms. Arland who is his local support group leader.  Her husband is here with her.  Husband is sleep apnea and has new consult visit July 01, 2022 with Almarie Ferrari.  He had some questions.  I have asked the CMA to record his questions and send it to Almarie Ferrari.     PFT  OV 08/20/2022  Subjective:  Patient ID: [name], female , DOB: [birth year] , age 56 y.o. , MRN: [id number] , ADDRESS: [address] [place] [address] PCP Sheryle Carwin, MD Patient Care Team: Sheryle Carwin, MD as PCP - General (Internal Medicine) Pietro Redell RAMAN, MD as PCP - Cardiology (Cardiology) Brien Belvie BRAVO, MD as Attending Physician (Pulmonary Disease)  This Provider for this visit: Treatment Team:  Attending Provider: Geronimo Amel, MD    08/20/2022 -   Chief Complaint  Patient presents with   Follow-up    Pt states she is about the same since last visit and denies any complaints.    HPI [name] 78 y.o. -returns for follow-up.  This visit is tod iscuss test results.  She did have echocardiogram that shows grade 1 diastolic dysfunction which is to be expected given her age and obesity.  She had pulmonary function test that shows a decline much as suspected both in the FVC and DLCO compared to 2 years ago.  She was supposed to have overnight pulse oximetry but according to the CMA an order was done but the test was not done.  She also did not show up for a high-resolution CT scan of the chest at [facility].  In discussions with her it appears that she believes that her husband took the phone call.  Husband has early onset dementia and might of forgotten.  The same is now put orders to get the stool test done with patient cell phone being the primary contact.  Patient will now reschedule the visit.  She will need to  get CT scan of the chest done in 010 done before we can make further progress.  Nevertheless she appears to have progressive phenotype       09/28/2022 Patient presents today for follow-up visit.  Patient was scheduled for a follow-u visit on 11/30 with Dr. Geronimo but came into the office today looking to go over recent CT imaging results.  Patient had overnight oximetry test on 08/27/2022 that showed very mild oxygen  desaturation, pulse ox <88% for 5 minutes and 33 seconds. CT chest in October 2023 showed unchanged appearance of lungs, findings considered diagnostic for UIP.  However given stability compared with studies dating back to 2013 alternative diagnoses such as NSIP is of consideration. She is on Plaquenil and prednisone  5mg  daily for RA, symptoms are well control. She would like to hold off on starting nocturnal oxygen . Recent dx breast cancer, planning for lumpectomy.      OV 12/31/2022  Subjective:  Patient ID: [name], female , DOB: September 11, 1946 , age 61 y.o. , MRN: [id number] , ADDRESS: [address] [place] [address] PCP Sheryle Carwin, MD Patient Care Team: Sheryle Carwin, MD as PCP - General (Internal Medicine) Pietro Redell RAMAN, MD as PCP - Cardiology (Cardiology) Brien Belvie BRAVO, MD as Attending Physician (Pulmonary Disease) Tyree Nanetta SAILOR, RN as Oncology Nurse Navigator Glean Stephane BROCKS, RN as Oncology Nurse Navigator Vanderbilt Ned, MD as Consulting Physician (General Surgery) Odean Potts, MD as Consulting Physician (Hematology and Oncology) Dewey Rush, MD as  Consulting Physician (Radiation Oncology)  This Provider for this visit: Treatment Team:  Attending Provider: Geronimo Amel, MD 12/31/2022 -   Chief Complaint  Patient presents with   Follow-up    [name] c/o     HPI [name] 78 y.o. -returns for follow-up.  Since her last visit she fell and tore the tendons in her right shoulder.  She has upcoming surgery in 2 weeks.  She has  problems with abduction.  In addition she got diagnosed with breast cancer.  Was early stage.  She is on Arimidex .  She has seen Dr. Gudena per review of the external medical records.  She had a left lumpectomy 11/04/2022 grade 1.  Tolerated the surgery quite well.  She is being monitored.  She feels stable from a respiratory standpoint.  Symptoms are stable below.  She continues to see rheumatology Dr. Mai.  She is on Plaquenil and low-dose prednisone .  In the fall 2023 her CT scan of the chest LAD in a few years that she might be progressing with her ILD.  Current symptom score is as below and overall is range bound. \    03/10/2024- Interim hx  Discussed the use of AI scribe software for clinical note transcription with the patient, who gave verbal consent to proceed.  History of Present Illness   [name] is a 78 year old female with interstitial lung disease related to scleroderma who presents for follow-up of her pulmonary condition.  She has interstitial lung disease related to scleroderma, which appears to be well controlled. She experiences increased difficulty during pollen season, a recurring issue each year. A CT scan in October 2023 showed stable fibrosis, and a pulmonary function test in February 2025 indicated moderate obstructive airway disease with a lung function of 61% predicted, an improvement from 56% predicted in August 2023. She does not use supplemental oxygen  and maintains an oxygen  saturation between 93-97%.  She experiences shortness of breath primarily with exertion, such as walking up stairs, which she rates as a four on a scale of zero to five. At rest, she has [name] shortness of breath, and during simple tasks like showering or changing clothes, she rates it as a one. She manages her symptoms by pausing activities when she feels short of breath.  She has a cough that worsens during pollen season, rating it as a two. She also experiences fatigue rated as a  two.  Her current medications include Plaquenil 400 mg in the morning, Norvasc , aspirin  81 mg, Flexeril  as needed, Lasix  80 mg daily, nortriptyline 30 mg at bedtime, Protonix , prednisone  5 mg in the morning, Crestor  10 mg, and Ambien  10 mg at bedtime as needed. She also uses ipratropium nasal spray for allergies, which she finds effective.      PFTs in February 2025>> moderate obstructive airway disease/ FEV 1.24 (61%), ratio 76. DLCO un 13.80 (74%   OV 06/08/2024  Subjective:  Patient ID: [name], female , DOB: 1946/02/05 , age 62 y.o. , MRN: [id number] , ADDRESS: [address] [place] [address] PCP Sheryle Carwin, MD Patient Care Team: Sheryle Carwin, MD as PCP - General (Internal Medicine) Pietro Redell RAMAN, MD as PCP - Cardiology (Cardiology) Brien Belvie BRAVO, MD as Attending Physician (Pulmonary Disease) Vanderbilt Ned, MD as Consulting Physician (General Surgery) Odean Potts, MD as Consulting Physician (Hematology and Oncology) Dewey Rush, MD as Consulting Physician (Radiation Oncology)  This Provider for this visit: Treatment Team:  Attending Provider: Geronimo Amel,  MD    ILD secondary to connective tissue disease  [rheumatologist previously - Adelfa CANDIE Call, M.D., M.H.S. at [facility] but now Dr Blair at  [facility])  - per records - ILD since 2009  - systemic sclerosis/RA overlap per [facility] notes  = Telangiectasias, Raynaud's, esophageal dysmotility - ? calcinosis on right heel. Per [facility] notes  - Serology   - 2012 at [facility]: (rheumatoid factor 90, ANA 1: 640, ANA second titer 1: 2000 560, positive SCL 70 August 2012).   -  [facility] + (low titer), ANA + (low titer), SCl 70 +  (in [facility])  -High-resolution CT chest January 2021 with UIP features [stable since 2013] -> stable oct 2023  - ILD: Dr Brien -> Dr Alaine -> DR Geronimo at ILD center [place]     [facility] History - [facility] 12/2011 d/c due to lack of control of arthrits . MTX initiated  04/2012 -discontinued due to concerns since patient has underlying lung disease Plaquenil and prednisone  as of July 2023 and feb 2024   D- CHF -   [facility]  -  - She had a right heart cath in April 2015 which showed the following: Hemodynamic Findings:   PA: 43/18 (mean 31)   PCWP: 25   Fick Cardiac Index: 3.45 L/min/m2    -Grade 1 diastolic dysfunction on echocardiogram January 2021   06/08/2024 -   Chief Complaint  Patient presents with   Follow-up    Pt wants to go over chest CT results.      HPI [name] 78 y.o. -presents for follow-up.  She was seen in April 2025 by nurse practitioner.  Here for follow-up.  She in February had pulmonary function test that showed continued stability.  Today she is here after CT scan.  I presume visualize a CT scan she has stable ILD for the last 2 years now.  From a respiratory standpoint she continues to be stable.  Her exercise hypoxemia test is also stable.  She is more concerned about onychomycosis of the right index finger.  This started a few weeks ago.  She is now on cephalexin  but is giving her intense nausea and diarrhea.  I advised her to talk to primary care about it.  The Raynaud's is also acting up.  The CT scan reported new thyroid nodule which she is not aware of from the past.  Also reported cirrhosis.  We came up with a monitoring plan for this and an approach for this.     SYMPTOM SCALE - ILD 12/26/2019   08/08/2020 198# 06/19/2022 197# 08/20/2022   09/28/2022  12/31/2022  06/08/2024   O2 use ra ra ra   RA ra ra  Shortness of Breath 0 -> 5 scale with 5 being worst (score 6 If unable to do)           At rest 0 0 0 2 0 0 0  Simple tasks - showers, clothes change, eating, shaving 3 2 1 3 2 1 3   Household (dishes, doing bed, laundry) 3 3 1 2 2 2 3   Shopping  2 2 1 3 2 3  0  Walking level at own pace 3 2 1.5 2 0 2 0  Walking up Stairs 2 3 4 4 4 4 3   Total (30-36) Dyspnea Score 14 12 8.5 16 10 12 9   How bad is your cough? 3 3 At  times   2 0 2  How bad is your fatigue 3 3 1 2 2 2 4   How bad is nausea 3 2 0 0 0 0 0  How bad is vomiting?   0 0 0 0 0 0 0  How bad is diarrhea? 3 0 1 0 0 0 2  How bad is anxiety? 3 3 2 2  4- recent breast cancer dx 2 4  How bad is depression 3 3 2 1 3 2 4        Simple office walk 185 feet x  3 laps goal with forehead probe 08/08/2020  06/19/2022  08/20/2022  12/31/2022   O2 used ra ra ra ra  Number laps completed 3 3 3 3   Comments about pace Mild steady pace Slow pace r   Resting Pulse Ox/HR 96% and 79/min 100% and 90 99% and HR 91 97% n  Final Pulse Ox/HR 94% and 101/min 94% and HR 108 92% and HR 104 93%  Desaturated </= 88% [name] [name]    Desaturated <= 3% points [name] Yes, 6 Yes 7 points Yes 4 points  Got Tachycardic >/= 90/min yes yes yes   Symptoms at end of test none Some dyspnea mild [name] dspnea  Miscellaneous comments x   stable        SIT STAND TEST - goal 15 times   06/08/2024    O2 used ra   PRobe - finter or forehead foreead   Number sit and stand completed - goal 15 15   Time taken to complete 1 min and 44 sec slow   Resting Pulse Ox/HR/Dyspnea  100% and 79/min and dyspnea of 0/10    Peak measures 95 % and 87/min and dyspnea of 1/10   Final Pulse Ox/HR 97% and 88/min and dyspnea of 1/10   Desaturated </= 88% [name]   Desaturated <= 3% points [name]   Got Tachycardic >/= 90/min [name]   Miscellaneous comments Very slow      CT Chest data from date: *June 2025  - personally visualized and independently interpreted : yes - my findings are: as belw Narrative & Impression  CLINICAL DATA:  Diffuse interstitial lung disease. History of breast cancer.   EXAM: CT CHEST WITHOUT CONTRAST   TECHNIQUE: Multidetector CT imaging of the chest was performed following the standard protocol without intravenous contrast. High resolution imaging of the lungs, as well as inspiratory and expiratory imaging, was performed.   RADIATION DOSE REDUCTION: This exam was performed according to  the departmental dose-optimization program which includes automated exposure control, adjustment of the mA and/or kV according to patient size and/or use of iterative reconstruction technique.   COMPARISON:  08/31/2022, 12/10/2020.   FINDINGS: Cardiovascular: Atherosclerotic calcification of the aorta, aortic valve and coronary arteries. Enlarged pulmonic trunk and heart. [name] pericardial effusion.   Mediastinum/Nodes: Heterogeneous right thyroid nodule measures 2.8 cm. [name] pathologically enlarged mediastinal or axillary lymph nodes. Hilar regions are difficult to definitively evaluate without IV contrast. Air in the esophagus can be seen with dysmotility.   Lungs/Pleura: Peripheral and basilar predominant traction bronchiectasis/bronchiolectasis, coarsened ground-glass, subpleural reticular densities and honeycombing, unchanged from 08/31/2022. [name] pleural fluid. Airway is unremarkable.   Upper Abdomen: Liver margin is irregular. Cholecystectomy. Small low-attenuation left adrenal nodule, compatible with an adenoma. [name] specific follow-up necessary. Small right renal stone. Probable small right renal sinus cyst. [name] specific follow-up necessary. Visualized portions of the liver, adrenal glands, kidneys, spleen, pancreas, stomach and bowel are otherwise grossly unremarkable. [name] upper abdominal adenopathy.   Musculoskeletal:  Degenerative changes in the spine. Osteopenia. Slight compression of the T3, T4 and T5 superior endplates, old.   IMPRESSION: 1. Stable interstitial lung disease. Findings are consistent with UIP per consensus guidelines: Diagnosis of Idiopathic Pulmonary Fibrosis: An Official ATS/ERS/JRS/ALAT Clinical Practice Guideline. Am JINNY [employer] Crit Care Med Vol 198, Iss 5, [id number], Jul 31 2017. 2. 2.8 cm right thyroid nodule. Recommend thyroid ultrasound. (Ref: J Am Coll Radiol. 2015 Feb;12(2): 143-50). 3. Cirrhosis. 4. Left adrenal adenoma. 5. Small right renal  stone. 6. Aortic atherosclerosis (ICD10-I70.0). Coronary artery calcification. 7. Enlarged pulmonic trunk, indicative of pulmonary arterial hypertension.     Electronically Signed   By: Newell Eke M.D.   On: 05/14/2024 16:09      PFT     Latest Ref Rng & Units 01/26/2024    1:26 PM 07/27/2022    4:11 PM 07/23/2020   11:58 AM 02/25/2018    2:59 PM 09/22/2016    3:33 PM 02/07/2015   10:34 AM 03/07/2014   12:09 PM  PFT Results  FVC-Pre L 1.63  1.51  1.70  1.75  1.94  1.97  2.04   FVC-Predicted Pre % 60  55  60  60  68  68  70   FVC-Post L    1.78  1.92  1.94    FVC-Predicted Post %    61  68  67    Pre FEV1/FVC % % 76  76  73  77  74  76  75   Post FEV1/FCV % %    78  79  80    FEV1-Pre L 1.24  1.15  1.25  1.35  1.44  1.50  1.54   FEV1-Predicted Pre % 61  56  59  62  67  68  69   FEV1-Post L    1.38  1.51  1.56    DLCO uncorrected ml/min/mmHg 13.80  14.37  16.83  16.44  15.97  15.60  14.71   DLCO UNC% % 74  77  90  71  74  72  68   DLCO corrected ml/min/mmHg 13.80  14.37  16.83   15.87     DLCO COR %Predicted % 74  77  90   73     DLVA Predicted % 124  122  142  122  114  115  111   TLC L     3.40  2.92    TLC % Predicted %     71  61    RV % Predicted %     72  50         LAB RESULTS last 96 hours [name] results found.       has a past medical history of Cancer (HCC), Diastolic congestive heart failure (HCC), GERD (gastroesophageal reflux disease), Hepatic steatosis, History of blood transfusion, cholecystectomy, Hypertension, Interstitial lung disease (HCC), Osteoarthritis, Pulmonary fibrosis (HCC), Raynaud disease, Rheumatoid arthritis(714.0), Right BBB/left ant fasc block, Scleroderma (HCC), Seasonal allergies, Shortness of breath dyspnea, and Systemic sclerosis (HCC).   reports that she quit smoking about 35 years ago. Her smoking use included cigarettes. She started smoking about 55 years ago. She has a 20 pack-year smoking history. She has never used smokeless  tobacco.  Past Surgical History:  Procedure Laterality Date   ABDOMINAL HYSTERECTOMY     BLADDER SUSPENSION     BREAST BIOPSY  11/03/2022   MM LT RADIOACTIVE SEED LOC MAMMO GUIDE 11/03/2022 [facility] MAMMOGRAPHY   BREAST LUMPECTOMY  WITH RADIOACTIVE SEED LOCALIZATION Left 11/04/2022   Procedure: LEFT BREAST LUMPECTOMY WITH RADIOACTIVE SEED LOCALIZATION;  Surgeon: Vanderbilt Ned, MD;  Location: [facility] OR;  Service: General;  Laterality: Left;   CARPAL TUNNEL RELEASE Right 2007   EYE SURGERY Bilateral 2023   cataract w/ IOL   LAPAROSCOPIC CHOLECYSTECTOMY     LEFT HEART CATHETERIZATION WITH CORONARY/GRAFT ANGIOGRAM  03/15/2014   Procedure: LEFT HEART CATHETERIZATION WITH EL BILE;  Surgeon: Lonni JONETTA Cash, MD;  Location: [facility] CATH LAB;  Service: Cardiovascular;;   REVERSE SHOULDER ARTHROPLASTY Right 05/06/2023   Procedure: REVERSE SHOULDER ARTHROPLASTY;  Surgeon: Melita Drivers, MD;  Location: [facility] ORS;  Service: Orthopedics;  Laterality: Right;    TONSILLECTOMY     TOTAL KNEE ARTHROPLASTY Left 02/11/2015   Procedure: LEFT TOTAL KNEE ARTHROPLASTY, RIGHT KNEE CORTISONE INJECTION;  Surgeon: Dempsey Moan, MD;  Location: [facility];  Service: Orthopedics;  Laterality: Left;   TOTAL KNEE ARTHROPLASTY Right 10/05/2016   Procedure: RIGHT TOTAL KNEE ARTHROPLASTY;  Surgeon: Dempsey Moan, MD;  Location: [facility];  Service: Orthopedics;  Laterality: Right;   VESICOVAGINAL FISTULA CLOSURE W/ TAH  11/30/1985    Allergies  Allergen Reactions   Codeine     Neurological problems - hallucinations, spacey    Immunization History  Administered Date(s) Administered   Influenza Split 08/01/2011, 09/02/2012, 08/30/2013, 08/29/2016   Influenza, High Dose Seasonal PF 08/28/2017   Influenza,inj,Quad PF,6+ Mos 09/03/2014   Influenza-Unspecified 08/14/2014, 07/20/2019, 08/15/2019   Moderna Sars-Covid-2 Vaccination 12/21/2018, 01/24/2020   Pneumococcal Conjugate-13 09/19/2014, 11/28/2014    Pneumococcal Polysaccharide-23 11/30/2008, 08/30/2009   Zoster, Live 11/30/2008    Family History  Problem Relation Age of Onset   Stroke Mother    Heart disease Mother        CABG at age 108   Heart attack Father        Died of MI at age 36   Emphysema Father    Dementia Brother    Osteoarthritis Other    Celiac disease Other      Current Outpatient Medications:    acetaminophen  (TYLENOL ) 500 MG tablet, Take 1,000 mg by mouth every 6 (six) hours as needed for moderate pain., Disp: , Rfl:    albuterol  (VENTOLIN  HFA) 108 (90 Base) MCG/ACT inhaler, INHALE 2 PUFFS INTO THE LUNGS EVERY 6 (SIX) HOURS AS NEEDED FOR WHEEZING., Disp: 1 each, Rfl: 1   amLODipine  (NORVASC ) 5 MG tablet, Take 5 mg by mouth in the morning., Disp: , Rfl:    anastrozole  (ARIMIDEX ) 1 MG tablet, Take 1 tablet (1 mg total) by mouth daily., Disp: 90 tablet, Rfl: 3   aspirin  EC 81 MG tablet, Take 81 mg by mouth at bedtime., Disp: , Rfl:    azelastine  (ASTELIN ) 0.1 % nasal spray, Place 1 spray into both nostrils 2 (two) times daily as needed for rhinitis. Use in each nostril as directed, Disp: , Rfl:    cyclobenzaprine  (FLEXERIL ) 10 MG tablet, Take 1 tablet (10 mg total) by mouth 3 (three) times daily as needed for muscle spasms., Disp: 30 tablet, Rfl: 1   furosemide  (LASIX ) 40 MG tablet, TAKE 2 TABLETS EVERY DAY, Disp: 60 tablet, Rfl: 0   hydroxychloroquine (PLAQUENIL) 200 MG tablet, Take 400 mg by mouth in the morning., Disp: , Rfl:    ipratropium (ATROVENT ) 0.03 % nasal spray, Place 2 sprays into both nostrils every 12 (twelve) hours., Disp: 30 mL, Rfl: 1   nortriptyline (PAMELOR) 10 MG capsule, Take 30 mg by mouth at bedtime.,  Disp: , Rfl:    ondansetron  (ZOFRAN ) 4 MG tablet, Take 1 tablet (4 mg total) by mouth every 8 (eight) hours as needed for nausea or vomiting., Disp: 10 tablet, Rfl: 0   oxyCODONE -acetaminophen  (PERCOCET) 5-325 MG tablet, Take 1 tablet by mouth every 4 (four) hours as needed (max 6 q)., Disp: 20  tablet, Rfl: 0   pantoprazole  (PROTONIX ) 40 MG tablet, Take 40 mg by mouth 2 (two) times daily., Disp: , Rfl:    Povidone, PF, (IVIZIA DRY EYES) 0.5 % SOLN, Place 1-2 drops into both eyes 3 (three) times daily as needed (dry/irritated eyes.)., Disp: , Rfl:    predniSONE  (DELTASONE ) 5 MG tablet, Take 5 mg by mouth in the morning., Disp: , Rfl:    rosuvastatin  (CRESTOR ) 10 MG tablet, Take 1 tablet (10 mg total) by mouth daily., Disp: 30 tablet, Rfl: 0   zolpidem  (AMBIEN ) 10 MG tablet, Take 10 mg by mouth at bedtime as needed for sleep., Disp: , Rfl:    Homeopathic Products (ZINC) LOZG, Take 1 tablet by mouth daily as needed (immune support). (Patient not taking: Reported on 06/08/2024), Disp: , Rfl:    metoprolol  tartrate (LOPRESSOR ) 100 MG tablet, Take one tablet two hours prior to cardiac CTA. (Patient not taking: Reported on 06/08/2024), Disp: 1 tablet, Rfl: 0      Objective:   Vitals:   06/08/24 0835 06/08/24 0839  BP: (!) 120/54 (!) 120/54  Pulse: 79 79  Temp: 97.9 F (36.6 C)   TempSrc: Oral   SpO2: 94% 94%  Weight: 182 lb 9.6 oz (82.8 kg)   Height: 5' 3 (1.6 m)     Estimated body mass index is 32.35 kg/m as calculated from the following:   Height as of this encounter: 5' 3 (1.6 m).   Weight as of this encounter: 182 lb 9.6 oz (82.8 kg).  @WEIGHTCHANGE @  [employer]   06/08/24 0835  Weight: 182 lb 9.6 oz (82.8 kg)     Physical Exam   General: [name] distress. Ooks wel O2 at rest: [name] Cane present: [name] Sitting in wheel chair: [name] Frail: [name] Obese: [name] Neuro: Alert and Oriented x 3. GCS 15. Speech normal Psych: Pleasant Resp:  Barrel Chest - [name].  Wheeze - [name], Crackles - YES, [name] overt respiratory distress CVS: Normal heart sounds. Murmurs - [name] Ext: Stigmata of Connective Tissue Disease - SCLERDORMA HEENT: Normal upper airway. PEERL +. [name] post nasal drip. RITH INDEX FINGER NAIL BED INFECTION        Assessment:       ICD-10-CM   1. Interstitial lung disease due to  connective tissue disease (HCC)  J84.89 Pulmonary function test   M35.9 US  Abdomen Limited RUQ (LIVER/GB)    2. Systemic sclerosis (HCC)  M34.9 Pulmonary function test    US  Abdomen Limited RUQ (LIVER/GB)    3. Raynaud's disease without gangrene  I73.00 Pulmonary function test    US  Abdomen Limited RUQ (LIVER/GB)    4. Onychomycosis  B35.1 Pulmonary function test    US  Abdomen Limited RUQ (LIVER/GB)    5. Thyroid nodule  E04.1 Pulmonary function test    US  Abdomen Limited RUQ (LIVER/GB)    6. Cirrhosis of liver without ascites, unspecified hepatic cirrhosis type (HCC)  K74.60 Pulmonary function test    US  Abdomen Limited RUQ (LIVER/GB)         Plan:     Patient Instructions  Interstitial lung disease due to connective tissue disease (HCC) Scleroderma (HCC)   -  pulmonary fibrosis stable clinicallly on cT and symptoms x 2years  Plan - continue monitoring approach and consider immunomodulatory medications or antifibrotic if there is worsening - -do spirometry and dlco in 4-6 monhts - continue primary treatment of scleroderma through rheum   #Onychomycosis of the right index finger  -Noted cephalexin  given nausea and diarrhea  Plan - According to primary care doctor -please contact  #Thyroid nodule reported on CT scan of the chest June 2025  Plan - Please contact primary care doctor  #Cirrhosis reported on CT scan of the chest June 2025  Plan - Get right upper quadrant ultrasound next few to several weeks and then discussed with primary care physician  Followup 15 min visit in 4-6 months after PFT  =- symptoms score and walk test at followu   FOLLOWUP Return in about 5 months (around 11/08/2024) for 15 min visit, after Spiro and DLCO, with any of the APPS, with Dr Geronimo, Face to Face Visit.    SIGNATURE    Dr. Dorethia Geronimo, M.D., F.C.C.P,  Pulmonary and Critical Care Medicine Staff Physician, [facility] Center Director - Interstitial  Lung Disease  Program  Pulmonary Fibrosis [facility] at [facility] [place], [place], [address]  Pager: [phone], If [name] answer or between  15:00h - 7:00h: call [phone] Telephone: [phone]  9:36 AM 06/08/2024

## 2024-06-07 NOTE — Patient Instructions (Signed)
Interstitial lung disease due to connective tissue disease ([name]) Scleroderma ([name])   - pulmonary fibrosis stable clinically . - Echo fall 2023 is normal - PFT aug 2023 declining but symptoms Jan 2024 and HRCT oct 2023 are strable   Plan - -do spirometry and dlco in 4-6 monhts - continue primary treatment of scleroderma through rheum   Followup 30 min visit in 4-6 months after PFT  =- symptoms score and walk test at followu

## 2024-06-08 ENCOUNTER — Ambulatory Visit: Admitting: Internal Medicine

## 2024-06-08 VITALS — BP 120/54 | HR 79 | Temp 97.9°F | Ht 63.0 in | Wt 182.6 lb

## 2024-06-08 DIAGNOSIS — I7300 Raynaud's syndrome without gangrene: Secondary | ICD-10-CM

## 2024-06-08 DIAGNOSIS — E041 Nontoxic single thyroid nodule: Secondary | ICD-10-CM

## 2024-06-08 DIAGNOSIS — M349 Systemic sclerosis, unspecified: Secondary | ICD-10-CM

## 2024-06-08 DIAGNOSIS — J8489 Other specified interstitial pulmonary diseases: Secondary | ICD-10-CM | POA: Diagnosis not present

## 2024-06-08 DIAGNOSIS — M359 Systemic involvement of connective tissue, unspecified: Secondary | ICD-10-CM | POA: Diagnosis not present

## 2024-06-08 DIAGNOSIS — B351 Tinea unguium: Secondary | ICD-10-CM | POA: Diagnosis not present

## 2024-06-08 DIAGNOSIS — K7460 Unspecified cirrhosis of liver: Secondary | ICD-10-CM

## 2024-06-13 ENCOUNTER — Ambulatory Visit (HOSPITAL_COMMUNITY)
Admission: RE | Admit: 2024-06-13 | Discharge: 2024-06-13 | Disposition: A | Discharge status: Home / Self Care | Source: Ambulatory Visit | Attending: Internal Medicine | Admitting: Internal Medicine

## 2024-06-13 DIAGNOSIS — I358 Other nonrheumatic aortic valve disorders: Secondary | ICD-10-CM | POA: Insufficient documentation

## 2024-06-13 DIAGNOSIS — R0609 Other forms of dyspnea: Secondary | ICD-10-CM | POA: Diagnosis not present

## 2024-06-13 LAB — ECHOCARDIOGRAM COMPLETE
AR max vel: 2.15 cm2
AV Area VTI: 2.04 cm2
AV Area mean vel: 2.15 cm2
AV Mean grad: 8 mmHg
AV Peak grad: 14.7 mmHg
Ao pk vel: 1.92 m/s
Area-P 1/2: 3.42 cm2
S' Lateral: 2.3 cm

## 2024-06-13 NOTE — Progress Notes (Signed)
*  PRELIMINARY RESULTS* Echocardiogram 2D Echocardiogram has been performed.  [name] 06/13/2024, 3:46 PM

## 2024-06-21 ENCOUNTER — Ambulatory Visit (HOSPITAL_COMMUNITY)
Admission: RE | Admit: 2024-06-21 | Discharge: 2024-06-21 | Disposition: A | Discharge status: Home / Self Care | Source: Ambulatory Visit | Attending: Internal Medicine | Admitting: Internal Medicine

## 2024-06-21 DIAGNOSIS — J8489 Other specified interstitial pulmonary diseases: Secondary | ICD-10-CM | POA: Insufficient documentation

## 2024-06-21 DIAGNOSIS — B351 Tinea unguium: Secondary | ICD-10-CM | POA: Insufficient documentation

## 2024-06-21 DIAGNOSIS — I7300 Raynaud's syndrome without gangrene: Secondary | ICD-10-CM | POA: Insufficient documentation

## 2024-06-21 DIAGNOSIS — E041 Nontoxic single thyroid nodule: Secondary | ICD-10-CM | POA: Insufficient documentation

## 2024-06-21 DIAGNOSIS — M359 Systemic involvement of connective tissue, unspecified: Secondary | ICD-10-CM | POA: Diagnosis present

## 2024-06-21 DIAGNOSIS — M349 Systemic sclerosis, unspecified: Secondary | ICD-10-CM | POA: Diagnosis present

## 2024-06-21 DIAGNOSIS — K7460 Unspecified cirrhosis of liver: Secondary | ICD-10-CM | POA: Diagnosis present

## 2024-06-22 NOTE — Telephone Encounter (Signed)
 Called patient advised of below they verbalized understanding.

## 2024-06-22 NOTE — Telephone Encounter (Signed)
-----   Message from Katlyn D West sent at 06/16/2024  1:33 PM EDT ----- Please let [name] know that her heart squeeze and function is normal.  There is evidence of some thickening of the muscle of the left lower chamber of the heart as well as some mild  stiffening, this is common with aging and a history of hypertension, will need to work towards good blood pressure control.  There is some mild thickening of the aortic valve without any evidence of  narrowing, this will be monitored on future echocardiograms.  Overall good results.  We will discuss in further detail at follow-up visit. ----- Message ----- From: Interface, [employer] Sent: 06/13/2024   5:21 PM EDT To: Katlyn D West, NP

## 2024-07-10 ENCOUNTER — Emergency Department (HOSPITAL_COMMUNITY)

## 2024-07-10 ENCOUNTER — Other Ambulatory Visit: Payer: Self-pay

## 2024-07-10 ENCOUNTER — Inpatient Hospital Stay (HOSPITAL_COMMUNITY)
Admission: EM | Admit: 2024-07-10 | Discharge: 2024-07-13 | DRG: 872 | Disposition: A | Discharge status: Home / Self Care | Source: Ambulatory Visit | Attending: Family Medicine | Admitting: Family Medicine

## 2024-07-10 ENCOUNTER — Encounter (HOSPITAL_COMMUNITY): Payer: Self-pay

## 2024-07-10 DIAGNOSIS — Z96653 Presence of artificial knee joint, bilateral: Secondary | ICD-10-CM | POA: Diagnosis present

## 2024-07-10 DIAGNOSIS — Z87891 Personal history of nicotine dependence: Secondary | ICD-10-CM | POA: Diagnosis not present

## 2024-07-10 DIAGNOSIS — Z7952 Long term (current) use of systemic steroids: Secondary | ICD-10-CM

## 2024-07-10 DIAGNOSIS — Z823 Family history of stroke: Secondary | ICD-10-CM

## 2024-07-10 DIAGNOSIS — Z853 Personal history of malignant neoplasm of breast: Secondary | ICD-10-CM | POA: Diagnosis not present

## 2024-07-10 DIAGNOSIS — Z96611 Presence of right artificial shoulder joint: Secondary | ICD-10-CM | POA: Diagnosis present

## 2024-07-10 DIAGNOSIS — R197 Diarrhea, unspecified: Secondary | ICD-10-CM | POA: Diagnosis present

## 2024-07-10 DIAGNOSIS — J8410 Pulmonary fibrosis, unspecified: Secondary | ICD-10-CM | POA: Diagnosis present

## 2024-07-10 DIAGNOSIS — N3000 Acute cystitis without hematuria: Principal | ICD-10-CM | POA: Diagnosis present

## 2024-07-10 DIAGNOSIS — M069 Rheumatoid arthritis, unspecified: Secondary | ICD-10-CM | POA: Diagnosis present

## 2024-07-10 DIAGNOSIS — M349 Systemic sclerosis, unspecified: Secondary | ICD-10-CM | POA: Diagnosis present

## 2024-07-10 DIAGNOSIS — N390 Urinary tract infection, site not specified: Secondary | ICD-10-CM

## 2024-07-10 DIAGNOSIS — K219 Gastro-esophageal reflux disease without esophagitis: Secondary | ICD-10-CM | POA: Diagnosis present

## 2024-07-10 DIAGNOSIS — A4151 Sepsis due to Escherichia coli [E. coli]: Principal | ICD-10-CM | POA: Diagnosis present

## 2024-07-10 DIAGNOSIS — I5032 Chronic diastolic (congestive) heart failure: Secondary | ICD-10-CM | POA: Diagnosis present

## 2024-07-10 DIAGNOSIS — I110 Hypertensive heart disease with heart failure: Secondary | ICD-10-CM | POA: Diagnosis present

## 2024-07-10 DIAGNOSIS — Z66 Do not resuscitate: Secondary | ICD-10-CM | POA: Diagnosis present

## 2024-07-10 DIAGNOSIS — Z7982 Long term (current) use of aspirin: Secondary | ICD-10-CM

## 2024-07-10 DIAGNOSIS — Z820 Family history of epilepsy and other diseases of the nervous system: Secondary | ICD-10-CM

## 2024-07-10 DIAGNOSIS — Z8379 Family history of other diseases of the digestive system: Secondary | ICD-10-CM

## 2024-07-10 DIAGNOSIS — Z79811 Long term (current) use of aromatase inhibitors: Secondary | ICD-10-CM

## 2024-07-10 DIAGNOSIS — E871 Hypo-osmolality and hyponatremia: Secondary | ICD-10-CM | POA: Diagnosis present

## 2024-07-10 DIAGNOSIS — I7300 Raynaud's syndrome without gangrene: Secondary | ICD-10-CM | POA: Diagnosis present

## 2024-07-10 DIAGNOSIS — Z885 Allergy status to narcotic agent status: Secondary | ICD-10-CM

## 2024-07-10 DIAGNOSIS — K760 Fatty (change of) liver, not elsewhere classified: Secondary | ICD-10-CM | POA: Diagnosis present

## 2024-07-10 DIAGNOSIS — Z1152 Encounter for screening for COVID-19: Secondary | ICD-10-CM

## 2024-07-10 DIAGNOSIS — Z8249 Family history of ischemic heart disease and other diseases of the circulatory system: Secondary | ICD-10-CM

## 2024-07-10 DIAGNOSIS — I493 Ventricular premature depolarization: Secondary | ICD-10-CM | POA: Diagnosis present

## 2024-07-10 DIAGNOSIS — I10 Essential (primary) hypertension: Secondary | ICD-10-CM | POA: Diagnosis not present

## 2024-07-10 DIAGNOSIS — A419 Sepsis, unspecified organism: Secondary | ICD-10-CM

## 2024-07-10 DIAGNOSIS — A4150 Gram-negative sepsis, unspecified: Secondary | ICD-10-CM | POA: Diagnosis not present

## 2024-07-10 DIAGNOSIS — I4510 Unspecified right bundle-branch block: Secondary | ICD-10-CM | POA: Diagnosis present

## 2024-07-10 DIAGNOSIS — Z8261 Family history of arthritis: Secondary | ICD-10-CM

## 2024-07-10 DIAGNOSIS — E878 Other disorders of electrolyte and fluid balance, not elsewhere classified: Secondary | ICD-10-CM | POA: Diagnosis present

## 2024-07-10 DIAGNOSIS — Z825 Family history of asthma and other chronic lower respiratory diseases: Secondary | ICD-10-CM

## 2024-07-10 DIAGNOSIS — E876 Hypokalemia: Secondary | ICD-10-CM | POA: Diagnosis present

## 2024-07-10 DIAGNOSIS — Z79899 Other long term (current) drug therapy: Secondary | ICD-10-CM

## 2024-07-10 LAB — URINALYSIS, ROUTINE W REFLEX MICROSCOPIC
Bilirubin Urine: NEGATIVE
Glucose, UA: NEGATIVE mg/dL
Ketones, ur: NEGATIVE mg/dL
Nitrite: POSITIVE — AB
Protein, ur: 30 mg/dL — AB
Specific Gravity, Urine: 1.032 — ABNORMAL HIGH (ref 1.005–1.030)
WBC, UA: >50 WBC/hpf (ref 0–5)
pH: 6 (ref 5.0–8.0)

## 2024-07-10 LAB — COMPREHENSIVE METABOLIC PANEL WITH GFR
ALT: 18 U/L (ref 0–44)
AST: 30 U/L (ref 15–41)
Albumin: 3.5 g/dL (ref 3.5–5.0)
Alkaline Phosphatase: 87 U/L (ref 38–126)
Anion gap: 14 (ref 5–15)
BUN: 15 mg/dL (ref 8–23)
CO2: 28 mmol/L (ref 22–32)
Calcium: 8.5 mg/dL — ABNORMAL LOW (ref 8.9–10.3)
Chloride: 90 mmol/L — ABNORMAL LOW (ref 98–111)
Creatinine, Ser: 0.92 mg/dL (ref 0.44–1.00)
GFR, Estimated: >60 mL/min (ref >60)
Glucose, Bld: 185 mg/dL — ABNORMAL HIGH (ref 70–99)
Potassium: 3.3 mmol/L — ABNORMAL LOW (ref 3.5–5.1)
Sodium: 132 mmol/L — ABNORMAL LOW (ref 135–145)
Total Bilirubin: 1.3 mg/dL — ABNORMAL HIGH (ref 0.0–1.2)
Total Protein: 7.5 g/dL (ref 6.5–8.1)

## 2024-07-10 LAB — TROPONIN I (HIGH SENSITIVITY)
Troponin I (High Sensitivity): 15 ng/L (ref <18)
Troponin I (High Sensitivity): 14 ng/L (ref <18)

## 2024-07-10 LAB — CBC WITH DIFFERENTIAL/PLATELET
Abs Immature Granulocytes: 0.08 K/uL — ABNORMAL HIGH (ref 0.00–0.07)
Basophils Absolute: 0.1 K/uL (ref 0.0–0.1)
Basophils Relative: 1 %
Eosinophils Absolute: 0 K/uL (ref 0.0–0.5)
Eosinophils Relative: 0 %
HCT: 45.2 % (ref 36.0–46.0)
Hemoglobin: 15.8 g/dL — ABNORMAL HIGH (ref 12.0–15.0)
Immature Granulocytes: 1 %
Lymphocytes Relative: 9 %
Lymphs Abs: 1.6 K/uL (ref 0.7–4.0)
MCH: 33.1 pg (ref 26.0–34.0)
MCHC: 35 g/dL (ref 30.0–36.0)
MCV: 94.6 fL (ref 80.0–100.0)
Monocytes Absolute: 1.3 K/uL — ABNORMAL HIGH (ref 0.1–1.0)
Monocytes Relative: 8 %
Neutro Abs: 14.4 K/uL — ABNORMAL HIGH (ref 1.7–7.7)
Neutrophils Relative %: 81 %
Platelets: 322 K/uL (ref 150–400)
RBC: 4.78 MIL/uL (ref 3.87–5.11)
RDW: 12.3 % (ref 11.5–15.5)
WBC: 17.5 K/uL — ABNORMAL HIGH (ref 4.0–10.5)
nRBC: 0 % (ref 0.0–0.2)

## 2024-07-10 LAB — RESP PANEL BY RT-PCR (RSV, FLU A&B, COVID)  RVPGX2
Influenza A by PCR: NEGATIVE
Influenza B by PCR: NEGATIVE
Resp Syncytial Virus by PCR: NEGATIVE
SARS Coronavirus 2 by RT PCR: NEGATIVE

## 2024-07-10 LAB — LACTIC ACID, PLASMA
Lactic Acid, Venous: 2 mmol/L (ref 0.5–1.9)
Lactic Acid, Venous: 1.4 mmol/L (ref 0.5–1.9)

## 2024-07-10 LAB — MAGNESIUM: Magnesium: 1.9 mg/dL (ref 1.7–2.4)

## 2024-07-10 MED ORDER — SODIUM CHLORIDE 0.9 % IV BOLUS
1000.0000 mL | Freq: Once | INTRAVENOUS | Status: AC
  Administered 2024-07-10 (×2): 1000 mL via INTRAVENOUS

## 2024-07-10 MED ORDER — IPRATROPIUM BROMIDE 0.03 % NA SOLN
2.0000 | Freq: Two times a day (BID) | NASAL | Status: DC
  Filled 2024-07-10 (×2): qty 30

## 2024-07-10 MED ORDER — AMLODIPINE BESYLATE 5 MG PO TABS
5.0000 mg | ORAL_TABLET | Freq: Every day | ORAL | Status: DC
  Administered 2024-07-11 – 2024-07-13 (×5): 5 mg via ORAL
  Filled 2024-07-10 (×3): qty 1

## 2024-07-10 MED ORDER — ASPIRIN 81 MG PO TBEC
81.0000 mg | DELAYED_RELEASE_TABLET | Freq: Every day | ORAL | Status: DC
  Administered 2024-07-10 – 2024-07-12 (×6): 81 mg via ORAL
  Filled 2024-07-10 (×3): qty 1

## 2024-07-10 MED ORDER — ANASTROZOLE 1 MG PO TABS
1.0000 mg | ORAL_TABLET | Freq: Every day | ORAL | Status: DC
  Administered 2024-07-11 – 2024-07-13 (×5): 1 mg via ORAL
  Filled 2024-07-10 (×4): qty 1

## 2024-07-10 MED ORDER — ACETAMINOPHEN 650 MG RE SUPP: 650.0000 mg | Freq: Four times a day (QID) | RECTAL | Status: DC | PRN

## 2024-07-10 MED ORDER — ONDANSETRON HCL 4 MG/2ML IJ SOLN: 4.0000 mg | Freq: Four times a day (QID) | INTRAMUSCULAR | Status: DC | PRN

## 2024-07-10 MED ORDER — ONDANSETRON HCL 4 MG PO TABS
4.0000 mg | ORAL_TABLET | Freq: Four times a day (QID) | ORAL | Status: DC | PRN
Start: 2024-07-10 — End: 2024-07-13

## 2024-07-10 MED ORDER — ONDANSETRON HCL 4 MG/2ML IJ SOLN
4.0000 mg | Freq: Once | INTRAMUSCULAR | Status: AC
  Administered 2024-07-10 (×2): 4 mg via INTRAVENOUS
  Filled 2024-07-10: qty 2

## 2024-07-10 MED ORDER — AZELASTINE HCL 0.1 % NA SOLN: 1.0000 | Freq: Two times a day (BID) | NASAL | Status: DC | PRN

## 2024-07-10 MED ORDER — ENOXAPARIN SODIUM 40 MG/0.4ML IJ SOSY
40.0000 mg | PREFILLED_SYRINGE | INTRAMUSCULAR | Status: DC
  Administered 2024-07-11 – 2024-07-13 (×5): 40 mg via SUBCUTANEOUS
  Filled 2024-07-10 (×3): qty 0.4

## 2024-07-10 MED ORDER — PANTOPRAZOLE SODIUM 40 MG PO TBEC
40.0000 mg | DELAYED_RELEASE_TABLET | Freq: Two times a day (BID) | ORAL | Status: DC
  Administered 2024-07-10 – 2024-07-13 (×11): 40 mg via ORAL
  Filled 2024-07-10 (×6): qty 1

## 2024-07-10 MED ORDER — ALBUTEROL SULFATE HFA 108 (90 BASE) MCG/ACT IN AERS: 2.0000 | INHALATION_SPRAY | Freq: Four times a day (QID) | RESPIRATORY_TRACT | Status: DC | PRN

## 2024-07-10 MED ORDER — MAGNESIUM HYDROXIDE 400 MG/5ML PO SUSP: 30.0000 mL | Freq: Every day | ORAL | Status: DC | PRN

## 2024-07-10 MED ORDER — IOHEXOL 300 MG/ML  SOLN
100.0000 mL | Freq: Once | INTRAMUSCULAR | Status: AC | PRN
  Administered 2024-07-10 (×2): 100 mL via INTRAVENOUS

## 2024-07-10 MED ORDER — FUROSEMIDE 40 MG PO TABS
80.0000 mg | ORAL_TABLET | Freq: Every day | ORAL | Status: DC
  Administered 2024-07-11 (×2): 80 mg via ORAL
  Filled 2024-07-10: qty 2

## 2024-07-10 MED ORDER — TRAZODONE HCL 50 MG PO TABS: 25.0000 mg | ORAL_TABLET | Freq: Every evening | ORAL | Status: DC | PRN

## 2024-07-10 MED ORDER — SODIUM CHLORIDE 0.9 % IV SOLN
1.0000 g | Freq: Once | INTRAVENOUS | Status: AC
  Administered 2024-07-10 (×2): 1 g via INTRAVENOUS
  Filled 2024-07-10: qty 10

## 2024-07-10 MED ORDER — HYDROXYCHLOROQUINE SULFATE 200 MG PO TABS
400.0000 mg | ORAL_TABLET | Freq: Every day | ORAL | Status: DC
  Administered 2024-07-11 – 2024-07-13 (×5): 400 mg via ORAL
  Filled 2024-07-10 (×4): qty 2

## 2024-07-10 MED ORDER — ZOLPIDEM TARTRATE 5 MG PO TABS
5.0000 mg | ORAL_TABLET | Freq: Every day | ORAL | Status: DC
  Administered 2024-07-10 – 2024-07-12 (×6): 5 mg via ORAL
  Filled 2024-07-10 (×3): qty 1

## 2024-07-10 MED ORDER — SODIUM CHLORIDE 0.9 % IV SOLN
2.0000 g | INTRAVENOUS | Status: DC
  Administered 2024-07-11 – 2024-07-12 (×4): 2 g via INTRAVENOUS
  Filled 2024-07-10 (×2): qty 20

## 2024-07-10 MED ORDER — LACTATED RINGERS IV SOLN
150.0000 mL/h | INTRAVENOUS | Status: DC
  Administered 2024-07-10 – 2024-07-11 (×6): 150 mL/h via INTRAVENOUS

## 2024-07-10 MED ORDER — ALBUTEROL SULFATE (2.5 MG/3ML) 0.083% IN NEBU: 2.5000 mg | INHALATION_SOLUTION | Freq: Four times a day (QID) | RESPIRATORY_TRACT | Status: DC | PRN

## 2024-07-10 MED ORDER — MORPHINE SULFATE (PF) 4 MG/ML IV SOLN
4.0000 mg | Freq: Once | INTRAVENOUS | Status: AC
  Administered 2024-07-10 (×2): 4 mg via INTRAVENOUS
  Filled 2024-07-10: qty 1

## 2024-07-10 MED ORDER — ACETAMINOPHEN 325 MG PO TABS
650.0000 mg | ORAL_TABLET | Freq: Four times a day (QID) | ORAL | Status: DC | PRN
Start: 2024-07-10 — End: 2024-07-13
  Administered 2024-07-11 (×2): 650 mg via ORAL
  Filled 2024-07-10: qty 2

## 2024-07-10 NOTE — ED Triage Notes (Signed)
 Pt comes for lack of app., vertigo, chills, sob, CP, and general body aches. Pt states she is hurt all over. Pt is A&Ox4. Pt is unstable on her feet since a week ago.

## 2024-07-10 NOTE — ED Notes (Signed)
 ED TO INPATIENT HANDOFF REPORT  ED Nurse Name and Phone #: Con [phone]  S Name/Age/Gender [name] No 78 y.o. female Room/Bed: APA10/APA10  Code Status   Code Status: Prior  Home/SNF/Other Home Patient oriented to: self, place, time, and situation Is this baseline? Yes   Triage Complete: Triage complete  Chief Complaint Sepsis due to gram-negative UTI (HCC) [A41.50, N39.0]  Triage Note Pt comes for lack of app., vertigo, chills, sob, CP, and general body aches. Pt states she is hurt all over. Pt is A&Ox4. Pt is unstable on her feet since a week ago.   Allergies Allergies  Allergen Reactions   Codeine     Neurological problems - hallucinations, spacey    Level of Care/Admitting Diagnosis ED Disposition     ED Disposition  Admit   Condition  --   Comment  Hospital Area: [facility] [[id number]]  Level of Care: Telemetry [5]  Covid Evaluation: Asymptomatic - no recent exposure (last 10 days) testing not required  Diagnosis: Sepsis due to gram-negative UTI [facility]) [[id number]]  Admitting Physician: LAWENCE MADISON LABOR [[id number]]  Attending Physician: LAWENCE MADISON LABOR [[id number]]  Certification:: I certify this patient will need inpatient services for at least 2 midnights  Expected Medical Readiness: 07/12/2024          B Medical/Surgery History Past Medical History:  Diagnosis Date   Cancer (HCC)    Left breast cancer   Diastolic congestive heart failure (HCC)    02/05/15  states no fluid in extremities, breathing same as has been   GERD (gastroesophageal reflux disease)    Hepatic steatosis    History of blood transfusion    Hx of cholecystectomy    Hypertension    Interstitial lung disease (HCC)    Osteoarthritis    Pulmonary fibrosis (HCC)    Raynaud disease    Rheumatoid arthritis(714.0)    Right BBB/left ant fasc block    Scleroderma (HCC)    Seasonal allergies    Shortness of breath dyspnea    states normal for her   Systemic sclerosis  [facility])    Past Surgical History:  Procedure Laterality Date   ABDOMINAL HYSTERECTOMY     BLADDER SUSPENSION     BREAST BIOPSY  11/03/2022   MM LT RADIOACTIVE SEED LOC MAMMO GUIDE 11/03/2022 [facility] MAMMOGRAPHY   BREAST LUMPECTOMY WITH RADIOACTIVE SEED LOCALIZATION Left 11/04/2022   Procedure: LEFT BREAST LUMPECTOMY WITH RADIOACTIVE SEED LOCALIZATION;  Surgeon: Vanderbilt Ned, MD;  Location: [facility] OR;  Service: General;  Laterality: Left;   CARPAL TUNNEL RELEASE Right 2007   EYE SURGERY Bilateral 2023   cataract w/ IOL   LAPAROSCOPIC CHOLECYSTECTOMY     LEFT HEART CATHETERIZATION WITH CORONARY/GRAFT ANGIOGRAM  03/15/2014   Procedure: LEFT HEART CATHETERIZATION WITH EL BILE;  Surgeon: Lonni JONETTA Cash, MD;  Location: [facility] CATH LAB;  Service: Cardiovascular;;   REVERSE SHOULDER ARTHROPLASTY Right 05/06/2023   Procedure: REVERSE SHOULDER ARTHROPLASTY;  Surgeon: Melita Drivers, MD;  Location: [facility] ORS;  Service: Orthopedics;  Laterality: Right;    TONSILLECTOMY     TOTAL KNEE ARTHROPLASTY Left 02/11/2015   Procedure: LEFT TOTAL KNEE ARTHROPLASTY, RIGHT KNEE CORTISONE INJECTION;  Surgeon: Dempsey Moan, MD;  Location: [facility] ORS;  Service: Orthopedics;  Laterality: Left;   TOTAL KNEE ARTHROPLASTY Right 10/05/2016   Procedure: RIGHT TOTAL KNEE ARTHROPLASTY;  Surgeon: Dempsey Moan, MD;  Location: [facility] ORS;  Service: Orthopedics;  Laterality: Right;   VESICOVAGINAL FISTULA CLOSURE W/ TAH  11/30/1985  A IV Location/Drains/Wounds Patient Lines/Drains/Airways Status     Active Line/Drains/Airways     Name Placement date Placement time Site Days   Peripheral IV 07/10/24 20 G Right Antecubital 07/10/24  1755  Antecubital  less than 1            Intake/Output Last 24 hours No intake or output data in the 24 hours ending 07/10/24 2237  Labs/Imaging Results for orders placed or performed during the hospital encounter of 07/10/24 (from the past 48 hours)  CBC with  Differential     Status: Abnormal   Collection Time: 07/10/24  1:35 PM  Result Value Ref Range   WBC 17.5 (H) 4.0 - 10.5 K/uL   RBC 4.78 3.87 - 5.11 MIL/uL   Hemoglobin 15.8 (H) 12.0 - 15.0 g/dL   HCT 54.7 63.9 - 53.9 %   MCV 94.6 80.0 - 100.0 fL   MCH 33.1 26.0 - 34.0 pg   MCHC 35.0 30.0 - 36.0 g/dL   RDW 87.6 88.4 - 84.4 %   Platelets 322 150 - 400 K/uL   nRBC 0.0 0.0 - 0.2 %   Neutrophils Relative % 81 %   Neutro Abs 14.4 (H) 1.7 - 7.7 K/uL   Lymphocytes Relative 9 %   Lymphs Abs 1.6 0.7 - 4.0 K/uL   Monocytes Relative 8 %   Monocytes Absolute 1.3 (H) 0.1 - 1.0 K/uL   Eosinophils Relative 0 %   Eosinophils Absolute 0.0 0.0 - 0.5 K/uL   Basophils Relative 1 %   Basophils Absolute 0.1 0.0 - 0.1 K/uL   Immature Granulocytes 1 %   Abs Immature Granulocytes 0.08 (H) 0.00 - 0.07 K/uL    Comment: Performed at [facility], [address], [place], [place] [address]  Comprehensive metabolic panel     Status: Abnormal   Collection Time: 07/10/24  1:35 PM  Result Value Ref Range   Sodium 132 (L) 135 - 145 mmol/L   Potassium 3.3 (L) 3.5 - 5.1 mmol/L   Chloride 90 (L) 98 - 111 mmol/L   CO2 28 22 - 32 mmol/L   Glucose, Bld 185 (H) 70 - 99 mg/dL    Comment: Glucose reference range applies only to samples taken after fasting for at least 8 hours.   BUN 15 8 - 23 mg/dL   Creatinine, Ser 9.07 0.44 - 1.00 mg/dL   Calcium  8.5 (L) 8.9 - 10.3 mg/dL   Total Protein 7.5 6.5 - 8.1 g/dL   Albumin 3.5 3.5 - 5.0 g/dL   AST 30 15 - 41 U/L   ALT 18 0 - 44 U/L   Alkaline Phosphatase 87 38 - 126 U/L   Total Bilirubin 1.3 (H) 0.0 - 1.2 mg/dL   GFR, Estimated >39 >39 mL/min    Comment: (NOTE) Calculated using the CKD-EPI Creatinine Equation (2021)    Anion gap 14 5 - 15    Comment: Performed at [facility], [address] [place], [place] [address]  Troponin I (High Sensitivity)     Status: None   Collection Time: 07/10/24  1:35 PM  Result Value Ref Range   Troponin I (High Sensitivity) 15 <18  ng/L    Comment: (NOTE) Elevated high sensitivity troponin I (hsTnI) values and significant  changes across serial measurements may suggest ACS but many other  chronic and acute conditions are known to elevate hsTnI results.  Refer to the Links section for chest pain algorithms and additional  guidance. Performed at [facility],  [address], [place], [place] [address]   Magnesium      Status: None   Collection Time: 07/10/24  1:35 PM  Result Value Ref Range   Magnesium  1.9 1.7 - 2.4 mg/dL    Comment: Performed at [facility], [address] [place], [place] [address]  Troponin I (High Sensitivity)     Status: None   Collection Time: 07/10/24  3:42 PM  Result Value Ref Range   Troponin I (High Sensitivity) 14 <18 ng/L    Comment: (NOTE) Elevated high sensitivity troponin I (hsTnI) values and significant  changes across serial measurements may suggest ACS but many other  chronic and acute conditions are known to elevate hsTnI results.  Refer to the Links section for chest pain algorithms and additional  guidance. Performed at [facility], [address], [place], [place] [address]   Resp panel by RT-PCR (RSV, Flu A&B, Covid) Anterior Nasal Swab     Status: None   Collection Time: 07/10/24  5:37 PM   Specimen: Anterior Nasal Swab  Result Value Ref Range   SARS Coronavirus 2 by RT PCR NEGATIVE NEGATIVE    Comment: (NOTE) SARS-CoV-2 target nucleic acids are NOT DETECTED.  The SARS-CoV-2 RNA is generally detectable in upper respiratory specimens during the acute phase of infection. The lowest concentration of SARS-CoV-2 viral copies this assay can detect is 138 copies/mL. A negative result does not preclude SARS-Cov-2 infection and should not be used as the sole basis for treatment or other patient management decisions. A negative result may occur with  improper specimen collection/handling, submission of specimen other than nasopharyngeal swab, presence of viral mutation(s)  within the areas targeted by this assay, and inadequate number of viral copies(<138 copies/mL). A negative result must be combined with clinical observations, patient history, and epidemiological information. The expected result is Negative.  Fact Sheet for Patients:  BloggerCourse.com  Fact Sheet for Healthcare Providers:  SeriousBroker.it  This test is no t yet approved or cleared by the [place]  FDA and  has been authorized for detection and/or diagnosis of SARS-CoV-2 by FDA under an Emergency Use Authorization (EUA). This EUA will remain  in effect (meaning this test can be used) for the duration of the COVID-19 declaration under Section 564(b)(1) of the Act, 21 U.S.C.section 360bbb-3(b)(1), unless the authorization is terminated  or revoked sooner.       Influenza A by PCR NEGATIVE NEGATIVE   Influenza B by PCR NEGATIVE NEGATIVE    Comment: (NOTE) The Xpert Xpress SARS-CoV-2/FLU/RSV plus assay is intended as an aid in the diagnosis of influenza from Nasopharyngeal swab specimens and should not be used as a sole basis for treatment. Nasal washings and aspirates are unacceptable for Xpert Xpress SARS-CoV-2/FLU/RSV testing.  Fact Sheet for Patients: BloggerCourse.com  Fact Sheet for Healthcare Providers: SeriousBroker.it  This test is not yet approved or cleared by the [place]  FDA and has been authorized for detection and/or diagnosis of SARS-CoV-2 by FDA under an Emergency Use Authorization (EUA). This EUA will remain in effect (meaning this test can be used) for the duration of the COVID-19 declaration under Section 564(b)(1) of the Act, 21 U.S.C. section 360bbb-3(b)(1), unless the authorization is terminated or revoked.     Resp Syncytial Virus by PCR NEGATIVE NEGATIVE    Comment: (NOTE) Fact Sheet for  Patients: BloggerCourse.com  Fact Sheet for Healthcare Providers: SeriousBroker.it  This test is not yet approved or cleared by the [place]  FDA and has been authorized for detection and/or  diagnosis of SARS-CoV-2 by FDA under an Emergency Use Authorization (EUA). This EUA will remain in effect (meaning this test can be used) for the duration of the COVID-19 declaration under Section 564(b)(1) of the Act, 21 U.S.C. section 360bbb-3(b)(1), unless the authorization is terminated or revoked.  Performed at [facility], [address] [place], [place] [address]   Lactic acid, plasma     Status: Abnormal   Collection Time: 07/10/24  5:37 PM  Result Value Ref Range   Lactic Acid, Venous 2.0 (HH) 0.5 - 1.9 mmol/L    Comment: CRITICAL RESULT CALLED TO, READ BACK BY AND VERIFIED WITH TURNER,C ON 07/10/24 AT 1928 BY LOY,C Performed at [facility], [address], [place], [place] [address]   Urinalysis, Routine w reflex microscopic -Urine, Clean Catch     Status: Abnormal   Collection Time: 07/10/24  7:28 PM  Result Value Ref Range   Color, Urine YELLOW YELLOW   APPearance HAZY (A) CLEAR   Specific Gravity, Urine 1.032 (H) 1.005 - 1.030   pH 6.0 5.0 - 8.0   Glucose, UA NEGATIVE NEGATIVE mg/dL   Hgb urine dipstick MODERATE (A) NEGATIVE   Bilirubin Urine NEGATIVE NEGATIVE   Ketones, ur NEGATIVE NEGATIVE mg/dL   Protein, ur 30 (A) NEGATIVE mg/dL   Nitrite POSITIVE (A) NEGATIVE   Leukocytes,Ua LARGE (A) NEGATIVE   RBC / HPF 6-10 0 - 5 RBC/hpf   WBC, UA >50 0 - 5 WBC/hpf   Bacteria, UA RARE (A) NONE SEEN   Squamous Epithelial / HPF 0-5 0 - 5 /HPF   WBC Clumps PRESENT    Mucus PRESENT    Hyaline Casts, UA PRESENT     Comment: Performed at [facility], [address] [place], [place] [address]  Lactic acid, plasma     Status: None   Collection Time: 07/10/24  8:46 PM  Result Value Ref Range   Lactic Acid, Venous 1.4 0.5 - 1.9 mmol/L     Comment: Performed at [facility], [address] [place], [place] [address]   CT CHEST ABDOMEN PELVIS W CONTRAST Result Date: 07/10/2024 CLINICAL DATA:  Sepsis. EXAM: CT CHEST, ABDOMEN, AND PELVIS WITH CONTRAST TECHNIQUE: Multidetector CT imaging of the chest, abdomen and pelvis was performed following the standard protocol during bolus administration of intravenous contrast. RADIATION DOSE REDUCTION: This exam was performed according to the departmental dose-optimization program which includes automated exposure control, adjustment of the mA and/or kV according to patient size and/or use of iterative reconstruction technique. CONTRAST:  OMNIPAQUE  IOHEXOL  300 MG/ML  SOLN COMPARISON:  CT chest 05/03/2024.  CT abdomen and pelvis 02/05/2021. FINDINGS: CT CHEST FINDINGS Cardiovascular: Heart is mildly enlarged. Aorta is normal in size. There are atherosclerotic calcifications of the aorta. There is no pericardial effusion. Mediastinum/Nodes: There are no enlarged mediastinal or hilar lymph nodes identified. Visualized esophagus is within normal limits. There is a heterogeneous 2.5 cm right thyroid nodule, grossly unchanged. Lungs/Pleura: Fibrotic changes are again seen in both lungs most prominent in the right middle lobe, lingula and lung bases. There is been no significant interval change. There is no new focal lung infiltrate, pleural effusion or pneumothorax. Musculoskeletal: There surgical clips and scarring in the left breast, unchanged. No acute bony abnormality identified. Degenerative changes affect the spine. Right shoulder arthroplasty is present. CT ABDOMEN PELVIS FINDINGS Hepatobiliary: There is diffuse fatty infiltration of the liver. There is enlargement of the caudate lobe, unchanged. No focal liver lesion identified. Gallbladder is surgically absent. There  is no biliary ductal dilatation. Pancreas: Unremarkable. No pancreatic ductal dilatation or surrounding inflammatory changes. Spleen:  Normal in size without focal abnormality. Adrenals/Urinary Tract: There is a 2 mm calculus in each kidney. There is no hydronephrosis or perinephric fat stranding. The adrenal glands and bladder are within normal limits. Stomach/Bowel: Stomach is within normal limits. Appendix appears normal. No evidence of bowel wall thickening, distention, or inflammatory changes. There is scattered sigmoid colon diverticula. Vascular/Lymphatic: Aortic atherosclerosis. No enlarged abdominal or pelvic lymph nodes. Reproductive: Status post hysterectomy. No adnexal masses. Other: There is a small fat containing umbilical hernia. No ascites. Musculoskeletal: Bones are osteopenic. There is mild chronic appearing compression deformity of L4. IMPRESSION: 1. No acute localizing process in the chest, abdomen or pelvis. 2. Stable fibrotic changes in the lungs. 3. Stable right thyroid nodule. Thyroid ultrasound recommended if not performed previously. 4. Fatty infiltration of the liver. 5. Nonobstructing bilateral renal calculi. 6. Sigmoid colon diverticulosis. Aortic Atherosclerosis (ICD10-I70.0). Electronically Signed   By: Greig Pique M.D.   On: 07/10/2024 21:10    Pending Labs Unresulted Labs (From admission, onward)     Start     Ordered   07/10/24 2130  Culture, blood (single)  (Undifferentiated -> Now sepsis confirmed (treatment and sepsis specific nursing orders))  ONCE - STAT,   STAT        07/10/24 2130            Vitals/Pain Today's Vitals   07/10/24 2115 07/10/24 2128 07/10/24 2145 07/10/24 2221  BP: (!) 106/57  (!) 128/51   Pulse: 86  91   Resp: (!) 25  (!) 25   Temp:    98.5 F (36.9 C)  TempSrc:    Oral  SpO2: 91%  98%   Weight:      Height:      PainSc:  0-No pain      Isolation Precautions No active isolations  Medications Medications  sodium chloride  0.9 % bolus 1,000 mL (0 mLs Intravenous Stopped 07/10/24 2038)  ondansetron  (ZOFRAN ) injection 4 mg (4 mg Intravenous Given 07/10/24 1755)   iohexol  (OMNIPAQUE ) 300 MG/ML solution 100 mL (100 mLs Intravenous Contrast Given 07/10/24 1906)  cefTRIAXone  (ROCEPHIN ) 1 g in sodium chloride  0.9 % 100 mL IVPB (0 g Intravenous Stopped 07/10/24 2128)  morphine  (PF) 4 MG/ML injection 4 mg (4 mg Intravenous Given 07/10/24 2049)  sodium chloride  0.9 % bolus 1,000 mL (1,000 mLs Intravenous New Bag/Given 07/10/24 2048)  cefTRIAXone  (ROCEPHIN ) 1 g in sodium chloride  0.9 % 100 mL IVPB (1 g Intravenous New Bag/Given 07/10/24 2158)    Mobility walks with person assist     Focused Assessments Cardiac Assessment Handoff:  Cardiac Rhythm: Normal sinus rhythm No results found for: CKTOTAL, CKMB, CKMBINDEX, TROPONINI No results found for: DDIMER Does the Patient currently have chest pain? No   ,    R Recommendations: See Admitting Provider Note  Report given to:   Additional Notes:  reported lower abdominal pain that has completely resolved following IV morphine . Has been able to transfer self from stretcher to bedside toilet. Reports dizziness which has improved with ongoing IV. Currently Infusing second 1 L NS and second 1 g of Rocephin .  Complete set of VS updated.

## 2024-07-10 NOTE — ED Notes (Signed)
 Lab tech at bedside collecting ordered blood cultures. Blood cultures being collected after IV ABX

## 2024-07-10 NOTE — Plan of Care (Signed)

## 2024-07-10 NOTE — H&P (Signed)
    PATIENT NAME: [name]    MR#:  [id number]  DATE OF BIRTH:  Jan 11, 1946  DATE OF ADMISSION:  07/10/2024  PRIMARY CARE PHYSICIAN: Sheryle Carwin, MD   Patient is coming from: Home  REQUESTING/REFERRING PHYSICIAN: Dean Clarity, MD  CHIEF COMPLAINT:   Chief Complaint  Patient presents with   Generalized Body Aches    HISTORY OF PRESENT ILLNESS:  [name] is a 78 y.o. female with medical history significant for GERD, essential hypertension, osteoarthritis, pulmonary fibrosis, Raynaud's disease, rheumatoid arthritis, scleroderma, seasonal allergies and diastolic CHF, who presented to the emergency room with acute onset of significant anorexia over the last week with lower abdominal discomfort and back pain.  She denies frequency and urgency or dysuria but admits to bilateral flank pain.  No cough or wheezing or dyspnea.  No chest pain or palpitations.  She admits to fever and chills.  She has been having vaginal itching in the ER.  No bleeding diathesis.  ED Course: When she came to the ER, vital signs were within normal.  Labs revealed mild hyponatremia 132 and hypochloremia of 90 with hypokalemia of 3.3.  Blood glucose was 185 and calcium  8.5.  Total bili was 1.3.  Lactic acid was 2 mm 1.4.  CBC showed leukocytosis 17.5 with neutrophilia as well as hemoconcentration.  Respiratory panel came back negative.  UA was positive for UTI.  Blood and urine culture was sent. EKG as reviewed by me :  Sinus rhythm with occasional PVCs and PACs, right bundle branch block and T wave inversion anterolaterally.  EKG showed Imaging: Abdominal pelvic CT scan with contrast revealed the following: 1. No acute localizing process in the chest, abdomen or pelvis. 2. Stable fibrotic changes in the lungs. 3. Stable right thyroid nodule. Thyroid ultrasound recommended if not performed previously. 4. Fatty infiltration of the liver. 5. Nonobstructing bilateral renal  calculi. 6. Sigmoid colon diverticulosis. 7.  Aortic atherosclerosis.  The patient was given 2 L bolus of IV normal saline, 4 mg of IV morphine  sulfate and 4 mg of IV Zofran  as well as 1 g of IV Rocephin .  She will be admitted to a medical telemetry bed for further evaluation and management. PAST MEDICAL HISTORY:   Past Medical History:  Diagnosis Date   Cancer [facility])    Left breast cancer   Diastolic congestive heart failure (HCC)    02/05/15  states no fluid in extremities, breathing same as has been   GERD (gastroesophageal reflux disease)    Hepatic steatosis    History of blood transfusion    Hx of cholecystectomy    Hypertension    Interstitial lung disease (HCC)    Osteoarthritis    Pulmonary fibrosis (HCC)    Raynaud disease    Rheumatoid arthritis(714.0)    Right BBB/left ant fasc block    Scleroderma (HCC)    Seasonal allergies    Shortness of breath dyspnea    states normal for her   Systemic sclerosis (HCC)     PAST SURGICAL HISTORY:   Past Surgical History:  Procedure Laterality Date   ABDOMINAL HYSTERECTOMY     BLADDER SUSPENSION     BREAST BIOPSY  11/03/2022   MM LT RADIOACTIVE SEED LOC MAMMO GUIDE 11/03/2022 [facility] MAMMOGRAPHY   BREAST LUMPECTOMY WITH RADIOACTIVE SEED LOCALIZATION Left 11/04/2022   Procedure: LEFT BREAST LUMPECTOMY WITH RADIOACTIVE SEED LOCALIZATION;  Surgeon: Vanderbilt Ned, MD;  Location: [facility] OR;  Service: General;  Laterality:  Left;   CARPAL TUNNEL RELEASE Right 2007   EYE SURGERY Bilateral 2023   cataract w/ IOL   LAPAROSCOPIC CHOLECYSTECTOMY     LEFT HEART CATHETERIZATION WITH CORONARY/GRAFT ANGIOGRAM  03/15/2014   Procedure: LEFT HEART CATHETERIZATION WITH EL BILE;  Surgeon: Lonni JONETTA Cash, MD;  Location: [facility] CATH LAB;  Service: Cardiovascular;;   REVERSE SHOULDER ARTHROPLASTY Right 05/06/2023   Procedure: REVERSE SHOULDER ARTHROPLASTY;  Surgeon: Melita Drivers, MD;  Location: [facility] ORS;  Service: Orthopedics;   Laterality: Right;    TONSILLECTOMY     TOTAL KNEE ARTHROPLASTY Left 02/11/2015   Procedure: LEFT TOTAL KNEE ARTHROPLASTY, RIGHT KNEE CORTISONE INJECTION;  Surgeon: Dempsey Moan, MD;  Location: [facility] ORS;  Service: Orthopedics;  Laterality: Left;   TOTAL KNEE ARTHROPLASTY Right 10/05/2016   Procedure: RIGHT TOTAL KNEE ARTHROPLASTY;  Surgeon: Dempsey Moan, MD;  Location: [facility] ORS;  Service: Orthopedics;  Laterality: Right;   VESICOVAGINAL FISTULA CLOSURE W/ TAH  11/30/1985    SOCIAL HISTORY:   Social History   Tobacco Use   Smoking status: Former    Current packs/day: 0.00    Average packs/day: 1 pack/day for 20.0 years (20.0 ttl pk-yrs)    Types: Cigarettes    Start date: 11/30/1968    Quit date: 11/30/1988    Years since quitting: 35.6   Smokeless tobacco: Never  Substance Use Topics   Alcohol use: No    FAMILY HISTORY:   Family History  Problem Relation Age of Onset   Stroke Mother    Heart disease Mother        CABG at age 53   Heart attack Father        Died of MI at age 33   Emphysema Father    Dementia Brother    Osteoarthritis Other    Celiac disease Other     DRUG ALLERGIES:   Allergies  Allergen Reactions   Codeine     Neurological problems - hallucinations, spacey    REVIEW OF SYSTEMS:   ROS As per history of present illness. All pertinent systems were reviewed above. Constitutional, HEENT, cardiovascular, respiratory, GI, GU, musculoskeletal, neuro, psychiatric, endocrine, integumentary and hematologic systems were reviewed and are otherwise negative/unremarkable except for positive findings mentioned above in the HPI.   MEDICATIONS AT HOME:   Prior to Admission medications   Medication Sig Start Date End Date Taking? Authorizing Provider  acetaminophen  (TYLENOL ) 500 MG tablet Take 1,000 mg by mouth every 6 (six) hours as needed for moderate pain.   Yes [provider]  albuterol  (VENTOLIN  HFA) 108 (90 Base) MCG/ACT inhaler INHALE 2  PUFFS INTO THE LUNGS EVERY 6 (SIX) HOURS AS NEEDED FOR WHEEZING. 03/21/21  Yes Geronimo Amel, MD  amLODipine  (NORVASC ) 5 MG tablet Take 5 mg by mouth in the morning.   Yes [provider]  anastrozole  (ARIMIDEX ) 1 MG tablet Take 1 tablet (1 mg total) by mouth daily. 04/14/24  Yes Odean Potts, MD  aspirin  EC 81 MG tablet Take 81 mg by mouth at bedtime.   Yes [provider]  azelastine  (ASTELIN ) 0.1 % nasal spray Place 1 spray into both nostrils 2 (two) times daily as needed for rhinitis. Use in each nostril as directed   Yes [provider]  furosemide  (LASIX ) 40 MG tablet TAKE 2 TABLETS EVERY DAY Patient taking differently: Take 80 mg by mouth daily. 04/12/24  Yes Pietro Redell RAMAN, MD  hydroxychloroquine  (PLAQUENIL ) 200 MG tablet Take 400 mg by mouth in the morning.  Yes [provider]  ipratropium (ATROVENT ) 0.03 % nasal spray Place 2 sprays into both nostrils every 12 (twelve) hours. 03/10/24  Yes Hope Almarie ORN, NP  nortriptyline (PAMELOR) 10 MG capsule Take 30 mg by mouth at bedtime.   Yes [provider]  pantoprazole  (PROTONIX ) 40 MG tablet Take 40 mg by mouth 2 (two) times daily.   Yes [provider]  predniSONE  (DELTASONE ) 5 MG tablet Take 5 mg by mouth in the morning. 03/31/22  Yes [provider]  zolpidem  (AMBIEN ) 10 MG tablet Take 10 mg by mouth at bedtime.   Yes [provider]      VITAL SIGNS:  Blood pressure (!) 159/56, pulse 93, temperature (!) 100.6 F (38.1 C), temperature source Oral, resp. rate 20, height 5' 3 (1.6 m), weight 77.1 kg, SpO2 100%.  PHYSICAL EXAMINATION:  Physical Exam  GENERAL:  78 y.o.-year-old Caucasian female patient lying in the bed with no acute distress.  EYES: Pupils equal, round, reactive to light and accommodation. No scleral icterus. Extraocular muscles intact.  HEENT: Head atraumatic, normocephalic. Oropharynx and nasopharynx clear.  NECK:  Supple, no jugular  venous distention. No thyroid enlargement, no tenderness.  LUNGS: Normal breath sounds bilaterally, no wheezing, rales,rhonchi or crepitation. No use of accessory muscles of respiration.  CARDIOVASCULAR: Regular rate and rhythm, S1, S2 normal. No murmurs, rubs, or gallops.  ABDOMEN: Soft, nondistended, with lower abdominal tenderness without rebound tenderness guarding or rigidity.  Bowel sounds present. No organomegaly or mass.  EXTREMITIES: No pedal edema, cyanosis, or clubbing.  NEUROLOGIC: Cranial nerves II through XII are intact. Muscle strength 5/5 in all extremities. Sensation intact. Gait not checked.  PSYCHIATRIC: The patient is alert and oriented x 3.  Normal affect and good eye contact. SKIN: No obvious rash, lesion, or ulcer.   LABORATORY PANEL:   CBC Recent Labs  Lab 07/10/24 1335  WBC 17.5*  HGB 15.8*  HCT 45.2  PLT 322   ------------------------------------------------------------------------------------------------------------------  Chemistries  Recent Labs  Lab 07/10/24 1335  NA 132*  K 3.3*  CL 90*  CO2 28  GLUCOSE 185*  BUN 15  CREATININE 0.92  CALCIUM  8.5*  MG 1.9  AST 30  ALT 18  ALKPHOS 87  BILITOT 1.3*   ------------------------------------------------------------------------------------------------------------------  Cardiac Enzymes No results for input(s): TROPONINI in the last 168 hours. ------------------------------------------------------------------------------------------------------------------  RADIOLOGY:  CT CHEST ABDOMEN PELVIS W CONTRAST Result Date: 07/10/2024 CLINICAL DATA:  Sepsis. EXAM: CT CHEST, ABDOMEN, AND PELVIS WITH CONTRAST TECHNIQUE: Multidetector CT imaging of the chest, abdomen and pelvis was performed following the standard protocol during bolus administration of intravenous contrast. RADIATION DOSE REDUCTION: This exam was performed according to the departmental dose-optimization program which includes automated  exposure control, adjustment of the mA and/or kV according to patient size and/or use of iterative reconstruction technique. CONTRAST:  OMNIPAQUE  IOHEXOL  300 MG/ML  SOLN COMPARISON:  CT chest 05/03/2024.  CT abdomen and pelvis 02/05/2021. FINDINGS: CT CHEST FINDINGS Cardiovascular: Heart is mildly enlarged. Aorta is normal in size. There are atherosclerotic calcifications of the aorta. There is no pericardial effusion. Mediastinum/Nodes: There are no enlarged mediastinal or hilar lymph nodes identified. Visualized esophagus is within normal limits. There is a heterogeneous 2.5 cm right thyroid nodule, grossly unchanged. Lungs/Pleura: Fibrotic changes are again seen in both lungs most prominent in the right middle lobe, lingula and lung bases. There is been no significant interval change. There is no new focal lung infiltrate, pleural effusion or pneumothorax. Musculoskeletal: There surgical clips  and scarring in the left breast, unchanged. No acute bony abnormality identified. Degenerative changes affect the spine. Right shoulder arthroplasty is present. CT ABDOMEN PELVIS FINDINGS Hepatobiliary: There is diffuse fatty infiltration of the liver. There is enlargement of the caudate lobe, unchanged. No focal liver lesion identified. Gallbladder is surgically absent. There is no biliary ductal dilatation. Pancreas: Unremarkable. No pancreatic ductal dilatation or surrounding inflammatory changes. Spleen: Normal in size without focal abnormality. Adrenals/Urinary Tract: There is a 2 mm calculus in each kidney. There is no hydronephrosis or perinephric fat stranding. The adrenal glands and bladder are within normal limits. Stomach/Bowel: Stomach is within normal limits. Appendix appears normal. No evidence of bowel wall thickening, distention, or inflammatory changes. There is scattered sigmoid colon diverticula. Vascular/Lymphatic: Aortic atherosclerosis. No enlarged abdominal or pelvic lymph nodes. Reproductive:  Status post hysterectomy. No adnexal masses. Other: There is a small fat containing umbilical hernia. No ascites. Musculoskeletal: Bones are osteopenic. There is mild chronic appearing compression deformity of L4. IMPRESSION: 1. No acute localizing process in the chest, abdomen or pelvis. 2. Stable fibrotic changes in the lungs. 3. Stable right thyroid nodule. Thyroid ultrasound recommended if not performed previously. 4. Fatty infiltration of the liver. 5. Nonobstructing bilateral renal calculi. 6. Sigmoid colon diverticulosis. Aortic Atherosclerosis (ICD10-I70.0). Electronically Signed   By: Greig Pique M.D.   On: 07/10/2024 21:10      IMPRESSION AND PLAN:  Assessment and Plan: * Sepsis due to gram-negative UTI [facility]) - The patient will be admitted to a medical telemetry bed. - Will continue antibiotic therapy with IV Rocephin . - Will continue hydration with IV normal saline. - Sepsis manifested by significant leukocytosis and heart rate that was up to 93 and respiratory rate of 25. - Will follow blood and urine cultures.  Rheumatoid arthritis (HCC) - Will continue Plaquenil .  GERD without esophagitis - Will continue PPI therapy.  History of breast cancer - Will continue Arimidex .  Essential hypertension - Will continue antihypertensive therapy.   DVT prophylaxis: Lovenox . Advanced Care Planning:  Code Status: The is DNR only. Family Communication:  The plan of care was discussed in details with the patient (and family). I answered all questions. The patient agreed to proceed with the above mentioned plan. Further management will depend upon hospital course. Disposition Plan: Back to previous home environment Consults called: none. All the records are reviewed and case discussed with ED provider.  Status is: Inpatient  At the time of the admission, it appears that the appropriate admission status for this patient is inpatient.  This is judged to be reasonable and necessary in  order to provide the required intensity of service to ensure the patient's safety given the presenting symptoms, physical exam findings and initial radiographic and laboratory data in the context of comorbid conditions.  The patient requires inpatient status due to high intensity of service, high risk of further deterioration and high frequency of surveillance required.  I certify that at the time of admission, it is my clinical judgment that the patient will require inpatient hospital care extending more than 2 midnights.                            Dispo: The patient is from: Home              Anticipated d/c is to: Home              Patient currently is not medically stable to d/c.  Difficult to place patient: No  [name] M.D on 07/11/2024 at 3:23 AM  [facility]   From 7 PM-7 AM, contact night-coverage [email or web address]  CC: Primary care physician; Sheryle Carwin, MD

## 2024-07-10 NOTE — ED Notes (Signed)
 Pt was laying back sts she took a sip of her drink. Caused a coughing spell. Was sat up by staff. Is currently 92-93% on room air. Updated provided to accepting floor staff RN

## 2024-07-10 NOTE — Sepsis Progress Note (Signed)
 Following for sepsis monitoring ?

## 2024-07-10 NOTE — ED Provider Notes (Signed)
 [place] EMERGENCY DEPARTMENT AT [facility] Provider Note   CSN: [id number] Arrival date & time: 07/10/24  1246     Patient presents with: Generalized Body Aches   [name] is a 78 y.o. female.   Pt is a 78 yo female with pmhx significant for HTN, systemic sclerosis, RA, pulmonary fibrosis, CHF, GERD, scleroderma, and left breast cancer s/p lumpectomy.  Pt said she's had several days of chills, aches, sob, and cp.  She has not been eating anything b/c she's felt nauseous.  She has drank very little.  She called her pcp today and was told to come to the ED.       Prior to Admission medications   Medication Sig Start Date End Date Taking? Authorizing Provider  acetaminophen  (TYLENOL ) 500 MG tablet Take 1,000 mg by mouth every 6 (six) hours as needed for moderate pain.   Yes [provider]  albuterol  (VENTOLIN  HFA) 108 (90 Base) MCG/ACT inhaler INHALE 2 PUFFS INTO THE LUNGS EVERY 6 (SIX) HOURS AS NEEDED FOR WHEEZING. 03/21/21  Yes Geronimo Amel, MD  amLODipine  (NORVASC ) 5 MG tablet Take 5 mg by mouth in the morning.   Yes [provider]  anastrozole  (ARIMIDEX ) 1 MG tablet Take 1 tablet (1 mg total) by mouth daily. 04/14/24  Yes Odean Potts, MD  aspirin  EC 81 MG tablet Take 81 mg by mouth at bedtime.   Yes [provider]  azelastine  (ASTELIN ) 0.1 % nasal spray Place 1 spray into both nostrils 2 (two) times daily as needed for rhinitis. Use in each nostril as directed   Yes [provider]  furosemide  (LASIX ) 40 MG tablet TAKE 2 TABLETS EVERY DAY Patient taking differently: Take 80 mg by mouth daily. 04/12/24  Yes Pietro Redell RAMAN, MD  hydroxychloroquine  (PLAQUENIL ) 200 MG tablet Take 400 mg by mouth in the morning.   Yes [provider]  ipratropium (ATROVENT ) 0.03 % nasal spray Place 2 sprays into both nostrils every 12 (twelve) hours. 03/10/24  Yes Hope Almarie ORN, NP  nortriptyline (PAMELOR) 10 MG capsule Take  30 mg by mouth at bedtime.   Yes [provider]  pantoprazole  (PROTONIX ) 40 MG tablet Take 40 mg by mouth 2 (two) times daily.   Yes [provider]  predniSONE  (DELTASONE ) 5 MG tablet Take 5 mg by mouth in the morning. 03/31/22  Yes [provider]  zolpidem  (AMBIEN ) 10 MG tablet Take 10 mg by mouth at bedtime.   Yes [provider]    Allergies: Codeine    Review of Systems  Constitutional:  Positive for appetite change, chills and fatigue.  Respiratory:  Positive for shortness of breath.   Neurological:  Positive for weakness.  All other systems reviewed and are negative.   Updated Vital Signs BP (!) 128/51   Pulse 91   Temp 98.3 F (36.8 C) (Oral)   Resp (!) 25   Ht 5' 3 (1.6 m)   Wt 77.1 kg   SpO2 98%   BMI 30.11 kg/m   Physical Exam Vitals and nursing note reviewed.  Constitutional:      Appearance: Normal appearance.  HENT:     Head: Normocephalic and atraumatic.     Right Ear: External ear normal.     Left Ear: External ear normal.     Nose: Nose normal.     Mouth/Throat:     Mouth: Mucous membranes are dry.  Eyes:     Extraocular Movements: Extraocular movements  intact.     Conjunctiva/sclera: Conjunctivae normal.     Pupils: Pupils are equal, round, and reactive to light.  Cardiovascular:     Rate and Rhythm: Normal rate and regular rhythm.     Pulses: Normal pulses.     Heart sounds: Normal heart sounds.  Pulmonary:     Effort: Pulmonary effort is normal.     Breath sounds: Normal breath sounds.  Abdominal:     General: Abdomen is flat. Bowel sounds are normal.     Palpations: Abdomen is soft.  Musculoskeletal:        General: Normal range of motion.     Cervical back: Normal range of motion and neck supple.  Skin:    General: Skin is warm.     Capillary Refill: Capillary refill takes less than 2 seconds.  Neurological:     General: No focal deficit present.     Mental Status: She is alert and oriented to  person, place, and time.  Psychiatric:        Mood and Affect: Mood normal.        Behavior: Behavior normal.     (all labs ordered are listed, but only abnormal results are displayed) Labs Reviewed  CBC WITH DIFFERENTIAL/PLATELET - Abnormal; Notable for the following components:      Result Value   WBC 17.5 (*)    Hemoglobin 15.8 (*)    Neutro Abs 14.4 (*)    Monocytes Absolute 1.3 (*)    Abs Immature Granulocytes 0.08 (*)    All other components within normal limits  COMPREHENSIVE METABOLIC PANEL WITH GFR - Abnormal; Notable for the following components:   Sodium 132 (*)    Potassium 3.3 (*)    Chloride 90 (*)    Glucose, Bld 185 (*)    Calcium  8.5 (*)    Total Bilirubin 1.3 (*)    All other components within normal limits  URINALYSIS, ROUTINE W REFLEX MICROSCOPIC - Abnormal; Notable for the following components:   APPearance HAZY (*)    Specific Gravity, Urine 1.032 (*)    Hgb urine dipstick MODERATE (*)    Protein, ur 30 (*)    Nitrite POSITIVE (*)    Leukocytes,Ua LARGE (*)    Bacteria, UA RARE (*)    All other components within normal limits  LACTIC ACID, PLASMA - Abnormal; Notable for the following components:   Lactic Acid, Venous 2.0 (*)    All other components within normal limits  RESP PANEL BY RT-PCR (RSV, FLU A&B, COVID)  RVPGX2  CULTURE, BLOOD (SINGLE)  MAGNESIUM   LACTIC ACID, PLASMA  TROPONIN I (HIGH SENSITIVITY)  TROPONIN I (HIGH SENSITIVITY)    EKG: EKG Interpretation Date/Time:  Monday July 10 2024 13:11:19 EDT Ventricular Rate:  99 PR Interval:  158 QRS Duration:  148 QT Interval:  398 QTC Calculation: 510 R Axis:   125  Text Interpretation: Sinus rhythm with occasional Premature ventricular complexes and Premature atrial complexes Right bundle branch block T wave abnormality, consider inferolateral ischemia Abnormal ECG When compared with ECG of 17-May-2024 14:10, Premature ventricular complexes are now Present Premature atrial complexes  are now Present No significant change since last tracing Confirmed by Dean Clarity [phone]) on 07/10/2024 6:19:03 PM  Radiology: CT CHEST ABDOMEN PELVIS W CONTRAST Result Date: 07/10/2024 CLINICAL DATA:  Sepsis. EXAM: CT CHEST, ABDOMEN, AND PELVIS WITH CONTRAST TECHNIQUE: Multidetector CT imaging of the chest, abdomen and pelvis was performed following the standard protocol during bolus administration of intravenous  contrast. RADIATION DOSE REDUCTION: This exam was performed according to the departmental dose-optimization program which includes automated exposure control, adjustment of the mA and/or kV according to patient size and/or use of iterative reconstruction technique. CONTRAST:  OMNIPAQUE  IOHEXOL  300 MG/ML  SOLN COMPARISON:  CT chest 05/03/2024.  CT abdomen and pelvis 02/05/2021. FINDINGS: CT CHEST FINDINGS Cardiovascular: Heart is mildly enlarged. Aorta is normal in size. There are atherosclerotic calcifications of the aorta. There is no pericardial effusion. Mediastinum/Nodes: There are no enlarged mediastinal or hilar lymph nodes identified. Visualized esophagus is within normal limits. There is a heterogeneous 2.5 cm right thyroid nodule, grossly unchanged. Lungs/Pleura: Fibrotic changes are again seen in both lungs most prominent in the right middle lobe, lingula and lung bases. There is been no significant interval change. There is no new focal lung infiltrate, pleural effusion or pneumothorax. Musculoskeletal: There surgical clips and scarring in the left breast, unchanged. No acute bony abnormality identified. Degenerative changes affect the spine. Right shoulder arthroplasty is present. CT ABDOMEN PELVIS FINDINGS Hepatobiliary: There is diffuse fatty infiltration of the liver. There is enlargement of the caudate lobe, unchanged. No focal liver lesion identified. Gallbladder is surgically absent. There is no biliary ductal dilatation. Pancreas: Unremarkable. No pancreatic ductal  dilatation or surrounding inflammatory changes. Spleen: Normal in size without focal abnormality. Adrenals/Urinary Tract: There is a 2 mm calculus in each kidney. There is no hydronephrosis or perinephric fat stranding. The adrenal glands and bladder are within normal limits. Stomach/Bowel: Stomach is within normal limits. Appendix appears normal. No evidence of bowel wall thickening, distention, or inflammatory changes. There is scattered sigmoid colon diverticula. Vascular/Lymphatic: Aortic atherosclerosis. No enlarged abdominal or pelvic lymph nodes. Reproductive: Status post hysterectomy. No adnexal masses. Other: There is a small fat containing umbilical hernia. No ascites. Musculoskeletal: Bones are osteopenic. There is mild chronic appearing compression deformity of L4. IMPRESSION: 1. No acute localizing process in the chest, abdomen or pelvis. 2. Stable fibrotic changes in the lungs. 3. Stable right thyroid nodule. Thyroid ultrasound recommended if not performed previously. 4. Fatty infiltration of the liver. 5. Nonobstructing bilateral renal calculi. 6. Sigmoid colon diverticulosis. Aortic Atherosclerosis (ICD10-I70.0). Electronically Signed   By: Greig Pique M.D.   On: 07/10/2024 21:10     Procedures   Medications Ordered in the ED  cefTRIAXone  (ROCEPHIN ) 1 g in sodium chloride  0.9 % 100 mL IVPB (1 g Intravenous New Bag/Given 07/10/24 2158)  sodium chloride  0.9 % bolus 1,000 mL (0 mLs Intravenous Stopped 07/10/24 2038)  ondansetron  (ZOFRAN ) injection 4 mg (4 mg Intravenous Given 07/10/24 1755)  iohexol  (OMNIPAQUE ) 300 MG/ML solution 100 mL (100 mLs Intravenous Contrast Given 07/10/24 1906)  cefTRIAXone  (ROCEPHIN ) 1 g in sodium chloride  0.9 % 100 mL IVPB (0 g Intravenous Stopped 07/10/24 2128)  morphine  (PF) 4 MG/ML injection 4 mg (4 mg Intravenous Given 07/10/24 2049)  sodium chloride  0.9 % bolus 1,000 mL (1,000 mLs Intravenous New Bag/Given 07/10/24 2048)                                     Medical Decision Making Amount and/or Complexity of Data Reviewed Labs: ordered. Radiology: ordered.  Risk Prescription drug management. Decision regarding hospitalization.   This patient presents to the ED for concern of chills, this involves an extensive number of treatment options, and is a complaint that carries with it a high risk of complications and morbidity.  The differential  diagnosis includes infection, pna, covid/flu/rsv, uti, dehydration   Co morbidities that complicate the patient evaluation  HTN, systemic sclerosis, RA, pulmonary fibrosis, CHF, GERD, scleroderma, and left breast cancer s/p lumpectomy   Additional history obtained:  Additional history obtained from epic chart review External records from outside source obtained and reviewed including husband   Lab Tests:  I Ordered, and personally interpreted labs.  The pertinent results include:  cbc with wbc elevated at 17.5; cmp nl other than k sl low at 3.3 and glucose elevated at 185; mg 1.9; trop nl; lactic 2.0; covid/flu/rsv neg; ua + uti   Imaging Studies ordered:  I ordered imaging studies including ct chest/abd/pelvis  I independently visualized and interpreted imaging which showed  No acute localizing process in the chest, abdomen or pelvis.  2. Stable fibrotic changes in the lungs.  3. Stable right thyroid nodule. Thyroid ultrasound recommended if  not performed previously.  4. Fatty infiltration of the liver.  5. Nonobstructing bilateral renal calculi.  6. Sigmoid colon diverticulosis.    Aortic Atherosclerosis (ICD10-I70.0).   I agree with the radiologist interpretation   Cardiac Monitoring:  The patient was maintained on a cardiac monitor.  I personally viewed and interpreted the cardiac monitored which showed an underlying rhythm of: nsr   Medicines ordered and prescription drug management:  I ordered medication including ivfs  for sx  Reevaluation of the patient after these  medicines showed that the patient improved I have reviewed the patients home medicines and have made adjustments as needed   Test Considered:  ct   Critical Interventions:  ivfs   Consultations Obtained:  I requested consultation with the hospitalist (Dr. Lawence),  and discussed lab and imaging findings as well as pertinent plan - he will admit   Problem List / ED Course:  UTI with sepsis:  pt given ivfs and abx.  BP remains a little soft.  RR up a bit as well.  Pt has improved with treatment.     Reevaluation:  After the interventions noted above, I reevaluated the patient and found that they have :improved   Social Determinants of Health:  Lives at home   Dispostion:  After consideration of the diagnostic results and the patients response to treatment, I feel that the patent would benefit from admission.       Final diagnoses:  Acute cystitis without hematuria  Sepsis without acute organ dysfunction, due to unspecified organism [facility])    ED Discharge Orders     None          Dean Clarity, MD 07/10/24 2221

## 2024-07-11 DIAGNOSIS — M069 Rheumatoid arthritis, unspecified: Secondary | ICD-10-CM | POA: Insufficient documentation

## 2024-07-11 DIAGNOSIS — I10 Essential (primary) hypertension: Secondary | ICD-10-CM | POA: Insufficient documentation

## 2024-07-11 DIAGNOSIS — K219 Gastro-esophageal reflux disease without esophagitis: Secondary | ICD-10-CM | POA: Insufficient documentation

## 2024-07-11 DIAGNOSIS — Z853 Personal history of malignant neoplasm of breast: Secondary | ICD-10-CM

## 2024-07-11 DIAGNOSIS — N390 Urinary tract infection, site not specified: Secondary | ICD-10-CM | POA: Diagnosis not present

## 2024-07-11 DIAGNOSIS — A4150 Gram-negative sepsis, unspecified: Secondary | ICD-10-CM | POA: Diagnosis not present

## 2024-07-11 LAB — BASIC METABOLIC PANEL WITH GFR
Anion gap: 10 (ref 5–15)
BUN: 9 mg/dL (ref 8–23)
CO2: 27 mmol/L (ref 22–32)
Calcium: 7.9 mg/dL — ABNORMAL LOW (ref 8.9–10.3)
Chloride: 100 mmol/L (ref 98–111)
Creatinine, Ser: 0.79 mg/dL (ref 0.44–1.00)
GFR, Estimated: >60 mL/min (ref >60)
Glucose, Bld: 137 mg/dL — ABNORMAL HIGH (ref 70–99)
Potassium: 2.8 mmol/L — ABNORMAL LOW (ref 3.5–5.1)
Sodium: 137 mmol/L (ref 135–145)

## 2024-07-11 LAB — CBC
HCT: 38.1 % (ref 36.0–46.0)
Hemoglobin: 12.9 g/dL (ref 12.0–15.0)
MCH: 32.4 pg (ref 26.0–34.0)
MCHC: 33.9 g/dL (ref 30.0–36.0)
MCV: 95.7 fL (ref 80.0–100.0)
Platelets: 266 K/uL (ref 150–400)
RBC: 3.98 MIL/uL (ref 3.87–5.11)
RDW: 12.6 % (ref 11.5–15.5)
WBC: 20.4 K/uL — ABNORMAL HIGH (ref 4.0–10.5)
nRBC: 0 % (ref 0.0–0.2)

## 2024-07-11 LAB — CORTISOL-AM, BLOOD: Cortisol - AM: 23.9 ug/dL — ABNORMAL HIGH (ref 6.7–22.6)

## 2024-07-11 LAB — PROTIME-INR
INR: 1.1 (ref 0.8–1.2)
Prothrombin Time: 14.7 s (ref 11.4–15.2)

## 2024-07-11 MED ORDER — PREDNISONE 10 MG PO TABS
10.0000 mg | ORAL_TABLET | Freq: Every day | ORAL | Status: DC
  Administered 2024-07-12 – 2024-07-13 (×3): 10 mg via ORAL
  Filled 2024-07-11 (×2): qty 1

## 2024-07-11 MED ORDER — IPRATROPIUM BROMIDE 0.06 % NA SOLN
1.0000 | Freq: Two times a day (BID) | NASAL | Status: DC
  Administered 2024-07-11 – 2024-07-13 (×9): 1 via NASAL
  Filled 2024-07-11: qty 15

## 2024-07-11 MED ORDER — ENSURE PLUS HIGH PROTEIN PO LIQD
237.0000 mL | Freq: Two times a day (BID) | ORAL | Status: DC
  Administered 2024-07-11 – 2024-07-13 (×7): 237 mL via ORAL

## 2024-07-11 MED ORDER — LACTATED RINGERS IV SOLN: INTRAVENOUS | Status: AC

## 2024-07-11 MED ORDER — POTASSIUM CHLORIDE CRYS ER 20 MEQ PO TBCR
40.0000 meq | EXTENDED_RELEASE_TABLET | ORAL | Status: AC
  Administered 2024-07-11 (×4): 40 meq via ORAL
  Filled 2024-07-11 (×2): qty 2

## 2024-07-11 MED ORDER — TRAMADOL HCL 50 MG PO TABS
50.0000 mg | ORAL_TABLET | Freq: Once | ORAL | Status: AC
  Administered 2024-07-11 (×2): 50 mg via ORAL
  Filled 2024-07-11: qty 1

## 2024-07-11 MED ORDER — FLUCONAZOLE 100 MG PO TABS
200.0000 mg | ORAL_TABLET | Freq: Once | ORAL | Status: AC
  Administered 2024-07-11 (×2): 200 mg via ORAL
  Filled 2024-07-11: qty 2

## 2024-07-11 NOTE — Progress Notes (Signed)
 PROGRESS NOTE  [name], is a 78 y.o. female, DOB - 02-14-1946, FMW:[id number]  Admit date - 07/10/2024   Admitting Physician Madison DELENA Peaches, MD  Outpatient Primary MD for the patient is Vickie Carwin, MD  LOS - 1  Chief Complaint  Patient presents with   Generalized Body Aches       Brief Narrative:  78 y.o. female with medical history significant for GERD, essential hypertension, osteoarthritis, pulmonary fibrosis, Raynaud's disease, rheumatoid arthritis, scleroderma, seasonal allergies and diastolic CHF admitted on 07/10/2024 with presumed sepsis secondary to UTI    -Assessment and Plan: 1)Sepsis due to UTI POA - WBC 17.5 >> 20.4  T max 100.6, HR 95 , RR 35 -CT chest, abdomen and pelvis without acute findings -UA suspicious for UTI -c/n Rocephin  pending blood and urine culture data -- Steroid therapy may be contributing to leukocytosis  2)Rheumatoid arthritis/scleroderma/Raynaud's disease - continue Plaquenil  and Prednisone   GERD without esophagitis - c/n Protonix   History of breast cancer -continue Arimidex .  HTN -c/n amlodipine   Hypokalemia--replace and recheck Magnesium  WNL  Chronic steroid therapy due to rheumatoid arthritis/scleroderma---  --AM cortisol level 23.9 - Continue prednisone   Diarrhea--check stool for C. difficile and GI pathogen/culture Receiving IV fluids  Fatty Liver--CT abdomen and pelvis showed fatty liver infiltration -Follow-up PCP as outpatient  Status is: Inpatient   Disposition: The patient is from: Home              Anticipated d/c is to: Home              Anticipated d/c date is: 2 days              Patient currently is not medically stable to d/c. Barriers: Not Clinically Stable-   Code Status :  -  Code Status: Do not attempt resuscitation (DNR) PRE-ARREST INTERVENTIONS DESIRED   Family Communication:   NA (patient is alert, awake and coherent)   DVT Prophylaxis  :   - SCDs  enoxaparin  (LOVENOX ) injection 40 mg Start:  07/11/24 1000   Lab Results  Component Value Date   PLT 266 07/11/2024   Inpatient Medications  Scheduled Meds:  amLODipine   5 mg Oral Daily   anastrozole   1 mg Oral Daily   aspirin  EC  81 mg Oral QHS   enoxaparin  (LOVENOX ) injection  40 mg Subcutaneous Q24H   feeding supplement  237 mL Oral BID BM   hydroxychloroquine   400 mg Oral Daily   ipratropium  1 spray Each Nare Q12H   pantoprazole   40 mg Oral BID   zolpidem   5 mg Oral QHS   Continuous Infusions:  cefTRIAXone  (ROCEPHIN )  IV     lactated ringers      PRN Meds:.acetaminophen  **OR** acetaminophen , albuterol , azelastine , magnesium  hydroxide, ondansetron  **OR** ondansetron  (ZOFRAN ) IV, traZODone    Anti-infectives (From admission, onward)    Start     Dose/Rate Route Frequency Ordered Stop   07/11/24 2100  cefTRIAXone  (ROCEPHIN ) 2 g in sodium chloride  0.9 % 100 mL IVPB        2 g 200 mL/hr over 30 Minutes Intravenous Every 24 hours 07/10/24 2313 07/18/24 2059   07/11/24 1000  hydroxychloroquine  (PLAQUENIL ) tablet 400 mg        400 mg Oral Daily 07/10/24 2313     07/11/24 0415  fluconazole  (DIFLUCAN ) tablet 200 mg        200 mg Oral  Once 07/11/24 0316 07/11/24 0413   07/10/24 2145  cefTRIAXone  (ROCEPHIN ) 1 g in sodium chloride   0.9 % 100 mL IVPB        1 g 200 mL/hr over 30 Minutes Intravenous  Once 07/10/24 2130 07/10/24 2309   07/10/24 2045  cefTRIAXone  (ROCEPHIN ) 1 g in sodium chloride  0.9 % 100 mL IVPB        1 g 200 mL/hr over 30 Minutes Intravenous  Once 07/10/24 2035 07/10/24 2128         Subjective: [name] today has no further fevers, no emesis,  No chest pain,   - Diarrhea persist--  Objective: Vitals:   07/11/24 0523 07/11/24 0810 07/11/24 1209 07/11/24 1326  BP: (!) 130/41 (!) 115/45 (!) 114/45 (!) 125/50  Pulse: 86 80 82 83  Resp: (!) 22   16  Temp: 99.3 F (37.4 C) 98.5 F (36.9 C) 98.2 F (36.8 C) 97.9 F (36.6 C)  TempSrc: Oral Oral Oral Oral  SpO2: 99%   92%  Weight:       Height:        Intake/Output Summary (Last 24 hours) at 07/11/2024 1729 Last data filed at 07/11/2024 1512 Gross per 24 hour  Intake 2268.89 ml  Output --  Net 2268.89 ml   Filed Weights   07/10/24 1307  Weight: 77.1 kg    Physical Exam  Gen:- Awake Alert,  in no apparent distress  HEENT:- [place].AT, No sclera icterus Neck-Supple Neck,No JVD,.  Lungs-  CTAB , fair symmetrical air movement CV- S1, S2 normal, regular  Abd-  +ve B.Sounds, Abd Soft, No tenderness,    Extremity/Skin:- No  edema, pedal pulses present  Psych-affect is appropriate, oriented x3 Neuro-no new focal deficits, no tremors  Data Reviewed: I have personally reviewed following labs and imaging studies  CBC: Recent Labs  Lab 07/10/24 1335 07/11/24 0439  WBC 17.5* 20.4*  NEUTROABS 14.4*  --   HGB 15.8* 12.9  HCT 45.2 38.1  MCV 94.6 95.7  PLT 322 266   Basic Metabolic Panel: Recent Labs  Lab 07/10/24 1335 07/11/24 0439  NA 132* 137  K 3.3* 2.8*  CL 90* 100  CO2 28 27  GLUCOSE 185* 137*  BUN 15 9  CREATININE 0.92 0.79  CALCIUM  8.5* 7.9*  MG 1.9  --    GFR: Estimated Creatinine Clearance: 58.8 mL/min (by C-G formula based on SCr of 0.79 mg/dL). Liver Function Tests: Recent Labs  Lab 07/10/24 1335  AST 30  ALT 18  ALKPHOS 87  BILITOT 1.3*  PROT 7.5  ALBUMIN 3.5   Recent Results (from the past 240 hours)  Resp panel by RT-PCR (RSV, Flu A&B, Covid) Anterior Nasal Swab     Status: None   Collection Time: 07/10/24  5:37 PM   Specimen: Anterior Nasal Swab  Result Value Ref Range Status   SARS Coronavirus 2 by RT PCR NEGATIVE NEGATIVE Final    Comment: (NOTE) SARS-CoV-2 target nucleic acids are NOT DETECTED.  The SARS-CoV-2 RNA is generally detectable in upper respiratory specimens during the acute phase of infection. The lowest concentration of SARS-CoV-2 viral copies this assay can detect is 138 copies/mL. A negative result does not preclude SARS-Cov-2 infection and should not be  used as the sole basis for treatment or other patient management decisions. A negative result may occur with  improper specimen collection/handling, submission of specimen other than nasopharyngeal swab, presence of viral mutation(s) within the areas targeted by this assay, and inadequate number of viral copies(<138 copies/mL). A negative result must be combined with clinical observations, patient history, and epidemiological information.  The expected result is Negative.  Fact Sheet for Patients:  BloggerCourse.com  Fact Sheet for Healthcare Providers:  SeriousBroker.it  This test is no t yet approved or cleared by the [place]  FDA and  has been authorized for detection and/or diagnosis of SARS-CoV-2 by FDA under an Emergency Use Authorization (EUA). This EUA will remain  in effect (meaning this test can be used) for the duration of the COVID-19 declaration under Section 564(b)(1) of the Act, 21 U.S.C.section 360bbb-3(b)(1), unless the authorization is terminated  or revoked sooner.       Influenza A by PCR NEGATIVE NEGATIVE Final   Influenza B by PCR NEGATIVE NEGATIVE Final    Comment: (NOTE) The Xpert Xpress SARS-CoV-2/FLU/RSV plus assay is intended as an aid in the diagnosis of influenza from Nasopharyngeal swab specimens and should not be used as a sole basis for treatment. Nasal washings and aspirates are unacceptable for Xpert Xpress SARS-CoV-2/FLU/RSV testing.  Fact Sheet for Patients: BloggerCourse.com  Fact Sheet for Healthcare Providers: SeriousBroker.it  This test is not yet approved or cleared by the [place]  FDA and has been authorized for detection and/or diagnosis of SARS-CoV-2 by FDA under an Emergency Use Authorization (EUA). This EUA will remain in effect (meaning this test can be used) for the duration of the COVID-19 declaration under Section  564(b)(1) of the Act, 21 U.S.C. section 360bbb-3(b)(1), unless the authorization is terminated or revoked.     Resp Syncytial Virus by PCR NEGATIVE NEGATIVE Final    Comment: (NOTE) Fact Sheet for Patients: BloggerCourse.com  Fact Sheet for Healthcare Providers: SeriousBroker.it  This test is not yet approved or cleared by the [place]  FDA and has been authorized for detection and/or diagnosis of SARS-CoV-2 by FDA under an Emergency Use Authorization (EUA). This EUA will remain in effect (meaning this test can be used) for the duration of the COVID-19 declaration under Section 564(b)(1) of the Act, 21 U.S.C. section 360bbb-3(b)(1), unless the authorization is terminated or revoked.  Performed at [facility], [address] [place], [place] [address]   Culture, blood (single)     Status: None (Preliminary result)   Collection Time: 07/10/24  9:52 PM   Specimen: BLOOD  Result Value Ref Range Status   Specimen Description BLOOD LEFT ANTECUBITAL  Final   Special Requests   Final    BOTTLES DRAWN AEROBIC AND ANAEROBIC Blood Culture adequate volume   Culture   Final    NO GROWTH < 12 HOURS Performed at [facility], [address], [place], [place] [address]    Report Status PENDING  Incomplete    Radiology Studies: CT CHEST ABDOMEN PELVIS W CONTRAST Result Date: 07/10/2024 CLINICAL DATA:  Sepsis. EXAM: CT CHEST, ABDOMEN, AND PELVIS WITH CONTRAST TECHNIQUE: Multidetector CT imaging of the chest, abdomen and pelvis was performed following the standard protocol during bolus administration of intravenous contrast. RADIATION DOSE REDUCTION: This exam was performed according to the departmental dose-optimization program which includes automated exposure control, adjustment of the mA and/or kV according to patient size and/or use of iterative reconstruction technique. CONTRAST:  OMNIPAQUE  IOHEXOL  300 MG/ML  SOLN COMPARISON:  CT  chest 05/03/2024.  CT abdomen and pelvis 02/05/2021. FINDINGS: CT CHEST FINDINGS Cardiovascular: Heart is mildly enlarged. Aorta is normal in size. There are atherosclerotic calcifications of the aorta. There is no pericardial effusion. Mediastinum/Nodes: There are no enlarged mediastinal or hilar lymph nodes identified. Visualized esophagus is within normal limits. There is a heterogeneous 2.5 cm right thyroid nodule, grossly  unchanged. Lungs/Pleura: Fibrotic changes are again seen in both lungs most prominent in the right middle lobe, lingula and lung bases. There is been no significant interval change. There is no new focal lung infiltrate, pleural effusion or pneumothorax. Musculoskeletal: There surgical clips and scarring in the left breast, unchanged. No acute bony abnormality identified. Degenerative changes affect the spine. Right shoulder arthroplasty is present. CT ABDOMEN PELVIS FINDINGS Hepatobiliary: There is diffuse fatty infiltration of the liver. There is enlargement of the caudate lobe, unchanged. No focal liver lesion identified. Gallbladder is surgically absent. There is no biliary ductal dilatation. Pancreas: Unremarkable. No pancreatic ductal dilatation or surrounding inflammatory changes. Spleen: Normal in size without focal abnormality. Adrenals/Urinary Tract: There is a 2 mm calculus in each kidney. There is no hydronephrosis or perinephric fat stranding. The adrenal glands and bladder are within normal limits. Stomach/Bowel: Stomach is within normal limits. Appendix appears normal. No evidence of bowel wall thickening, distention, or inflammatory changes. There is scattered sigmoid colon diverticula. Vascular/Lymphatic: Aortic atherosclerosis. No enlarged abdominal or pelvic lymph nodes. Reproductive: Status post hysterectomy. No adnexal masses. Other: There is a small fat containing umbilical hernia. No ascites. Musculoskeletal: Bones are osteopenic. There is mild chronic appearing  compression deformity of L4. IMPRESSION: 1. No acute localizing process in the chest, abdomen or pelvis. 2. Stable fibrotic changes in the lungs. 3. Stable right thyroid nodule. Thyroid ultrasound recommended if not performed previously. 4. Fatty infiltration of the liver. 5. Nonobstructing bilateral renal calculi. 6. Sigmoid colon diverticulosis. Aortic Atherosclerosis (ICD10-I70.0). Electronically Signed   By: Greig Pique M.D.   On: 07/10/2024 21:10   Scheduled Meds:  amLODipine   5 mg Oral Daily   anastrozole   1 mg Oral Daily   aspirin  EC  81 mg Oral QHS   enoxaparin  (LOVENOX ) injection  40 mg Subcutaneous Q24H   feeding supplement  237 mL Oral BID BM   hydroxychloroquine   400 mg Oral Daily   ipratropium  1 spray Each Nare Q12H   pantoprazole   40 mg Oral BID   zolpidem   5 mg Oral QHS   Continuous Infusions:  cefTRIAXone  (ROCEPHIN )  IV     lactated ringers       LOS: 1 day   Rendall [name] M.D on 07/11/2024 at 5:29 PM  Go to [email or web address] - for contact info  [facility] - Office  [phone]  If 7PM-7AM, please contact night-coverage [email or web address] 07/11/2024, 5:29 PM

## 2024-07-11 NOTE — Assessment & Plan Note (Signed)
-   Will continue antihypertensive therapy.

## 2024-07-11 NOTE — Progress Notes (Signed)
 Transition of Care Department [facility]) has reviewed patient and no other TOC needs have been identified at this time. We will continue to monitor patient advancement through interdisciplinary progression rounds. If new patient transition needs arise, please place a TOC consult.   07/11/24 0757  TOC Brief Assessment  Insurance and Status Reviewed  Patient has primary care physician Yes  Home environment has been reviewed Lives with husband.  Prior level of function: Independent.  Prior/Current Home Services No current home services  Social Drivers of Health Review SDOH reviewed no interventions necessary  Readmission risk has been reviewed Yes  Transition of care needs no transition of care needs at this time

## 2024-07-11 NOTE — Assessment & Plan Note (Signed)
-   Will continue PPI therapy.

## 2024-07-11 NOTE — Assessment & Plan Note (Signed)
-   Will continue Arimidex .

## 2024-07-11 NOTE — Assessment & Plan Note (Signed)
-   Will continue Plaquenil .

## 2024-07-11 NOTE — Assessment & Plan Note (Signed)
-   The patient will be admitted to a medical telemetry bed. - Will continue antibiotic therapy with IV Rocephin . - Will continue hydration with IV normal saline. - Sepsis manifested by significant leukocytosis and heart rate that was up to 93 and respiratory rate of 25. - Will follow blood and urine cultures.

## 2024-07-12 DIAGNOSIS — A4150 Gram-negative sepsis, unspecified: Secondary | ICD-10-CM | POA: Diagnosis not present

## 2024-07-12 DIAGNOSIS — N390 Urinary tract infection, site not specified: Secondary | ICD-10-CM | POA: Diagnosis not present

## 2024-07-12 LAB — CBC
HCT: 38.1 % (ref 36.0–46.0)
Hemoglobin: 12.7 g/dL (ref 12.0–15.0)
MCH: 32.2 pg (ref 26.0–34.0)
MCHC: 33.3 g/dL (ref 30.0–36.0)
MCV: 96.7 fL (ref 80.0–100.0)
Platelets: 269 K/uL (ref 150–400)
RBC: 3.94 MIL/uL (ref 3.87–5.11)
RDW: 12.6 % (ref 11.5–15.5)
WBC: 19.5 K/uL — ABNORMAL HIGH (ref 4.0–10.5)
nRBC: 0 % (ref 0.0–0.2)

## 2024-07-12 LAB — RENAL FUNCTION PANEL
Albumin: 2.7 g/dL — ABNORMAL LOW (ref 3.5–5.0)
Anion gap: 10 (ref 5–15)
BUN: 6 mg/dL — ABNORMAL LOW (ref 8–23)
CO2: 31 mmol/L (ref 22–32)
Calcium: 8 mg/dL — ABNORMAL LOW (ref 8.9–10.3)
Chloride: 96 mmol/L — ABNORMAL LOW (ref 98–111)
Creatinine, Ser: 0.66 mg/dL (ref 0.44–1.00)
GFR, Estimated: >60 mL/min (ref >60)
Glucose, Bld: 131 mg/dL — ABNORMAL HIGH (ref 70–99)
Phosphorus: 2.1 mg/dL — ABNORMAL LOW (ref 2.5–4.6)
Potassium: 2.9 mmol/L — ABNORMAL LOW (ref 3.5–5.1)
Sodium: 137 mmol/L (ref 135–145)

## 2024-07-12 LAB — C DIFFICILE QUICK SCREEN W PCR REFLEX
C Diff antigen: NEGATIVE
C Diff interpretation: NOT DETECTED
C Diff toxin: NEGATIVE

## 2024-07-12 MED ORDER — POTASSIUM PHOSPHATES 15 MMOLE/5ML IV SOLN
30.0000 mmol | Freq: Once | INTRAVENOUS | Status: AC
  Administered 2024-07-12 (×2): 30 mmol via INTRAVENOUS
  Filled 2024-07-12: qty 10

## 2024-07-12 MED ORDER — POTASSIUM CHLORIDE 20 MEQ PO PACK
40.0000 meq | PACK | Freq: Two times a day (BID) | ORAL | Status: AC
  Administered 2024-07-12 – 2024-07-13 (×5): 40 meq via ORAL
  Filled 2024-07-12 (×3): qty 2

## 2024-07-12 MED ORDER — POTASSIUM CHLORIDE CRYS ER 20 MEQ PO TBCR
40.0000 meq | EXTENDED_RELEASE_TABLET | ORAL | Status: DC
  Filled 2024-07-12: qty 2

## 2024-07-12 NOTE — Progress Notes (Signed)
 Initial Nutrition Assessment  DOCUMENTATION CODES:   Obesity unspecified  INTERVENTION:   -Liberalize diet to 2 gram sodium for wider variety of meal selections -MVI with minerals daily -Ensure Plus High Protein po BID, each supplement provides 350 kcal and 20 grams of protein   NUTRITION DIAGNOSIS:   Increased nutrient needs related to chronic illness as evidenced by estimated needs.  GOAL:   Patient will meet greater than or equal to 90% of their needs  MONITOR:   PO intake, Supplement acceptance  REASON FOR ASSESSMENT:   Malnutrition Screening Tool    ASSESSMENT:   Pt with medical history significant for GERD, essential hypertension, osteoarthritis, pulmonary fibrosis, Raynaud's disease, rheumatoid arthritis, scleroderma, seasonal allergies and diastolic CHF, who presented to the emergency room with acute onset of significant anorexia over the last week with lower abdominal discomfort and back pain.  Pt admitted with sepsis secondary to gram negative UTI.   Reviewed I/Os: +2 L x 24 hours and +2.8 L since admission  Pt unavailable at time of visit. Attempted to speak with pt via call to hospital room phone, however, unable to reach. RD unable to obtain further nutrition-related history or complete nutrition-focused physical exam at this time.    Per MD notes, CT of abdomen revealed fatty liver infiltration.   Pt is on chronic steroid therapy secondary to scleroderma; MD suspects steroids may be contributing to leukocytosis.   Pt currently on a heart healthy diet. Noted meal completions 50-100%. Ensure supplements have been ordered, which pt has been consuming.   Case discussed with RN, who reports pt is eating and drinking well. She is drinking her Ensure supplements.   Reviewed wt hx; pt has experienced a 6.9% wt loss over the past month, which is significant for time frame. Given wt loss and poor oral intake PTA, pt is at high risk for malnutrition, however, unable  to identify at this time. Pt would greatly benefit from addition of oral nutrition supplements.   Medications reviewed and include lovenox , potassium chloride , and prednisone .   Labs reviewed: K: 2.9 (on IV supplementation).    Diet Order:   Diet Order             Diet 2 gram sodium Fluid consistency: Thin  Diet effective now                   EDUCATION NEEDS:   No education needs have been identified at this time  Skin:  Skin Assessment: Reviewed RN Assessment  Last BM:  07/11/24  Height:   Ht Readings from Last 1 Encounters:  07/10/24 5' 3 (1.6 m)    Weight:   Wt Readings from Last 1 Encounters:  07/10/24 77.1 kg    Ideal Body Weight:  52.3 kg  BMI:  Body mass index is 30.11 kg/m.  Estimated Nutritional Needs:   Kcal:  1650-1850  Protein:  85-100 grams  Fluid:  1.6-1.8 L    Margery ORN, RD, LDN, CDCES Registered Dietitian III Certified Diabetes Care and Education Specialist If unable to reach this RD, please use RD Inpatient group chat on secure chat between hours of 8am-4 pm daily

## 2024-07-12 NOTE — Progress Notes (Addendum)
 PROGRESS NOTE  [name], is a 78 y.o. female, DOB - 01/13/1946, FMW:[id number]  Admit date - 07/10/2024   Admitting Physician Vickie DELENA Peaches, MD  Outpatient Primary MD for the patient is Vickie Carwin, MD  LOS - 2  Chief Complaint  Patient presents with   Generalized Body Aches       Brief Narrative:  78 y.o. female with medical history significant for GERD, essential hypertension, osteoarthritis, pulmonary fibrosis, Raynaud's disease, rheumatoid arthritis, scleroderma, seasonal allergies and diastolic CHF admitted on 07/10/2024 with presumed sepsis secondary to UTI    -Assessment and Plan: 1)Sepsis due to UTI POA - WBC 17.5 >> 20.4 >>19.5 T max 100.6, HR 95 , RR 35 -CT chest, abdomen and pelvis without acute findings -UA suspicious for UTI -c/n Rocephin  pending blood and urine culture data -- Steroid therapy may be contributing to leukocytosis  2)Rheumatoid arthritis/scleroderma/Raynaud's disease - continue Plaquenil  and Prednisone   GERD without esophagitis - c/n Protonix   History of breast cancer -continue Arimidex .  HTN -c/n amlodipine   Hypokalemia--replace and recheck Magnesium  WNL  Chronic steroid therapy due to rheumatoid arthritis/scleroderma---  --AM cortisol level 23.9 - Continue prednisone   Diarrhea--check stool for C. difficile and GI pathogen/culture Receiving IV fluids  Fatty Liver--CT abdomen and pelvis showed fatty liver infiltration -Follow-up PCP as outpatient  Status is: Inpatient   Disposition: The patient is from: Home              Anticipated d/c is to: Home              Anticipated d/c date is: 1 day              Patient currently is not medically stable to d/c. Barriers: Not Clinically Stable-   Code Status :  -  Code Status: Do not attempt resuscitation (DNR) PRE-ARREST INTERVENTIONS DESIRED   Family Communication:   NA (patient is alert, awake and coherent)   DVT Prophylaxis  :   - SCDs  enoxaparin  (LOVENOX ) injection 40 mg  Start: 07/11/24 1000   Lab Results  Component Value Date   PLT 269 07/12/2024   Inpatient Medications  Scheduled Meds:  amLODipine   5 mg Oral Daily   anastrozole   1 mg Oral Daily   aspirin  EC  81 mg Oral QHS   enoxaparin  (LOVENOX ) injection  40 mg Subcutaneous Q24H   feeding supplement  237 mL Oral BID BM   hydroxychloroquine   400 mg Oral Daily   ipratropium  1 spray Each Nare Q12H   pantoprazole   40 mg Oral BID   potassium chloride   40 mEq Oral BID   predniSONE   10 mg Oral Q breakfast   zolpidem   5 mg Oral QHS   Continuous Infusions:  cefTRIAXone  (ROCEPHIN )  IV 2 g (07/11/24 2116)   PRN Meds:.acetaminophen  **OR** acetaminophen , albuterol , azelastine , magnesium  hydroxide, ondansetron  **OR** ondansetron  (ZOFRAN ) IV, traZODone   Anti-infectives (From admission, onward)    Start     Dose/Rate Route Frequency Ordered Stop   07/11/24 2100  cefTRIAXone  (ROCEPHIN ) 2 g in sodium chloride  0.9 % 100 mL IVPB        2 g 200 mL/hr over 30 Minutes Intravenous Every 24 hours 07/10/24 2313 07/18/24 2059   07/11/24 1000  hydroxychloroquine  (PLAQUENIL ) tablet 400 mg        400 mg Oral Daily 07/10/24 2313     07/11/24 0415  fluconazole  (DIFLUCAN ) tablet 200 mg        200 mg Oral  Once 07/11/24 0316 07/11/24  0413   07/10/24 2145  cefTRIAXone  (ROCEPHIN ) 1 g in sodium chloride  0.9 % 100 mL IVPB        1 g 200 mL/hr over 30 Minutes Intravenous  Once 07/10/24 2130 07/10/24 2309   07/10/24 2045  cefTRIAXone  (ROCEPHIN ) 1 g in sodium chloride  0.9 % 100 mL IVPB        1 g 200 mL/hr over 30 Minutes Intravenous  Once 07/10/24 2035 07/10/24 2128         Subjective: [name] today has [name] further fevers, [name] emesis,  [name] chest pain,   - Diarrhea persist-- -oral intake is fair - [name] CVA area tenderness  Objective: Vitals:   07/11/24 1326 07/11/24 2109 07/12/24 0552 07/12/24 1356  BP: (!) 125/50 (!) 106/41 (!) 137/42 (!) 97/43  Pulse: 83 78 85 77  Resp: 16 20 19 18   Temp: 97.9 F (36.6  C) 98.7 F (37.1 C) 99.1 F (37.3 C) 98.6 F (37 C)  TempSrc: Oral Oral Oral Oral  SpO2: 92% 97% 90% 95%  Weight:      Height:        Intake/Output Summary (Last 24 hours) at 07/12/2024 1929 Last data filed at 07/12/2024 1500 Gross per 24 hour  Intake 1180.27 ml  Output --  Net 1180.27 ml   Filed Weights   07/10/24 1307  Weight: 77.1 kg    Physical Exam Gen:- Awake Alert,  in [name] apparent distress  HEENT:- [place].AT, [name] sclera icterus Neck-Supple Neck,[name] JVD,.  Lungs-  Velcro- type rales consistent with patient's established history of pulmonary fibrosis, air movement is fair CV- S1, S2 normal, regular  Abd-  +ve B.Sounds, Abd Soft, [name] tenderness, - [name] CVA area tenderness  Extremity/Skin:- [name]  edema, pedal pulses present  Psych-affect is appropriate, oriented x3 Neuro-[name] new focal deficits, [name] tremors  Data Reviewed: I have personally reviewed following labs and imaging studies  CBC: Recent Labs  Lab 07/10/24 1335 07/11/24 0439 07/12/24 0427  WBC 17.5* 20.4* 19.5*  NEUTROABS 14.4*  --   --   HGB 15.8* 12.9 12.7  HCT 45.2 38.1 38.1  MCV 94.6 95.7 96.7  PLT 322 266 269   Basic Metabolic Panel: Recent Labs  Lab 07/10/24 1335 07/11/24 0439 07/12/24 0427  NA 132* 137 137  K 3.3* 2.8* 2.9*  CL 90* 100 96*  CO2 28 27 31   GLUCOSE 185* 137* 131*  BUN 15 9 6*  CREATININE 0.92 0.79 0.66  CALCIUM  8.5* 7.9* 8.0*  MG 1.9  --   --   PHOS  --   --  2.1*   GFR: Estimated Creatinine Clearance: 58.8 mL/min (by C-G formula based on SCr of 0.66 mg/dL). Liver Function Tests: Recent Labs  Lab 07/10/24 1335 07/12/24 0427  AST 30  --   ALT 18  --   ALKPHOS 87  --   BILITOT 1.3*  --   PROT 7.5  --   ALBUMIN 3.5 2.7*   Recent Results (from the past 240 hours)  Resp panel by RT-PCR (RSV, Flu A&B, Covid) Anterior Nasal Swab     Status: None   Collection Time: 07/10/24  5:37 PM   Specimen: Anterior Nasal Swab  Result Value Ref Range Status   SARS Coronavirus 2 by RT  PCR NEGATIVE NEGATIVE Final    Comment: (NOTE) SARS-CoV-2 target nucleic acids are NOT DETECTED.  The SARS-CoV-2 RNA is generally detectable in upper respiratory specimens during the acute phase of infection. The lowest concentration of SARS-CoV-2  viral copies this assay can detect is 138 copies/mL. A negative result does not preclude SARS-Cov-2 infection and should not be used as the sole basis for treatment or other patient management decisions. A negative result may occur with  improper specimen collection/handling, submission of specimen other than nasopharyngeal swab, presence of viral mutation(s) within the areas targeted by this assay, and inadequate number of viral copies(<138 copies/mL). A negative result must be combined with clinical observations, patient history, and epidemiological information. The expected result is Negative.  Fact Sheet for Patients:  BloggerCourse.com  Fact Sheet for Healthcare Providers:  SeriousBroker.it  This test is [name] t yet approved or cleared by the [place]  FDA and  has been authorized for detection and/or diagnosis of SARS-CoV-2 by FDA under an Emergency Use Authorization (EUA). This EUA will remain  in effect (meaning this test can be used) for the duration of the COVID-19 declaration under Section 564(b)(1) of the Act, 21 U.S.C.section 360bbb-3(b)(1), unless the authorization is terminated  or revoked sooner.       Influenza A by PCR NEGATIVE NEGATIVE Final   Influenza B by PCR NEGATIVE NEGATIVE Final    Comment: (NOTE) The Xpert Xpress SARS-CoV-2/FLU/RSV plus assay is intended as an aid in the diagnosis of influenza from Nasopharyngeal swab specimens and should not be used as a sole basis for treatment. Nasal washings and aspirates are unacceptable for Xpert Xpress SARS-CoV-2/FLU/RSV testing.  Fact Sheet for Patients: BloggerCourse.com  Fact Sheet for  Healthcare Providers: SeriousBroker.it  This test is not yet approved or cleared by the [place]  FDA and has been authorized for detection and/or diagnosis of SARS-CoV-2 by FDA under an Emergency Use Authorization (EUA). This EUA will remain in effect (meaning this test can be used) for the duration of the COVID-19 declaration under Section 564(b)(1) of the Act, 21 U.S.C. section 360bbb-3(b)(1), unless the authorization is terminated or revoked.     Resp Syncytial Virus by PCR NEGATIVE NEGATIVE Final    Comment: (NOTE) Fact Sheet for Patients: BloggerCourse.com  Fact Sheet for Healthcare Providers: SeriousBroker.it  This test is not yet approved or cleared by the [place]  FDA and has been authorized for detection and/or diagnosis of SARS-CoV-2 by FDA under an Emergency Use Authorization (EUA). This EUA will remain in effect (meaning this test can be used) for the duration of the COVID-19 declaration under Section 564(b)(1) of the Act, 21 U.S.C. section 360bbb-3(b)(1), unless the authorization is terminated or revoked.  Performed at [facility], [address] [place], [place] [address]   Culture, blood (single)     Status: None (Preliminary result)   Collection Time: 07/10/24  9:52 PM   Specimen: BLOOD  Result Value Ref Range Status   Specimen Description BLOOD LEFT ANTECUBITAL  Final   Special Requests   Final    BOTTLES DRAWN AEROBIC AND ANAEROBIC Blood Culture adequate volume   Culture   Final    [name] GROWTH 2 DAYS Performed at [facility], [address], [place], [place] [address]    Report Status PENDING  Incomplete  C Difficile Quick Screen w PCR reflex     Status: None   Collection Time: 07/12/24  1:51 PM   Specimen: STOOL  Result Value Ref Range Status   C Diff antigen NEGATIVE NEGATIVE Final   C Diff toxin NEGATIVE NEGATIVE Final   C Diff interpretation [name] C. difficile  detected.  Final    Comment: Performed at [facility], [address], [place], [place] [address]  Radiology Studies: [name] results found.  Scheduled Meds:  amLODipine   5 mg Oral Daily   anastrozole   1 mg Oral Daily   aspirin  EC  81 mg Oral QHS   enoxaparin  (LOVENOX ) injection  40 mg Subcutaneous Q24H   feeding supplement  237 mL Oral BID BM   hydroxychloroquine   400 mg Oral Daily   ipratropium  1 spray Each Nare Q12H   pantoprazole   40 mg Oral BID   potassium chloride   40 mEq Oral BID   predniSONE   10 mg Oral Q breakfast   zolpidem   5 mg Oral QHS   Continuous Infusions:  cefTRIAXone  (ROCEPHIN )  IV 2 g (07/11/24 2116)    LOS: 2 days   [name] M.D on 07/12/2024 at 7:29 PM  Go to [email or web address] - for contact info  [facility] - Office  [phone]  If 7PM-7AM, please contact night-coverage [email or web address] 07/12/2024, 7:29 PM

## 2024-07-13 DIAGNOSIS — A4150 Gram-negative sepsis, unspecified: Secondary | ICD-10-CM | POA: Diagnosis not present

## 2024-07-13 DIAGNOSIS — N390 Urinary tract infection, site not specified: Secondary | ICD-10-CM | POA: Diagnosis not present

## 2024-07-13 LAB — RENAL FUNCTION PANEL
Albumin: 2.5 g/dL — ABNORMAL LOW (ref 3.5–5.0)
Anion gap: 7 (ref 5–15)
BUN: 9 mg/dL (ref 8–23)
CO2: 31 mmol/L (ref 22–32)
Calcium: 8.3 mg/dL — ABNORMAL LOW (ref 8.9–10.3)
Chloride: 99 mmol/L (ref 98–111)
Creatinine, Ser: 0.69 mg/dL (ref 0.44–1.00)
GFR, Estimated: >60 mL/min (ref >60)
Glucose, Bld: 110 mg/dL — ABNORMAL HIGH (ref 70–99)
Phosphorus: 2.7 mg/dL (ref 2.5–4.6)
Potassium: 3.7 mmol/L (ref 3.5–5.1)
Sodium: 137 mmol/L (ref 135–145)

## 2024-07-13 LAB — GASTROINTESTINAL PANEL BY PCR, STOOL (REPLACES STOOL CULTURE)

## 2024-07-13 LAB — CBC
HCT: 41.3 % (ref 36.0–46.0)
Hemoglobin: 13.9 g/dL (ref 12.0–15.0)
MCH: 32.6 pg (ref 26.0–34.0)
MCHC: 33.7 g/dL (ref 30.0–36.0)
MCV: 96.9 fL (ref 80.0–100.0)
Platelets: 350 K/uL (ref 150–400)
RBC: 4.26 MIL/uL (ref 3.87–5.11)
RDW: 12.8 % (ref 11.5–15.5)
WBC: 15 K/uL — ABNORMAL HIGH (ref 4.0–10.5)
nRBC: 0 % (ref 0.0–0.2)

## 2024-07-13 LAB — MAGNESIUM: Magnesium: 1.8 mg/dL (ref 1.7–2.4)

## 2024-07-13 MED ORDER — CEFADROXIL 500 MG PO CAPS: 500.0000 mg | ORAL_CAPSULE | Freq: Two times a day (BID) | ORAL | 0 refills | Status: AC

## 2024-07-13 NOTE — Plan of Care (Signed)
  Problem: Education: Goal: Knowledge of General Education information will improve Description: Including pain rating scale, medication(s)/side effects and non-pharmacologic comfort measures Outcome: Adequate for Discharge   Problem: Health Behavior/Discharge Planning: Goal: Ability to manage health-related needs will improve Outcome: Adequate for Discharge   Problem: Clinical Measurements: Goal: Ability to maintain clinical measurements within normal limits will improve Outcome: Adequate for Discharge Goal: Will remain free from infection Outcome: Adequate for Discharge Goal: Diagnostic test results will improve Outcome: Adequate for Discharge Goal: Respiratory complications will improve Outcome: Adequate for Discharge Goal: Cardiovascular complication will be avoided Outcome: Adequate for Discharge   Problem: Activity: Goal: Risk for activity intolerance will decrease Outcome: Adequate for Discharge   Problem: Nutrition: Goal: Adequate nutrition will be maintained Outcome: Adequate for Discharge   Problem: Coping: Goal: Level of anxiety will decrease Outcome: Adequate for Discharge   Problem: Elimination: Goal: Will not experience complications related to bowel motility Outcome: Adequate for Discharge Goal: Will not experience complications related to urinary retention Outcome: Adequate for Discharge   Problem: Pain Managment: Goal: General experience of comfort will improve and/or be controlled Outcome: Adequate for Discharge   Problem: Safety: Goal: Ability to remain free from injury will improve Outcome: Adequate for Discharge   Problem: Skin Integrity: Goal: Risk for impaired skin integrity will decrease Outcome: Adequate for Discharge   Problem: Fluid Volume: Goal: Hemodynamic stability will improve Outcome: Adequate for Discharge   Problem: Clinical Measurements: Goal: Diagnostic test results will improve Outcome: Adequate for Discharge Goal: Signs  and symptoms of infection will decrease Outcome: Adequate for Discharge   Problem: Respiratory: Goal: Ability to maintain adequate ventilation will improve Outcome: Adequate for Discharge   Problem: Increased Nutrient Needs (NI-5.1) Goal: Food and/or nutrient delivery Description: Individualized approach for food/nutrient provision. Outcome: Adequate for Discharge

## 2024-07-13 NOTE — Discharge Summary (Addendum)
 [name], is a 78 y.o. female  DOB [birth year]  MRN [id number].  Admission date:  07/10/2024  Admitting Physician  Madison DELENA Peaches, MD  Discharge Date:  07/13/2024   Primary MD  Sheryle Carwin, MD  Recommendations for primary care physician for things to follow:  1)Please call [phone] on Saturday 07/15/24 and ask for Dr. Elidia Arrien to get Final Report on your Urine Culture and to see if we need to change your antibiotic   2)Drink fluids and avoid dehydration  3)Avoid ibuprofen/Advil/Aleve/Motrin/Goody Powders/Naproxen/BC powders/Meloxicam/Diclofenac/Indomethacin and other Nonsteroidal anti-inflammatory medications as these will make you more likely to bleed and can cause stomach ulcers, can also cause Kidney problems.   Admission Diagnosis  Acute cystitis without hematuria [N30.00] Sepsis due to gram-negative UTI (HCC) [A41.50, N39.0] Sepsis without acute organ dysfunction, due to unspecified organism [facility]) [A41.9]  Discharge Diagnosis  Acute cystitis without hematuria [N30.00] Sepsis due to gram-negative UTI (HCC) [A41.50, N39.0] Sepsis without acute organ dysfunction, due to unspecified organism [facility]) [A41.9]   Principal Problem:   Sepsis due to gram-negative UTI [facility]) Active Problems:   Essential hypertension   History of breast cancer   GERD without esophagitis   Rheumatoid arthritis (HCC)      Past Medical History:  Diagnosis Date   Cancer (HCC)    Left breast cancer   Diastolic congestive heart failure (HCC)    02/05/15  states no fluid in extremities, breathing same as has been   GERD (gastroesophageal reflux disease)    Hepatic steatosis    History of blood transfusion    Hx of cholecystectomy    Hypertension    Interstitial lung disease (HCC)    Osteoarthritis    Pulmonary fibrosis (HCC)    Raynaud disease    Rheumatoid arthritis(714.0)    Right BBB/left ant fasc block     Scleroderma (HCC)    Seasonal allergies    Shortness of breath dyspnea    states normal for her   Systemic sclerosis [facility])     Past Surgical History:  Procedure Laterality Date   ABDOMINAL HYSTERECTOMY     BLADDER SUSPENSION     BREAST BIOPSY  11/03/2022   MM LT RADIOACTIVE SEED LOC MAMMO GUIDE 11/03/2022 [facility] MAMMOGRAPHY   BREAST LUMPECTOMY WITH RADIOACTIVE SEED LOCALIZATION Left 11/04/2022   Procedure: LEFT BREAST LUMPECTOMY WITH RADIOACTIVE SEED LOCALIZATION;  Surgeon: Vanderbilt Ned, MD;  Location: [facility] OR;  Service: General;  Laterality: Left;   CARPAL TUNNEL RELEASE Right 2007   EYE SURGERY Bilateral 2023   cataract w/ IOL   LAPAROSCOPIC CHOLECYSTECTOMY     LEFT HEART CATHETERIZATION WITH CORONARY/GRAFT ANGIOGRAM  03/15/2014   Procedure: LEFT HEART CATHETERIZATION WITH EL BILE;  Surgeon: Lonni JONETTA Cash, MD;  Location: [facility] CATH LAB;  Service: Cardiovascular;;   REVERSE SHOULDER ARTHROPLASTY Right 05/06/2023   Procedure: REVERSE SHOULDER ARTHROPLASTY;  Surgeon: Melita Drivers, MD;  Location: [facility] ORS;  Service: Orthopedics;  Laterality: Right;    TONSILLECTOMY     TOTAL KNEE ARTHROPLASTY Left 02/11/2015  Procedure: LEFT TOTAL KNEE ARTHROPLASTY, RIGHT KNEE CORTISONE INJECTION;  Surgeon: Dempsey Moan, MD;  Location: [facility] ORS;  Service: Orthopedics;  Laterality: Left;   TOTAL KNEE ARTHROPLASTY Right 10/05/2016   Procedure: RIGHT TOTAL KNEE ARTHROPLASTY;  Surgeon: Dempsey Moan, MD;  Location: [facility] ORS;  Service: Orthopedics;  Laterality: Right;   VESICOVAGINAL FISTULA CLOSURE W/ TAH  11/30/1985     HPI  from the history and physical done on the day of admission:   [name] is a 78 y.o. female with medical history significant for GERD, essential hypertension, osteoarthritis, pulmonary fibrosis, Raynaud's disease, rheumatoid arthritis, scleroderma, seasonal allergies and diastolic CHF, who presented to the emergency room with acute onset of significant  anorexia over the last week with lower abdominal discomfort and back pain.  She denies frequency and urgency or dysuria but admits to bilateral flank pain.  No cough or wheezing or dyspnea.  No chest pain or palpitations.  She admits to fever and chills.  She has been having vaginal itching in the ER.  No bleeding diathesis.   ED Course: When she came to the ER, vital signs were within normal.  Labs revealed mild hyponatremia 132 and hypochloremia of 90 with hypokalemia of 3.3.  Blood glucose was 185 and calcium  8.5.  Total bili was 1.3.  Lactic acid was 2 mm 1.4.  CBC showed leukocytosis 17.5 with neutrophilia as well as hemoconcentration.  Respiratory panel came back negative.  UA was positive for UTI.  Blood and urine culture was sent. EKG as reviewed by me :  Sinus rhythm with occasional PVCs and PACs, right bundle branch block and T wave inversion anterolaterally.  EKG showed Imaging: Abdominal pelvic CT scan with contrast revealed the following: 1. No acute localizing process in the chest, abdomen or pelvis. 2. Stable fibrotic changes in the lungs. 3. Stable right thyroid nodule. Thyroid ultrasound recommended if not performed previously. 4. Fatty infiltration of the liver. 5. Nonobstructing bilateral renal calculi. 6. Sigmoid colon diverticulosis. 7.  Aortic atherosclerosis.   The patient was given 2 L bolus of IV normal saline, 4 mg of IV morphine  sulfate and 4 mg of IV Zofran  as well as 1 g of IV Rocephin .  She will be admitted to a medical telemetry bed for further evaluation and management.    Hospital Course:     Brief Narrative:  78 y.o. female with medical history significant for GERD, essential hypertension, osteoarthritis, pulmonary fibrosis, Raynaud's disease, rheumatoid arthritis, scleroderma, seasonal allergies and diastolic CHF admitted on 07/10/2024 with presumed sepsis secondary to UTI     -Assessment and Plan: 1)Sepsis due to E. coli and Klebsiella UTI ---POA T max  100.6, HR 95 , RR 35--patient met sepsis criteria on admission -- WBC 17.5 >> 20.4 >>15.0 -CT chest, abdomen and pelvis without acute findings --Treated with Rocephin  , okay to discharge on cefadroxil  -- Steroid therapy may be contributing to leukocytosis   2)Rheumatoid arthritis/scleroderma/Raynaud's disease - continue Plaquenil  and Prednisone    GERD without esophagitis - c/n Protonix    History of breast cancer -continue Arimidex .   HTN -c/n amlodipine    Hypokalemia--replace and recheck Magnesium  WNL   Chronic steroid therapy due to rheumatoid arthritis/scleroderma---  --AM cortisol level 23.9 - Continue prednisone    Diarrhea--check stool for C. difficile and GI pathogen/culture NGTD Received IVF   Fatty Liver--CT abdomen and pelvis showed fatty liver infiltration -Follow-up PCP as outpatient  Discharge Condition: stable  Follow UP   Follow-up Information     Sheryle Carwin,  MD. Schedule an appointment as soon as possible for a visit in 1 week(s).   Specialty: Internal Medicine Contact information: [address] [place] [address] [phone]                 Diet and Activity recommendation:  As advised  Discharge Instructions    Discharge Instructions     Call MD for:  difficulty breathing, headache or visual disturbances   Complete by: As directed    Call MD for:  persistant dizziness or light-headedness   Complete by: As directed    Call MD for:  persistant nausea and vomiting   Complete by: As directed    Call MD for:  temperature >100.4   Complete by: As directed    Diet - low sodium heart healthy   Complete by: As directed    Discharge instructions   Complete by: As directed    1)Please call [phone] on Saturday 07/15/24 and ask for Dr. Elidia Arrien to get Final Report on your Urine Culture and to see if we need to change your antibiotic   2)Drink fluids and avoid dehydration  3)Avoid ibuprofen/Advil/Aleve/Motrin/Goody  Powders/Naproxen/BC powders/Meloxicam/Diclofenac/Indomethacin and other Nonsteroidal anti-inflammatory medications as these will make you more likely to bleed and can cause stomach ulcers, can also cause Kidney problems.   Increase activity slowly   Complete by: As directed         Discharge Medications     Allergies as of 07/13/2024       Reactions   Codeine    Neurological problems - hallucinations, spacey        Medication List     TAKE these medications    acetaminophen  500 MG tablet Commonly known as: TYLENOL  Take 1,000 mg by mouth every 6 (six) hours as needed for moderate pain.   albuterol  108 (90 Base) MCG/ACT inhaler Commonly known as: VENTOLIN  HFA INHALE 2 PUFFS INTO THE LUNGS EVERY 6 (SIX) HOURS AS NEEDED FOR WHEEZING.   amLODipine  5 MG tablet Commonly known as: NORVASC  Take 5 mg by mouth in the morning.   anastrozole  1 MG tablet Commonly known as: ARIMIDEX  Take 1 tablet (1 mg total) by mouth daily.   aspirin  EC 81 MG tablet Take 81 mg by mouth at bedtime.   azelastine  0.1 % nasal spray Commonly known as: ASTELIN  Place 1 spray into both nostrils 2 (two) times daily as needed for rhinitis. Use in each nostril as directed   cefadroxil  500 MG capsule Commonly known as: DURICEF Take 1 capsule (500 mg total) by mouth 2 (two) times daily for 5 days.   furosemide  40 MG tablet Commonly known as: LASIX  TAKE 2 TABLETS EVERY DAY What changed:  how much to take how to take this when to take this additional instructions   hydroxychloroquine  200 MG tablet Commonly known as: PLAQUENIL  Take 400 mg by mouth in the morning.   ipratropium 0.03 % nasal spray Commonly known as: ATROVENT  Place 2 sprays into both nostrils every 12 (twelve) hours.   nortriptyline 10 MG capsule Commonly known as: PAMELOR Take 30 mg by mouth at bedtime.   pantoprazole  40 MG tablet Commonly known as: PROTONIX  Take 40 mg by mouth 2 (two) times daily.   predniSONE  5 MG  tablet Commonly known as: DELTASONE  Take 5 mg by mouth in the morning.   zolpidem  10 MG tablet Commonly known as: AMBIEN  Take 10 mg by mouth at bedtime.       Major procedures and Radiology  Reports - PLEASE review detailed and final reports for all details, in brief -   CT CHEST ABDOMEN PELVIS W CONTRAST Result Date: 07/10/2024 CLINICAL DATA:  Sepsis. EXAM: CT CHEST, ABDOMEN, AND PELVIS WITH CONTRAST TECHNIQUE: Multidetector CT imaging of the chest, abdomen and pelvis was performed following the standard protocol during bolus administration of intravenous contrast. RADIATION DOSE REDUCTION: This exam was performed according to the departmental dose-optimization program which includes automated exposure control, adjustment of the mA and/or kV according to patient size and/or use of iterative reconstruction technique. CONTRAST:  OMNIPAQUE  IOHEXOL  300 MG/ML  SOLN COMPARISON:  CT chest 05/03/2024.  CT abdomen and pelvis 02/05/2021. FINDINGS: CT CHEST FINDINGS Cardiovascular: Heart is mildly enlarged. Aorta is normal in size. There are atherosclerotic calcifications of the aorta. There is no pericardial effusion. Mediastinum/Nodes: There are no enlarged mediastinal or hilar lymph nodes identified. Visualized esophagus is within normal limits. There is a heterogeneous 2.5 cm right thyroid nodule, grossly unchanged. Lungs/Pleura: Fibrotic changes are again seen in both lungs most prominent in the right middle lobe, lingula and lung bases. There is been no significant interval change. There is no new focal lung infiltrate, pleural effusion or pneumothorax. Musculoskeletal: There surgical clips and scarring in the left breast, unchanged. No acute bony abnormality identified. Degenerative changes affect the spine. Right shoulder arthroplasty is present. CT ABDOMEN PELVIS FINDINGS Hepatobiliary: There is diffuse fatty infiltration of the liver. There is enlargement of the caudate lobe, unchanged. No focal  liver lesion identified. Gallbladder is surgically absent. There is no biliary ductal dilatation. Pancreas: Unremarkable. No pancreatic ductal dilatation or surrounding inflammatory changes. Spleen: Normal in size without focal abnormality. Adrenals/Urinary Tract: There is a 2 mm calculus in each kidney. There is no hydronephrosis or perinephric fat stranding. The adrenal glands and bladder are within normal limits. Stomach/Bowel: Stomach is within normal limits. Appendix appears normal. No evidence of bowel wall thickening, distention, or inflammatory changes. There is scattered sigmoid colon diverticula. Vascular/Lymphatic: Aortic atherosclerosis. No enlarged abdominal or pelvic lymph nodes. Reproductive: Status post hysterectomy. No adnexal masses. Other: There is a small fat containing umbilical hernia. No ascites. Musculoskeletal: Bones are osteopenic. There is mild chronic appearing compression deformity of L4. IMPRESSION: 1. No acute localizing process in the chest, abdomen or pelvis. 2. Stable fibrotic changes in the lungs. 3. Stable right thyroid nodule. Thyroid ultrasound recommended if not performed previously. 4. Fatty infiltration of the liver. 5. Nonobstructing bilateral renal calculi. 6. Sigmoid colon diverticulosis. Aortic Atherosclerosis (ICD10-I70.0). Electronically Signed   By: Greig Pique M.D.   On: 07/10/2024 21:10   US  Abdomen Limited RUQ (LIVER/GB) Result Date: 06/25/2024 CLINICAL DATA:  History of prior cholecystectomy sent to evaluate for the presence of cirrhosis. EXAM: ULTRASOUND ABDOMEN LIMITED RIGHT UPPER QUADRANT COMPARISON:  May 31, 2008 FINDINGS: Gallbladder: The gallbladder is surgically absent. Common bile duct: Diameter: 3.5 mm Liver: No focal lesion identified. The liver parenchyma is heterogeneous in appearance and diffusely increased in echogenicity. Portal vein is patent on color Doppler imaging with normal direction of blood flow towards the liver. Other: None.  IMPRESSION: 1. Findings consistent with prior cholecystectomy. 2. Evidence of hepatic cirrhosis and hepatic steatosis. Electronically Signed   By: Suzen Dials M.D.   On: 06/25/2024 23:34   ECHOCARDIOGRAM COMPLETE Result Date: 06/13/2024    ECHOCARDIOGRAM REPORT   Patient Name:   KIMMY TOTTEN Date of Exam: 06/13/2024 Medical Rec #:  [id number]         Height:  63.0 in Accession #:    [id number]        Weight:       182.6 lb Date of Birth:  Aug 28, 1946         BSA:          1.860 m Patient Age:    77 years          BP:           119/64 mmHg Patient Gender: F                 HR:           85 bpm. Exam Location:  [facility] Procedure: 2D Echo, Cardiac Doppler and Color Doppler (Both Spectral and Color            Flow Doppler were utilized during procedure). Indications:     DOE (dyspnea on exertion) [242095]                  Aortic valve sclerosis [320910]  History:         Patient has prior history of Echocardiogram examinations, most                  recent 07/14/2022. Arrythmias:RBBB; Risk Factors:Hypertension.                  Hx of breast cancer.  Sonographer:     Aida Pizza RCS Referring Phys:  [id number] XJUOBW D WEST Diagnosing Phys: Dorn Ross MD IMPRESSIONS  1. Left ventricular ejection fraction, by estimation, is 65 to 70%. The left ventricle has normal function. The left ventricle has no regional wall motion abnormalities. There is moderate left ventricular hypertrophy. Left ventricular diastolic parameters are consistent with Grade I diastolic dysfunction (impaired relaxation).  2. Right ventricular systolic function is normal. The right ventricular size is normal.  3. The mitral valve is normal in structure. Trivial mitral valve regurgitation. No evidence of mitral stenosis.  4. The aortic valve is tricuspid. There is mild calcification of the aortic valve. There is mild thickening of the aortic valve. Aortic valve regurgitation is not visualized. No aortic stenosis is present.  5. The  inferior vena cava is normal in size with greater than 50% respiratory variability, suggesting right atrial pressure of 3 mmHg. FINDINGS  Left Ventricle: Left ventricular ejection fraction, by estimation, is 65 to 70%. The left ventricle has normal function. The left ventricle has no regional wall motion abnormalities. The left ventricular internal cavity size was normal in size. There is  moderate left ventricular hypertrophy. Left ventricular diastolic parameters are consistent with Grade I diastolic dysfunction (impaired relaxation). Normal left ventricular filling pressure. Right Ventricle: The right ventricular size is normal. Right vetricular wall thickness was not well visualized. Right ventricular systolic function is normal. Left Atrium: Left atrial size was normal in size. Right Atrium: Right atrial size was normal in size. Pericardium: There is no evidence of pericardial effusion. Mitral Valve: The mitral valve is normal in structure. Trivial mitral valve regurgitation. No evidence of mitral valve stenosis. Tricuspid Valve: The tricuspid valve is normal in structure. Tricuspid valve regurgitation is not demonstrated. No evidence of tricuspid stenosis. Aortic Valve: The aortic valve is tricuspid. There is mild calcification of the aortic valve. There is mild thickening of the aortic valve. There is mild aortic valve annular calcification. Aortic valve regurgitation is not visualized. No aortic stenosis  is present. Aortic valve mean gradient measures 8.0 mmHg. Aortic valve peak gradient measures 14.7  mmHg. Aortic valve area, by VTI measures 2.04 cm. Pulmonic Valve: The pulmonic valve was not well visualized. Pulmonic valve regurgitation is not visualized. No evidence of pulmonic stenosis. Aorta: The aortic root is normal in size and structure. Venous: The inferior vena cava is normal in size with greater than 50% respiratory variability, suggesting right atrial pressure of 3 mmHg. IAS/Shunts: No atrial  level shunt detected by color flow Doppler.  LEFT VENTRICLE PLAX 2D LVIDd:         4.10 cm   Diastology LVIDs:         2.30 cm   LV e' medial:    6.96 cm/s LV PW:         1.30 cm   LV E/e' medial:  10.8 LV IVS:        1.20 cm   LV e' lateral:   9.57 cm/s LVOT diam:     1.80 cm   LV E/e' lateral: 7.9 LV SV:         82 LV SV Index:   44 LVOT Area:     2.54 cm  RIGHT VENTRICLE RV S prime:     11.90 cm/s TAPSE (M-mode): 2.2 cm LEFT ATRIUM             Index        RIGHT ATRIUM          Index LA diam:        3.90 cm 2.10 cm/m   RA Area:     9.62 cm LA Vol (A2C):   39.6 ml 21.29 ml/m  RA Volume:   18.10 ml 9.73 ml/m LA Vol (A4C):   51.7 ml 27.79 ml/m LA Biplane Vol: 47.7 ml 25.64 ml/m  AORTIC VALVE AV Area (Vmax):    2.15 cm AV Area (Vmean):   2.15 cm AV Area (VTI):     2.04 cm AV Vmax:           192.00 cm/s AV Vmean:          129.000 cm/s AV VTI:            0.402 m AV Peak Grad:      14.7 mmHg AV Mean Grad:      8.0 mmHg LVOT Vmax:         162.00 cm/s LVOT Vmean:        109.000 cm/s LVOT VTI:          0.322 m LVOT/AV VTI ratio: 0.80  AORTA Ao Root diam: 3.20 cm MITRAL VALVE MV Area (PHT): 3.42 cm     SHUNTS MV Decel Time: 222 msec     Systemic VTI:  0.32 m MV E velocity: 75.50 cm/s   Systemic Diam: 1.80 cm MV A velocity: 114.00 cm/s MV E/A ratio:  0.66 Dorn Ross MD Electronically signed by Dorn Ross MD Signature Date/Time: 06/13/2024/5:21:12 PM    Final (Updated)     Micro Results  Recent Results (from the past 240 hours)  Resp panel by RT-PCR (RSV, Flu A&B, Covid) Anterior Nasal Swab     Status: None   Collection Time: 07/10/24  5:37 PM   Specimen: Anterior Nasal Swab  Result Value Ref Range Status   SARS Coronavirus 2 by RT PCR NEGATIVE NEGATIVE Final    Comment: (NOTE) SARS-CoV-2 target nucleic acids are NOT DETECTED.  The SARS-CoV-2 RNA is generally detectable in upper respiratory specimens during the acute phase of infection. The lowest concentration of SARS-CoV-2 viral copies  this assay  can detect is 138 copies/mL. A negative result does not preclude SARS-Cov-2 infection and should not be used as the sole basis for treatment or other patient management decisions. A negative result may occur with  improper specimen collection/handling, submission of specimen other than nasopharyngeal swab, presence of viral mutation(s) within the areas targeted by this assay, and inadequate number of viral copies(<138 copies/mL). A negative result must be combined with clinical observations, patient history, and epidemiological information. The expected result is Negative.  Fact Sheet for Patients:  BloggerCourse.com  Fact Sheet for Healthcare Providers:  SeriousBroker.it  This test is no t yet approved or cleared by the [place]  FDA and  has been authorized for detection and/or diagnosis of SARS-CoV-2 by FDA under an Emergency Use Authorization (EUA). This EUA will remain  in effect (meaning this test can be used) for the duration of the COVID-19 declaration under Section 564(b)(1) of the Act, 21 U.S.C.section 360bbb-3(b)(1), unless the authorization is terminated  or revoked sooner.       Influenza A by PCR NEGATIVE NEGATIVE Final   Influenza B by PCR NEGATIVE NEGATIVE Final    Comment: (NOTE) The Xpert Xpress SARS-CoV-2/FLU/RSV plus assay is intended as an aid in the diagnosis of influenza from Nasopharyngeal swab specimens and should not be used as a sole basis for treatment. Nasal washings and aspirates are unacceptable for Xpert Xpress SARS-CoV-2/FLU/RSV testing.  Fact Sheet for Patients: BloggerCourse.com  Fact Sheet for Healthcare Providers: SeriousBroker.it  This test is not yet approved or cleared by the [place]  FDA and has been authorized for detection and/or diagnosis of SARS-CoV-2 by FDA under an Emergency Use Authorization (EUA). This EUA  will remain in effect (meaning this test can be used) for the duration of the COVID-19 declaration under Section 564(b)(1) of the Act, 21 U.S.C. section 360bbb-3(b)(1), unless the authorization is terminated or revoked.     Resp Syncytial Virus by PCR NEGATIVE NEGATIVE Final    Comment: (NOTE) Fact Sheet for Patients: BloggerCourse.com  Fact Sheet for Healthcare Providers: SeriousBroker.it  This test is not yet approved or cleared by the [place]  FDA and has been authorized for detection and/or diagnosis of SARS-CoV-2 by FDA under an Emergency Use Authorization (EUA). This EUA will remain in effect (meaning this test can be used) for the duration of the COVID-19 declaration under Section 564(b)(1) of the Act, 21 U.S.C. section 360bbb-3(b)(1), unless the authorization is terminated or revoked.  Performed at [facility], [address], [place], [place] [address]   Urine Culture     Status: Abnormal (Preliminary result)   Collection Time: 07/10/24  7:28 PM   Specimen: Urine, Random  Result Value Ref Range Status   Specimen Description   Final    URINE, RANDOM Performed at [facility], [address], [place], [place] [address]    Special Requests   Final    NONE Performed at [facility], [address] [place], [place] [address]    Culture (A)  Final    >=100,000 COLONIES/mL ESCHERICHIA COLI 50,000 COLONIES/mL KLEBSIELLA PNEUMONIAE SUSCEPTIBILITIES TO FOLLOW Performed at [facility], [address] [place], [place] [address]    Report Status PENDING  Incomplete  Culture, blood (single)     Status: None (Preliminary result)   Collection Time: 07/10/24  9:52 PM   Specimen: BLOOD  Result Value Ref Range Status   Specimen Description BLOOD LEFT ANTECUBITAL  Final   Special Requests   Final    BOTTLES DRAWN  AEROBIC AND ANAEROBIC Blood Culture adequate volume   Culture   Final    NO GROWTH 3 DAYS Performed at  [facility], [address], [place], [place] [address]    Report Status PENDING  Incomplete  C Difficile Quick Screen w PCR reflex     Status: None   Collection Time: 07/12/24  1:51 PM   Specimen: STOOL  Result Value Ref Range Status   C Diff antigen NEGATIVE NEGATIVE Final   C Diff toxin NEGATIVE NEGATIVE Final   C Diff interpretation No C. difficile detected.  Final    Comment: Performed at [facility], [address], [place], [place] [address]  Gastrointestinal Panel by PCR , Stool     Status: None   Collection Time: 07/12/24  1:51 PM   Specimen: STOOL  Result Value Ref Range Status   Campylobacter species NOT DETECTED NOT DETECTED Final   Plesimonas shigelloides NOT DETECTED NOT DETECTED Final   Salmonella species NOT DETECTED NOT DETECTED Final   Yersinia enterocolitica NOT DETECTED NOT DETECTED Final   Vibrio species NOT DETECTED NOT DETECTED Final   Vibrio cholerae NOT DETECTED NOT DETECTED Final   Enteroaggregative E coli (EAEC) NOT DETECTED NOT DETECTED Final   Enteropathogenic E coli (EPEC) NOT DETECTED NOT DETECTED Final   Enterotoxigenic E coli (ETEC) NOT DETECTED NOT DETECTED Final   Shiga like toxin producing E coli (STEC) NOT DETECTED NOT DETECTED Final   Shigella/Enteroinvasive E coli (EIEC) NOT DETECTED NOT DETECTED Final   Cryptosporidium NOT DETECTED NOT DETECTED Final   Cyclospora cayetanensis NOT DETECTED NOT DETECTED Final   Entamoeba histolytica NOT DETECTED NOT DETECTED Final   Giardia lamblia NOT DETECTED NOT DETECTED Final   Adenovirus F40/41 NOT DETECTED NOT DETECTED Final   Astrovirus NOT DETECTED NOT DETECTED Final   Norovirus GI/GII NOT DETECTED NOT DETECTED Final   Rotavirus A NOT DETECTED NOT DETECTED Final   Sapovirus (I, II, IV, and V) NOT DETECTED NOT DETECTED Final    Comment: Performed at [facility], [address] [place], [place] [address]    Today   Subjective    [name] today has no new complaints  - No  further diarrhea No fever  Or chills.         Patient has been seen and examined prior to discharge   Objective   Blood pressure (!) 122/44, pulse 81, temperature 98.3 F (36.8 C), temperature source Oral, resp. rate 16, height 5' 3 (1.6 m), weight 77.1 kg, SpO2 95%.   Intake/Output Summary (Last 24 hours) at 07/13/2024 1234 Last data filed at 07/13/2024 0930 Gross per 24 hour  Intake 930.27 ml  Output --  Net 930.27 ml   Exam Gen:- Awake Alert, no acute distress HEENT:- [place].AT, No sclera icterus Neck-Supple Neck,No JVD,.  Lungs- -Velcro- type rales consistent with patient's established history of pulmonary fibrosis, air movement is fair CV- S1, S2 normal, regular Abd-  +ve B.Sounds, Abd Soft, No tenderness, No CVA    Extremity/Skin:- No  edema,   good pulses Psych-affect is appropriate, oriented x3 Neuro-no new focal deficits, no tremors    Data Review   CBC w Diff:  Lab Results  Component Value Date   WBC 15.0 (H) 07/13/2024   HGB 13.9 07/13/2024   HGB 16.0 (H) 01/12/2023   HCT 41.3 07/13/2024   PLT 350 07/13/2024   PLT 302 01/12/2023   LYMPHOPCT 9 07/10/2024   MONOPCT 8 07/10/2024   EOSPCT 0 07/10/2024  BASOPCT 1 07/10/2024   CMP:  Lab Results  Component Value Date   NA 137 07/13/2024   NA 140 05/18/2024   K 3.7 07/13/2024   CL 99 07/13/2024   CO2 31 07/13/2024   BUN 9 07/13/2024   BUN 13 05/18/2024   CREATININE 0.69 07/13/2024   CREATININE 0.83 09/30/2022   CREATININE 0.76 08/14/2014   PROT 7.5 07/10/2024   PROT 6.7 05/18/2024   ALBUMIN 2.5 (L) 07/13/2024   ALBUMIN 4.2 05/18/2024   BILITOT 1.3 (H) 07/10/2024   BILITOT 0.9 05/18/2024   BILITOT 0.9 09/30/2022   ALKPHOS 87 07/10/2024   AST 30 07/10/2024   AST 29 09/30/2022   ALT 18 07/10/2024   ALT 24 09/30/2022   Total Discharge time is about 33 minutes  Rendall Carwin M.D on 07/13/2024 at 12:34 PM  Go to [email or web address] -  for contact info  [facility] - Office  [phone]

## 2024-07-13 NOTE — Discharge Instructions (Signed)
 1)Please call [phone] on Saturday 07/15/24 and ask for Dr. Elidia Arrien to get Final Report on your Urine Culture and to see if we need to change your antibiotic   2)Drink fluids and avoid dehydration  3)Avoid ibuprofen/Advil/Aleve/Motrin/Goody Powders/Naproxen/BC powders/Meloxicam/Diclofenac/Indomethacin and other Nonsteroidal anti-inflammatory medications as these will make you more likely to bleed and can cause stomach ulcers, can also cause Kidney problems.

## 2024-07-13 NOTE — Plan of Care (Signed)
  Problem: Clinical Measurements: Goal: Ability to maintain clinical measurements within normal limits will improve Outcome: Progressing Goal: Will remain free from infection Outcome: Progressing Goal: Diagnostic test results will improve Outcome: Progressing Goal: Respiratory complications will improve Outcome: Progressing Goal: Cardiovascular complication will be avoided Outcome: Progressing   Problem: Elimination: Goal: Will not experience complications related to bowel motility Outcome: Progressing Goal: Will not experience complications related to urinary retention Outcome: Progressing   Problem: Safety: Goal: Ability to remain free from injury will improve Outcome: Progressing   Problem: Skin Integrity: Goal: Risk for impaired skin integrity will decrease Outcome: Progressing   Problem: Fluid Volume: Goal: Hemodynamic stability will improve Outcome: Progressing   Problem: Clinical Measurements: Goal: Diagnostic test results will improve Outcome: Progressing Goal: Signs and symptoms of infection will decrease Outcome: Progressing   Problem: Respiratory: Goal: Ability to maintain adequate ventilation will improve Outcome: Progressing

## 2024-07-15 LAB — CULTURE, BLOOD (SINGLE)
Culture: NO GROWTH
Special Requests: ADEQUATE

## 2024-07-17 ENCOUNTER — Ambulatory Visit: Payer: Self-pay | Admitting: Internal Medicine

## 2024-07-17 NOTE — Progress Notes (Signed)
 Cirrhosis of liver in US . She should talk to PCP Sheryle Carwin, MD about thyis

## 2024-07-18 ENCOUNTER — Ambulatory Visit: Admitting: Cardiology

## 2024-07-18 LAB — URINE CULTURE: Culture: >=100000 — AB

## 2024-07-20 ENCOUNTER — Emergency Department (HOSPITAL_COMMUNITY)

## 2024-07-20 ENCOUNTER — Inpatient Hospital Stay (HOSPITAL_COMMUNITY)
Admission: EM | Admit: 2024-07-20 | Discharge: 2024-07-22 | DRG: 872 | Disposition: A | Discharge status: Home / Self Care | Source: Ambulatory Visit | Attending: Family Medicine | Admitting: Family Medicine

## 2024-07-20 ENCOUNTER — Encounter: Payer: Self-pay | Admitting: Cardiology

## 2024-07-20 ENCOUNTER — Other Ambulatory Visit: Payer: Self-pay

## 2024-07-20 ENCOUNTER — Encounter (HOSPITAL_COMMUNITY): Payer: Self-pay | Admitting: Emergency Medicine

## 2024-07-20 ENCOUNTER — Ambulatory Visit: Attending: Cardiology | Admitting: Cardiology

## 2024-07-20 VITALS — BP 136/88 | HR 100 | Ht 63.0 in | Wt 168.0 lb

## 2024-07-20 DIAGNOSIS — Z1152 Encounter for screening for COVID-19: Secondary | ICD-10-CM | POA: Diagnosis not present

## 2024-07-20 DIAGNOSIS — Z79811 Long term (current) use of aromatase inhibitors: Secondary | ICD-10-CM

## 2024-07-20 DIAGNOSIS — E041 Nontoxic single thyroid nodule: Secondary | ICD-10-CM | POA: Diagnosis present

## 2024-07-20 DIAGNOSIS — I2510 Atherosclerotic heart disease of native coronary artery without angina pectoris: Secondary | ICD-10-CM

## 2024-07-20 DIAGNOSIS — E872 Acidosis, unspecified: Secondary | ICD-10-CM | POA: Diagnosis present

## 2024-07-20 DIAGNOSIS — I358 Other nonrheumatic aortic valve disorders: Secondary | ICD-10-CM

## 2024-07-20 DIAGNOSIS — Z66 Do not resuscitate: Secondary | ICD-10-CM | POA: Diagnosis present

## 2024-07-20 DIAGNOSIS — C50919 Malignant neoplasm of unspecified site of unspecified female breast: Secondary | ICD-10-CM | POA: Diagnosis not present

## 2024-07-20 DIAGNOSIS — R000 Tachycardia, unspecified: Secondary | ICD-10-CM

## 2024-07-20 DIAGNOSIS — M349 Systemic sclerosis, unspecified: Secondary | ICD-10-CM | POA: Diagnosis present

## 2024-07-20 DIAGNOSIS — I4510 Unspecified right bundle-branch block: Secondary | ICD-10-CM | POA: Diagnosis not present

## 2024-07-20 DIAGNOSIS — R0602 Shortness of breath: Secondary | ICD-10-CM | POA: Diagnosis present

## 2024-07-20 DIAGNOSIS — Z96611 Presence of right artificial shoulder joint: Secondary | ICD-10-CM | POA: Diagnosis present

## 2024-07-20 DIAGNOSIS — R6510 Systemic inflammatory response syndrome (SIRS) of non-infectious origin without acute organ dysfunction: Secondary | ICD-10-CM

## 2024-07-20 DIAGNOSIS — Z9049 Acquired absence of other specified parts of digestive tract: Secondary | ICD-10-CM | POA: Diagnosis not present

## 2024-07-20 DIAGNOSIS — M069 Rheumatoid arthritis, unspecified: Secondary | ICD-10-CM | POA: Diagnosis present

## 2024-07-20 DIAGNOSIS — Z8379 Family history of other diseases of the digestive system: Secondary | ICD-10-CM | POA: Diagnosis not present

## 2024-07-20 DIAGNOSIS — Z7952 Long term (current) use of systemic steroids: Secondary | ICD-10-CM

## 2024-07-20 DIAGNOSIS — I10 Essential (primary) hypertension: Secondary | ICD-10-CM | POA: Diagnosis present

## 2024-07-20 DIAGNOSIS — I7300 Raynaud's syndrome without gangrene: Secondary | ICD-10-CM | POA: Diagnosis present

## 2024-07-20 DIAGNOSIS — M1990 Unspecified osteoarthritis, unspecified site: Secondary | ICD-10-CM | POA: Diagnosis present

## 2024-07-20 DIAGNOSIS — A4159 Other Gram-negative sepsis: Secondary | ICD-10-CM | POA: Diagnosis not present

## 2024-07-20 DIAGNOSIS — I5032 Chronic diastolic (congestive) heart failure: Secondary | ICD-10-CM

## 2024-07-20 DIAGNOSIS — Z96653 Presence of artificial knee joint, bilateral: Secondary | ICD-10-CM | POA: Diagnosis present

## 2024-07-20 DIAGNOSIS — Z885 Allergy status to narcotic agent status: Secondary | ICD-10-CM

## 2024-07-20 DIAGNOSIS — K219 Gastro-esophageal reflux disease without esophagitis: Secondary | ICD-10-CM | POA: Diagnosis present

## 2024-07-20 DIAGNOSIS — Z87891 Personal history of nicotine dependence: Secondary | ICD-10-CM

## 2024-07-20 DIAGNOSIS — E876 Hypokalemia: Secondary | ICD-10-CM | POA: Diagnosis not present

## 2024-07-20 DIAGNOSIS — J8410 Pulmonary fibrosis, unspecified: Secondary | ICD-10-CM | POA: Diagnosis present

## 2024-07-20 DIAGNOSIS — A4151 Sepsis due to Escherichia coli [E. coli]: Principal | ICD-10-CM | POA: Diagnosis present

## 2024-07-20 DIAGNOSIS — B961 Klebsiella pneumoniae [K. pneumoniae] as the cause of diseases classified elsewhere: Secondary | ICD-10-CM | POA: Diagnosis present

## 2024-07-20 DIAGNOSIS — Z825 Family history of asthma and other chronic lower respiratory diseases: Secondary | ICD-10-CM

## 2024-07-20 DIAGNOSIS — I110 Hypertensive heart disease with heart failure: Secondary | ICD-10-CM | POA: Diagnosis present

## 2024-07-20 DIAGNOSIS — N390 Urinary tract infection, site not specified: Secondary | ICD-10-CM | POA: Diagnosis present

## 2024-07-20 DIAGNOSIS — Z820 Family history of epilepsy and other diseases of the nervous system: Secondary | ICD-10-CM

## 2024-07-20 DIAGNOSIS — Z853 Personal history of malignant neoplasm of breast: Secondary | ICD-10-CM | POA: Diagnosis not present

## 2024-07-20 DIAGNOSIS — K760 Fatty (change of) liver, not elsewhere classified: Secondary | ICD-10-CM | POA: Diagnosis present

## 2024-07-20 DIAGNOSIS — R739 Hyperglycemia, unspecified: Secondary | ICD-10-CM | POA: Diagnosis not present

## 2024-07-20 DIAGNOSIS — Z823 Family history of stroke: Secondary | ICD-10-CM

## 2024-07-20 DIAGNOSIS — Z8249 Family history of ischemic heart disease and other diseases of the circulatory system: Secondary | ICD-10-CM

## 2024-07-20 DIAGNOSIS — Z79899 Other long term (current) drug therapy: Secondary | ICD-10-CM

## 2024-07-20 DIAGNOSIS — Z7982 Long term (current) use of aspirin: Secondary | ICD-10-CM

## 2024-07-20 DIAGNOSIS — R531 Weakness: Secondary | ICD-10-CM

## 2024-07-20 DIAGNOSIS — A419 Sepsis, unspecified organism: Principal | ICD-10-CM

## 2024-07-20 DIAGNOSIS — K529 Noninfective gastroenteritis and colitis, unspecified: Secondary | ICD-10-CM | POA: Diagnosis not present

## 2024-07-20 DIAGNOSIS — Z90710 Acquired absence of both cervix and uterus: Secondary | ICD-10-CM

## 2024-07-20 DIAGNOSIS — T380X5A Adverse effect of glucocorticoids and synthetic analogues, initial encounter: Secondary | ICD-10-CM | POA: Diagnosis not present

## 2024-07-20 LAB — CBC
HCT: 46.6 % — ABNORMAL HIGH (ref 36.0–46.0)
Hemoglobin: 15.8 g/dL — ABNORMAL HIGH (ref 12.0–15.0)
MCH: 32.4 pg (ref 26.0–34.0)
MCHC: 33.9 g/dL (ref 30.0–36.0)
MCV: 95.7 fL (ref 80.0–100.0)
Platelets: 483 K/uL — ABNORMAL HIGH (ref 150–400)
RBC: 4.87 MIL/uL (ref 3.87–5.11)
RDW: 12.7 % (ref 11.5–15.5)
WBC: 17.1 K/uL — ABNORMAL HIGH (ref 4.0–10.5)
nRBC: 0 % (ref 0.0–0.2)

## 2024-07-20 LAB — URINALYSIS, W/ REFLEX TO CULTURE (INFECTION SUSPECTED)
Bilirubin Urine: NEGATIVE
Glucose, UA: NEGATIVE mg/dL
Hgb urine dipstick: NEGATIVE
Ketones, ur: NEGATIVE mg/dL
Nitrite: NEGATIVE
Protein, ur: NEGATIVE mg/dL
Specific Gravity, Urine: 1.008 (ref 1.005–1.030)
pH: 6 (ref 5.0–8.0)

## 2024-07-20 LAB — BASIC METABOLIC PANEL WITH GFR
Anion gap: 16 — ABNORMAL HIGH (ref 5–15)
BUN: 19 mg/dL (ref 8–23)
CO2: 30 mmol/L (ref 22–32)
Calcium: 9.5 mg/dL (ref 8.9–10.3)
Chloride: 90 mmol/L — ABNORMAL LOW (ref 98–111)
Creatinine, Ser: 1.19 mg/dL — ABNORMAL HIGH (ref 0.44–1.00)
GFR, Estimated: 47 mL/min — ABNORMAL LOW (ref >60)
Glucose, Bld: 203 mg/dL — ABNORMAL HIGH (ref 70–99)
Potassium: 3.6 mmol/L (ref 3.5–5.1)
Sodium: 136 mmol/L (ref 135–145)

## 2024-07-20 LAB — HEPATIC FUNCTION PANEL
ALT: 30 U/L (ref 0–44)
AST: 40 U/L (ref 15–41)
Albumin: 4 g/dL (ref 3.5–5.0)
Alkaline Phosphatase: 101 U/L (ref 38–126)
Bilirubin, Direct: 0.3 mg/dL — ABNORMAL HIGH (ref 0.0–0.2)
Indirect Bilirubin: 1 mg/dL — ABNORMAL HIGH (ref 0.3–0.9)
Total Bilirubin: 1.3 mg/dL — ABNORMAL HIGH (ref 0.0–1.2)
Total Protein: 7.9 g/dL (ref 6.5–8.1)

## 2024-07-20 LAB — RESP PANEL BY RT-PCR (RSV, FLU A&B, COVID)  RVPGX2
Influenza A by PCR: NEGATIVE
Influenza B by PCR: NEGATIVE
Resp Syncytial Virus by PCR: NEGATIVE
SARS Coronavirus 2 by RT PCR: NEGATIVE

## 2024-07-20 LAB — PROTIME-INR
INR: 1 (ref 0.8–1.2)
Prothrombin Time: 14 s (ref 11.4–15.2)

## 2024-07-20 LAB — LACTIC ACID, PLASMA
Lactic Acid, Venous: 2.5 mmol/L (ref 0.5–1.9)
Lactic Acid, Venous: 2.1 mmol/L (ref 0.5–1.9)

## 2024-07-20 MED ORDER — SODIUM CHLORIDE 0.9 % IV SOLN
2.0000 g | Freq: Once | INTRAVENOUS | Status: AC
  Administered 2024-07-20: 2 g via INTRAVENOUS
  Filled 2024-07-20: qty 20

## 2024-07-20 MED ORDER — SODIUM CHLORIDE 0.9 % IV BOLUS
2000.0000 mL | Freq: Once | INTRAVENOUS | Status: AC
  Administered 2024-07-20: 2000 mL via INTRAVENOUS

## 2024-07-20 NOTE — Progress Notes (Signed)
 Cardiology Office Note    Date:  07/20/2024  ID:  [name], Tenbrink 05/13/1946, MRN [id number] PCP:  Sheryle Carwin, MD  Cardiologist:  Redell Shallow, MD  Electrophysiologist:  None   Chief Complaint: Dizziness, lightheadedness, fatigue  History of Present Illness: .    [name] is a 78 y.o. female with visit-pertinent history of diastolic heart failure, hypertension, CREST/scleroderma and ILD, breast cancer, RBBB.   PFTs 4/15 with FEV1 1.54 and DLCO 68% predicted. LHC (03/15/14): RA 7, RV 43/7/9, PA 43/18 (mean 31), PCWP 25, CO 6.9, CI 3.45, no CAD. Placed on lasix . ABIs May 2016 normal. Most recent echocardiogram January 2021 showed normal LV function, moderate left ventricular hypertrophy, grade 1 diastolic dysfunction, mild left atrial enlargement.   Chest CT January 2022 showed pulmonary fibrosis, bronchiectasis consistent with UIP.  MPI in 01/2021 was a normal and low restudy.  Echocardiogram in 06/2022 indicated LVEF of 60 to 65%, no RWMA, G1 DD, RV systolic function and size was normal, normal PASP, no evidence of mitral stenosis, no aortic stenosis present.  Patient was last seen in clinic on 01/25/2023 by [name], NP for preoperative cardiac evaluation.  Patient was noted to have new right bundle branch block and was pending surgery.  It was noted that in November 2023 she had a fall leading to a shoulder surgery and also finding of stage I breast cancer requiring left breast lumpectomy on 11/04/2022.  She was being pulled back for shoulder surgery when case was canceled due to a new right bundle branch block.  She reported that her exertional dyspnea was overall stable which she attributed to her scleroderma and ILD however she did note in recent months she had becoming fatigued easily.  She denied any chest pain, pressure, tightness.  It was recommended that patient undergo cardiac CTA.  Coronary CTA on 02/05/2023 indicated minimal nonobstructive CAD with a coronary calcium   score of 419, this was 85th percentile for age and sex matched control, aortic atherosclerosis was present.  Was recommended that she continue aspirin  81 mg daily and start rosuvastatin  10 mg daily.  Patient was last in clinic on 05/17/2024 for follow-up, reported that she was doing okay.  She did note increased shortness of breath with exertion, question did this was related to her history of ILD.  She did not feel that there been a significant change in her breathing, more gradual decline.  She denied any chest pain on exertion, noted a occasional discomfort between her shoulder blades and slight discomfort under her left breast when sitting in her chair, improves with stretching or moving.  Echocardiogram on 06/13/2024 indicated LVEF 65 to 70%, no regional wall motion abnormalities, moderate LVH, G1 DD, RV systolic function and size was normal, trivial mitral valve regurgitation with no evidence of stenosis, mild calcification of the aortic valve, regurgitation and stenosis not visualized.  On chart review patient was admitted from 07/10/2024 through 07/13/2024 with acute cystitis without hematuria, sepsis due to gram-negative UTI.  Patient was treated with IV antibiotics and discharged on 8/14.  Today she presents for cardiology follow-up.  She reports that she has been feeling horrible the last few days, she continues to have significant fatigue, dizziness, lightheadedness and dyspnea, she does not feel she is improving post hospitalization from sepsis. She notes she has some baseline dyspnea, however following her recent discharge she continues to feel horrible and is unable to tolerate activity. She continues to have severe nausea, denies any significant vomiting.  She has no appetite and has minimal food intake. She notes she has been trying to increase her fluid intake, notes a weight loss of 20 lbs in one month.  ROS: .   Today she denies chest pain, lower extremity edema, palpitations, melena,  hematuria, hemoptysis, diaphoresis, syncope, orthopnea, and PND.  All other systems are reviewed and otherwise negative. Studies Reviewed: SABRA   EKG:  EKG is ordered today, personally reviewed, demonstrating  EKG Interpretation Date/Time:  Thursday July 20 2024 14:37:53 EDT Ventricular Rate:  91 PR Interval:  170 QRS Duration:  162 QT Interval:  462 QTC Calculation: 568 R Axis:   127  Text Interpretation: Normal sinus rhythm Right bundle branch block Left posterior fascicular block Bifascicular block When compared with ECG of 10-Jul-2024 13:11, Premature ventricular complexes are no longer Present Premature atrial complexes are no longer Present T wave inversion no longer evident in Anterolateral leads Confirmed by Ilina Xu [phone]) on 07/20/2024 4:56:36 PM   CV Studies: Cardiac studies reviewed are outlined and summarized above. Otherwise please see EMR for full report. Cardiac Studies & Procedures   ______________________________________________________________________________________________   STRESS TESTS  MYOCARDIAL PERFUSION IMAGING 02/12/2021  Interpretation Summary  Nuclear stress EF: 82%.  There was no ST segment deviation noted during stress.  No T wave inversion was noted during stress.  Normal perfusion with no evidence of ischemia or infarction.  The study is normal.  This is a low risk study.  The left ventricular ejection fraction is hyperdynamic (>65%).  Powell Sorrow, MD   ECHOCARDIOGRAM  ECHOCARDIOGRAM COMPLETE 06/13/2024  Narrative ECHOCARDIOGRAM REPORT    Patient Name:   [name] Date of Exam: 06/13/2024 Medical Rec #:  [id number]         Height:       63.0 in Accession #:    [id number]        Weight:       182.6 lb Date of Birth:  1946-06-13         BSA:          1.860 m Patient Age:    77 years          BP:           119/64 mmHg Patient Gender: F                 HR:           85 bpm. Exam Location:  [facility]  Procedure: 2D  Echo, Cardiac Doppler and Color Doppler (Both Spectral and Color Flow Doppler were utilized during procedure).  Indications:     DOE (dyspnea on exertion) [242095] Aortic valve sclerosis [320910]  History:         Patient has prior history of Echocardiogram examinations, most recent 07/14/2022. Arrythmias:RBBB; Risk Factors:Hypertension. Hx of breast cancer.  Sonographer:     Aida Pizza RCS Referring Phys:  [id number] XJUOBW D Basil Blakesley Diagnosing Phys: Dorn Ross MD  IMPRESSIONS   1. Left ventricular ejection fraction, by estimation, is 65 to 70%. The left ventricle has normal function. The left ventricle has no regional wall motion abnormalities. There is moderate left ventricular hypertrophy. Left ventricular diastolic parameters are consistent with Grade I diastolic dysfunction (impaired relaxation). 2. Right ventricular systolic function is normal. The right ventricular size is normal. 3. The mitral valve is normal in structure. Trivial mitral valve regurgitation. No evidence of mitral stenosis. 4. The aortic valve is tricuspid. There is mild calcification of the aortic valve. There  is mild thickening of the aortic valve. Aortic valve regurgitation is not visualized. No aortic stenosis is present. 5. The inferior vena cava is normal in size with greater than 50% respiratory variability, suggesting right atrial pressure of 3 mmHg.  FINDINGS Left Ventricle: Left ventricular ejection fraction, by estimation, is 65 to 70%. The left ventricle has normal function. The left ventricle has no regional wall motion abnormalities. The left ventricular internal cavity size was normal in size. There is moderate left ventricular hypertrophy. Left ventricular diastolic parameters are consistent with Grade I diastolic dysfunction (impaired relaxation). Normal left ventricular filling pressure.  Right Ventricle: The right ventricular size is normal. Right vetricular wall thickness was not well  visualized. Right ventricular systolic function is normal.  Left Atrium: Left atrial size was normal in size.  Right Atrium: Right atrial size was normal in size.  Pericardium: There is no evidence of pericardial effusion.  Mitral Valve: The mitral valve is normal in structure. Trivial mitral valve regurgitation. No evidence of mitral valve stenosis.  Tricuspid Valve: The tricuspid valve is normal in structure. Tricuspid valve regurgitation is not demonstrated. No evidence of tricuspid stenosis.  Aortic Valve: The aortic valve is tricuspid. There is mild calcification of the aortic valve. There is mild thickening of the aortic valve. There is mild aortic valve annular calcification. Aortic valve regurgitation is not visualized. No aortic stenosis is present. Aortic valve mean gradient measures 8.0 mmHg. Aortic valve peak gradient measures 14.7 mmHg. Aortic valve area, by VTI measures 2.04 cm.  Pulmonic Valve: The pulmonic valve was not well visualized. Pulmonic valve regurgitation is not visualized. No evidence of pulmonic stenosis.  Aorta: The aortic root is normal in size and structure.  Venous: The inferior vena cava is normal in size with greater than 50% respiratory variability, suggesting right atrial pressure of 3 mmHg.  IAS/Shunts: No atrial level shunt detected by color flow Doppler.   LEFT VENTRICLE PLAX 2D LVIDd:         4.10 cm   Diastology LVIDs:         2.30 cm   LV e' medial:    6.96 cm/s LV PW:         1.30 cm   LV E/e' medial:  10.8 LV IVS:        1.20 cm   LV e' lateral:   9.57 cm/s LVOT diam:     1.80 cm   LV E/e' lateral: 7.9 LV SV:         82 LV SV Index:   44 LVOT Area:     2.54 cm   RIGHT VENTRICLE RV S prime:     11.90 cm/s TAPSE (M-mode): 2.2 cm  LEFT ATRIUM             Index        RIGHT ATRIUM          Index LA diam:        3.90 cm 2.10 cm/m   RA Area:     9.62 cm LA Vol (A2C):   39.6 ml 21.29 ml/m  RA Volume:   18.10 ml 9.73 ml/m LA Vol  (A4C):   51.7 ml 27.79 ml/m LA Biplane Vol: 47.7 ml 25.64 ml/m AORTIC VALVE AV Area (Vmax):    2.15 cm AV Area (Vmean):   2.15 cm AV Area (VTI):     2.04 cm AV Vmax:           192.00 cm/s AV Vmean:  129.000 cm/s AV VTI:            0.402 m AV Peak Grad:      14.7 mmHg AV Mean Grad:      8.0 mmHg LVOT Vmax:         162.00 cm/s LVOT Vmean:        109.000 cm/s LVOT VTI:          0.322 m LVOT/AV VTI ratio: 0.80  AORTA Ao Root diam: 3.20 cm  MITRAL VALVE MV Area (PHT): 3.42 cm     SHUNTS MV Decel Time: 222 msec     Systemic VTI:  0.32 m MV E velocity: 75.50 cm/s   Systemic Diam: 1.80 cm MV A velocity: 114.00 cm/s MV E/A ratio:  0.66  Dorn Ross MD Electronically signed by Dorn Ross MD Signature Date/Time: 06/13/2024/5:21:12 PM    Final (Updated)      CT SCANS  CT CORONARY MORPH W/CTA COR W/SCORE 02/05/2023  Addendum 02/11/2023  9:55 AM ADDENDUM REPORT: 02/11/2023 09:52  EXAM: OVER-READ INTERPRETATION  CT CHEST  The following report is an over-read performed by radiologist Dr. Mabel Converse of [facility] on 02/11/2023. This over-read does not include interpretation of cardiac or coronary anatomy or pathology. The coronary CTA interpretation by the cardiologist is attached.  COMPARISON:  08/31/2022  FINDINGS: Heart size is upper limits of normal. No pericardial effusion. The imaged portion of the thoracic aorta is nonaneurysmal. Central pulmonary vasculature is nondilated. No lymphadenopathy within the imaged chest.  Chronic interstitial lung disease, most severely affecting the right middle lobe and lingula, without evidence of significant progression when compared to the previous CT. No pleural effusion or pneumothorax.  Hepatic steatosis. No acute bony abnormality. Postsurgical changes within the left breast with 3.1 cm fluid collection, which may represent a postoperative seroma or hematoma. Correlate with  patient history.  IMPRESSION: 1. Chronic interstitial lung disease, most severely affecting the right middle lobe and lingula, without evidence of significant progression when compared to the previous CT. 2. Postsurgical changes within the left breast with 3.1 cm fluid collection, which may represent a postoperative seroma or hematoma. Correlate with patient history. 3. Hepatic steatosis.   Electronically Signed By: Mabel Converse D.O. On: 02/11/2023 09:52  Narrative HISTORY: Chest pain, nonspecific  EXAM: Cardiac/Coronary CT  TECHNIQUE: The patient was scanned on a [employer].  PROTOCOL: A 120 kV prospective scan was triggered in the descending thoracic aorta at 111 HU's. Axial non-contrast 3 mm slices were carried out through the heart. The data set was analyzed on a dedicated work station and scored using the Agatston method. Gantry rotation speed was 250 msecs and collimation was 0.6 mm. Heart rate was optimized medically and sl NTG was given. The 3D data set was reconstructed in 5% intervals of the 35-75 % of the R-R cycle. Systolic and diastolic phases were analyzed on a dedicated work station using MPR, MIP and VRT modes. The patient received 100mL OMNIPAQUE  IOHEXOL  350 MG/ML SOLN of contrast.  FINDINGS: Coronary calcium  score: The patient's coronary artery calcium  score is 419, which places the patient in the 85th percentile.  Coronary arteries: Normal coronary origins.  Right dominance.  Right Coronary Artery: Normal caliber vessel, gives rise to PDA. Scattered calcified plaque in portions of proximal, mid and distal vessel. Maximum stenosis 1-24%.  Left Main Coronary Artery: Normal caliber vessel. No significant plaque or stenosis.  Left Anterior Descending Coronary Artery: Normal caliber vessel. Proximal vessel with focal calcified  plaque with 1-24% stenosis. Gives rise to two small diagonal branches.  Left Circumflex Artery: Normal  caliber vessel. Proximal vessel with focal calcified plaque with 1-24% stenosis. Gives rise to four small OM branches.  Aorta: Normal size, 32 mm at the mid ascending aorta (level of the PA bifurcation) measured double oblique. Aortic atherosclerosis. No dissection seen in visualized portions of the aorta.  Aortic Valve: No calcifications. Trileaflet.  Other findings:  Normal pulmonary vein drainage into the left atrium.  Normal left atrial appendage without a thrombus.  Normal size of the pulmonary artery.  Normal appearance of the pericardium.  Elevated right hemidiaphragm  IMPRESSION: 1.  Minimal nonobstructive CAD, CADRADS = 1.  2. Coronary calcium  score of 419. This was 85th percentile for age and sex matched control.  3. Normal coronary origin with right dominance.  4.  Aortic atherosclerosis.  5.  Elevated right hemidiaphragm  INTERPRETATION:  CAD-RADS 1: Minimal non-obstructive CAD (1-24%). Consider non-atherosclerotic causes of chest pain. Consider preventive therapy and risk factor modification.  Electronically Signed: By: Shelda Bruckner M.D. On: 02/08/2023 17:26     ______________________________________________________________________________________________       Current Reported Medications:.    Current Meds  Medication Sig   acetaminophen  (TYLENOL ) 500 MG tablet Take 1,000 mg by mouth every 6 (six) hours as needed for moderate pain.   albuterol  (VENTOLIN  HFA) 108 (90 Base) MCG/ACT inhaler INHALE 2 PUFFS INTO THE LUNGS EVERY 6 (SIX) HOURS AS NEEDED FOR WHEEZING.   amLODipine  (NORVASC ) 5 MG tablet Take 5 mg by mouth in the morning.   anastrozole  (ARIMIDEX ) 1 MG tablet Take 1 tablet (1 mg total) by mouth daily.   aspirin  EC 81 MG tablet Take 81 mg by mouth at bedtime.   azelastine  (ASTELIN ) 0.1 % nasal spray Place 1 spray into both nostrils 2 (two) times daily as needed for rhinitis. Use in each nostril as directed   furosemide  (LASIX ) 40  MG tablet TAKE 2 TABLETS EVERY DAY (Patient taking differently: Take 80 mg by mouth daily.)   hydroxychloroquine  (PLAQUENIL ) 200 MG tablet Take 400 mg by mouth in the morning.   ipratropium (ATROVENT ) 0.03 % nasal spray Place 2 sprays into both nostrils every 12 (twelve) hours.   nortriptyline (PAMELOR) 10 MG capsule Take 30 mg by mouth at bedtime.   pantoprazole  (PROTONIX ) 40 MG tablet Take 40 mg by mouth 2 (two) times daily.   predniSONE  (DELTASONE ) 5 MG tablet Take 5 mg by mouth in the morning.   zolpidem  (AMBIEN ) 10 MG tablet Take 10 mg by mouth at bedtime.   Physical Exam:    VS:  BP 136/88   Pulse 100   Ht 5' 3 (1.6 m)   Wt 168 lb (76.2 kg)   SpO2 92%   BMI 29.76 kg/m    Wt Readings from Last 3 Encounters:  07/20/24 168 lb (76.2 kg)  07/10/24 170 lb (77.1 kg)  06/08/24 182 lb 9.6 oz (82.8 kg)    GEN: Well nourished, well developed in no acute distress NECK: No JVD; No carotid bruits CARDIAC: RRR, no murmurs, rubs, gallops RESPIRATORY: Crackles in bilateral bases ABDOMEN: Soft, non-tender, non-distended EXTREMITIES:  No edema; No acute deformity     Asessement and Plan:.    Sepsis/Fatigue/Nausea/vomiting: Patient with recent admission for sepsis with cystitis.  Patient reports that she continues to have significant fatigue, low energy, notes that when she stands or try to do activity in her house she becomes dizzy, lightheaded and has a flushing  sensation up her arms up to behind her eyes, notes she feels as though she may pass out.  She denies any true syncope.  She denies any chest pain.  She notes that she has short of breath however feels that this is close to her baseline.  Patient appears pale and fatigued. She does not feel as though she has had much improvement following her recent hospital stay. She continues to have significant nausea, notes decreased food intake, tries to stay hydrated. She is orthostatic on exam today, she will hold her amlodipine . Discussed ED  evaluation with patient, she notes she will consider returning to [facility], she plans to discuss this with her PCP via tele visit.   CAD/RBBB: Coronary CTA on 02/05/2023 indicated minimal nonobstructive CAD with a coronary calcium  score of 419, this was 85th percentile for age and sex matched control, aortic atherosclerosis was present.  Was recommended that she continue aspirin  81 mg daily and start rosuvastatin  10 mg daily.  Today she denies chest pain. Reports dyspnea on exertion at baseline. Continue aspirin  81 mg daily.  Chronic diastolic HF/aortic valve sclerosis: Echocardiogram in 06/2022 indicated LVEF of 60 to 65%, no RWMA, G1 DD, RV systolic function and size was normal, normal PASP, no evidence of mitral stenosis, no aortic stenosis present. Echocardiogram on 06/13/2024 indicated LVEF 65 to 70%, no regional wall motion abnormalities, moderate LVH, G1 DD, RV systolic function and size was normal, trivial mitral valve regurgitation with no evidence of stenosis, mild calcification of the aortic valve, regurgitation and stenosis not visualized.  Today she appears euvolemic, patient with positive orthostatics following recent hospitalization with sepsis.  Please see above.  Hypertension: Blood pressure today 136/88, on recheck was 126/78. She is orthostatic on exam, she will hold her amlodipine  for the next few days. Recommended ED evaluation given recent sepsis.    Disposition: F/u with Hamzeh Tall, NP in 3-4 weeks   Signed, Jaquarius Seder D Nakota Elsen, NP

## 2024-07-20 NOTE — Patient Instructions (Signed)
 Medication Instructions:  No changes *If you need a refill on your cardiac medications before your next appointment, please call your pharmacy*  Lab Work: No labs If you have labs (blood work) drawn today and your tests are completely normal, you will receive your results only by: MyChart Message (if you have MyChart) OR A paper copy in the mail If you have any lab test that is abnormal or we need to change your treatment, we will call you to review the results.  Testing/Procedures: No testing  Follow-Up: At [facility], you and your health needs are our priority.  As part of our continuing mission to provide you with exceptional heart care, our providers are all part of one team.  This team includes your primary Cardiologist (physician) and Advanced Practice Providers or APPs (Physician Assistants and Nurse Practitioners) who all work together to provide you with the care you need, when you need it.  Your next appointment:   3 week(s)  Provider:   Katlyn West, NP   We recommend signing up for the patient portal called MyChart.  Sign up information is provided on this After Visit Summary.  MyChart is used to connect with patients for Virtual Visits (Telemedicine).  Patients are able to view lab/test results, encounter notes, upcoming appointments, etc.  Non-urgent messages can be sent to your provider as well.   To learn more about what you can do with MyChart, go to [email or web address].

## 2024-07-20 NOTE — Progress Notes (Addendum)
 Elink monitoring for the code sepsis protocol.  2300  Notified provider of need to order repeat lactic acid. Order placed.

## 2024-07-20 NOTE — Consult Note (Signed)
 CODE SEPSIS - PHARMACY COMMUNICATION  **Broad-spectrum antimicrobials should be administered within one hour of sepsis diagnosis**  Time Code Sepsis call or page was received: 1916  Antibiotics ordered: Ceftriaxone   Time of first antibiotic administration: 1945  Additional action taken by pharmacy: n/a  If necessary, name of provider/nurse contacted: n/a   Will M. Lenon, PharmD Clinical Pharmacist 07/20/2024 7:32 PM

## 2024-07-20 NOTE — H&P (Incomplete)
 History and Physical    Patient: [name] DOB: 03/02/1946 DOA: 07/20/2024 DOS: the patient was seen and examined on 07/21/2024 PCP: Sheryle Carwin, MD  Patient coming from: Home  Chief Complaint:  Chief Complaint  Patient presents with   Shortness of Breath   HPI: [name] is a 78 y.o. female with medical history significant of GERD, essential hypertension, osteoarthritis, pulmonary fibrosis, Raynaud's disease, rheumatoid arthritis, scleroderma, seasonal allergies and diastolic CHF, who presented to the emergency department after being asked to go to the ED by her cardiologist. Patient was recently admitted from 8/11 to 8/14 due to sepsis secondary to E. coli and Klebsiella UTI and she was treated with Rocephin  and discharged home on cefadroxil .  However, patient states that since she returned home she has continued to feel weak and has been having poor appetite with decreased oral intake.  She went to see her cardiologist today due to sensation of skipping heartbeat/palpitation and after being examined, it was suspected that she continues to have persistent sepsis and was told to go to the ED for further management.  ED Course:  In the emergency department, she was hemodynamically stable.  Workup in the ED showed CBC showed WBC of 17.1, hemoglobin 15.8, hematocrit 46.6, MCV 95.7, platelets 43.  Normal BMP except for chloride of 90, creatinine 1.19 (baseline creatinine at 0.7 to 0.9) and blood glucose of 203.  Lactic acid 2.5.  Urinalysis showed small leukocytes with rare bacteria.  Influenza A, B, SARS-CoV-2, RSV was negative. Chest x-ray showed no evidence of acute chest disease She was treated with IV ceftriaxone , IV hydration was provided. TRH was asked to admit patient  Review of Systems: Review of systems as noted in the HPI. All other systems reviewed and are negative.   Past Medical History:  Diagnosis Date   Cancer [facility])    Left breast cancer    Diastolic congestive heart failure (HCC)    02/05/15  states no fluid in extremities, breathing same as has been   GERD (gastroesophageal reflux disease)    Hepatic steatosis    History of blood transfusion    Hx of cholecystectomy    Hypertension    Interstitial lung disease (HCC)    Osteoarthritis    Pulmonary fibrosis (HCC)    Raynaud disease    Rheumatoid arthritis(714.0)    Right BBB/left ant fasc block    Scleroderma (HCC)    Seasonal allergies    Shortness of breath dyspnea    states normal for her   Systemic sclerosis [facility])    Past Surgical History:  Procedure Laterality Date   ABDOMINAL HYSTERECTOMY     BLADDER SUSPENSION     BREAST BIOPSY  11/03/2022   MM LT RADIOACTIVE SEED LOC MAMMO GUIDE 11/03/2022 [facility] MAMMOGRAPHY   BREAST LUMPECTOMY WITH RADIOACTIVE SEED LOCALIZATION Left 11/04/2022   Procedure: LEFT BREAST LUMPECTOMY WITH RADIOACTIVE SEED LOCALIZATION;  Surgeon: Vanderbilt Ned, MD;  Location: [facility] OR;  Service: General;  Laterality: Left;   CARPAL TUNNEL RELEASE Right 2007   EYE SURGERY Bilateral 2023   cataract w/ IOL   LAPAROSCOPIC CHOLECYSTECTOMY     LEFT HEART CATHETERIZATION WITH CORONARY/GRAFT ANGIOGRAM  03/15/2014   Procedure: LEFT HEART CATHETERIZATION WITH EL BILE;  Surgeon: Lonni JONETTA Cash, MD;  Location: [facility] CATH LAB;  Service: Cardiovascular;;   REVERSE SHOULDER ARTHROPLASTY Right 05/06/2023   Procedure: REVERSE SHOULDER ARTHROPLASTY;  Surgeon: Melita Drivers, MD;  Location: [facility] ORS;  Service: Orthopedics;  Laterality: Right;    TONSILLECTOMY     TOTAL KNEE ARTHROPLASTY Left 02/11/2015   Procedure: LEFT TOTAL KNEE ARTHROPLASTY, RIGHT KNEE CORTISONE INJECTION;  Surgeon: Dempsey Moan, MD;  Location: [facility] ORS;  Service: Orthopedics;  Laterality: Left;   TOTAL KNEE ARTHROPLASTY Right 10/05/2016   Procedure: RIGHT TOTAL KNEE ARTHROPLASTY;  Surgeon: Dempsey Moan, MD;  Location: [facility] ORS;  Service: Orthopedics;  Laterality: Right;    VESICOVAGINAL FISTULA CLOSURE W/ TAH  11/30/1985    Social History:  reports that she quit smoking about 35 years ago. Her smoking use included cigarettes. She started smoking about 55 years ago. She has a 20 pack-year smoking history. She has never used smokeless tobacco. She reports that she does not drink alcohol and does not use drugs.   Allergies  Allergen Reactions   Codeine Other (See Comments)    Neurological problems - hallucinations, spacey    Family History  Problem Relation Age of Onset   Stroke Mother    Heart disease Mother        CABG at age 8   Heart attack Father        Died of MI at age 95   Emphysema Father    Dementia Brother    Osteoarthritis Other    Celiac disease Other      Prior to Admission medications   Medication Sig Start Date End Date Taking? Authorizing Provider  acetaminophen  (TYLENOL ) 500 MG tablet Take 1,000 mg by mouth every 6 (six) hours as needed for moderate pain.   Yes [provider]  albuterol  (VENTOLIN  HFA) 108 (90 Base) MCG/ACT inhaler INHALE 2 PUFFS INTO THE LUNGS EVERY 6 (SIX) HOURS AS NEEDED FOR WHEEZING. 03/21/21  Yes Geronimo Amel, MD  amLODipine  (NORVASC ) 5 MG tablet Take 5 mg by mouth in the morning.   Yes [provider]  anastrozole  (ARIMIDEX ) 1 MG tablet Take 1 tablet (1 mg total) by mouth daily. 04/14/24  Yes Odean Potts, MD  aspirin  EC 81 MG tablet Take 81 mg by mouth at bedtime.   Yes [provider]  azelastine  (ASTELIN ) 0.1 % nasal spray Place 1 spray into both nostrils 2 (two) times daily as needed for rhinitis. Use in each nostril as directed   Yes [provider]  furosemide  (LASIX ) 40 MG tablet TAKE 2 TABLETS EVERY DAY Patient taking differently: Take 80 mg by mouth daily. 04/12/24  Yes Pietro Redell RAMAN, MD  hydroxychloroquine  (PLAQUENIL ) 200 MG tablet Take 400 mg by mouth in the morning.   Yes [provider]  nortriptyline (PAMELOR) 10 MG capsule Take 30 mg by mouth  at bedtime.   Yes [provider]  pantoprazole  (PROTONIX ) 40 MG tablet Take 40 mg by mouth 2 (two) times daily.   Yes [provider]  predniSONE  (DELTASONE ) 5 MG tablet Take 5 mg by mouth in the morning. 03/31/22  Yes [provider]  zolpidem  (AMBIEN ) 10 MG tablet Take 10 mg by mouth at bedtime.   Yes [provider]    Physical Exam: BP 138/64 (BP Location: Right Arm)   Pulse 79   Temp 98.7 F (37.1 C) (Oral)   Resp 18   SpO2 97% Comment: left earlob  General: 78 y.o. year-old female ill appearing and in no acute distress.  Alert and oriented x3. HEENT: NCAT, EOMI Neck: Supple, trachea medial Cardiovascular: Regular rate and rhythm with no rubs or gallops.  No thyromegaly or JVD noted.  No lower extremity edema. 2/4 pulses in all  4 extremities. Respiratory: Clear to auscultation with no wheezes or rales. Good inspiratory effort. Abdomen: Soft, nontender nondistended with normal bowel sounds x4 quadrants. Muskuloskeletal: No cyanosis, clubbing or edema noted bilaterally Neuro: CN II-XII intact, strength 5/5 x 4, sensation, reflexes intact Skin: No ulcerative lesions noted or rashes Psychiatry: Judgement and insight appear normal. Mood is appropriate for condition and setting          Labs on Admission:  Basic Metabolic Panel: Recent Labs  Lab 07/20/24 1731  NA 136  K 3.6  CL 90*  CO2 30  GLUCOSE 203*  BUN 19  CREATININE 1.19*  CALCIUM  9.5   Liver Function Tests: Recent Labs  Lab 07/20/24 1731  AST 40  ALT 30  ALKPHOS 101  BILITOT 1.3*  PROT 7.9  ALBUMIN 4.0   No results for input(s): LIPASE, AMYLASE in the last 168 hours. No results for input(s): AMMONIA in the last 168 hours. CBC: Recent Labs  Lab 07/20/24 1731  WBC 17.1*  HGB 15.8*  HCT 46.6*  MCV 95.7  PLT 483*   Cardiac Enzymes: No results for input(s): CKTOTAL, CKMB, CKMBINDEX, TROPONINI in the last 168 hours.  BNP (last 3 results) No results  for input(s): BNP in the last 8760 hours.  ProBNP (last 3 results) No results for input(s): PROBNP in the last 8760 hours.  CBG: No results for input(s): GLUCAP in the last 168 hours.  Radiological Exams on Admission: DG Chest Port 1 View Result Date: 07/20/2024 CLINICAL DATA:  Questionable sepsis.  Check for pneumonia. EXAM: PORTABLE CHEST 1 VIEW COMPARISON:  Portable chest 03/16/2021. FINDINGS: There is mild cardiomegaly with no evidence of CHF. The mediastinum is normally outlined. There is scarring and tenting along the left hemidiaphragm, chronic. The lungs are hypoinflated with chronic moderate elevation of the right hemidiaphragm. The visualized lungs are clear with limited view of the right base. There is a right shoulder reverse arthroplasty, osteopenia and thoracic spondylosis, slight dextroscoliosis. Multiple overlying telemetry leads.  Left axillary surgical clips. IMPRESSION: 1. No evidence of acute chest disease. Limited view of the right base due to chronic elevation of the right hemidiaphragm. 2. Mild cardiomegaly. Electronically Signed   By: Francis Quam M.D.   On: 07/20/2024 20:03    EKG: I independently viewed the EKG done and my findings are as followed: Normal sinus rhythm at rate of 83 bpm with QTc of 493 ms  Assessment/Plan Present on Admission:  GERD without esophagitis  Essential hypertension  Principal Problem:   SIRS (systemic inflammatory response syndrome) (HCC) Active Problems:   Essential hypertension   History of breast cancer   GERD without esophagitis   Generalized weakness   Lactic acid acidosis   Hyperglycemia   SIRS Patient was tachypneic and presented with leukocytosis (though this may be due to patient's chronic use of steroids). She was recently admitted due to sepsis secondary to E. coli and Klebsiella UTI She was started on IV ceftriaxone , continue with same antibiotic at this time  Lactic acid Lactic acid 2.5 > 2.1 Continue IV  hydration and continue to trend lactic acid  Generalized weakness in the setting of above 2 Continue fall precaution Continue PT/OT eval and treat  Hyperglycemia with no history of T2DM Blood glucose level at 203 Patient is chronically on prednisone  for rheumatoid arthritis Continue ISS and hypoglycemia protocol  GERD without esophagitis Continue Protonix    History of breast cancer Continue Arimidex .   Essential hypertension  Continue amlodipine    DVT prophylaxis: Lovenox   Code Status: DNR  Family Communication: None at bedside  Consults: None  Severity of Illness: The appropriate patient status for this patient is INPATIENT. Inpatient status is judged to be reasonable and necessary in order to provide the required intensity of service to ensure the patient's safety. The patient's presenting symptoms, physical exam findings, and initial radiographic and laboratory data in the context of their chronic comorbidities is felt to place them at high risk for further clinical deterioration. Furthermore, it is not anticipated that the patient will be medically stable for discharge from the hospital within 2 midnights of admission.   * I certify that at the point of admission it is my clinical judgment that the patient will require inpatient hospital care spanning beyond 2 midnights from the point of admission due to high intensity of service, high risk for further deterioration and high frequency of surveillance required.*  Author: Tage Feggins, DO 07/21/2024 12:55 AM  For on call review [email or web address].

## 2024-07-20 NOTE — ED Triage Notes (Signed)
 Pt reports she was seen a week ago and has been sick ever since.  Pt was told by her cardiologist that they felt her sepsis may not be resolved.

## 2024-07-20 NOTE — ED Provider Notes (Signed)
  EMERGENCY DEPARTMENT AT [facility] Provider Note   CSN: [id number] Arrival date & time: 07/20/24  1539     Patient presents with: Shortness of Breath   [name] is a 78 y.o. female.  {Add pertinent medical, surgical, social history, OB history to YEP:67052} Patient was discharged from the hospital a week ago with urosepsis.  She states that she is still extremely fatigued she is having dizziness she has not eating or drinking enough.  She was seen by cardiology today and told to come back to the emergency department for possible sepsis   Shortness of Breath      Prior to Admission medications   Medication Sig Start Date End Date Taking? Authorizing Provider  acetaminophen  (TYLENOL ) 500 MG tablet Take 1,000 mg by mouth every 6 (six) hours as needed for moderate pain.   Yes [provider]  albuterol  (VENTOLIN  HFA) 108 (90 Base) MCG/ACT inhaler INHALE 2 PUFFS INTO THE LUNGS EVERY 6 (SIX) HOURS AS NEEDED FOR WHEEZING. 03/21/21  Yes Geronimo Amel, MD  amLODipine  (NORVASC ) 5 MG tablet Take 5 mg by mouth in the morning.   Yes [provider]  anastrozole  (ARIMIDEX ) 1 MG tablet Take 1 tablet (1 mg total) by mouth daily. 04/14/24  Yes Odean Potts, MD  aspirin  EC 81 MG tablet Take 81 mg by mouth at bedtime.   Yes [provider]  azelastine  (ASTELIN ) 0.1 % nasal spray Place 1 spray into both nostrils 2 (two) times daily as needed for rhinitis. Use in each nostril as directed   Yes [provider]  furosemide  (LASIX ) 40 MG tablet TAKE 2 TABLETS EVERY DAY Patient taking differently: Take 80 mg by mouth daily. 04/12/24  Yes Pietro Redell RAMAN, MD  hydroxychloroquine  (PLAQUENIL ) 200 MG tablet Take 400 mg by mouth in the morning.   Yes [provider]  nortriptyline (PAMELOR) 10 MG capsule Take 30 mg by mouth at bedtime.   Yes [provider]  pantoprazole  (PROTONIX ) 40 MG tablet Take 40 mg by mouth 2 (two)  times daily.   Yes [provider]  predniSONE  (DELTASONE ) 5 MG tablet Take 5 mg by mouth in the morning. 03/31/22  Yes [provider]  zolpidem  (AMBIEN ) 10 MG tablet Take 10 mg by mouth at bedtime.   Yes [provider]    Allergies: Codeine    Review of Systems  Respiratory:  Positive for shortness of breath.     Updated Vital Signs BP 138/64 (BP Location: Right Arm)   Pulse 79   Temp 98.7 F (37.1 C) (Oral)   Resp 18   SpO2 97% Comment: left earlob  Physical Exam  (all labs ordered are listed, but only abnormal results are displayed) Labs Reviewed  CBC - Abnormal; Notable for the following components:      Result Value   WBC 17.1 (*)    Hemoglobin 15.8 (*)    HCT 46.6 (*)    Platelets 483 (*)    All other components within normal limits  BASIC METABOLIC PANEL WITH GFR - Abnormal; Notable for the following components:   Chloride 90 (*)    Glucose, Bld 203 (*)    Creatinine, Ser 1.19 (*)    GFR, Estimated 47 (*)    Anion gap 16 (*)    All other components within normal limits  LACTIC ACID, PLASMA - Abnormal; Notable for the following components:   Lactic Acid, Venous 2.5 (*)    All other  components within normal limits  URINALYSIS, W/ REFLEX TO CULTURE (INFECTION SUSPECTED) - Abnormal; Notable for the following components:   Leukocytes,Ua SMALL (*)    Bacteria, UA RARE (*)    All other components within normal limits  HEPATIC FUNCTION PANEL - Abnormal; Notable for the following components:   Total Bilirubin 1.3 (*)    Bilirubin, Direct 0.3 (*)    Indirect Bilirubin 1.0 (*)    All other components within normal limits  RESP PANEL BY RT-PCR (RSV, FLU A&B, COVID)  RVPGX2  CULTURE, BLOOD (ROUTINE X 2)  CULTURE, BLOOD (ROUTINE X 2)  URINE CULTURE  PROTIME-INR    EKG: None  Radiology: [facility] Chest Port 1 View Result Date: 07/20/2024 CLINICAL DATA:  Questionable sepsis.  Check for pneumonia. EXAM: PORTABLE CHEST 1 VIEW COMPARISON:  Portable  chest 03/16/2021. FINDINGS: There is mild cardiomegaly with no evidence of CHF. The mediastinum is normally outlined. There is scarring and tenting along the left hemidiaphragm, chronic. The lungs are hypoinflated with chronic moderate elevation of the right hemidiaphragm. The visualized lungs are clear with limited view of the right base. There is a right shoulder reverse arthroplasty, osteopenia and thoracic spondylosis, slight dextroscoliosis. Multiple overlying telemetry leads.  Left axillary surgical clips. IMPRESSION: 1. No evidence of acute chest disease. Limited view of the right base due to chronic elevation of the right hemidiaphragm. 2. Mild cardiomegaly. Electronically Signed   By: Francis Quam M.D.   On: 07/20/2024 20:03  CRITICAL CARE Performed by: Fairy Sermon Total critical care time: 45 minutes Critical care time was exclusive of separately billable procedures and treating other patients. Critical care was necessary to treat or prevent imminent or life-threatening deterioration. Critical care was time spent personally by me on the following activities: development of treatment plan with patient and/or surrogate as well as nursing, discussions with consultants, evaluation of patient's response to treatment, examination of patient, obtaining history from patient or surrogate, ordering and performing treatments and interventions, ordering and review of laboratory studies, ordering and review of radiographic studies, pulse oximetry and re-evaluation of patient's condition.   {Document cardiac monitor, telemetry assessment procedure when appropriate:32947} Procedures   Medications Ordered in the ED  sodium chloride  0.9 % bolus 2,000 mL (2,000 mLs Intravenous New Bag/Given 07/20/24 1938)  cefTRIAXone  (ROCEPHIN ) 2 g in sodium chloride  0.9 % 100 mL IVPB (0 g Intravenous Stopped 07/20/24 2015)      {Click here for ABCD2, HEART and other calculators REFRESH Note before signing:1}                               Medical Decision Making Amount and/or Complexity of Data Reviewed Labs: ordered. Radiology: ordered.  Risk Decision regarding hospitalization.  Patient with continued urosepsis.  She will be admitted to medicine  {Document critical care time when appropriate  Document review of labs and clinical decision tools ie CHADS2VASC2, etc  Document your independent review of radiology images and any outside records  Document your discussion with family members, caretakers and with consultants  Document social determinants of health affecting pt's care  Document your decision making why or why not admission, treatments were needed:32947:::1}   Final diagnoses:  Acute sepsis [facility])    ED Discharge Orders     None

## 2024-07-21 DIAGNOSIS — C50919 Malignant neoplasm of unspecified site of unspecified female breast: Secondary | ICD-10-CM

## 2024-07-21 DIAGNOSIS — E0965 Drug or chemical induced diabetes mellitus with hyperglycemia: Secondary | ICD-10-CM

## 2024-07-21 DIAGNOSIS — N390 Urinary tract infection, site not specified: Secondary | ICD-10-CM

## 2024-07-21 DIAGNOSIS — R6510 Systemic inflammatory response syndrome (SIRS) of non-infectious origin without acute organ dysfunction: Secondary | ICD-10-CM | POA: Diagnosis not present

## 2024-07-21 DIAGNOSIS — K760 Fatty (change of) liver, not elsewhere classified: Secondary | ICD-10-CM

## 2024-07-21 DIAGNOSIS — E876 Hypokalemia: Secondary | ICD-10-CM

## 2024-07-21 DIAGNOSIS — Z7952 Long term (current) use of systemic steroids: Secondary | ICD-10-CM

## 2024-07-21 DIAGNOSIS — I7300 Raynaud's syndrome without gangrene: Secondary | ICD-10-CM

## 2024-07-21 DIAGNOSIS — R739 Hyperglycemia, unspecified: Secondary | ICD-10-CM | POA: Insufficient documentation

## 2024-07-21 DIAGNOSIS — M069 Rheumatoid arthritis, unspecified: Secondary | ICD-10-CM

## 2024-07-21 DIAGNOSIS — E872 Acidosis, unspecified: Secondary | ICD-10-CM | POA: Insufficient documentation

## 2024-07-21 DIAGNOSIS — A4159 Other Gram-negative sepsis: Secondary | ICD-10-CM

## 2024-07-21 DIAGNOSIS — A4151 Sepsis due to Escherichia coli [E. coli]: Principal | ICD-10-CM

## 2024-07-21 DIAGNOSIS — K529 Noninfective gastroenteritis and colitis, unspecified: Secondary | ICD-10-CM

## 2024-07-21 LAB — GLUCOSE, CAPILLARY
Glucose-Capillary: 190 mg/dL — ABNORMAL HIGH (ref 70–99)
Glucose-Capillary: 112 mg/dL — ABNORMAL HIGH (ref 70–99)

## 2024-07-21 LAB — CBC
HCT: 40 % (ref 36.0–46.0)
Hemoglobin: 13.8 g/dL (ref 12.0–15.0)
MCH: 32.6 pg (ref 26.0–34.0)
MCHC: 34.5 g/dL (ref 30.0–36.0)
MCV: 94.6 fL (ref 80.0–100.0)
Platelets: 361 K/uL (ref 150–400)
RBC: 4.23 MIL/uL (ref 3.87–5.11)
RDW: 12.6 % (ref 11.5–15.5)
WBC: 16.6 K/uL — ABNORMAL HIGH (ref 4.0–10.5)
nRBC: 0 % (ref 0.0–0.2)

## 2024-07-21 LAB — COMPREHENSIVE METABOLIC PANEL WITH GFR
ALT: 23 U/L (ref 0–44)
AST: 32 U/L (ref 15–41)
Albumin: 2.9 g/dL — ABNORMAL LOW (ref 3.5–5.0)
Alkaline Phosphatase: 76 U/L (ref 38–126)
Anion gap: 14 (ref 5–15)
BUN: 13 mg/dL (ref 8–23)
CO2: 25 mmol/L (ref 22–32)
Calcium: 8.1 mg/dL — ABNORMAL LOW (ref 8.9–10.3)
Chloride: 96 mmol/L — ABNORMAL LOW (ref 98–111)
Creatinine, Ser: 0.84 mg/dL (ref 0.44–1.00)
GFR, Estimated: >60 mL/min (ref >60)
Glucose, Bld: 151 mg/dL — ABNORMAL HIGH (ref 70–99)
Potassium: 2.6 mmol/L — CL (ref 3.5–5.1)
Sodium: 135 mmol/L (ref 135–145)
Total Bilirubin: 0.5 mg/dL (ref 0.0–1.2)
Total Protein: 5.9 g/dL — ABNORMAL LOW (ref 6.5–8.1)

## 2024-07-21 LAB — CBG MONITORING, ED
Glucose-Capillary: 118 mg/dL — ABNORMAL HIGH (ref 70–99)
Glucose-Capillary: 169 mg/dL — ABNORMAL HIGH (ref 70–99)

## 2024-07-21 LAB — HEMOGLOBIN A1C
Hgb A1c MFr Bld: 5.9 % — ABNORMAL HIGH (ref 4.8–5.6)
Mean Plasma Glucose: 122.63 mg/dL

## 2024-07-21 LAB — PHOSPHORUS: Phosphorus: 2.4 mg/dL — ABNORMAL LOW (ref 2.5–4.6)

## 2024-07-21 LAB — TROPONIN I (HIGH SENSITIVITY)
Troponin I (High Sensitivity): 27 ng/L — ABNORMAL HIGH (ref <18)
Troponin I (High Sensitivity): 24 ng/L — ABNORMAL HIGH (ref <18)

## 2024-07-21 LAB — LACTIC ACID, PLASMA: Lactic Acid, Venous: 1.7 mmol/L (ref 0.5–1.9)

## 2024-07-21 LAB — MAGNESIUM: Magnesium: 1.8 mg/dL (ref 1.7–2.4)

## 2024-07-21 MED ORDER — INSULIN ASPART 100 UNIT/ML IJ SOLN
0.0000 [IU] | Freq: Three times a day (TID) | INTRAMUSCULAR | Status: DC
  Administered 2024-07-21: 3 [IU] via SUBCUTANEOUS
  Filled 2024-07-21: qty 1

## 2024-07-21 MED ORDER — HYDROXYCHLOROQUINE SULFATE 200 MG PO TABS
400.0000 mg | ORAL_TABLET | Freq: Every day | ORAL | Status: DC
  Administered 2024-07-21 – 2024-07-22 (×2): 400 mg via ORAL
  Filled 2024-07-21 (×3): qty 2

## 2024-07-21 MED ORDER — POTASSIUM PHOSPHATES 15 MMOLE/5ML IV SOLN
30.0000 mmol | Freq: Once | INTRAVENOUS | Status: DC
  Filled 2024-07-21: qty 10

## 2024-07-21 MED ORDER — ADULT MULTIVITAMIN W/MINERALS CH
1.0000 | ORAL_TABLET | Freq: Every day | ORAL | Status: DC
  Administered 2024-07-21: 1 via ORAL
  Filled 2024-07-21 (×2): qty 1

## 2024-07-21 MED ORDER — ZOLPIDEM TARTRATE 5 MG PO TABS
5.0000 mg | ORAL_TABLET | Freq: Every day | ORAL | Status: DC
  Administered 2024-07-21: 5 mg via ORAL
  Filled 2024-07-21: qty 1

## 2024-07-21 MED ORDER — PREDNISONE 5 MG PO TABS
5.0000 mg | ORAL_TABLET | Freq: Every day | ORAL | Status: DC
  Administered 2024-07-21: 5 mg via ORAL
  Filled 2024-07-21: qty 1

## 2024-07-21 MED ORDER — PREDNISONE 10 MG PO TABS
10.0000 mg | ORAL_TABLET | Freq: Every day | ORAL | Status: DC
  Administered 2024-07-22: 10 mg via ORAL
  Filled 2024-07-21: qty 1

## 2024-07-21 MED ORDER — ENSURE PLUS HIGH PROTEIN PO LIQD
237.0000 mL | Freq: Two times a day (BID) | ORAL | Status: DC
  Administered 2024-07-21 – 2024-07-22 (×3): 237 mL via ORAL
  Filled 2024-07-21 (×3): qty 237

## 2024-07-21 MED ORDER — MAGNESIUM SULFATE 2 GM/50ML IV SOLN
2.0000 g | Freq: Once | INTRAVENOUS | Status: AC
  Administered 2024-07-21: 2 g via INTRAVENOUS
  Filled 2024-07-21: qty 50

## 2024-07-21 MED ORDER — ACETAMINOPHEN 325 MG PO TABS: 650.0000 mg | ORAL_TABLET | Freq: Four times a day (QID) | ORAL | Status: DC | PRN

## 2024-07-21 MED ORDER — LACTATED RINGERS IV SOLN: INTRAVENOUS | Status: DC

## 2024-07-21 MED ORDER — ONDANSETRON HCL 4 MG/2ML IJ SOLN
4.0000 mg | Freq: Four times a day (QID) | INTRAMUSCULAR | Status: DC | PRN
  Administered 2024-07-21: 4 mg via INTRAVENOUS
  Filled 2024-07-21: qty 2

## 2024-07-21 MED ORDER — PREDNISONE 10 MG PO TABS: 5.0000 mg | ORAL_TABLET | Freq: Every morning | ORAL | Status: DC

## 2024-07-21 MED ORDER — POTASSIUM PHOSPHATES 15 MMOLE/5ML IV SOLN
30.0000 mmol | Freq: Once | INTRAVENOUS | Status: AC
  Administered 2024-07-21: 30 mmol via INTRAVENOUS
  Filled 2024-07-21: qty 10

## 2024-07-21 MED ORDER — AMLODIPINE BESYLATE 5 MG PO TABS
5.0000 mg | ORAL_TABLET | Freq: Every morning | ORAL | Status: DC
  Administered 2024-07-21 – 2024-07-22 (×2): 5 mg via ORAL
  Filled 2024-07-21 (×2): qty 1

## 2024-07-21 MED ORDER — ACETAMINOPHEN 650 MG RE SUPP: 650.0000 mg | Freq: Four times a day (QID) | RECTAL | Status: DC | PRN

## 2024-07-21 MED ORDER — LACTATED RINGERS IV SOLN: INTRAVENOUS | Status: AC

## 2024-07-21 MED ORDER — ONDANSETRON HCL 4 MG PO TABS: 4.0000 mg | ORAL_TABLET | Freq: Four times a day (QID) | ORAL | Status: DC | PRN

## 2024-07-21 MED ORDER — PANTOPRAZOLE SODIUM 40 MG PO TBEC
40.0000 mg | DELAYED_RELEASE_TABLET | Freq: Two times a day (BID) | ORAL | Status: DC
  Administered 2024-07-21 – 2024-07-22 (×4): 40 mg via ORAL
  Filled 2024-07-21 (×4): qty 1

## 2024-07-21 MED ORDER — POTASSIUM CHLORIDE CRYS ER 20 MEQ PO TBCR
40.0000 meq | EXTENDED_RELEASE_TABLET | Freq: Once | ORAL | Status: AC
  Administered 2024-07-21: 40 meq via ORAL
  Filled 2024-07-21: qty 2

## 2024-07-21 MED ORDER — ENOXAPARIN SODIUM 40 MG/0.4ML IJ SOSY
40.0000 mg | PREFILLED_SYRINGE | INTRAMUSCULAR | Status: DC
  Administered 2024-07-21 – 2024-07-22 (×2): 40 mg via SUBCUTANEOUS
  Filled 2024-07-21 (×2): qty 0.4

## 2024-07-21 MED ORDER — ANASTROZOLE 1 MG PO TABS
1.0000 mg | ORAL_TABLET | Freq: Every day | ORAL | Status: DC
  Administered 2024-07-21 – 2024-07-22 (×2): 1 mg via ORAL
  Filled 2024-07-21 (×3): qty 1

## 2024-07-21 MED ORDER — SODIUM CHLORIDE 0.9 % IV SOLN
1.0000 g | INTRAVENOUS | Status: DC
  Administered 2024-07-21 – 2024-07-22 (×2): 1 g via INTRAVENOUS
  Filled 2024-07-21 (×2): qty 10

## 2024-07-21 NOTE — Evaluation (Addendum)
 Physical Therapy Evaluation Patient Details Name: [name] MRN: [id number] DOB: 05-06-1946 Today's Date: 07/21/2024  History of Present Illness  [name] is a 78 y.o. female with medical history significant of GERD, essential hypertension, osteoarthritis, pulmonary fibrosis, Raynaud's disease, rheumatoid arthritis, scleroderma, seasonal allergies and diastolic CHF, who presented to the emergency department after being asked to go to the ED by her cardiologist.  Patient was recently admitted from 8/11 to 8/14 due to sepsis secondary to E. coli and Klebsiella UTI and she was treated with Rocephin  and discharged home on cefadroxil .  However, patient states that since she returned home she has continued to feel weak and has been having poor appetite with decreased oral intake.  She went to see her cardiologist today due to sensation of skipping heartbeat/palpitation and after being examined, it was suspected that she continues to have persistent sepsis and was told to go to the ED for further management.   Clinical Impression  Patient agreeable to and tolerated OT/PT session well. Patient was received supine in bed. On this date, patient was independent with all bed mobility, functional transfers, and ambulation without an AD. Patient reports moving a little slower during gait than usual due to increased SOB and chest pain but no overt unsteadiness/LOB this date. Patient does not demonstrate any stark difference between BLE strength. Patient returned to sitting EOB at end of session, call button within reach, and nursing notified. Patient does not present with urgent need for skilled physical therapy at this time. Patient discharged to care of nursing for ambulation daily as tolerated for length of stay.       If plan is discharge home, recommend the following:     Can travel by private vehicle        Equipment Recommendations None recommended by PT  Recommendations for Other  Services       Functional Status Assessment Patient has had a recent decline in their functional status and demonstrates the ability to make significant improvements in function in a reasonable and predictable amount of time.     Precautions / Restrictions Precautions Precautions: Fall Recall of Precautions/Restrictions: Intact Restrictions Weight Bearing Restrictions Per Provider Order: No      Mobility  Bed Mobility Overal bed mobility: Independent   General bed mobility comments: HOB flat, no use of railings    Transfers Overall transfer level: Independent Equipment used: None     General transfer comment: STS from bed and chair, no AD    Ambulation/Gait Ambulation/Gait assistance: Independent Gait Distance (Feet): 150 Feet Assistive device: None Gait Pattern/deviations: WFL(Within Functional Limits) Gait velocity: WNL     General Gait Details: Gait WNL, no overt LOB or unsteadiness, pt initially with good speed but slows down a bit to control breathing SOB  Stairs        Wheelchair Mobility     Tilt Bed    Modified Rankin (Stroke Patients Only)       Balance Overall balance assessment: Mild deficits observed, not formally tested           Pertinent Vitals/Pain Pain Assessment Pain Assessment: No/denies pain    Home Living Family/patient expects to be discharged to:: Private residence Living Arrangements: Spouse/significant other Available Help at Discharge: Family;Available 24 hours/day Type of Home: House Home Access: Stairs to enter Entrance Stairs-Rails: Can reach both;Left;Right Entrance Stairs-Number of Steps: 6   Home Layout: Two level;Able to live on main level with bedroom/bathroom Home Equipment: Shower seat  Prior Function Prior Level of Function : Independent/Modified Independent     Mobility Comments: Tourist information centre manager without AD ADLs Comments: Independent     Extremity/Trunk Assessment   Upper Extremity  Assessment Upper Extremity Assessment: Defer to OT evaluation    Lower Extremity Assessment Lower Extremity Assessment: Overall WFL for tasks assessed;Generalized weakness (Hip flexion MMT 4-/5, ankle DF 5/5 all bilaterally)    Cervical / Trunk Assessment Cervical / Trunk Assessment: Normal  Communication   Communication Communication: No apparent difficulties    Cognition Arousal: Alert Behavior During Therapy: WFL for tasks assessed/performed   PT - Cognitive impairments: No apparent impairments     Following commands: Intact       Cueing Cueing Techniques: Verbal cues     General Comments      Exercises     Assessment/Plan    PT Assessment Patient does not need any further PT services  PT Problem List         PT Treatment Interventions      PT Goals (Current goals can be found in the Care Plan section)  Acute Rehab PT Goals Patient Stated Goal: Return home once medically stable PT Goal Formulation: With patient Time For Goal Achievement: 07/24/24 Potential to Achieve Goals: Good    Frequency       Co-evaluation PT/OT/SLP Co-Evaluation/Treatment: Yes Reason for Co-Treatment: To address functional/ADL transfers PT goals addressed during session: Mobility/safety with mobility         AM-PAC PT 6 Clicks Mobility  Outcome Measure Help needed turning from your back to your side while in a flat bed without using bedrails?: None Help needed moving from lying on your back to sitting on the side of a flat bed without using bedrails?: None Help needed moving to and from a bed to a chair (including a wheelchair)?: None Help needed standing up from a chair using your arms (e.g., wheelchair or bedside chair)?: None Help needed to walk in hospital room?: None Help needed climbing 3-5 steps with a railing? : None 6 Click Score: 24    End of Session         PT Visit Diagnosis: Other abnormalities of gait and mobility (R26.89)    Time: 9157-9148 PT  Time Calculation (min) (ACUTE ONLY): 9 min   Charges:   PT Evaluation $PT Eval Low Complexity: 1 Low   PT General Charges $$ ACUTE PT VISIT: 1 Visit         2:18 PM, 07/21/24 Rosaria Settler, PT, DPT [place] with [facility]

## 2024-07-21 NOTE — ED Notes (Signed)
 Report given to [facility].

## 2024-07-21 NOTE — Evaluation (Signed)
 Occupational Therapy Evaluation Patient Details Name: [name] MRN: [id number] DOB: 01/19/1946 Today's Date: 07/21/2024   History of Present Illness   [name] is a 78 y.o. female with medical history significant of GERD, essential hypertension, osteoarthritis, pulmonary fibrosis, Raynaud's disease, rheumatoid arthritis, scleroderma, seasonal allergies and diastolic CHF, who presented to the emergency department after being asked to go to the ED by her cardiologist.  Patient was recently admitted from 8/11 to 8/14 due to sepsis secondary to E. coli and Klebsiella UTI and she was treated with Rocephin  and discharged home on cefadroxil .  However, patient states that since she returned home she has continued to feel weak and has been having poor appetite with decreased oral intake.  She went to see her cardiologist today due to sensation of skipping heartbeat/palpitation and after being examined, it was suspected that she continues to have persistent sepsis and was told to go to the ED for further management. (per MD)     Clinical Impressions Pt agreeable to OT and PT co-evaluation. Pt appears to be near baseline function today. Able to ambulate in the room and hall with slow movement but no physical assist or AD. Pt shows ability to complete ADL's independently. Mild R shoulder weakness from baseline issue. Pt left in the bed with call bell within reach. Pt is not recommended for further acute OT services and will be discharged to care of nursing staff for remaining length of stay.             Functional Status Assessment   Patient has not had a recent decline in their functional status     Equipment Recommendations   None recommended by OT             Precautions/Restrictions   Precautions Precautions: Fall Recall of Precautions/Restrictions: Intact Restrictions Weight Bearing Restrictions Per Provider Order: No     Mobility Bed Mobility Overal bed  mobility: Independent                  Transfers Overall transfer level: Independent                        Balance Overall balance assessment: Mild deficits observed, not formally tested (slow movement during ambulation)                                         ADL either performed or assessed with clinical judgement   ADL Overall ADL's : Independent                                       General ADL Comments: Able to functionally ambulate in the hall without AD. No issues with ADL's per observation and clinical judgement.     Vision Baseline Vision/History: 1 Wears glasses Ability to See in Adequate Light: 1 Impaired Patient Visual Report: No change from baseline Vision Assessment?: No apparent visual deficits     Perception Perception: Not tested       Praxis Praxis: Not tested       Pertinent Vitals/Pain Pain Assessment Pain Assessment: No/denies pain     Extremity/Trunk Assessment Upper Extremity Assessment Upper Extremity Assessment: RUE deficits/detail RUE Deficits / Details: 4/5 R shoulder flexion. Pt reports baseline shoulder injury.   Lower Extremity  Assessment Lower Extremity Assessment: Defer to PT evaluation   Cervical / Trunk Assessment Cervical / Trunk Assessment: Normal   Communication Communication Communication: No apparent difficulties   Cognition Arousal: Alert Behavior During Therapy: WFL for tasks assessed/performed Cognition: No apparent impairments                               Following commands: Intact       Cueing  General Comments   Cueing Techniques: Verbal cues                 Home Living Family/patient expects to be discharged to:: Private residence Living Arrangements: Spouse/significant other Available Help at Discharge: Family;Available 24 hours/day Type of Home: House Home Access: Stairs to enter [employer] of Steps: 6 Entrance  Stairs-Rails: Can reach both;Left;Right Home Layout: Two level;Able to live on main level with bedroom/bathroom     Bathroom Shower/Tub: Producer, television/film/video: Handicapped height Bathroom Accessibility: Yes How Accessible: Accessible via wheelchair;Accessible via walker Home Equipment: Shower seat          Prior Functioning/Environment Prior Level of Function : Independent/Modified Independent             Mobility Comments: Tourist information centre manager without AD ADLs Comments: Independent                            Co-evaluation PT/OT/SLP Co-Evaluation/Treatment: Yes Reason for Co-Treatment: To address functional/ADL transfers   OT goals addressed during session: ADL's and self-care                       End of Session    Activity Tolerance: Patient tolerated treatment well Patient left: in bed;with call bell/phone within reach  OT Visit Diagnosis: Unsteadiness on feet (R26.81)                Time: 9157-9149 OT Time Calculation (min): 8 min Charges:  OT General Charges $OT Visit: 1 Visit OT Evaluation $OT Eval Low Complexity: 1 Low  Beckam Abdulaziz OT, MOT  Jayson Person 07/21/2024, 9:39 AM

## 2024-07-21 NOTE — ED Notes (Signed)
CBG 169. 

## 2024-07-21 NOTE — H&P (Incomplete)
 History and Physical    Patient: [name] FMW:[id number] DOB: 1946/03/03 DOA: 07/20/2024 DOS: the patient was seen and examined on 07/20/2024 PCP: Sheryle Carwin, MD  Patient coming from: Home  Chief Complaint:  Chief Complaint  Patient presents with  . Shortness of Breath   HPI: [name] is a 78 y.o. female with medical history significant of GERD, essential hypertension, osteoarthritis, pulmonary fibrosis, Raynaud's disease, rheumatoid arthritis, scleroderma, seasonal allergies and diastolic CHF, who presented to the emergency department after being asked to go to the ED by her cardiologist. Patient was recently admitted from 8/11 to 8/14 due to sepsis due to E. coli and Klebsiella UTI and she was treated with Rocephin  and discharged home on cefadroxil .  However, patient states that since she returned home she has continued to feel weak and has been having poor appetite with decreased oral intake.  She went to see her cardiologist today due to sensation of skipping heartbeat/palpitation and after being examined, it was suspected that she continues to have persistent sepsis and was told to go to the ED for further management.  ED Course:  In the emergency department, she was hemodynamically stable.  Workup in the ED showed CBC showed WBC of 17.1, hemoglobin 15.8, hematocrit 46.6, MCV 95.7, platelets 43.  Normal BMP except for chloride of 90, creatinine 1.19 (baseline creatinine at 0.7 to 0.9) and blood glucose of 203.  Lactic acid 2.5.  Urinalysis showed small leukocytes with rare bacteria.  Influenza A, B, SARS-CoV-2, RSV was negative. Chest x-ray showed no evidence of acute chest disease She was treated with IV ceftriaxone , IV hydration was provided. TRH was asked to admit patient  Review of Systems: Review of systems as noted in the HPI. All other systems reviewed and are negative.   Past Medical History:  Diagnosis Date  . Cancer [facility])    Left breast cancer  .  Diastolic congestive heart failure (HCC)    02/05/15  states no fluid in extremities, breathing same as has been  . GERD (gastroesophageal reflux disease)   . Hepatic steatosis   . History of blood transfusion   . Hx of cholecystectomy   . Hypertension   . Interstitial lung disease (HCC)   . Osteoarthritis   . Pulmonary fibrosis (HCC)   . Raynaud disease   . Rheumatoid arthritis(714.0)   . Right BBB/left ant fasc block   . Scleroderma (HCC)   . Seasonal allergies   . Shortness of breath dyspnea    states normal for her  . Systemic sclerosis [facility])    Past Surgical History:  Procedure Laterality Date  . ABDOMINAL HYSTERECTOMY    . BLADDER SUSPENSION    . BREAST BIOPSY  11/03/2022   MM LT RADIOACTIVE SEED LOC MAMMO GUIDE 11/03/2022 [facility] MAMMOGRAPHY  . BREAST LUMPECTOMY WITH RADIOACTIVE SEED LOCALIZATION Left 11/04/2022   Procedure: LEFT BREAST LUMPECTOMY WITH RADIOACTIVE SEED LOCALIZATION;  Surgeon: Vanderbilt Ned, MD;  Location: [facility] OR;  Service: General;  Laterality: Left;  . CARPAL TUNNEL RELEASE Right 2007  . EYE SURGERY Bilateral 2023   cataract w/ IOL  . LAPAROSCOPIC CHOLECYSTECTOMY    . LEFT HEART CATHETERIZATION WITH CORONARY/GRAFT ANGIOGRAM  03/15/2014   Procedure: LEFT HEART CATHETERIZATION WITH EL BILE;  Surgeon: Lonni JONETTA Cash, MD;  Location: [facility] CATH LAB;  Service: Cardiovascular;;  . REVERSE SHOULDER ARTHROPLASTY Right 05/06/2023   Procedure: REVERSE SHOULDER ARTHROPLASTY;  Surgeon: Melita Drivers, MD;  Location: [facility] ORS;  Service: Orthopedics;  Laterality: Right;   . TONSILLECTOMY    . TOTAL KNEE ARTHROPLASTY Left 02/11/2015   Procedure: LEFT TOTAL KNEE ARTHROPLASTY, RIGHT KNEE CORTISONE INJECTION;  Surgeon: Dempsey Moan, MD;  Location: [facility] ORS;  Service: Orthopedics;  Laterality: Left;  . TOTAL KNEE ARTHROPLASTY Right 10/05/2016   Procedure: RIGHT TOTAL KNEE ARTHROPLASTY;  Surgeon: Dempsey Moan, MD;  Location: [facility] ORS;  Service:  Orthopedics;  Laterality: Right;  SABRA VESICOVAGINAL FISTULA CLOSURE W/ TAH  11/30/1985    Social History:  reports that she quit smoking about 35 years ago. Her smoking use included cigarettes. She started smoking about 55 years ago. She has a 20 pack-year smoking history. She has never used smokeless tobacco. She reports that she does not drink alcohol and does not use drugs.   Allergies  Allergen Reactions  . Codeine Other (See Comments)    Neurological problems - hallucinations, spacey    Family History  Problem Relation Age of Onset  . Stroke Mother   . Heart disease Mother        CABG at age 41  . Heart attack Father        Died of MI at age 84  . Emphysema Father   . Dementia Brother   . Osteoarthritis Other   . Celiac disease Other     ***  Prior to Admission medications   Medication Sig Start Date End Date Taking? Authorizing Provider  acetaminophen  (TYLENOL ) 500 MG tablet Take 1,000 mg by mouth every 6 (six) hours as needed for moderate pain.   Yes [provider]  albuterol  (VENTOLIN  HFA) 108 (90 Base) MCG/ACT inhaler INHALE 2 PUFFS INTO THE LUNGS EVERY 6 (SIX) HOURS AS NEEDED FOR WHEEZING. 03/21/21  Yes Geronimo Amel, MD  amLODipine  (NORVASC ) 5 MG tablet Take 5 mg by mouth in the morning.   Yes [provider]  anastrozole  (ARIMIDEX ) 1 MG tablet Take 1 tablet (1 mg total) by mouth daily. 04/14/24  Yes Odean Potts, MD  aspirin  EC 81 MG tablet Take 81 mg by mouth at bedtime.   Yes [provider]  azelastine  (ASTELIN ) 0.1 % nasal spray Place 1 spray into both nostrils 2 (two) times daily as needed for rhinitis. Use in each nostril as directed   Yes [provider]  furosemide  (LASIX ) 40 MG tablet TAKE 2 TABLETS EVERY DAY Patient taking differently: Take 80 mg by mouth daily. 04/12/24  Yes Pietro Redell RAMAN, MD  hydroxychloroquine  (PLAQUENIL ) 200 MG tablet Take 400 mg by mouth in the morning.   Yes [provider]   nortriptyline (PAMELOR) 10 MG capsule Take 30 mg by mouth at bedtime.   Yes [provider]  pantoprazole  (PROTONIX ) 40 MG tablet Take 40 mg by mouth 2 (two) times daily.   Yes [provider]  predniSONE  (DELTASONE ) 5 MG tablet Take 5 mg by mouth in the morning. 03/31/22  Yes [provider]  zolpidem  (AMBIEN ) 10 MG tablet Take 10 mg by mouth at bedtime.   Yes [provider]    Physical Exam: BP 138/64 (BP Location: Right Arm)   Pulse 79   Temp 98.7 F (37.1 C) (Oral)   Resp 18   SpO2 97% Comment: left earlob  General: 78 y.o. year-old female ill appearing and in no acute distress.  Alert and oriented x3. HEENT: NCAT, EOMI Neck: Supple, trachea medial Cardiovascular: Regular rate and rhythm with no rubs or gallops.  No thyromegaly or JVD noted.  No lower extremity edema. 2/4 pulses in  all 4 extremities. Respiratory: Clear to auscultation with no wheezes or rales. Good inspiratory effort. Abdomen: Soft, nontender nondistended with normal bowel sounds x4 quadrants. Muskuloskeletal: No cyanosis, clubbing or edema noted bilaterally Neuro: CN II-XII intact, strength 5/5 x 4, sensation, reflexes intact Skin: No ulcerative lesions noted or rashes Psychiatry: Judgement and insight appear normal. Mood is appropriate for condition and setting          Labs on Admission:  Basic Metabolic Panel: Recent Labs  Lab 07/20/24 1731  NA 136  K 3.6  CL 90*  CO2 30  GLUCOSE 203*  BUN 19  CREATININE 1.19*  CALCIUM  9.5   Liver Function Tests: Recent Labs  Lab 07/20/24 1731  AST 40  ALT 30  ALKPHOS 101  BILITOT 1.3*  PROT 7.9  ALBUMIN 4.0   No results for input(s): LIPASE, AMYLASE in the last 168 hours. No results for input(s): AMMONIA in the last 168 hours. CBC: Recent Labs  Lab 07/20/24 1731  WBC 17.1*  HGB 15.8*  HCT 46.6*  MCV 95.7  PLT 483*   Cardiac Enzymes: No results for input(s): CKTOTAL, CKMB, CKMBINDEX, TROPONINI  in the last 168 hours.  BNP (last 3 results) No results for input(s): BNP in the last 8760 hours.  ProBNP (last 3 results) No results for input(s): PROBNP in the last 8760 hours.  CBG: No results for input(s): GLUCAP in the last 168 hours.  Radiological Exams on Admission: DG Chest Port 1 View Result Date: 07/20/2024 CLINICAL DATA:  Questionable sepsis.  Check for pneumonia. EXAM: PORTABLE CHEST 1 VIEW COMPARISON:  Portable chest 03/16/2021. FINDINGS: There is mild cardiomegaly with no evidence of CHF. The mediastinum is normally outlined. There is scarring and tenting along the left hemidiaphragm, chronic. The lungs are hypoinflated with chronic moderate elevation of the right hemidiaphragm. The visualized lungs are clear with limited view of the right base. There is a right shoulder reverse arthroplasty, osteopenia and thoracic spondylosis, slight dextroscoliosis. Multiple overlying telemetry leads.  Left axillary surgical clips. IMPRESSION: 1. No evidence of acute chest disease. Limited view of the right base due to chronic elevation of the right hemidiaphragm. 2. Mild cardiomegaly. Electronically Signed   By: Francis Quam M.D.   On: 07/20/2024 20:03    EKG: I independently viewed the EKG done and my findings are as followed: ***   Assessment/Plan Present on Admission: **None**  Principal Problem:   Generalized weakness   DVT prophylaxis: ***   Code Status: ***   Family Communication: ***   Disposition Plan: ***   Consults called: ***   Admission status: ***     Posey Maier MD [facility] Pager [phone]  If 7PM-7AM, please contact night-coverage [email or web address] Password [facility]  07/20/2024, 11:36 PM       Review of Systems: {ROS_Text:26778} Past Medical History:  Diagnosis Date  . Cancer [facility])    Left breast cancer  . Diastolic congestive heart failure (HCC)    02/05/15  states no fluid in extremities, breathing same as has been  . GERD  (gastroesophageal reflux disease)   . Hepatic steatosis   . History of blood transfusion   . Hx of cholecystectomy   . Hypertension   . Interstitial lung disease (HCC)   . Osteoarthritis   . Pulmonary fibrosis (HCC)   . Raynaud disease   . Rheumatoid arthritis(714.0)   . Right BBB/left ant fasc block   . Scleroderma (HCC)   . Seasonal allergies   . Shortness of breath dyspnea  states normal for her  . Systemic sclerosis [facility])    Past Surgical History:  Procedure Laterality Date  . ABDOMINAL HYSTERECTOMY    . BLADDER SUSPENSION    . BREAST BIOPSY  11/03/2022   MM LT RADIOACTIVE SEED LOC MAMMO GUIDE 11/03/2022 [facility] MAMMOGRAPHY  . BREAST LUMPECTOMY WITH RADIOACTIVE SEED LOCALIZATION Left 11/04/2022   Procedure: LEFT BREAST LUMPECTOMY WITH RADIOACTIVE SEED LOCALIZATION;  Surgeon: Vanderbilt Ned, MD;  Location: [facility] OR;  Service: General;  Laterality: Left;  . CARPAL TUNNEL RELEASE Right 2007  . EYE SURGERY Bilateral 2023   cataract w/ IOL  . LAPAROSCOPIC CHOLECYSTECTOMY    . LEFT HEART CATHETERIZATION WITH CORONARY/GRAFT ANGIOGRAM  03/15/2014   Procedure: LEFT HEART CATHETERIZATION WITH EL BILE;  Surgeon: Lonni JONETTA Cash, MD;  Location: [facility] CATH LAB;  Service: Cardiovascular;;  . REVERSE SHOULDER ARTHROPLASTY Right 05/06/2023   Procedure: REVERSE SHOULDER ARTHROPLASTY;  Surgeon: Melita Drivers, MD;  [facility]S;  Service: Orthopedics;  Laterality: Right;   . TONSILLECTOMY    . TOTAL KNEE ARTHROPLASTY Left 02/11/2015   Procedure: LEFT TOTAL KNEE ARTHROPLASTY, RIGHT KNEE CORTISONE INJECTION;  Surgeon: Dempsey Moan, MD;  Location: [facility];  Service: Orthopedics;  Laterality: Left;  . TOTAL KNEE ARTHROPLASTY Right 10/05/2016   Procedure: RIGHT TOTAL KNEE ARTHROPLASTY;  Surgeon: Dempsey Moan, MD;  [facility];  Service: Orthopedics;  Laterality: Right;  SABRA VESICOVAGINAL FISTULA CLOSURE W/ TAH  11/30/1985   Social History:  reports that she quit  smoking about 35 years ago. Her smoking use included cigarettes. She started smoking about 55 years ago. She has a 20 pack-year smoking history. She has never used smokeless tobacco. She reports that she does not drink alcohol and does not use drugs.  Allergies  Allergen Reactions  . Codeine Other (See Comments)    Neurological problems - hallucinations, spacey    Family History  Problem Relation Age of Onset  . Stroke Mother   . Heart disease Mother        CABG at age 97  . Heart attack Father        Died of MI at age 15  . Emphysema Father   . Dementia Brother   . Osteoarthritis Other   . Celiac disease Other     Prior to Admission medications   Medication Sig Start Date End Date Taking? Authorizing Provider  acetaminophen  (TYLENOL ) 500 MG tablet Take 1,000 mg by mouth every 6 (six) hours as needed for moderate pain.   Yes [provider]  albuterol  (VENTOLIN  HFA) 108 (90 Base) MCG/ACT inhaler INHALE 2 PUFFS INTO THE LUNGS EVERY 6 (SIX) HOURS AS NEEDED FOR WHEEZING. 03/21/21  Yes Geronimo Amel, MD  amLODipine  (NORVASC ) 5 MG tablet Take 5 mg by mouth in the morning.   Yes [provider]  anastrozole  (ARIMIDEX ) 1 MG tablet Take 1 tablet (1 mg total) by mouth daily. 04/14/24  Yes Odean Potts, MD  aspirin  EC 81 MG tablet Take 81 mg by mouth at bedtime.   Yes [provider]  azelastine  (ASTELIN ) 0.1 % nasal spray Place 1 spray into both nostrils 2 (two) times daily as needed for rhinitis. Use in each nostril as directed   Yes [provider]  furosemide  (LASIX ) 40 MG tablet TAKE 2 TABLETS EVERY DAY Patient taking differently: Take 80 mg by mouth daily. 04/12/24  Yes Pietro Redell RAMAN, MD  hydroxychloroquine  (PLAQUENIL ) 200 MG tablet Take 400 mg by mouth in the morning.   Yes  [provider]  nortriptyline (PAMELOR) 10 MG capsule Take 30 mg by mouth at bedtime.   Yes [provider]  pantoprazole  (PROTONIX ) 40 MG tablet Take 40  mg by mouth 2 (two) times daily.   Yes [provider]  predniSONE  (DELTASONE ) 5 MG tablet Take 5 mg by mouth in the morning. 03/31/22  Yes [provider]  zolpidem  (AMBIEN ) 10 MG tablet Take 10 mg by mouth at bedtime.   Yes [provider]    Physical Exam: Vitals:   07/20/24 1713 07/20/24 2100 07/20/24 2128  BP: 139/73 138/64 138/64  Pulse: 96  79  Resp: 16 (!) 22 18  Temp: 97.7 F (36.5 C)  98.7 F (37.1 C)  TempSrc: Oral  Oral  SpO2: 95%  97%   *** Data Reviewed: {Tip this will not be part of the note when signed- Document your independent interpretation of telemetry tracing, EKG, lab, Radiology test or any other diagnostic tests. Add any new diagnostic test ordered today. (Optional):26781} {Results:26384}  Assessment and Plan: No notes have been filed under this hospital service. Service: Hospitalist     Advance Care Planning:   Code Status: Prior ***  Consults: ***  Family Communication: ***  Severity of Illness: {Observation/Inpatient:21159}  Author: Posey Maier, DO 07/20/2024 10:10 PM  For on call review [email or web address].

## 2024-07-21 NOTE — ED Notes (Signed)
 Pt is complaining of pressure under her left breast that just started. Pt states she does have CHF and is wondering if it is all of the fluids. MD Emokpae notified.

## 2024-07-21 NOTE — Plan of Care (Signed)

## 2024-07-21 NOTE — ED Notes (Signed)
 Attempted to obtain potassium phosphate  from [facility], she states that nurses are not allowed to mix anything with potassium. This would either have to wait for pharmacy to mix after 7 am or would have to come from [facility] and would probably take an hour to an hour and a half. Paged provider to make him aware and for him to advise on how to proceed.

## 2024-07-21 NOTE — Progress Notes (Signed)
 Initial Nutrition Assessment  DOCUMENTATION CODES:   Not applicable  INTERVENTION:   -Liberalize diet to 2 gram sodium for wider variety of meal selections -MVI with minerals daily -Ensure Plus High Protein po BID, each supplement provides 350 kcal and 20 grams of protein   NUTRITION DIAGNOSIS:   Increased nutrient needs related to chronic illness (CHF) as evidenced by estimated needs.  GOAL:   Patient will meet greater than or equal to 90% of their needs  MONITOR:   PO intake, Supplement acceptance  REASON FOR ASSESSMENT:   Malnutrition Screening Tool    ASSESSMENT:   Pt with medical history significant of GERD, essential hypertension, osteoarthritis, pulmonary fibrosis, Raynaud's disease, rheumatoid arthritis, scleroderma, seasonal allergies and diastolic CHF, who presented with persistent sepsis.  Pt admitted with SIRS.   Reviewed I/O's: +2 L x 24 hours  Pt unavailable at time of visit. Attempted to speak with pt via call to hospital room phone, however, unable to reach. RD unable to obtain further nutrition-related history or complete nutrition-focused physical exam at this time.    Pt recently hospitalized earlier this month with sepsis secondary to E Coli and UTI. Since hospitalization, she has continued to feel weak and have a poor appetite. Pt asked to return to ED after her cardiology appointment due to heart palpitations and suspected persistent sepsis.   Reviewed wt hx; pt has experienced a 10.2% wt loss over the past 2 months, which is significant for time frame. Given prolonged poor oral intake and significant wt loss, pt is at high risk for malnutrition, however, unable to identify at this time. Pt would greatly benefit from addition fo oral nutrition supplements.   Medications reviewed and include protonix , plaquenulin, protonix , and prednisone .   Lab Results  Component Value Date   HGBA1C 5.9 (H) 07/21/2024   PTA DM medications are none.   Labs  reviewed: K: 2.6, CBGS: 118-169 (inpatient orders for glycemic control are 0-15 units insulin  aspart TID with meals). Suspect steroids are contributing to hyperglycemia. CBGS 208 in ED.   Diet Order:   Diet Order             Diet 2 gram sodium Fluid consistency: Thin  Diet effective now                   EDUCATION NEEDS:   No education needs have been identified at this time  Skin:  Skin Assessment: Reviewed RN Assessment  Last BM:  Unknown  Height:   Ht Readings from Last 1 Encounters:  07/20/24 5' 3 (1.6 m)    Weight:   Wt Readings from Last 1 Encounters:  07/20/24 76.2 kg    Ideal Body Weight:  52.3 kg  BMI:  There is no height or weight on file to calculate BMI.  Estimated Nutritional Needs:   Kcal:  1700-1900  Protein:  100-115 grams  Fluid:  1.7-1.9 L    Margery ORN, RD, LDN, CDCES Registered Dietitian III Certified Diabetes Care and Education Specialist If unable to reach this RD, please use RD Inpatient group chat on secure chat between hours of 8am-4 pm daily

## 2024-07-21 NOTE — Progress Notes (Signed)
 PROGRESS NOTE   [name], is a 78 y.o. female, DOB - 07/20/1946, FMW:[id number]  Admit date - 07/20/2024   Admitting Physician Vickie Maier, DO  Outpatient Primary MD for the patient is Vickie Carwin, MD  LOS - 1  Chief Complaint  Patient presents with   Shortness of Breath      Brief Narrative:  Brief Narrative:  78 y.o. female with medical history significant for GERD, essential hypertension, osteoarthritis, pulmonary fibrosis, Raynaud's disease, rheumatoid arthritis, scleroderma, seasonal allergies and diastolic CHF admitted on 07/20/2024 with presumed sepsis secondary to UTI     -Assessment and Plan: 1)Sepsis due to E. coli and Klebsiella UTI ---POA -Urine culture from 07/10/2024 grew E. coli and Klebsiella -Patient was treated with appropriate antibiotics including IV Rocephin  and p.o. cefadroxil  per sensitivity report of the culture -CT chest, abdomen and pelvis from 07/11/2023 without acute finding without acute findings -Patient with persistent leukocytosis in the setting of chronic steroid therapy -Discharge WBC was 15.0 on 07/13/2024 -Admission WBC 17.1 currently trending down -- Steroid therapy may be contributing to leukocytosis --Patient readmitted on 07/20/2024 with UTI concerns sepsis criteria have been, lactic acidosis noted   2)Rheumatoid arthritis/scleroderma/Raynaud's disease - continue Plaquenil  and Prednisone --   GERD without esophagitis - c/n Protonix  especially while on prednisone    History of breast cancer -continue Arimidex .   HTN -c/n amlodipine    Hypokalemia--replace and recheck Magnesium  WNL   Chronic steroid therapy due to rheumatoid arthritis/scleroderma---  --AM cortisol level 23.9 on 07/11/2024 --Recurrent hospitalization x 2 within the last couple weeks and concerns for ongoing infection okay to increase prednisone  to 10 mg daily from 5 mg daily while hospitalized   Diarrhea--check stool for C. difficile and GI pathogen/culture  NGTD Received IVF   Fatty Liver--CT abdomen and pelvis showed fatty liver infiltration -Follow-up PCP as outpatient   History of breast cancer--continue Arimidex   - History of pulmonary fibrosis--patient follows with [name] - No hypoxia or flareup at this time  Steroid-induced hyperglycemia--Use Novolog /Humalog Sliding scale insulin  with Accu-Cheks/Fingersticks as ordered  - A1c is 5.9  Status is: Inpatient   Disposition: The patient is from: Home              Anticipated d/c is to: Home              Anticipated d/c date is: 2 days              Patient currently is not medically stable to d/c. Barriers: Not Clinically Stable-   Code Status :  -  Code Status: Do not attempt resuscitation (DNR) PRE-ARREST INTERVENTIONS DESIRED   Family Communication:    NA (patient is alert, awake and coherent)   DVT Prophylaxis  :   - SCDs   enoxaparin  (LOVENOX ) injection 40 mg Start: 07/21/24 1000 SCDs Start: 07/21/24 0045   Lab Results  Component Value Date   PLT 361 07/21/2024    Inpatient Medications  Scheduled Meds:  amLODipine   5 mg Oral q AM   anastrozole   1 mg Oral Daily   enoxaparin  (LOVENOX ) injection  40 mg Subcutaneous Q24H   feeding supplement  237 mL Oral BID BM   hydroxychloroquine   400 mg Oral Daily   insulin  aspart  0-15 Units Subcutaneous TID WC   multivitamin with minerals  1 tablet Oral Daily   pantoprazole   40 mg Oral BID   [START ON 07/22/2024] predniSONE   10 mg Oral Q breakfast   Continuous Infusions:  cefTRIAXone  (ROCEPHIN )  IV Stopped (  07/21/24 0936)   lactated ringers  100 mL/hr at 07/21/24 1846   PRN Meds:.acetaminophen  **OR** acetaminophen , ondansetron  **OR** ondansetron  (ZOFRAN ) IV   Anti-infectives (From admission, onward)    Start     Dose/Rate Route Frequency Ordered Stop   07/21/24 1000  cefTRIAXone  (ROCEPHIN ) 1 g in sodium chloride  0.9 % 100 mL IVPB        1 g 200 mL/hr over 30 Minutes Intravenous Every 24 hours 07/21/24 0014      07/21/24 1000  hydroxychloroquine  (PLAQUENIL ) tablet 400 mg        400 mg Oral Daily 07/21/24 0154     07/20/24 1930  cefTRIAXone  (ROCEPHIN ) 2 g in sodium chloride  0.9 % 100 mL IVPB        2 g 200 mL/hr over 30 Minutes Intravenous  Once 07/20/24 1917 07/20/24 2015         Subjective: [name] No today has no fevers, no emesis,  No chest pain,   - Complains of chills on and off but no fevers - Poor appetite noted   Objective: Vitals:   07/21/24 1230 07/21/24 1349 07/21/24 1751 07/21/24 1818  BP: (!) 117/52 (!) 123/58  (!) 128/54  Pulse: 70 79  76  Resp: (!) 23   20  Temp:  97.6 F (36.4 C)  98.5 F (36.9 C)  TempSrc:  Oral  Oral  SpO2: 100% 91%  95%  Weight:   76.5 kg   Height:   5' 3 (1.6 m)     Intake/Output Summary (Last 24 hours) at 07/21/2024 1905 Last data filed at 07/21/2024 1742 Gross per 24 hour  Intake 3010.07 ml  Output --  Net 3010.07 ml   Filed Weights   07/21/24 1751  Weight: 76.5 kg    Physical Exam  Gen:- Awake Alert, in no acute distress HEENT:- [place].AT, No sclera icterus Neck-Supple Neck,No JVD,.  Lungs-Velcro type rales consistent with pulmonary fibrosis, air movement is fair, no wheezing  CV- S1, S2 normal, regular  Abd-  +ve B.Sounds, Abd Soft, No tenderness,   no CVA area tenderness Extremity/Skin:- No  edema, pedal pulses present  Psych-affect is appropriate, oriented x3 Neuro-no new focal deficits, no tremors  Data Reviewed: I have personally reviewed following labs and imaging studies  CBC: Recent Labs  Lab 07/20/24 1731 07/21/24 0052  WBC 17.1* 16.6*  HGB 15.8* 13.8  HCT 46.6* 40.0  MCV 95.7 94.6  PLT 483* 361   Basic Metabolic Panel: Recent Labs  Lab 07/20/24 1731 07/21/24 0052  NA 136 135  K 3.6 2.6*  CL 90* 96*  CO2 30 25  GLUCOSE 203* 151*  BUN 19 13  CREATININE 1.19* 0.84  CALCIUM  9.5 8.1*  MG  --  1.8  PHOS  --  2.4*   GFR: Estimated Creatinine Clearance: 55.8 mL/min (by C-G formula based on SCr of  0.84 mg/dL). Liver Function Tests: Recent Labs  Lab 07/20/24 1731 07/21/24 0052  AST 40 32  ALT 30 23  ALKPHOS 101 76  BILITOT 1.3* 0.5  PROT 7.9 5.9*  ALBUMIN 4.0 2.9*   HbA1C: Recent Labs    07/21/24 0052  HGBA1C 5.9*   Recent Results (from the past 240 hours)  C Difficile Quick Screen w PCR reflex     Status: None   Collection Time: 07/12/24  1:51 PM   Specimen: STOOL  Result Value Ref Range Status   C Diff antigen NEGATIVE NEGATIVE Final   C Diff toxin NEGATIVE NEGATIVE Final  C Diff interpretation No C. difficile detected.  Final    Comment: Performed at [facility], [address], [place], [place] [address]  Gastrointestinal Panel by PCR , Stool     Status: None   Collection Time: 07/12/24  1:51 PM   Specimen: STOOL  Result Value Ref Range Status   Campylobacter species NOT DETECTED NOT DETECTED Final   Plesimonas shigelloides NOT DETECTED NOT DETECTED Final   Salmonella species NOT DETECTED NOT DETECTED Final   Yersinia enterocolitica NOT DETECTED NOT DETECTED Final   Vibrio species NOT DETECTED NOT DETECTED Final   Vibrio cholerae NOT DETECTED NOT DETECTED Final   Enteroaggregative E coli (EAEC) NOT DETECTED NOT DETECTED Final   Enteropathogenic E coli (EPEC) NOT DETECTED NOT DETECTED Final   Enterotoxigenic E coli (ETEC) NOT DETECTED NOT DETECTED Final   Shiga like toxin producing E coli (STEC) NOT DETECTED NOT DETECTED Final   Shigella/Enteroinvasive E coli (EIEC) NOT DETECTED NOT DETECTED Final   Cryptosporidium NOT DETECTED NOT DETECTED Final   Cyclospora cayetanensis NOT DETECTED NOT DETECTED Final   Entamoeba histolytica NOT DETECTED NOT DETECTED Final   Giardia lamblia NOT DETECTED NOT DETECTED Final   Adenovirus F40/41 NOT DETECTED NOT DETECTED Final   Astrovirus NOT DETECTED NOT DETECTED Final   Norovirus GI/GII NOT DETECTED NOT DETECTED Final   Rotavirus A NOT DETECTED NOT DETECTED Final   Sapovirus (I, II, IV, and V) NOT DETECTED NOT DETECTED  Final    Comment: Performed at [facility], [address] [place], [place] [address]  Blood Culture (routine x 2)     Status: None (Preliminary result)   Collection Time: 07/20/24  7:17 PM   Specimen: BLOOD  Result Value Ref Range Status   Specimen Description BLOOD LEFT ANTECUBITAL  Final   Special Requests   Final    AEROBIC BOTTLE ONLY Blood Culture results may not be optimal due to an inadequate volume of blood received in culture bottles   Culture   Final    NO GROWTH < 12 HOURS Performed at [facility], [address], [place], [place] [address]    Report Status PENDING  Incomplete  Blood Culture (routine x 2)     Status: None (Preliminary result)   Collection Time: 07/20/24  7:22 PM   Specimen: BLOOD  Result Value Ref Range Status   Specimen Description BLOOD RIGHT ANTECUBITAL  Final   Special Requests   Final    BOTTLES DRAWN AEROBIC AND ANAEROBIC Blood Culture adequate volume   Culture   Final    NO GROWTH < 12 HOURS Performed at [facility], [address], [place], [place] [address]    Report Status PENDING  Incomplete  Resp panel by RT-PCR (RSV, Flu A&B, Covid) Anterior Nasal Swab     Status: None   Collection Time: 07/20/24  7:56 PM   Specimen: Anterior Nasal Swab  Result Value Ref Range Status   SARS Coronavirus 2 by RT PCR NEGATIVE NEGATIVE Final    Comment: (NOTE) SARS-CoV-2 target nucleic acids are NOT DETECTED.  The SARS-CoV-2 RNA is generally detectable in upper respiratory specimens during the acute phase of infection. The lowest concentration of SARS-CoV-2 viral copies this assay can detect is 138 copies/mL. A negative result does not preclude SARS-Cov-2 infection and should not be used as the sole basis for treatment or other patient management decisions. A negative result may occur with  improper specimen collection/handling, submission of specimen other than nasopharyngeal swab, presence of  viral mutation(s) within the areas targeted by  this assay, and inadequate number of viral copies(<138 copies/mL). A negative result must be combined with clinical observations, patient history, and epidemiological information. The expected result is Negative.  Fact Sheet for Patients:  BloggerCourse.com  Fact Sheet for Healthcare Providers:  SeriousBroker.it  This test is no t yet approved or cleared by the [place]  FDA and  has been authorized for detection and/or diagnosis of SARS-CoV-2 by FDA under an Emergency Use Authorization (EUA). This EUA will remain  in effect (meaning this test can be used) for the duration of the COVID-19 declaration under Section 564(b)(1) of the Act, 21 U.S.C.section 360bbb-3(b)(1), unless the authorization is terminated  or revoked sooner.       Influenza A by PCR NEGATIVE NEGATIVE Final   Influenza B by PCR NEGATIVE NEGATIVE Final    Comment: (NOTE) The Xpert Xpress SARS-CoV-2/FLU/RSV plus assay is intended as an aid in the diagnosis of influenza from Nasopharyngeal swab specimens and should not be used as a sole basis for treatment. Nasal washings and aspirates are unacceptable for Xpert Xpress SARS-CoV-2/FLU/RSV testing.  Fact Sheet for Patients: BloggerCourse.com  Fact Sheet for Healthcare Providers: SeriousBroker.it  This test is not yet approved or cleared by the [place]  FDA and has been authorized for detection and/or diagnosis of SARS-CoV-2 by FDA under an Emergency Use Authorization (EUA). This EUA will remain in effect (meaning this test can be used) for the duration of the COVID-19 declaration under Section 564(b)(1) of the Act, 21 U.S.C. section 360bbb-3(b)(1), unless the authorization is terminated or revoked.     Resp Syncytial Virus by PCR NEGATIVE NEGATIVE Final    Comment: (NOTE) Fact Sheet for Patients: BloggerCourse.com  Fact Sheet  for Healthcare Providers: SeriousBroker.it  This test is not yet approved or cleared by the [place]  FDA and has been authorized for detection and/or diagnosis of SARS-CoV-2 by FDA under an Emergency Use Authorization (EUA). This EUA will remain in effect (meaning this test can be used) for the duration of the COVID-19 declaration under Section 564(b)(1) of the Act, 21 U.S.C. section 360bbb-3(b)(1), unless the authorization is terminated or revoked.  Performed at [facility], [address], [place], [place] [address]     Radiology Studies: [facility] Chest [facility] 1 View Result Date: 07/20/2024 CLINICAL DATA:  Questionable sepsis.  Check for pneumonia. EXAM: PORTABLE CHEST 1 VIEW COMPARISON:  Portable chest 03/16/2021. FINDINGS: There is mild cardiomegaly with no evidence of CHF. The mediastinum is normally outlined. There is scarring and tenting along the left hemidiaphragm, chronic. The lungs are hypoinflated with chronic moderate elevation of the right hemidiaphragm. The visualized lungs are clear with limited view of the right base. There is a right shoulder reverse arthroplasty, osteopenia and thoracic spondylosis, slight dextroscoliosis. Multiple overlying telemetry leads.  Left axillary surgical clips. IMPRESSION: 1. No evidence of acute chest disease. Limited view of the right base due to chronic elevation of the right hemidiaphragm. 2. Mild cardiomegaly. Electronically Signed   By: Francis Quam M.D.   On: 07/20/2024 20:03   Scheduled Meds:  amLODipine   5 mg Oral q AM   anastrozole   1 mg Oral Daily   enoxaparin  (LOVENOX ) injection  40 mg Subcutaneous Q24H   feeding supplement  237 mL Oral BID BM   hydroxychloroquine   400 mg Oral Daily   insulin  aspart  0-15 Units Subcutaneous TID WC   multivitamin with minerals  1 tablet Oral Daily   pantoprazole   40 mg  Oral BID   [START ON 07/22/2024] predniSONE   10 mg Oral Q breakfast   Continuous Infusions:  cefTRIAXone   (ROCEPHIN )  IV Stopped (07/21/24 0936)   lactated ringers  100 mL/hr at 07/21/24 1846    LOS: 1 day   Rendall [name] M.D on 07/21/2024 at 7:05 PM  Go to [email or web address] - for contact info  [facility] - Office  [phone]  If 7PM-7AM, please contact night-coverage [email or web address] 07/21/2024, 7:05 PM

## 2024-07-21 NOTE — ED Notes (Signed)
Pt ambulated to the bathroom with stand by assistance.

## 2024-07-21 NOTE — ED Notes (Signed)
Patient up to recliner for comfort.  

## 2024-07-21 NOTE — ED Notes (Signed)
 Per provider this medication can wait til morning when a pharmacists is available to mix. AC made aware.

## 2024-07-21 NOTE — Progress Notes (Signed)
   07/21/24 0757  TOC Brief Assessment  Insurance and Status Reviewed  Patient has primary care physician Yes  Home environment has been reviewed Home with Spouse  Prior level of function: Independent  Prior/Current Home Services No current home services  Social Drivers of Health Review SDOH reviewed no interventions necessary  Readmission risk has been reviewed Yes  Transition of care needs no transition of care needs at this time   Transition of Care Department [facility]) has reviewed patient and no TOC needs have been identified at this time. We will continue to monitor patient advancement through interdisciplinary progression rounds. If new patient transition needs arise, please place a TOC consult.

## 2024-07-21 NOTE — ED Notes (Signed)
 Pharmacy is aware that we do not have the potassium phosphate .

## 2024-07-22 DIAGNOSIS — E041 Nontoxic single thyroid nodule: Secondary | ICD-10-CM | POA: Diagnosis present

## 2024-07-22 DIAGNOSIS — R6510 Systemic inflammatory response syndrome (SIRS) of non-infectious origin without acute organ dysfunction: Secondary | ICD-10-CM | POA: Diagnosis not present

## 2024-07-22 LAB — URINE CULTURE: Culture: NO GROWTH

## 2024-07-22 LAB — CBC
HCT: 42.9 % (ref 36.0–46.0)
Hemoglobin: 14.5 g/dL (ref 12.0–15.0)
MCH: 32.9 pg (ref 26.0–34.0)
MCHC: 33.8 g/dL (ref 30.0–36.0)
MCV: 97.3 fL (ref 80.0–100.0)
Platelets: 362 K/uL (ref 150–400)
RBC: 4.41 MIL/uL (ref 3.87–5.11)
RDW: 13.1 % (ref 11.5–15.5)
WBC: 14.6 K/uL — ABNORMAL HIGH (ref 4.0–10.5)
nRBC: 0 % (ref 0.0–0.2)

## 2024-07-22 LAB — BASIC METABOLIC PANEL WITH GFR
Anion gap: 13 (ref 5–15)
BUN: 7 mg/dL — ABNORMAL LOW (ref 8–23)
CO2: 28 mmol/L (ref 22–32)
Calcium: 9.1 mg/dL (ref 8.9–10.3)
Chloride: 101 mmol/L (ref 98–111)
Creatinine, Ser: 0.72 mg/dL (ref 0.44–1.00)
GFR, Estimated: >60 mL/min (ref >60)
Glucose, Bld: 120 mg/dL — ABNORMAL HIGH (ref 70–99)
Potassium: 3.4 mmol/L — ABNORMAL LOW (ref 3.5–5.1)
Sodium: 142 mmol/L (ref 135–145)

## 2024-07-22 LAB — GLUCOSE, CAPILLARY: Glucose-Capillary: 123 mg/dL — ABNORMAL HIGH (ref 70–99)

## 2024-07-22 MED ORDER — ZOLPIDEM TARTRATE 5 MG PO TABS: 5.0000 mg | ORAL_TABLET | Freq: Every day | ORAL | Status: DC

## 2024-07-22 MED ORDER — ALBUTEROL SULFATE HFA 108 (90 BASE) MCG/ACT IN AERS: 2.0000 | INHALATION_SPRAY | Freq: Four times a day (QID) | RESPIRATORY_TRACT | 1 refills | Status: AC | PRN

## 2024-07-22 MED ORDER — ASPIRIN 81 MG PO TBEC: 81.0000 mg | DELAYED_RELEASE_TABLET | Freq: Every day | ORAL | 11 refills | Status: AC

## 2024-07-22 MED ORDER — POTASSIUM CHLORIDE CRYS ER 20 MEQ PO TBCR
40.0000 meq | EXTENDED_RELEASE_TABLET | Freq: Once | ORAL | Status: AC
  Administered 2024-07-22: 40 meq via ORAL
  Filled 2024-07-22: qty 2

## 2024-07-22 NOTE — Plan of Care (Signed)
 Problem: Education: Goal: Ability to describe self-care measures that may prevent or decrease complications (Diabetes Survival Skills Education) will improve 07/22/2024 1613 by Mathews Norleen POUR, RN Outcome: Adequate for Discharge 07/22/2024 0745 by Mathews Norleen POUR, RN Outcome: Progressing Goal: Individualized Educational Video(s) 07/22/2024 1613 by Mathews Norleen POUR, RN Outcome: Adequate for Discharge 07/22/2024 0745 by Mathews Norleen POUR, RN Outcome: Progressing   Problem: Coping: Goal: Ability to adjust to condition or change in health will improve 07/22/2024 1613 by Mathews Norleen POUR, RN Outcome: Adequate for Discharge 07/22/2024 0745 by Mathews Norleen POUR, RN Outcome: Progressing   Problem: Fluid Volume: Goal: Ability to maintain a balanced intake and output will improve 07/22/2024 1613 by Mathews Norleen POUR, RN Outcome: Adequate for Discharge 07/22/2024 0745 by Mathews Norleen POUR, RN Outcome: Progressing   Problem: Health Behavior/Discharge Planning: Goal: Ability to identify and utilize available resources and services will improve 07/22/2024 1613 by Mathews Norleen POUR, RN Outcome: Adequate for Discharge 07/22/2024 0745 by Mathews Norleen POUR, RN Outcome: Progressing Goal: Ability to manage health-related needs will improve 07/22/2024 1613 by Mathews Norleen POUR, RN Outcome: Adequate for Discharge 07/22/2024 0745 by Mathews Norleen POUR, RN Outcome: Progressing   Problem: Metabolic: Goal: Ability to maintain appropriate glucose levels will improve 07/22/2024 1613 by Mathews Norleen POUR, RN Outcome: Adequate for Discharge 07/22/2024 0745 by Mathews Norleen POUR, RN Outcome: Progressing   Problem: Nutritional: Goal: Maintenance of adequate nutrition will improve 07/22/2024 1613 by Mathews Norleen POUR, RN Outcome: Adequate for Discharge 07/22/2024 0745 by Mathews Norleen POUR, RN Outcome: Progressing Goal: Progress toward achieving an optimal weight will improve 07/22/2024 1613 by Mathews Norleen POUR, RN Outcome: Adequate for Discharge 07/22/2024 0745 by Mathews Norleen POUR, RN Outcome: Progressing   Problem: Skin Integrity: Goal: Risk for impaired skin integrity will decrease 07/22/2024 1613 by Mathews Norleen POUR, RN Outcome: Adequate for Discharge 07/22/2024 0745 by Mathews Norleen POUR, RN Outcome: Progressing   Problem: Tissue Perfusion: Goal: Adequacy of tissue perfusion will improve 07/22/2024 1613 by Mathews Norleen POUR, RN Outcome: Adequate for Discharge 07/22/2024 0745 by Mathews Norleen POUR, RN Outcome: Progressing   Problem: Education: Goal: Knowledge of General Education information will improve Description: Including pain rating scale, medication(s)/side effects and non-pharmacologic comfort measures 07/22/2024 1613 by Mathews Norleen POUR, RN Outcome: Adequate for Discharge 07/22/2024 0745 by Mathews Norleen POUR, RN Outcome: Progressing   Problem: Health Behavior/Discharge Planning: Goal: Ability to manage health-related needs will improve 07/22/2024 1613 by Mathews Norleen POUR, RN Outcome: Adequate for Discharge 07/22/2024 0745 by Mathews Norleen POUR, RN Outcome: Progressing   Problem: Clinical Measurements: Goal: Ability to maintain clinical measurements within normal limits will improve 07/22/2024 1613 by Mathews Norleen POUR, RN Outcome: Adequate for Discharge 07/22/2024 0745 by Mathews Norleen POUR, RN Outcome: Progressing Goal: Will remain free from infection 07/22/2024 1613 by Mathews Norleen POUR, RN Outcome: Adequate for Discharge 07/22/2024 0745 by Mathews Norleen POUR, RN Outcome: Progressing Goal: Diagnostic test results will improve 07/22/2024 1613 by Mathews Norleen POUR, RN Outcome: Adequate for Discharge 07/22/2024 0745 by Mathews Norleen POUR, RN Outcome: Progressing Goal: Respiratory complications will improve 07/22/2024 1613 by Mathews Norleen POUR, RN Outcome: Adequate for Discharge 07/22/2024 0745 by Mathews Norleen POUR, RN Outcome: Progressing Goal: Cardiovascular complication will be avoided 07/22/2024 1613 by Mathews Norleen POUR, RN Outcome: Adequate for Discharge 07/22/2024 0745 by Mathews Norleen POUR, RN Outcome:  Progressing   Problem: Activity: Goal: Risk for activity intolerance will decrease 07/22/2024 1613 by Mathews Norleen POUR, RN Outcome: Adequate for Discharge  07/22/2024 0745 by Mathews Norleen POUR, RN Outcome: Progressing   Problem: Nutrition: Goal: Adequate nutrition will be maintained 07/22/2024 1613 by Mathews Norleen POUR, RN Outcome: Adequate for Discharge 07/22/2024 0745 by Mathews Norleen POUR, RN Outcome: Progressing   Problem: Coping: Goal: Level of anxiety will decrease 07/22/2024 1613 by Mathews Norleen POUR, RN Outcome: Adequate for Discharge 07/22/2024 0745 by Mathews Norleen POUR, RN Outcome: Progressing   Problem: Elimination: Goal: Will not experience complications related to bowel motility 07/22/2024 1613 by Mathews Norleen POUR, RN Outcome: Adequate for Discharge 07/22/2024 0745 by Mathews Norleen POUR, RN Outcome: Progressing Goal: Will not experience complications related to urinary retention 07/22/2024 1613 by Mathews Norleen POUR, RN Outcome: Adequate for Discharge 07/22/2024 0745 by Mathews Norleen POUR, RN Outcome: Progressing   Problem: Pain Managment: Goal: General experience of comfort will improve and/or be controlled 07/22/2024 1613 by Mathews Norleen POUR, RN Outcome: Adequate for Discharge 07/22/2024 0745 by Mathews Norleen POUR, RN Outcome: Progressing   Problem: Safety: Goal: Ability to remain free from injury will improve 07/22/2024 1613 by Mathews Norleen POUR, RN Outcome: Adequate for Discharge 07/22/2024 0745 by Mathews Norleen POUR, RN Outcome: Progressing   Problem: Skin Integrity: Goal: Risk for impaired skin integrity will decrease 07/22/2024 1613 by Mathews Norleen POUR, RN Outcome: Adequate for Discharge 07/22/2024 0745 by Mathews Norleen POUR, RN Outcome: Progressing

## 2024-07-22 NOTE — Plan of Care (Signed)

## 2024-07-22 NOTE — Plan of Care (Signed)
  Problem: Education: Goal: Ability to describe self-care measures that may prevent or decrease complications (Diabetes Survival Skills Education) will improve Outcome: Progressing Goal: Individualized Educational Video(s) Outcome: Progressing   Problem: Coping: Goal: Ability to adjust to condition or change in health will improve Outcome: Progressing   Problem: Skin Integrity: Goal: Risk for impaired skin integrity will decrease Outcome: Progressing   Problem: Tissue Perfusion: Goal: Adequacy of tissue perfusion will improve Outcome: Progressing   Problem: Education: Goal: Knowledge of General Education information will improve Description: Including pain rating scale, medication(s)/side effects and non-pharmacologic comfort measures Outcome: Progressing   Problem: Clinical Measurements: Goal: Ability to maintain clinical measurements within normal limits will improve Outcome: Progressing Goal: Will remain free from infection Outcome: Progressing Goal: Diagnostic test results will improve Outcome: Progressing

## 2024-07-22 NOTE — Discharge Instructions (Addendum)
 1)Please Follow up with Gastroenterologist Dr. Jenne to concerns about Liver Cirrhosis and hepatic steatosis at address [address], [place] [address],,[place] Number [phone]     2)Avoid ibuprofen/Advil/Aleve/Motrin/Goody Powders/Naproxen/BC powders/Meloxicam/Diclofenac/Indomethacin and other Nonsteroidal anti-inflammatory medications as these will make you more likely to bleed and can cause stomach ulcers, can also cause Kidney problems.   3) please follow-up with Dr. Geronimo for ongoing management of your pulmonary fibrosis  4) increase prednisone  to 10 mg daily on 07/23/2024 and 07/24/2024 and then back down to 5 mg daily starting 07/25/2024  5)Stable right thyroid nodule.  Outpatient thyroid ultrasound requested

## 2024-07-22 NOTE — Discharge Summary (Signed)
 [name], is a 78 y.o. female  DOB Sep 03, 1946  MRN [id number].  Admission date:  07/20/2024  Admitting Physician  Posey Maier, DO  Discharge Date:  07/22/2024   Primary MD  Sheryle Carwin, MD  Recommendations for primary care physician for things to follow:   1)Please Follow up with Gastroenterologist Dr. Jenne to concerns about Liver Cirrhosis and hepatic steatosis at address [address], [place] [address],,[place] Number [phone]     2)Avoid ibuprofen/Advil/Aleve/Motrin/Goody Powders/Naproxen/BC powders/Meloxicam/Diclofenac/Indomethacin and other Nonsteroidal anti-inflammatory medications as these will make you more likely to bleed and can cause stomach ulcers, can also cause Kidney problems.   3) please follow-up with Dr. Geronimo for ongoing management of your pulmonary fibrosis  4) increase prednisone  to 10 mg daily on 07/23/2024 and 07/24/2024 and then back down to 5 mg daily starting 07/25/2024  5)Stable right thyroid nodule.  Outpatient thyroid ultrasound requested  Admission Diagnosis  Generalized weakness [R53.1] Acute sepsis (HCC) [A41.9]   Discharge Diagnosis  Generalized weakness [R53.1] Acute sepsis (HCC) [A41.9]    Principal Problem:   SIRS (systemic inflammatory response syndrome) (HCC) Active Problems:   Thyroid nodule   Essential hypertension   History of breast cancer   GERD without esophagitis   Generalized weakness   Lactic acid acidosis   Hyperglycemia      Past Medical History:  Diagnosis Date   Cancer (HCC)    Left breast cancer   Diastolic congestive heart failure (HCC)    02/05/15  states no fluid in extremities, breathing same as has been   GERD (gastroesophageal reflux disease)    Hepatic steatosis    History of blood transfusion    Hx of cholecystectomy    Hypertension    Interstitial lung disease (HCC)    Osteoarthritis     Pulmonary fibrosis (HCC)    Raynaud disease    Rheumatoid arthritis(714.0)    Right BBB/left ant fasc block    Scleroderma (HCC)    Seasonal allergies    Shortness of breath dyspnea    states normal for her   Systemic sclerosis [facility])     Past Surgical History:  Procedure Laterality Date   ABDOMINAL HYSTERECTOMY     BLADDER SUSPENSION     BREAST BIOPSY  11/03/2022   MM LT RADIOACTIVE SEED LOC MAMMO GUIDE 11/03/2022 [facility] MAMMOGRAPHY   BREAST LUMPECTOMY WITH RADIOACTIVE SEED LOCALIZATION Left 11/04/2022   Procedure: LEFT BREAST LUMPECTOMY WITH RADIOACTIVE SEED LOCALIZATION;  Surgeon: Vanderbilt Ned, MD;  Location: [facility] OR;  Service: General;  Laterality: Left;   CARPAL TUNNEL RELEASE Right 2007   EYE SURGERY Bilateral 2023   cataract w/ IOL   LAPAROSCOPIC CHOLECYSTECTOMY     LEFT HEART CATHETERIZATION WITH CORONARY/GRAFT ANGIOGRAM  03/15/2014   Procedure: LEFT HEART CATHETERIZATION WITH EL BILE;  Surgeon: Lonni JONETTA Cash, MD;  Location: [facility] CATH LAB;  Service: Cardiovascular;;   REVERSE SHOULDER ARTHROPLASTY Right 05/06/2023   Procedure: REVERSE SHOULDER ARTHROPLASTY;  Surgeon: Melita Drivers, MD;  Location: [facility] ORS;  Service:  Orthopedics;  Laterality: Right;    TONSILLECTOMY     TOTAL KNEE ARTHROPLASTY Left 02/11/2015   Procedure: LEFT TOTAL KNEE ARTHROPLASTY, RIGHT KNEE CORTISONE INJECTION;  Surgeon: Dempsey Moan, MD;  Location: [facility] ORS;  Service: Orthopedics;  Laterality: Left;   TOTAL KNEE ARTHROPLASTY Right 10/05/2016   Procedure: RIGHT TOTAL KNEE ARTHROPLASTY;  Surgeon: Dempsey Moan, MD;  Location: [facility] ORS;  Service: Orthopedics;  Laterality: Right;   VESICOVAGINAL FISTULA CLOSURE W/ TAH  11/30/1985       HPI  from the history and physical done on the day of admission:   HPI: [name] is a 78 y.o. female with medical history significant of GERD, essential hypertension, osteoarthritis, pulmonary fibrosis, Raynaud's disease, rheumatoid  arthritis, scleroderma, seasonal allergies and diastolic CHF, who presented to the emergency department after being asked to go to the ED by her cardiologist. Patient was recently admitted from 8/11 to 8/14 due to sepsis secondary to E. coli and Klebsiella UTI and she was treated with Rocephin  and discharged home on cefadroxil .  However, patient states that since she returned home she has continued to feel weak and has been having poor appetite with decreased oral intake.  She went to see her cardiologist today due to sensation of skipping heartbeat/palpitation and after being examined, it was suspected that she continues to have persistent sepsis and was told to go to the ED for further management.   ED Course:  In the emergency department, she was hemodynamically stable.  Workup in the ED showed CBC showed WBC of 17.1, hemoglobin 15.8, hematocrit 46.6, MCV 95.7, platelets 43.  Normal BMP except for chloride of 90, creatinine 1.19 (baseline creatinine at 0.7 to 0.9) and blood glucose of 203.  Lactic acid 2.5.  Urinalysis showed small leukocytes with rare bacteria.  Influenza A, B, SARS-CoV-2, RSV was negative. Chest x-ray showed no evidence of acute chest disease She was treated with IV ceftriaxone , IV hydration was provided. [facility] was asked to admit patient   Review of Systems: Review of systems as noted in the HPI. All other systems reviewed and are negative.     Hospital Course:   Brief Narrative:  78 y.o. female with medical history significant for GERD, essential hypertension, osteoarthritis, pulmonary fibrosis, Raynaud's disease, rheumatoid arthritis, scleroderma, seasonal allergies and diastolic CHF admitted on 07/20/2024 with presumed sepsis secondary to UTI     -Assessment and Plan: 1)Recent E. coli and Klebsiella UTI ---POA -Urine culture from 07/10/2024 grew E. coli and Klebsiella -Patient was treated with appropriate antibiotics including IV Rocephin  and p.o. cefadroxil  per  sensitivity report of the culture -CT chest, abdomen and pelvis from 07/11/2023 without acute finding without acute findings -Patient with persistent leukocytosis in the setting of chronic steroid therapy -Discharge WBC was 15.0 on 07/13/2024 -Admission WBC 17.1 on 07/20/2024  - WBC currently down to 14.6 at discharge today 07/22/2024  -- Steroid therapy may be contributing to leukocytosis --Patient readmitted on 07/20/2024 with UTI concerns  --Urine cultures from 07/20/2024 negative this time around -Does Not meet sepsis criteria--- patient is already out for sepsis this admission (patient had sepsis last admission couple weeks ago)  2)Rheumatoid arthritis/scleroderma/Raynaud's disease - continue Plaquenil  and Prednisone --   GERD without esophagitis - c/n Protonix  especially while on prednisone    History of breast cancer -continue Arimidex .   HTN -c/n amlodipine    Hypokalemia--replace  Magnesium  WNL   Chronic steroid therapy due to rheumatoid arthritis/scleroderma---  --AM cortisol level 23.9 on 07/11/2024 --Recurrent hospitalization x 2  within the last couple weeks and concerns for ongoing infection okay to increase prednisone  to 10 mg daily from 5 mg daily while hospitalized   Diarrhea--no further diarrhea  Fatty Liver--CT abdomen and pelvis showed fatty liver infiltration -Right upper quadrant ultrasound dated 06/21/2024 with suggestion of possible liver cirrhosis --Outpatient follow-up with Dr. Marchia team advised  History of breast cancer--continue Arimidex    - History of pulmonary fibrosis--patient follows with Dr. Geronimo - No hypoxia or flareup at this time   Steroid-induced hyperglycemia--- A1c is 5.9   Disposition: The patient is from: Home              Anticipated d/c is to: Home  Discharge Condition: Stable  Follow UP   Follow-up Information     Eartha Angelia Sieving, MD. Call in 1 week(s).   Specialty: Gastroenterology Why: Liver  Cirrhosis Contact information: [address] [place] [address] [phone]                 Diet and Activity recommendation:  As advised  Discharge Instructions    Discharge Instructions     Call MD for:  difficulty breathing, headache or visual disturbances   Complete by: As directed    Call MD for:  persistant dizziness or light-headedness   Complete by: As directed    Call MD for:  persistant nausea and vomiting   Complete by: As directed    Call MD for:  temperature >100.4   Complete by: As directed    Diet - low sodium heart healthy   Complete by: As directed    Discharge instructions   Complete by: As directed    1)Please Follow up with Gastroenterologist Dr. Jenne to concerns about Liver Cirrhosis and hepatic steatosis at address [address], [place] [address],,[place] Number [phone]     2)Avoid ibuprofen/Advil/Aleve/Motrin/Goody Powders/Naproxen/BC powders/Meloxicam/Diclofenac/Indomethacin and other Nonsteroidal anti-inflammatory medications as these will make you more likely to bleed and can cause stomach ulcers, can also cause Kidney problems.   3) please follow-up with Dr. Geronimo for ongoing management of your pulmonary fibrosis  4) increase prednisone  to 10 mg daily on 07/23/2024 and 07/24/2024 and then back down to 5 mg daily starting 07/25/2024  5)Stable right thyroid nodule.  Outpatient thyroid ultrasound requested   Increase activity slowly   Complete by: As directed          Discharge Medications     Allergies as of 07/22/2024       Reactions   Codeine Other (See Comments)   Neurological problems - hallucinations, spacey        Medication List     TAKE these medications    acetaminophen  500 MG tablet Commonly known as: TYLENOL  Take 1,000 mg by mouth every 6 (six) hours as needed for moderate pain.   albuterol  108 (90 Base) MCG/ACT inhaler Commonly known as: VENTOLIN  HFA Inhale  2 puffs into the lungs every 6 (six) hours as needed for wheezing.   amLODipine  5 MG tablet Commonly known as: NORVASC  Take 5 mg by mouth in the morning.   anastrozole  1 MG tablet Commonly known as: ARIMIDEX  Take 1 tablet (1 mg total) by mouth daily.   aspirin  EC 81 MG tablet Take 1 tablet (81 mg total) by mouth daily with breakfast. What changed: when to take this   azelastine  0.1 % nasal spray Commonly known as: ASTELIN  Place 1 spray into both nostrils 2 (two) times daily as needed for rhinitis.  Use in each nostril as directed   furosemide  40 MG tablet Commonly known as: LASIX  TAKE 2 TABLETS EVERY DAY What changed:  how much to take how to take this when to take this additional instructions   hydroxychloroquine  200 MG tablet Commonly known as: PLAQUENIL  Take 400 mg by mouth in the morning.   nortriptyline 10 MG capsule Commonly known as: PAMELOR Take 30 mg by mouth at bedtime.   pantoprazole  40 MG tablet Commonly known as: PROTONIX  Take 40 mg by mouth 2 (two) times daily.   predniSONE  5 MG tablet Commonly known as: DELTASONE  Take 5 mg by mouth in the morning.   zolpidem  10 MG tablet Commonly known as: AMBIEN  Take 10 mg by mouth at bedtime.         Major procedures and Radiology Reports - PLEASE review detailed and final reports for all details, in brief -   DG Chest Port 1 View Result Date: 07/20/2024 CLINICAL DATA:  Questionable sepsis.  Check for pneumonia. EXAM: PORTABLE CHEST 1 VIEW COMPARISON:  Portable chest 03/16/2021. FINDINGS: There is mild cardiomegaly with no evidence of CHF. The mediastinum is normally outlined. There is scarring and tenting along the left hemidiaphragm, chronic. The lungs are hypoinflated with chronic moderate elevation of the right hemidiaphragm. The visualized lungs are clear with limited view of the right base. There is a right shoulder reverse arthroplasty, osteopenia and thoracic spondylosis, slight dextroscoliosis. Multiple  overlying telemetry leads.  Left axillary surgical clips. IMPRESSION: 1. No evidence of acute chest disease. Limited view of the right base due to chronic elevation of the right hemidiaphragm. 2. Mild cardiomegaly. Electronically Signed   By: Francis Quam M.D.   On: 07/20/2024 20:03   CT CHEST ABDOMEN PELVIS W CONTRAST Result Date: 07/10/2024 CLINICAL DATA:  Sepsis. EXAM: CT CHEST, ABDOMEN, AND PELVIS WITH CONTRAST TECHNIQUE: Multidetector CT imaging of the chest, abdomen and pelvis was performed following the standard protocol during bolus administration of intravenous contrast. RADIATION DOSE REDUCTION: This exam was performed according to the departmental dose-optimization program which includes automated exposure control, adjustment of the mA and/or kV according to patient size and/or use of iterative reconstruction technique. CONTRAST:  OMNIPAQUE  IOHEXOL  300 MG/ML  SOLN COMPARISON:  CT chest 05/03/2024.  CT abdomen and pelvis 02/05/2021. FINDINGS: CT CHEST FINDINGS Cardiovascular: Heart is mildly enlarged. Aorta is normal in size. There are atherosclerotic calcifications of the aorta. There is no pericardial effusion. Mediastinum/Nodes: There are no enlarged mediastinal or hilar lymph nodes identified. Visualized esophagus is within normal limits. There is a heterogeneous 2.5 cm right thyroid nodule, grossly unchanged. Lungs/Pleura: Fibrotic changes are again seen in both lungs most prominent in the right middle lobe, lingula and lung bases. There is been no significant interval change. There is no new focal lung infiltrate, pleural effusion or pneumothorax. Musculoskeletal: There surgical clips and scarring in the left breast, unchanged. No acute bony abnormality identified. Degenerative changes affect the spine. Right shoulder arthroplasty is present. CT ABDOMEN PELVIS FINDINGS Hepatobiliary: There is diffuse fatty infiltration of the liver. There is enlargement of the caudate lobe, unchanged. No  focal liver lesion identified. Gallbladder is surgically absent. There is no biliary ductal dilatation. Pancreas: Unremarkable. No pancreatic ductal dilatation or surrounding inflammatory changes. Spleen: Normal in size without focal abnormality. Adrenals/Urinary Tract: There is a 2 mm calculus in each kidney. There is no hydronephrosis or perinephric fat stranding. The adrenal glands and bladder are within normal limits. Stomach/Bowel: Stomach is within normal limits. Appendix appears  normal. No evidence of bowel wall thickening, distention, or inflammatory changes. There is scattered sigmoid colon diverticula. Vascular/Lymphatic: Aortic atherosclerosis. No enlarged abdominal or pelvic lymph nodes. Reproductive: Status post hysterectomy. No adnexal masses. Other: There is a small fat containing umbilical hernia. No ascites. Musculoskeletal: Bones are osteopenic. There is mild chronic appearing compression deformity of L4. IMPRESSION: 1. No acute localizing process in the chest, abdomen or pelvis. 2. Stable fibrotic changes in the lungs. 3. Stable right thyroid nodule. Thyroid ultrasound recommended if not performed previously. 4. Fatty infiltration of the liver. 5. Nonobstructing bilateral renal calculi. 6. Sigmoid colon diverticulosis. Aortic Atherosclerosis (ICD10-I70.0). Electronically Signed   By: Greig Pique M.D.   On: 07/10/2024 21:10    Micro Results   Recent Results (from the past 240 hours)  Blood Culture (routine x 2)     Status: None (Preliminary result)   Collection Time: 07/20/24  7:17 PM   Specimen: BLOOD  Result Value Ref Range Status   Specimen Description BLOOD LEFT ANTECUBITAL  Final   Special Requests   Final    AEROBIC BOTTLE ONLY Blood Culture results may not be optimal due to an inadequate volume of blood received in culture bottles   Culture   Final    NO GROWTH < 12 HOURS Performed at [facility], [address] [place], [place] [address]    Report Status PENDING   Incomplete  Blood Culture (routine x 2)     Status: None (Preliminary result)   Collection Time: 07/20/24  7:22 PM   Specimen: BLOOD  Result Value Ref Range Status   Specimen Description BLOOD RIGHT ANTECUBITAL  Final   Special Requests   Final    BOTTLES DRAWN AEROBIC AND ANAEROBIC Blood Culture adequate volume   Culture   Final    NO GROWTH < 12 HOURS Performed at [facility], [address], [place], [place] [address]    Report Status PENDING  Incomplete  Resp panel by RT-PCR (RSV, Flu A&B, Covid) Anterior Nasal Swab     Status: None   Collection Time: 07/20/24  7:56 PM   Specimen: Anterior Nasal Swab  Result Value Ref Range Status   SARS Coronavirus 2 by RT PCR NEGATIVE NEGATIVE Final    Comment: (NOTE) SARS-CoV-2 target nucleic acids are NOT DETECTED.  The SARS-CoV-2 RNA is generally detectable in upper respiratory specimens during the acute phase of infection. The lowest concentration of SARS-CoV-2 viral copies this assay can detect is 138 copies/mL. A negative result does not preclude SARS-Cov-2 infection and should not be used as the sole basis for treatment or other patient management decisions. A negative result may occur with  improper specimen collection/handling, submission of specimen other than nasopharyngeal swab, presence of viral mutation(s) within the areas targeted by this assay, and inadequate number of viral copies(<138 copies/mL). A negative result must be combined with clinical observations, patient history, and epidemiological information. The expected result is Negative.  Fact Sheet for Patients:  BloggerCourse.com  Fact Sheet for Healthcare Providers:  SeriousBroker.it  This test is no t yet approved or cleared by the [place]  FDA and  has been authorized for detection and/or diagnosis of SARS-CoV-2 by FDA under an Emergency Use Authorization (EUA). This EUA will remain  in effect (meaning this  test can be used) for the duration of the COVID-19 declaration under Section 564(b)(1) of the Act, 21 U.S.C.section 360bbb-3(b)(1), unless the authorization is terminated  or revoked sooner.       Influenza A  by PCR NEGATIVE NEGATIVE Final   Influenza B by PCR NEGATIVE NEGATIVE Final    Comment: (NOTE) The Xpert Xpress SARS-CoV-2/FLU/RSV plus assay is intended as an aid in the diagnosis of influenza from Nasopharyngeal swab specimens and should not be used as a sole basis for treatment. Nasal washings and aspirates are unacceptable for Xpert Xpress SARS-CoV-2/FLU/RSV testing.  Fact Sheet for Patients: BloggerCourse.com  Fact Sheet for Healthcare Providers: SeriousBroker.it  This test is not yet approved or cleared by the [place]  FDA and has been authorized for detection and/or diagnosis of SARS-CoV-2 by FDA under an Emergency Use Authorization (EUA). This EUA will remain in effect (meaning this test can be used) for the duration of the COVID-19 declaration under Section 564(b)(1) of the Act, 21 U.S.C. section 360bbb-3(b)(1), unless the authorization is terminated or revoked.     Resp Syncytial Virus by PCR NEGATIVE NEGATIVE Final    Comment: (NOTE) Fact Sheet for Patients: BloggerCourse.com  Fact Sheet for Healthcare Providers: SeriousBroker.it  This test is not yet approved or cleared by the [place]  FDA and has been authorized for detection and/or diagnosis of SARS-CoV-2 by FDA under an Emergency Use Authorization (EUA). This EUA will remain in effect (meaning this test can be used) for the duration of the COVID-19 declaration under Section 564(b)(1) of the Act, 21 U.S.C. section 360bbb-3(b)(1), unless the authorization is terminated or revoked.  Performed at [facility], [address] [place], [place] [address]   Urine Culture     Status: None    Collection Time: 07/20/24  9:05 PM   Specimen: Urine, Random  Result Value Ref Range Status   Specimen Description   Final    URINE, RANDOM Performed at [facility], [address], [place], [place] [address]    Special Requests   Final    NONE Reflexed from [id number] Performed at [facility], [address] [place], [place] [address]    Culture   Final    NO GROWTH Performed at [facility], [address] [place], [place] [address]    Report Status 07/22/2024 FINAL  Final    Today   Subjective    [name] today has no new complaints No fever  Or chills   No Nausea, Vomiting or Diarrhea - No dysuria -No hematuria -Husband at bedside, questions answered          Patient has been seen and examined prior to discharge   Objective   Blood pressure (!) 157/66, pulse 89, temperature 97.8 F (36.6 C), temperature source Oral, resp. rate 16, height 5' 3 (1.6 m), weight 76.5 kg, SpO2 94%.   Intake/Output Summary (Last 24 hours) at 07/22/2024 2006 Last data filed at 07/22/2024 1522 Gross per 24 hour  Intake 1224.21 ml  Output --  Net 1224.21 ml   Exam Gen:- Awake Alert, no acute distress  HEENT:- Learned.AT, No sclera icterus Neck-Supple Neck,No JVD,.  Lungs-  Velcro type rales consistent with pulmonary fibrosis, air movement is fair, no wheezing  CV- S1, S2 normal, regular Abd-  +ve B.Sounds, Abd Soft, No tenderness, no CVA tenderness Extremity/Skin:- No  edema,   good pulses Psych-affect is appropriate, oriented x3 Neuro-no new focal deficits, no tremors    Data Review   CBC w Diff:  Lab Results  Component Value Date   WBC 14.6 (H) 07/22/2024   HGB 14.5 07/22/2024   HGB 16.0 (H) 01/12/2023   HCT 42.9 07/22/2024   PLT 362 07/22/2024  PLT 302 01/12/2023   LYMPHOPCT 9 07/10/2024   MONOPCT 8 07/10/2024   EOSPCT 0 07/10/2024   BASOPCT 1 07/10/2024    CMP:  Lab Results  Component Value Date   NA 142 07/22/2024   NA 140 05/18/2024   K 3.4 (L)  07/22/2024   CL 101 07/22/2024   CO2 28 07/22/2024   BUN 7 (L) 07/22/2024   BUN 13 05/18/2024   CREATININE 0.72 07/22/2024   CREATININE 0.83 09/30/2022   CREATININE 0.76 08/14/2014   PROT 5.9 (L) 07/21/2024   PROT 6.7 05/18/2024   ALBUMIN 2.9 (L) 07/21/2024   ALBUMIN 4.2 05/18/2024   BILITOT 0.5 07/21/2024   BILITOT 0.9 05/18/2024   BILITOT 0.9 09/30/2022   ALKPHOS 76 07/21/2024   AST 32 07/21/2024   AST 29 09/30/2022   ALT 23 07/21/2024   ALT 24 09/30/2022  .  Total Discharge time is about 33 minutes  Rendall Carwin M.D on 07/22/2024 at 8:06 PM  Go to [email or web address] -  for contact info  [facility] - Office  [phone]

## 2024-07-24 ENCOUNTER — Other Ambulatory Visit: Payer: Self-pay | Admitting: Cardiology

## 2024-07-24 DIAGNOSIS — R002 Palpitations: Secondary | ICD-10-CM

## 2024-07-26 ENCOUNTER — Other Ambulatory Visit (HOSPITAL_COMMUNITY): Payer: Self-pay | Admitting: Internal Medicine

## 2024-07-26 DIAGNOSIS — E041 Nontoxic single thyroid nodule: Secondary | ICD-10-CM

## 2024-07-26 LAB — CULTURE, BLOOD (ROUTINE X 2)
Culture: NO GROWTH
Culture: NO GROWTH
Special Requests: ADEQUATE

## 2024-07-26 MED ORDER — FUROSEMIDE 40 MG PO TABS: ORAL_TABLET | ORAL | 0 refills | Status: DC

## 2024-08-02 ENCOUNTER — Inpatient Hospital Stay: Payer: Medicare HMO | Attending: Hematology and Oncology | Admitting: Hematology and Oncology

## 2024-08-02 ENCOUNTER — Other Ambulatory Visit: Payer: Self-pay | Admitting: Hematology and Oncology

## 2024-08-02 VITALS — BP 108/64 | HR 82 | Temp 98.0°F | Resp 16 | Ht 63.0 in | Wt 171.5 lb

## 2024-08-02 DIAGNOSIS — C50412 Malignant neoplasm of upper-outer quadrant of left female breast: Secondary | ICD-10-CM | POA: Insufficient documentation

## 2024-08-02 DIAGNOSIS — Z170 Estrogen receptor positive status [ER+]: Secondary | ICD-10-CM | POA: Insufficient documentation

## 2024-08-02 DIAGNOSIS — Z1732 Human epidermal growth factor receptor 2 negative status: Secondary | ICD-10-CM | POA: Insufficient documentation

## 2024-08-02 DIAGNOSIS — Z79811 Long term (current) use of aromatase inhibitors: Secondary | ICD-10-CM | POA: Diagnosis not present

## 2024-08-02 DIAGNOSIS — Z9889 Other specified postprocedural states: Secondary | ICD-10-CM

## 2024-08-02 NOTE — Assessment & Plan Note (Signed)
 09/18/2022:Screening mammogram detected left breast mass at 2 o'clock position measuring 0.5 cm, axilla negative, biopsy revealed grade 1 IDC with focal extracellular mucin plus grade 1 DCIS ER 100%, PR 99%, Ki-67 5%, HER2 1+, FISH negative  11/04/22: Left lumpectomy: Grade 1 IDC 0.8 cm, margins negative, ER 100%, PR 99%, HER2 negative 1+, Ki-67 5%   Treatment plan: Radiation is not being planned because of history of scleroderma. Current treatment: Antiestrogen therapy with anastrozole  1 mg daily x 5 years started 12/01/2022 Anastrozole  toxicities: Tolerating it well   Breast cancer surveillance: Mammogram   08/31/2023: Benign Breast exam 08/02/2024: Benign CT CAP 07/10/2024: No evidence of metastatic disease.   Return to clinic in 1 year for follow-up

## 2024-08-02 NOTE — Progress Notes (Signed)
 Patient Care Team: Sheryle Carwin, MD as PCP - General (Internal Medicine) Pietro Redell RAMAN, MD as PCP - Cardiology (Cardiology) Brien Belvie BRAVO, MD as Attending Physician (Pulmonary Disease) Vanderbilt Ned, MD as Consulting Physician (General Surgery) Odean Potts, MD as Consulting Physician (Hematology and Oncology) Dewey Rush, MD as Consulting Physician (Radiation Oncology)  DIAGNOSIS:  Encounter Diagnosis  Name Primary?   Malignant neoplasm of upper-outer quadrant of left breast in female, estrogen receptor positive (HCC) Yes    SUMMARY OF ONCOLOGIC HISTORY: Oncology History  Malignant neoplasm of upper-outer quadrant of left breast in female, estrogen receptor positive (HCC)  09/18/2022 Initial Diagnosis   Screening mammogram detected left breast mass at 2 o'clock position measuring 0.5 cm, axilla negative, biopsy revealed grade 1 IDC with focal extracellular mucin plus grade 1 DCIS ER 100%, PR 99%, Ki-67 5%, HER2 1+, FISH negative   09/30/2022 Cancer Staging   Staging form: Breast, AJCC 8th Edition - Clinical: Stage IA (cT1b, cN0, cM0, G1, ER+, PR+, HER2-) - Signed by Odean Potts, MD on 09/30/2022 Histologic grading system: 3 grade system   11/04/2022 Surgery   Left lumpectomy: Grade 1 IDC 0.8 cm, margins negative, ER 100%, PR 99%, HER2 negative 1+, Ki-67 5%   11/2022 -  Anti-estrogen oral therapy   1 mg Anastrozole  x 5 years     CHIEF COMPLIANT:   HISTORY OF PRESENT ILLNESS:   History of Present Illness [name] is a 78 year old female who presents with a recent episode of sepsis.  She was hospitalized due to sepsis, though the cause was not identified, and she had no preceding symptoms. She is on anastrozole  for estrogen receptor-positive breast cancer in the left breast's upper-outer quadrant, with no new issues reported with the medication.     ALLERGIES:  is allergic to codeine.  MEDICATIONS:  Current Outpatient Medications  Medication Sig  Dispense Refill   acetaminophen  (TYLENOL ) 500 MG tablet Take 1,000 mg by mouth every 6 (six) hours as needed for moderate pain.     albuterol  (VENTOLIN  HFA) 108 (90 Base) MCG/ACT inhaler Inhale 2 puffs into the lungs every 6 (six) hours as needed for wheezing. 8 g 1   amLODipine  (NORVASC ) 5 MG tablet Take 5 mg by mouth in the morning.     anastrozole  (ARIMIDEX ) 1 MG tablet Take 1 tablet (1 mg total) by mouth daily. 90 tablet 3   aspirin  EC 81 MG tablet Take 1 tablet (81 mg total) by mouth daily with breakfast. 30 tablet 11   azelastine  (ASTELIN ) 0.1 % nasal spray Place 1 spray into both nostrils 2 (two) times daily as needed for rhinitis. Use in each nostril as directed     furosemide  (LASIX ) 40 MG tablet TAKE 2 TABLETS EVERY DAY 60 tablet 0   hydroxychloroquine  (PLAQUENIL ) 200 MG tablet Take 400 mg by mouth in the morning.     nortriptyline (PAMELOR) 10 MG capsule Take 30 mg by mouth at bedtime.     pantoprazole  (PROTONIX ) 40 MG tablet Take 40 mg by mouth 2 (two) times daily.     predniSONE  (DELTASONE ) 5 MG tablet Take 5 mg by mouth in the morning.     zolpidem  (AMBIEN ) 10 MG tablet Take 10 mg by mouth at bedtime.     No current facility-administered medications for this visit.    PHYSICAL EXAMINATION: ECOG PERFORMANCE STATUS: 1 - Symptomatic but completely ambulatory  Vitals:   08/02/24 1123  BP: 108/64  Pulse: 82  Resp: 16  Temp: 98 F (36.7 C)  SpO2: (!) 9%   Filed Weights   08/02/24 1123  Weight: 171 lb 8 oz (77.8 kg)    Physical Exam   (exam performed in the presence of a chaperone)  LABORATORY DATA:  I have reviewed the data as listed    Latest Ref Rng & Units 07/22/2024    8:59 AM 07/21/2024   12:52 AM 07/20/2024    5:31 PM  CMP  Glucose 70 - 99 mg/dL 879  848  796   BUN 8 - 23 mg/dL 7  13  19    Creatinine 0.44 - 1.00 mg/dL 9.27  9.15  8.80   Sodium 135 - 145 mmol/L 142  135  136   Potassium 3.5 - 5.1 mmol/L 3.4  2.6  3.6   Chloride 98 - 111 mmol/L 101  96  90    CO2 22 - 32 mmol/L 28  25  30    Calcium  8.9 - 10.3 mg/dL 9.1  8.1  9.5   Total Protein 6.5 - 8.1 g/dL  5.9  7.9   Total Bilirubin 0.0 - 1.2 mg/dL  0.5  1.3   Alkaline Phos 38 - 126 U/L  76  101   AST 15 - 41 U/L  32  40   ALT 0 - 44 U/L  23  30     Lab Results  Component Value Date   WBC 14.6 (H) 07/22/2024   HGB 14.5 07/22/2024   HCT 42.9 07/22/2024   MCV 97.3 07/22/2024   PLT 362 07/22/2024   NEUTROABS 14.4 (H) 07/10/2024    ASSESSMENT & PLAN:  Malignant neoplasm of upper-outer quadrant of left breast in female, estrogen receptor positive (HCC) 09/18/2022:Screening mammogram detected left breast mass at 2 o'clock position measuring 0.5 cm, axilla negative, biopsy revealed grade 1 IDC with focal extracellular mucin plus grade 1 DCIS ER 100%, PR 99%, Ki-67 5%, HER2 1+, FISH negative  11/04/22: Left lumpectomy: Grade 1 IDC 0.8 cm, margins negative, ER 100%, PR 99%, HER2 negative 1+, Ki-67 5%   Treatment plan: Radiation is not being planned because of history of scleroderma. Current treatment: Antiestrogen therapy with anastrozole  1 mg daily x 5 years started 12/01/2022 Anastrozole  toxicities: Tolerating it well   Breast cancer surveillance: Mammogram   08/31/2023: Benign Breast exam 08/02/2024: Benign CT CAP 07/10/2024: No evidence of metastatic disease.   Return to clinic in 1 year for follow-up ------------------------------------- Assessment and Plan Assessment & Plan Estrogen receptor positive malignant neoplasm of upper-outer quadrant of left breast Currently on anastrozole . - Perform chest examination to check for recurrence or new issues.  Recurrent urinary tract infections Experiences UTIs annually, recent severe episode led to sepsis. Etiology unclear. Discussed vaginal dryness as a factor, estrogen cream has been used to resolve UTIs.      No orders of the defined types were placed in this encounter.  The patient has a good understanding of the overall plan. she  agrees with it. she will call with any problems that may develop before the next visit here. Total time spent: 30 mins including face to face time and time spent for planning, charting and co-ordination of care   Viinay K Alyas Creary, MD 08/02/24

## 2024-08-03 ENCOUNTER — Ambulatory Visit (HOSPITAL_COMMUNITY)
Admission: RE | Admit: 2024-08-03 | Discharge: 2024-08-03 | Disposition: A | Discharge status: Home / Self Care | Source: Ambulatory Visit | Attending: Internal Medicine | Admitting: Internal Medicine

## 2024-08-03 DIAGNOSIS — E041 Nontoxic single thyroid nodule: Secondary | ICD-10-CM | POA: Diagnosis present

## 2024-08-10 ENCOUNTER — Encounter: Payer: Self-pay | Admitting: Cardiology

## 2024-08-10 ENCOUNTER — Ambulatory Visit: Attending: Cardiology | Admitting: Cardiology

## 2024-08-10 VITALS — BP 130/66 | HR 83 | Ht 63.0 in | Wt 174.4 lb

## 2024-08-10 DIAGNOSIS — I358 Other nonrheumatic aortic valve disorders: Secondary | ICD-10-CM

## 2024-08-10 DIAGNOSIS — I10 Essential (primary) hypertension: Secondary | ICD-10-CM

## 2024-08-10 DIAGNOSIS — I5032 Chronic diastolic (congestive) heart failure: Secondary | ICD-10-CM

## 2024-08-10 DIAGNOSIS — I2510 Atherosclerotic heart disease of native coronary artery without angina pectoris: Secondary | ICD-10-CM | POA: Diagnosis not present

## 2024-08-10 DIAGNOSIS — I4510 Unspecified right bundle-branch block: Secondary | ICD-10-CM

## 2024-08-10 NOTE — Progress Notes (Unsigned)
 Cardiology Office Note    Date:  08/11/2024  ID:  Vickie, [name] [birth year], MRN [id number] PCP:  Sheryle Carwin, MD  Cardiologist:  Redell Shallow, MD  Electrophysiologist:  None   Chief Complaint: Follow up for diastolic HF   History of Present Illness: .    [name] is a 78 y.o. female with visit-pertinent history of diastolic heart failure, hypertension, CREST/scleroderma and ILD, breast cancer, RBBB.   PFTs 4/15 with FEV1 1.54 and DLCO 68% predicted. LHC (03/15/14): RA 7, RV 43/7/9, PA 43/18 (mean 31), PCWP 25, CO 6.9, CI 3.45, no CAD. Placed on lasix . ABIs May 2016 normal. Most recent echocardiogram January 2021 showed normal LV function, moderate left ventricular hypertrophy, grade 1 diastolic dysfunction, mild left atrial enlargement.   Chest CT January 2022 showed pulmonary fibrosis, bronchiectasis consistent with UIP.  MPI in 01/2021 was a normal and low restudy.  Echocardiogram in 06/2022 indicated LVEF of 60 to 65%, no RWMA, G1 DD, RV systolic function and size was normal, normal PASP, no evidence of mitral stenosis, no aortic stenosis present.  Patient was seen in clinic on 01/25/2023 by [name], NP for preoperative cardiac evaluation.  Patient was noted to have new right bundle branch block and was pending surgery.  It was noted that in November 2023 she had a fall leading to a shoulder surgery and also finding of stage I breast cancer requiring left breast lumpectomy on 11/04/2022.  She was being pulled back for shoulder surgery when case was canceled due to a new right bundle branch block.  She reported that her exertional dyspnea was overall stable which she attributed to her scleroderma and ILD however she did note in recent months she had becoming fatigued easily.  She denied any chest pain, pressure, tightness.  It was recommended that patient undergo cardiac CTA.  Coronary CTA on 02/05/2023 indicated minimal nonobstructive CAD with a coronary calcium  score of 419,  this was 85th percentile for age and sex matched control, aortic atherosclerosis was present.  Was recommended that she continue aspirin  81 mg daily and start rosuvastatin  10 mg daily.  Patient was seen in clinic on 05/17/2024 for follow-up, reported that she was doing okay.  She did note increased shortness of breath with exertion, question did this was related to her history of ILD.  She did not feel that there been a significant change in her breathing, more gradual decline.  She denied any chest pain on exertion, noted a occasional discomfort between her shoulder blades and slight discomfort under her left breast when sitting in her chair, improves with stretching or moving.  Echocardiogram on 06/13/2024 indicated LVEF 65 to 70%, no regional wall motion abnormalities, moderate LVH, G1 DD, RV systolic function and size was normal, trivial mitral valve regurgitation with no evidence of stenosis, mild calcification of the aortic valve, regurgitation and stenosis not visualized.  On chart review patient was admitted from 07/10/2024 through 07/13/2024 with acute cystitis without hematuria, sepsis due to gram-negative UTI.  Patient was treated with IV antibiotics and discharged on 8/14.  Patient was seen in clinic on 07/20/2024 by myself.  Patient reported that she had been feeling extremely unwell and continued to have significant fatigue, dizziness, lightheadedness and dyspnea, did not feel she had been improving posthospitalization from sepsis.  It was recommended that patient present to the ED as it was noted that she was hypotensive.  Patient again found to be with acute sepsis, WBC 17.1 lactic acid 2.5.  Patient was treated with IV ceftriaxone  and IV hydration.  Patient was discharged on 07/22/2024.  Today she presents for follow-up.  She reports that she reports she is doing well. She denies chest pain, lower extremity edema, orthoponea or pnd. She denies any palpitations, presyncope or syncope.  Patient  reports that she has been doing extremely well in recent weeks, reports that she has recovered from her episode of sepsis last month.  She reports that her breathing has returned to her baseline.  She is planning to follow-up with GI regarding concerns for fatty liver and is planning to follow-up with her PCP regarding her thyroid  nodules.  Patient denies any cardiac concerns or complaints today.  ROS: .   Today she denies chest pain, shortness of breath, lower extremity edema, fatigue, palpitations, melena, hematuria, hemoptysis, diaphoresis, weakness, presyncope, syncope, orthopnea, and PND.  All other systems are reviewed and otherwise negative. Studies Reviewed: SABRA   EKG:  EKG is not ordered today.  CV Studies: Cardiac studies reviewed are outlined and summarized above. Otherwise please see EMR for full report. Cardiac Studies & Procedures   ______________________________________________________________________________________________   STRESS TESTS  MYOCARDIAL PERFUSION IMAGING 02/12/2021  Interpretation Summary  Nuclear stress EF: 82%.  There was no ST segment deviation noted during stress.  No T wave inversion was noted during stress.  Normal perfusion with no evidence of ischemia or infarction.  The study is normal.  This is a low risk study.  The left ventricular ejection fraction is hyperdynamic (>65%).  Powell Sorrow, MD   ECHOCARDIOGRAM  ECHOCARDIOGRAM COMPLETE 06/13/2024  Narrative ECHOCARDIOGRAM REPORT    Patient Name:   ANAIJA [name] Date of Exam: 06/13/2024 Medical Rec #:  [id number]         Height:       63.0 in Accession #:    [id number]        Weight:       182.6 lb Date of Birth:  09-Jan-1946         BSA:          1.860 m Patient Age:    78 years          BP:           119/64 mmHg Patient Gender: F                 HR:           85 bpm. Exam Location:  [facility]  Procedure: 2D Echo, Cardiac Doppler and Color Doppler (Both Spectral and  Color Flow Doppler were utilized during procedure).  Indications:     DOE (dyspnea on exertion) [242095] Aortic valve sclerosis [320910]  History:         Patient has prior history of Echocardiogram examinations, most recent 07/14/2022. Arrythmias:RBBB; Risk Factors:Hypertension. Hx of breast cancer.  Sonographer:     Aida Pizza RCS Referring Phys:  [id number] XJUOBW D Rimsha Trembley Diagnosing Phys: Dorn Ross MD  IMPRESSIONS   1. Left ventricular ejection fraction, by estimation, is 65 to 70%. The left ventricle has normal function. The left ventricle has no regional wall motion abnormalities. There is moderate left ventricular hypertrophy. Left ventricular diastolic parameters are consistent with Grade I diastolic dysfunction (impaired relaxation). 2. Right ventricular systolic function is normal. The right ventricular size is normal. 3. The mitral valve is normal in structure. Trivial mitral valve regurgitation. No evidence of mitral stenosis. 4. The aortic valve is tricuspid. There is mild calcification of the aortic valve.  There is mild thickening of the aortic valve. Aortic valve regurgitation is not visualized. No aortic stenosis is present. 5. The inferior vena cava is normal in size with greater than 50% respiratory variability, suggesting right atrial pressure of 3 mmHg.  FINDINGS Left Ventricle: Left ventricular ejection fraction, by estimation, is 65 to 70%. The left ventricle has normal function. The left ventricle has no regional wall motion abnormalities. The left ventricular internal cavity size was normal in size. There is moderate left ventricular hypertrophy. Left ventricular diastolic parameters are consistent with Grade I diastolic dysfunction (impaired relaxation). Normal left ventricular filling pressure.  Right Ventricle: The right ventricular size is normal. Right vetricular wall thickness was not well visualized. Right ventricular systolic function is normal.  Left  Atrium: Left atrial size was normal in size.  Right Atrium: Right atrial size was normal in size.  Pericardium: There is no evidence of pericardial effusion.  Mitral Valve: The mitral valve is normal in structure. Trivial mitral valve regurgitation. No evidence of mitral valve stenosis.  Tricuspid Valve: The tricuspid valve is normal in structure. Tricuspid valve regurgitation is not demonstrated. No evidence of tricuspid stenosis.  Aortic Valve: The aortic valve is tricuspid. There is mild calcification of the aortic valve. There is mild thickening of the aortic valve. There is mild aortic valve annular calcification. Aortic valve regurgitation is not visualized. No aortic stenosis is present. Aortic valve mean gradient measures 8.0 mmHg. Aortic valve peak gradient measures 14.7 mmHg. Aortic valve area, by VTI measures 2.04 cm.  Pulmonic Valve: The pulmonic valve was not well visualized. Pulmonic valve regurgitation is not visualized. No evidence of pulmonic stenosis.  Aorta: The aortic root is normal in size and structure.  Venous: The inferior vena cava is normal in size with greater than 50% respiratory variability, suggesting right atrial pressure of 3 mmHg.  IAS/Shunts: No atrial level shunt detected by color flow Doppler.   LEFT VENTRICLE PLAX 2D LVIDd:         4.10 cm   Diastology LVIDs:         2.30 cm   LV e' medial:    6.96 cm/s LV PW:         1.30 cm   LV E/e' medial:  10.8 LV IVS:        1.20 cm   LV e' lateral:   9.57 cm/s LVOT diam:     1.80 cm   LV E/e' lateral: 7.9 LV SV:         82 LV SV Index:   44 LVOT Area:     2.54 cm   RIGHT VENTRICLE RV S prime:     11.90 cm/s TAPSE (M-mode): 2.2 cm  LEFT ATRIUM             Index        RIGHT ATRIUM          Index LA diam:        3.90 cm 2.10 cm/m   RA Area:     9.62 cm LA Vol (A2C):   39.6 ml 21.29 ml/m  RA Volume:   18.10 ml 9.73 ml/m LA Vol (A4C):   51.7 ml 27.79 ml/m LA Biplane Vol: 47.7 ml 25.64  ml/m AORTIC VALVE AV Area (Vmax):    2.15 cm AV Area (Vmean):   2.15 cm AV Area (VTI):     2.04 cm AV Vmax:           192.00 cm/s AV Vmean:  129.000 cm/s AV VTI:            0.402 m AV Peak Grad:      14.7 mmHg AV Mean Grad:      8.0 mmHg LVOT Vmax:         162.00 cm/s LVOT Vmean:        109.000 cm/s LVOT VTI:          0.322 m LVOT/AV VTI ratio: 0.80  AORTA Ao Root diam: 3.20 cm  MITRAL VALVE MV Area (PHT): 3.42 cm     SHUNTS MV Decel Time: 222 msec     Systemic VTI:  0.32 m MV E velocity: 75.50 cm/s   Systemic Diam: 1.80 cm MV A velocity: 114.00 cm/s MV E/A ratio:  0.66  Dorn Ross MD Electronically signed by Dorn Ross MD Signature Date/Time: 06/13/2024/5:21:12 PM    Final (Updated)      CT SCANS  CT CORONARY MORPH W/CTA COR W/SCORE 02/05/2023  Addendum 02/11/2023  9:55 AM ADDENDUM REPORT: 02/11/2023 09:52  EXAM: OVER-READ INTERPRETATION  CT CHEST  The following report is an over-read performed by radiologist Dr. Mabel Converse of [facility] on 02/11/2023. This over-read does not include interpretation of cardiac or coronary anatomy or pathology. The coronary CTA interpretation by the cardiologist is attached.  COMPARISON:  08/31/2022  FINDINGS: Heart size is upper limits of normal. No pericardial effusion. The imaged portion of the thoracic aorta is nonaneurysmal. Central pulmonary vasculature is nondilated. No lymphadenopathy within the imaged chest.  Chronic interstitial lung disease, most severely affecting the right middle lobe and lingula, without evidence of significant progression when compared to the previous CT. No pleural effusion or pneumothorax.  Hepatic steatosis. No acute bony abnormality. Postsurgical changes within the left breast with 3.1 cm fluid collection, which may represent a postoperative seroma or hematoma. Correlate with patient history.  IMPRESSION: 1. Chronic interstitial lung disease,  most severely affecting the right middle lobe and lingula, without evidence of significant progression when compared to the previous CT. 2. Postsurgical changes within the left breast with 3.1 cm fluid collection, which may represent a postoperative seroma or hematoma. Correlate with patient history. 3. Hepatic steatosis.   Electronically Signed By: Mabel Converse D.O. On: 02/11/2023 09:52  Narrative HISTORY: Chest pain, nonspecific  EXAM: Cardiac/Coronary CT  TECHNIQUE: The patient was scanned on a [employer].  PROTOCOL: A 120 kV prospective scan was triggered in the descending thoracic aorta at 111 HU's. Axial non-contrast 3 mm slices were carried out through the heart. The data set was analyzed on a dedicated work station and scored using the Agatston method. Gantry rotation speed was 250 msecs and collimation was 0.6 mm. Heart rate was optimized medically and sl NTG was given. The 3D data set was reconstructed in 5% intervals of the 35-75 % of the R-R cycle. Systolic and diastolic phases were analyzed on a dedicated work station using MPR, MIP and VRT modes. The patient received 100mL OMNIPAQUE  IOHEXOL  350 MG/ML SOLN of contrast.  FINDINGS: Coronary calcium  score: The patient's coronary artery calcium  score is 419, which places the patient in the 85th percentile.  Coronary arteries: Normal coronary origins.  Right dominance.  Right Coronary Artery: Normal caliber vessel, gives rise to PDA. Scattered calcified plaque in portions of proximal, mid and distal vessel. Maximum stenosis 1-24%.  Left Main Coronary Artery: Normal caliber vessel. No significant plaque or stenosis.  Left Anterior Descending Coronary Artery: Normal caliber vessel. Proximal vessel with focal calcified  plaque with 1-24% stenosis. Gives rise to two small diagonal branches.  Left Circumflex Artery: Normal caliber vessel. Proximal vessel with focal calcified plaque with 1-24%  stenosis. Gives rise to four small OM branches.  Aorta: Normal size, 32 mm at the mid ascending aorta (level of the PA bifurcation) measured double oblique. Aortic atherosclerosis. No dissection seen in visualized portions of the aorta.  Aortic Valve: No calcifications. Trileaflet.  Other findings:  Normal pulmonary vein drainage into the left atrium.  Normal left atrial appendage without a thrombus.  Normal size of the pulmonary artery.  Normal appearance of the pericardium.  Elevated right hemidiaphragm  IMPRESSION: 1.  Minimal nonobstructive CAD, CADRADS = 1.  2. Coronary calcium  score of 419. This was 85th percentile for age and sex matched control.  3. Normal coronary origin with right dominance.  4.  Aortic atherosclerosis.  5.  Elevated right hemidiaphragm  INTERPRETATION:  CAD-RADS 1: Minimal non-obstructive CAD (1-24%). Consider non-atherosclerotic causes of chest pain. Consider preventive therapy and risk factor modification.  Electronically Signed: By: Shelda Bruckner M.D. On: 02/08/2023 17:26     ______________________________________________________________________________________________       Current Reported Medications:.    Current Meds  Medication Sig   acetaminophen  (TYLENOL ) 500 MG tablet Take 1,000 mg by mouth every 6 (six) hours as needed for moderate pain.   albuterol  (VENTOLIN  HFA) 108 (90 Base) MCG/ACT inhaler Inhale 2 puffs into the lungs every 6 (six) hours as needed for wheezing.   amLODipine  (NORVASC ) 5 MG tablet Take 5 mg by mouth in the morning.   anastrozole  (ARIMIDEX ) 1 MG tablet Take 1 tablet (1 mg total) by mouth daily.   aspirin  EC 81 MG tablet Take 1 tablet (81 mg total) by mouth daily with breakfast.   azelastine  (ASTELIN ) 0.1 % nasal spray Place 1 spray into both nostrils 2 (two) times daily as needed for rhinitis. Use in each nostril as directed   furosemide  (LASIX ) 40 MG tablet TAKE 2 TABLETS EVERY DAY    hydroxychloroquine  (PLAQUENIL ) 200 MG tablet Take 400 mg by mouth in the morning.   nortriptyline (PAMELOR) 10 MG capsule Take 30 mg by mouth at bedtime.   pantoprazole  (PROTONIX ) 40 MG tablet Take 40 mg by mouth 2 (two) times daily.   predniSONE  (DELTASONE ) 5 MG tablet Take 5 mg by mouth in the morning.   zolpidem  (AMBIEN ) 10 MG tablet Take 10 mg by mouth at bedtime.    Physical Exam:    VS:  BP 130/66   Pulse 83   Ht 5' 3 (1.6 m)   Wt 174 lb 6.4 oz (79.1 kg)   SpO2 96%   BMI 30.89 kg/m    Wt Readings from Last 3 Encounters:  08/10/24 174 lb 6.4 oz (79.1 kg)  08/02/24 171 lb 8 oz (77.8 kg)  07/21/24 168 lb 10.4 oz (76.5 kg)    GEN: Well nourished, well developed in no acute distress NECK: No JVD; No carotid bruits CARDIAC: RRR, no murmurs, rubs, gallops RESPIRATORY:  Clear to auscultation without rales, wheezing or rhonchi  ABDOMEN: Soft, non-tender, non-distended EXTREMITIES:  No edema; No acute deformity     Asessement and Plan:.    CAD/RBBB: Coronary CTA on 02/05/2023 indicated minimal nonobstructive CAD with a coronary calcium  score of 419, this was 85th percentile for age and sex matched control, aortic atherosclerosis was present.  Today she denies chest pain, reports that her breathing is at her baseline, denies any significant changes. Heart healthy diet and  regular cardiovascular exercise encouraged.  Reviewed ED precautions.  Continue aspirin  81 mg daily, amlodipine  5 mg daily, Lasix  80 mg daily.   Chronic diastolic HF/aortic valve sclerosis: Echocardiogram in 06/2022 indicated LVEF of 60 to 65%, no RWMA, G1 DD, RV systolic function and size was normal, normal PASP, no evidence of mitral stenosis, no aortic stenosis present. Echocardiogram on 06/13/2024 indicated LVEF 65 to 70%, no regional wall motion abnormalities, moderate LVH, G1 DD, RV systolic function and size was normal, trivial mitral valve regurgitation with no evidence of stenosis, mild calcification of the aortic  valve, regurgitation and stenosis not visualized.  Patient reports that her breathing is at baseline, denies any significant changes.  She denies any lower extremity edema, orthopnea or PND.  She appears euvolemic and well compensated on exam.  Continue Lasix .   Hypertension: Blood pressure today 130/66.  Continue current antihypertensive regimen.  Hyperlipidemia: Last lipid profile on 619/25 indicated total cholesterol 142, HDL 61, triglycerides 127 and LDL 59.  Patient to follow-up with GI given concerns for possible liver cirrhosis, she is not on statin therapy at this time.   Disposition: F/u with Dr. Pietro in four months or sooner if needed.   Signed, Jaeven Wanzer D Alasia Enge, NP

## 2024-08-10 NOTE — Patient Instructions (Signed)
 Medication Instructions:  Your physician recommends that you continue on your current medications as directed. Please refer to the Current Medication list given to you today.  *If you need a refill on your cardiac medications before your next appointment, please call your pharmacy*  Lab Work: None ordered If you have labs (blood work) drawn today and your tests are completely normal, you will receive your results only by: MyChart Message (if you have MyChart) OR A paper copy in the mail If you have any lab test that is abnormal or we need to change your treatment, we will call you to review the results.  Follow-Up: At [facility], you and your health needs are our priority.  As part of our continuing mission to provide you with exceptional heart care, our providers are all part of one team.  This team includes your primary Cardiologist (physician) and Advanced Practice Providers or APPs (Physician Assistants and Nurse Practitioners) who all work together to provide you with the care you need, when you need it.  Your next appointment:   4 month(s)  Provider:   Redell Shallow, MD

## 2024-08-11 ENCOUNTER — Encounter: Payer: Self-pay | Admitting: Cardiology

## 2024-08-17 ENCOUNTER — Ambulatory Visit (INDEPENDENT_AMBULATORY_CARE_PROVIDER_SITE_OTHER): Admitting: Gastroenterology

## 2024-08-17 ENCOUNTER — Encounter (INDEPENDENT_AMBULATORY_CARE_PROVIDER_SITE_OTHER): Payer: Self-pay | Admitting: Gastroenterology

## 2024-08-17 VITALS — BP 130/75 | HR 90 | Temp 97.5°F | Ht 63.0 in | Wt 176.9 lb

## 2024-08-17 DIAGNOSIS — K7460 Unspecified cirrhosis of liver: Secondary | ICD-10-CM

## 2024-08-17 DIAGNOSIS — K7581 Nonalcoholic steatohepatitis (NASH): Secondary | ICD-10-CM

## 2024-08-17 DIAGNOSIS — R933 Abnormal findings on diagnostic imaging of other parts of digestive tract: Secondary | ICD-10-CM | POA: Diagnosis not present

## 2024-08-17 DIAGNOSIS — Z1159 Encounter for screening for other viral diseases: Secondary | ICD-10-CM

## 2024-08-17 NOTE — Patient Instructions (Addendum)
-   Can implement Mediterranean diet - Perform blood workup - Reduce salt intake to <2 g per day - Can take Tylenol  max of 2 g per day (650 mg q8h) for pain - Avoid NSAIDs for pain - Avoid eating raw oysters/shellfish - Protein shake (Ensure or Boost) every night before going to sleep

## 2024-08-17 NOTE — Progress Notes (Unsigned)
 Toribio Fortune, M.D. Gastroenterology & Hepatology [facility] [address] [place], [place] [address] Primary Care Physician: Sheryle Carwin, MD [address] [place] [address]  Referring MD: PCP  Chief Complaint:  possible liver cirrhosis  History of Present Illness: LEE-ANN GAL is a 78 y.o. female with past medical history of left breast cancer, GERD, fatty liver, hypertension, interstitial lung disease, Raynaud's disease, scleroderma, CHF, rheumatoid arthritis, who presents for evaluation of NASH and possible liver cirrhosis.  Patient was admitted to the hospital at [facility] on 07/10/2024 after presenting sepsis due to E. coli and Klebsiella UTI, for which she was discharged on cefadroxil .  Readmitted on 07/20/2024 due to recurrent weakness.  The patient had a CT of the chest, abdomen and pelvis with IV contrast on 07/10/2024 that showed diffuse fatty infiltration of the liver with enlargement of the caudate lobe of, concerning for possible cirrhosis.  Most recent labs from 07/21/2024 showed potassium of 2.6, sodium 135, chloride 96, creatinine 0.84, BUN 13, albumin 2.9, AST 32, ALT 23, total bilirubin 0.5, alkaline phosphatase 76, CBC with platelets 361, WBC 16.6 and hemoglobin 13.8.  States that she is very careful with swallowing as she had a history of scleroderma. She tries to eat food thoroughly toa void choking. She takes pantoprazole  40 mg twice a day, but does not have any heartburn regularly - may take Tums to improve any discomfort.  The patient denies having any nausea, vomiting, fever, chills, hematochezia, melena, hematemesis, abdominal distention, abdominal pain, diarrhea, jaundice, pruritus. Lost 14 lb since the time she got hospitalized. Used  a home-made shake with multiple healthy foods to have a BM, now takes a stool softener daily.  Cirrhosis related questions: Hematemesis/coffee ground emesis:  No Abdominal pain: No Abdominal distention/worsening ascitesNo Fever/chills: No Episodes of confusion/disorientation: No Taking diuretics?: Lasix  80 mg qday History of variceal bleeding: No Prior history of banding?: No Prior episodes of SBP: No Last time liver imaging was performed: 09/21/2024 US , no liver masses, changes concerning for cirrhosis Last AFP: none available MELD 3.0 score: 07/20/24  - 10 Currently consuming alcohol: No Hepatitis A and B vaccination status: possibly had hep B vaccination  Last ZHI:[place] in 2019 with Dr. Donnald, no report available. Patietn reports that this was normal. Last Colonoscopy:Possibly in 2019 with Dr. Donnald, no report available. Patietn reports that this was normal.  FHx: neg for any gastrointestinal/liver disease, no malignancies Social: quit smoking 30  years ago, neg  alcohol or illicit drug use Surgical: no abdominal surgeries  Past Medical History: Past Medical History:  Diagnosis Date   Cancer (HCC)    Left breast cancer   Diastolic congestive heart failure (HCC)    02/05/15  states no fluid in extremities, breathing same as has been   GERD (gastroesophageal reflux disease)    Hepatic steatosis    History of blood transfusion    Hx of cholecystectomy    Hypertension    Interstitial lung disease (HCC)    Osteoarthritis    Pulmonary fibrosis (HCC)    Raynaud disease    Rheumatoid arthritis(714.0)    Right BBB/left ant fasc block    Scleroderma (HCC)    Seasonal allergies    Shortness of breath dyspnea    states normal for her   Systemic sclerosis [facility])     Past Surgical History: Past Surgical History:  Procedure Laterality Date   ABDOMINAL HYSTERECTOMY     BLADDER SUSPENSION  BREAST BIOPSY  11/03/2022   MM LT RADIOACTIVE SEED LOC MAMMO GUIDE 11/03/2022 [facility] MAMMOGRAPHY   BREAST LUMPECTOMY WITH RADIOACTIVE SEED LOCALIZATION Left 11/04/2022   Procedure: LEFT BREAST LUMPECTOMY WITH RADIOACTIVE SEED LOCALIZATION;   Surgeon: Vanderbilt Ned, MD;  Location: [facility] OR;  Service: General;  Laterality: Left;   CARPAL TUNNEL RELEASE Right 2007   EYE SURGERY Bilateral 2023   cataract w/ IOL   LAPAROSCOPIC CHOLECYSTECTOMY     LEFT HEART CATHETERIZATION WITH CORONARY/GRAFT ANGIOGRAM  03/15/2014   Procedure: LEFT HEART CATHETERIZATION WITH EL BILE;  Surgeon: Lonni JONETTA Cash, MD;  Location: [facility] CATH LAB;  Service: Cardiovascular;;   REVERSE SHOULDER ARTHROPLASTY Right 05/06/2023   Procedure: REVERSE SHOULDER ARTHROPLASTY;  Surgeon: Melita Drivers, MD;  Location: [facility] ORS;  Service: Orthopedics;  Laterality: Right;    TONSILLECTOMY     TOTAL KNEE ARTHROPLASTY Left 02/11/2015   Procedure: LEFT TOTAL KNEE ARTHROPLASTY, RIGHT KNEE CORTISONE INJECTION;  Surgeon: Dempsey Moan, MD;  Location: [facility] ORS;  Service: Orthopedics;  Laterality: Left;   TOTAL KNEE ARTHROPLASTY Right 10/05/2016   Procedure: RIGHT TOTAL KNEE ARTHROPLASTY;  Surgeon: Dempsey Moan, MD;  Location: [facility] ORS;  Service: Orthopedics;  Laterality: Right;   VESICOVAGINAL FISTULA CLOSURE W/ TAH  11/30/1985    Family History: Family History  Problem Relation Age of Onset   Stroke Mother    Heart disease Mother        CABG at age 20   Heart attack Father        Died of MI at age 13   Emphysema Father    Dementia Brother    Osteoarthritis Other    Celiac disease Other     Social History: Social History   Tobacco Use  Smoking Status Former   Current packs/day: 0.00   Average packs/day: 1 pack/day for 20.0 years (20.0 ttl pk-yrs)   Types: Cigarettes   Start date: 11/30/1968   Quit date: 11/30/1988   Years since quitting: 35.7  Smokeless Tobacco Never   Social History   Substance and Sexual Activity  Alcohol Use No   Social History   Substance and Sexual Activity  Drug Use No    Allergies: Allergies  Allergen Reactions   Codeine Other (See Comments)    Neurological problems - hallucinations, spacey     Medications: Current Outpatient Medications  Medication Sig Dispense Refill   acetaminophen  (TYLENOL ) 500 MG tablet Take 1,000 mg by mouth every 6 (six) hours as needed for moderate pain.     albuterol  (VENTOLIN  HFA) 108 (90 Base) MCG/ACT inhaler Inhale 2 puffs into the lungs every 6 (six) hours as needed for wheezing. 8 g 1   amLODipine  (NORVASC ) 5 MG tablet Take 5 mg by mouth in the morning.     anastrozole  (ARIMIDEX ) 1 MG tablet Take 1 tablet (1 mg total) by mouth daily. 90 tablet 3   aspirin  EC 81 MG tablet Take 1 tablet (81 mg total) by mouth daily with breakfast. 30 tablet 11   azelastine  (ASTELIN ) 0.1 % nasal spray Place 1 spray into both nostrils 2 (two) times daily as needed for rhinitis. Use in each nostril as directed     furosemide  (LASIX ) 40 MG tablet TAKE 2 TABLETS EVERY DAY 60 tablet 0   hydroxychloroquine  (PLAQUENIL ) 200 MG tablet Take 400 mg by mouth in the morning.     nortriptyline (PAMELOR) 10 MG capsule Take 30 mg by mouth at bedtime.     pantoprazole  (PROTONIX ) 40 MG tablet Take  40 mg by mouth 2 (two) times daily.     predniSONE  (DELTASONE ) 5 MG tablet Take 5 mg by mouth in the morning.     zolpidem  (AMBIEN ) 10 MG tablet Take 10 mg by mouth at bedtime.     No current facility-administered medications for this visit.    Review of Systems: GENERAL: negative for malaise, night sweats HEENT: No changes in hearing or vision, no nose bleeds or other nasal problems. NECK: Negative for lumps, goiter, pain and significant neck swelling RESPIRATORY: Negative for cough, wheezing CARDIOVASCULAR: Negative for chest pain, leg swelling, palpitations, orthopnea GI: SEE HPI MUSCULOSKELETAL: Negative for joint pain or swelling, back pain, and muscle pain. SKIN: Negative for lesions, rash PSYCH: Negative for sleep disturbance, mood disorder and recent psychosocial stressors. HEMATOLOGY Negative for prolonged bleeding, bruising easily, and swollen nodes. ENDOCRINE: Negative for  cold or heat intolerance, polyuria, polydipsia and goiter. NEURO: negative for tremor, gait imbalance, syncope and seizures. The remainder of the review of systems is noncontributory.   Physical Exam: BP 130/75 (BP Location: Left Arm, Patient Position: Sitting, Cuff Size: Large)   Pulse 90   Temp (!) 97.5 F (36.4 C) (Temporal)   Ht 5' 3 (1.6 m)   Wt 176 lb 14.4 oz (80.2 kg)   BMI 31.34 kg/m  GENERAL: The patient is AO x3, in no acute distress. HEENT: Head is normocephalic and atraumatic. EOMI are intact. Mouth is well hydrated and without lesions. NECK: Supple. No masses LUNGS: Clear to auscultation. No presence of rhonchi/wheezing/rales. Adequate chest expansion HEART: RRR, normal s1 and s2. ABDOMEN: Soft, nontender, no guarding, no peritoneal signs, and nondistended. BS +. No masses. RECTAL EXAM: no external lesions, normal tone, no masses, brown stool without blood.*** Chaperone: EXTREMITIES: Without any cyanosis, clubbing, rash, lesions or edema. NEUROLOGIC: AOx3, no focal motor deficit. SKIN: no jaundice, no rashes   Imaging/Labs: as above  I personally reviewed and interpreted the available labs, imaging and endoscopic files.  Impression and Plan: KELLEE SITTNER is a 78 y.o. female with ***   All questions were answered.      Toribio Fortune, MD Gastroenterology and Hepatology [facility] Gastroenterology

## 2024-08-22 ENCOUNTER — Ambulatory Visit (INDEPENDENT_AMBULATORY_CARE_PROVIDER_SITE_OTHER): Payer: Self-pay | Admitting: Gastroenterology

## 2024-08-23 ENCOUNTER — Telehealth: Payer: Self-pay | Admitting: Cardiology

## 2024-08-23 DIAGNOSIS — R002 Palpitations: Secondary | ICD-10-CM

## 2024-08-23 LAB — HEPATITIS PANEL, ACUTE
Hep A IgM: NONREACTIVE
Hep B C IgM: NONREACTIVE
Hepatitis B Surface Ag: NONREACTIVE
Hepatitis C Ab: NONREACTIVE

## 2024-08-23 LAB — COMPREHENSIVE METABOLIC PANEL WITH GFR
AG Ratio: 1.6 (calc) (ref 1.0–2.5)
ALT: 23 U/L (ref 6–29)
AST: 29 U/L (ref 10–35)
Albumin: 4.1 g/dL (ref 3.6–5.1)
Alkaline phosphatase (APISO): 81 U/L (ref 37–153)
BUN: 13 mg/dL (ref 7–25)
CO2: 34 mmol/L — ABNORMAL HIGH (ref 20–32)
Calcium: 9.5 mg/dL (ref 8.6–10.4)
Chloride: 97 mmol/L — ABNORMAL LOW (ref 98–110)
Creat: 0.69 mg/dL (ref 0.60–1.00)
Globulin: 2.5 g/dL (ref 1.9–3.7)
Glucose, Bld: 155 mg/dL — ABNORMAL HIGH (ref 65–139)
Potassium: 3.3 mmol/L — ABNORMAL LOW (ref 3.5–5.3)
Sodium: 141 mmol/L (ref 135–146)
Total Bilirubin: 0.7 mg/dL (ref 0.2–1.2)
Total Protein: 6.6 g/dL (ref 6.1–8.1)
eGFR: 90 mL/min/1.73m2 (ref >=60)

## 2024-08-23 LAB — AFP TUMOR MARKER: AFP-Tumor Marker: 4.7 ng/mL

## 2024-08-23 LAB — HEPATITIS A ANTIBODY, TOTAL: Hepatitis A AB,Total: REACTIVE — AB

## 2024-08-23 LAB — ANTI-NUCLEAR AB-TITER (ANA TITER)
ANA TITER: 1:160 {titer} — ABNORMAL HIGH
ANA Titer 1: 1:160 {titer} — ABNORMAL HIGH

## 2024-08-23 LAB — PROTIME-INR
INR: 1.1
Prothrombin Time: 11.1 s (ref 9.0–11.5)

## 2024-08-23 LAB — ANA: Anti Nuclear Antibody (ANA): POSITIVE — AB

## 2024-08-23 LAB — HEPATITIS B SURFACE ANTIBODY,QUALITATIVE: Hep B S Ab: REACTIVE — AB

## 2024-08-23 LAB — ANTI-SMOOTH MUSCLE ANTIBODY, IGG: Actin (Smooth Muscle) Antibody (IGG): <20 U (ref <20)

## 2024-08-23 LAB — IGG: IgG (Immunoglobin G), Serum: 1025 mg/dL (ref 600–1540)

## 2024-08-23 MED ORDER — FUROSEMIDE 40 MG PO TABS: ORAL_TABLET | ORAL | 3 refills | Status: AC

## 2024-08-23 NOTE — Telephone Encounter (Signed)
 Pt's medication was sent to pt's pharmacy as requested. Confirmation received.

## 2024-08-23 NOTE — Telephone Encounter (Signed)
*  STAT* If patient is at the pharmacy, call can be transferred to refill team.   1. Which medications need to be refilled? (please list name of each medication and dose if known)   furosemide  (LASIX ) 40 MG tablet     2. Would you like to learn more about the convenience, safety, & potential cost savings by using the [facility]? NO   3. Are you open to using the [facility] NO   4. Which pharmacy/location (including street and city if local pharmacy) is medication to be sent to? [facility] - [address] [place], [place] [address]   5. Do they need a 30 day or 90 day supply? 90

## 2024-08-23 NOTE — Telephone Encounter (Signed)
Lab report faxed to PCP ? ?

## 2024-08-29 ENCOUNTER — Other Ambulatory Visit (HOSPITAL_COMMUNITY): Payer: Self-pay | Admitting: Internal Medicine

## 2024-08-29 DIAGNOSIS — E041 Nontoxic single thyroid nodule: Secondary | ICD-10-CM

## 2024-08-31 ENCOUNTER — Ambulatory Visit
Admission: RE | Admit: 2024-08-31 | Discharge: 2024-08-31 | Disposition: A | Discharge status: Home / Self Care | Source: Ambulatory Visit | Attending: Hematology and Oncology | Admitting: Hematology and Oncology

## 2024-08-31 ENCOUNTER — Encounter

## 2024-08-31 ENCOUNTER — Telehealth (INDEPENDENT_AMBULATORY_CARE_PROVIDER_SITE_OTHER): Payer: Self-pay | Admitting: Gastroenterology

## 2024-08-31 DIAGNOSIS — Z9889 Other specified postprocedural states: Secondary | ICD-10-CM

## 2024-08-31 NOTE — Telephone Encounter (Signed)
 I received the records of the previous workup performed by Dr. Donnald.  Colonoscopy on 12/01/2018 showed excellent bowel preparation, colonoscopy was within normal limits.  Given age, it was not recommended to repeat a colonoscopy.  Esophagogastroduodenospy on 12/01/2018 was within normal limits as well.

## 2024-09-13 ENCOUNTER — Encounter (INDEPENDENT_AMBULATORY_CARE_PROVIDER_SITE_OTHER): Payer: Self-pay | Admitting: Gastroenterology

## 2024-09-13 ENCOUNTER — Encounter (HOSPITAL_COMMUNITY): Payer: Self-pay

## 2024-09-13 ENCOUNTER — Ambulatory Visit (HOSPITAL_COMMUNITY)
Admission: RE | Admit: 2024-09-13 | Discharge: 2024-09-13 | Disposition: A | Discharge status: Home / Self Care | Source: Ambulatory Visit | Attending: Internal Medicine | Admitting: Internal Medicine

## 2024-09-13 DIAGNOSIS — E041 Nontoxic single thyroid nodule: Secondary | ICD-10-CM | POA: Diagnosis present

## 2024-09-13 MED ORDER — LIDOCAINE HCL (PF) 2 % IJ SOLN
10.0000 mL | Freq: Once | INTRAMUSCULAR | Status: AC
  Administered 2024-09-13: 10 mL

## 2024-09-13 MED ORDER — LIDOCAINE HCL (PF) 2 % IJ SOLN
INTRAMUSCULAR | Status: AC
  Filled 2024-09-13: qty 10

## 2024-09-13 NOTE — Progress Notes (Signed)
 PT tolerated thyroid biopsy procedure well today. Labs and afirma obtained and sent for pathology by Colette from ultrasound. PT ambulatory at discharge with no acute distress noted and verbalized understanding of discharge instructions.

## 2024-09-14 ENCOUNTER — Ambulatory Visit: Admitting: Internal Medicine

## 2024-09-18 LAB — CYTOLOGY - NON PAP

## 2024-09-20 ENCOUNTER — Other Ambulatory Visit (HOSPITAL_COMMUNITY): Payer: Self-pay | Admitting: Internal Medicine

## 2024-09-20 DIAGNOSIS — E041 Nontoxic single thyroid nodule: Secondary | ICD-10-CM

## 2024-10-11 ENCOUNTER — Ambulatory Visit (HOSPITAL_COMMUNITY)
Admission: RE | Admit: 2024-10-11 | Discharge: 2024-10-11 | Disposition: A | Discharge status: Home / Self Care | Source: Ambulatory Visit | Attending: Internal Medicine | Admitting: Internal Medicine

## 2024-10-11 DIAGNOSIS — E041 Nontoxic single thyroid nodule: Secondary | ICD-10-CM | POA: Insufficient documentation

## 2024-10-11 MED ORDER — LIDOCAINE HCL (PF) 1 % IJ SOLN
4.0000 mL | Freq: Once | INTRAMUSCULAR | Status: AC
  Administered 2024-10-11: 4 mL via INTRADERMAL

## 2024-10-11 NOTE — Procedures (Signed)
 PROCEDURE SUMMARY:  Using direct ultrasound guidance, 5 passes were made using 25 g needles into the nodule #3 within the right, inferior lobe of the thyroid .   Ultrasound was used to confirm needle placements on all occasions.   EBL = trace  Specimens were sent to Pathology for analysis.  See procedure note under Imaging tab in Epic for full procedure details.  Carlin LABOR Willean Schurman PA-C 10/11/2024 2:18 PM

## 2024-10-13 LAB — CYTOLOGY - NON PAP

## 2024-11-08 ENCOUNTER — Ambulatory Visit: Admitting: Primary Care

## 2024-11-08 ENCOUNTER — Encounter: Payer: Self-pay | Admitting: Primary Care

## 2024-11-08 ENCOUNTER — Ambulatory Visit

## 2024-11-08 VITALS — BP 126/64 | HR 93 | Temp 97.6°F | Ht 63.0 in | Wt 175.0 lb

## 2024-11-08 DIAGNOSIS — B351 Tinea unguium: Secondary | ICD-10-CM

## 2024-11-08 DIAGNOSIS — I7300 Raynaud's syndrome without gangrene: Secondary | ICD-10-CM

## 2024-11-08 DIAGNOSIS — Z87891 Personal history of nicotine dependence: Secondary | ICD-10-CM

## 2024-11-08 DIAGNOSIS — M349 Systemic sclerosis, unspecified: Secondary | ICD-10-CM | POA: Diagnosis not present

## 2024-11-08 DIAGNOSIS — J3089 Other allergic rhinitis: Secondary | ICD-10-CM | POA: Diagnosis not present

## 2024-11-08 DIAGNOSIS — J8489 Other specified interstitial pulmonary diseases: Secondary | ICD-10-CM

## 2024-11-08 DIAGNOSIS — J8410 Pulmonary fibrosis, unspecified: Secondary | ICD-10-CM

## 2024-11-08 DIAGNOSIS — J849 Interstitial pulmonary disease, unspecified: Secondary | ICD-10-CM | POA: Diagnosis not present

## 2024-11-08 DIAGNOSIS — M359 Systemic involvement of connective tissue, unspecified: Secondary | ICD-10-CM

## 2024-11-08 DIAGNOSIS — K7460 Unspecified cirrhosis of liver: Secondary | ICD-10-CM

## 2024-11-08 DIAGNOSIS — E041 Nontoxic single thyroid nodule: Secondary | ICD-10-CM

## 2024-11-08 LAB — PULMONARY FUNCTION TEST
DL/VA % pred: 120 %
DL/VA: 4.95 ml/min/mmHg/L
DLCO cor % pred: 72 %
DLCO cor: 13.42 ml/min/mmHg
DLCO unc % pred: 72 %
DLCO unc: 13.42 ml/min/mmHg
FEF 25-75 Pre: 0.92 L/s
FEF2575-%Pred-Pre: 59 %
FEV1-%Pred-Pre: 61 %
FEV1-Pre: 1.21 L
FEV1FVC-%Pred-Pre: 102 %
FEV6-%Pred-Pre: 62 %
FEV6-Pre: 1.58 L
FEV6FVC-%Pred-Pre: 105 %
FVC-%Pred-Pre: 59 %
FVC-Pre: 1.58 L
Pre FEV1/FVC ratio: 77 %
Pre FEV6/FVC Ratio: 100 %

## 2024-11-08 MED ORDER — IPRATROPIUM BROMIDE 0.03 % NA SOLN: 2.0000 | Freq: Two times a day (BID) | NASAL | 5 refills | Status: AC

## 2024-11-08 MED ORDER — AZELASTINE HCL 0.1 % NA SOLN: 1.0000 | Freq: Two times a day (BID) | NASAL | 5 refills | Status: AC | PRN

## 2024-11-08 NOTE — Patient Instructions (Addendum)
°  VISIT SUMMARY: Today, you had a follow-up appointment to review your interstitial lung disease due to scleroderma. We discussed your current symptoms, medications, and recent test results. Overall, your condition remains stable, and we have outlined a plan to continue monitoring and managing your health.  YOUR PLAN: -INTERSTITIAL LUNG DISEASE DUE TO CONNECTIVE TISSUE DISEASE (SCLERODERMA): Interstitial lung disease (ILD) is a condition where the lung tissue becomes scarred and stiff, making it difficult to breathe. Your ILD is due to scleroderma, a connective tissue disease. Your recent lung function tests show stable results, and your symptoms are well-managed. You should continue taking Plaquenil  400 mg daily and prednisone  5 mg daily. We will have a follow-up appointment in 6 months with spirometry and DLCO tests to monitor your lung function. If your symptoms worsen or your lung function decreases, we may consider a CT scan.  -CHRONIC ALLERGIC RHINITIS: Chronic allergic rhinitis is a condition where your nasal passages become inflamed due to allergies, causing symptoms like sinus drainage and cough. You should continue using the ipratropium nasal spray, 2 sprays in each nostril twice daily, and use Astelin  nasal spray as needed.  INSTRUCTIONS: Please continue taking your medications as prescribed and monitor your symptoms. We will see you again in 6 months for a follow-up appointment with spirometry and DLCO tests. If you notice any worsening of your symptoms or changes in your lung function, please contact us  immediately.   Follow-up 6 month with Ramawarmy-  30 min PFT prior

## 2024-11-08 NOTE — Patient Instructions (Signed)
 Spirometry and diffusion capacity performed today.

## 2024-11-08 NOTE — Progress Notes (Signed)
 Spirometry and diffusion capacity performed today.

## 2024-11-08 NOTE — Progress Notes (Signed)
 @Patient  ID: [name], female    DOB: 06-Apr-1946, 78 y.o.   MRN: [id number]  Chief Complaint  Patient presents with   Interstitial Lung Disease    PFT    Referring provider: Sheryle Carwin, MD  HPI: 78 year old female, former smoker. PMH significant CHF, HTN, pulmonary HTN, raynaud's disease, ILD due to connective tissue disease, liver cirrhosis, GERD, rheumatoid arthritis, breast cancer, seasonal allergies, systemic sclerosis.   Previous [facility] pulmonary encounter:  OV 06/08/2024  Subjective:  Patient ID: [name], female , DOB: 1946-05-28 , age 36 y.o. , MRN: [id number] , ADDRESS: [address] [place] [id number] PCP Sheryle Carwin, MD Patient Care Team: Sheryle Carwin, MD as PCP - General (Internal Medicine) Pietro Redell RAMAN, MD as PCP - Cardiology (Cardiology) Brien Belvie BRAVO, MD as Attending Physician (Pulmonary Disease) Vanderbilt Ned, MD as Consulting Physician (General Surgery) Odean Potts, MD as Consulting Physician (Hematology and Oncology) Dewey Rush, MD as Consulting Physician (Radiation Oncology)  This Provider for this visit: Treatment Team:  Attending Provider: Geronimo Amel, MD    ILD secondary to connective tissue disease  [rheumatologist previously - Adelfa CANDIE Call, M.D., M.H.S. at [facility] but now Dr Blair at  [facility])  - per records - ILD since 2009  - systemic sclerosis/RA overlap per [facility] notes  = Telangiectasias, Raynaud's, esophageal dysmotility - ? calcinosis on right heel. Per [facility] notes  - Serology   - 2012 at [facility]: (rheumatoid factor 90, ANA 1: 640, ANA second titer 1: 2000 560, positive SCL 70 August 2012).   -  2021 at [facility]: RF + (low titer), ANA + (low titer), SCl 70 +  (in 2021 Jan at [facility])  -High-resolution CT chest January 2021 with UIP features [stable since 2013] -> stable oct 2023  - ILD: Dr Brien -> Dr Alaine -> DR Geronimo at ILD center [place]     Rx History - all [facility] initiated 12/2011 d/c due to lack of control  of arthrits . MTX initiated 04/2012 -discontinued due to concerns since patient has underlying lung disease Plaquenil  and prednisone  as of July 2023 and feb 2024   D- CHF -   RHC  -  - She had a right heart cath in April 2015 which showed the following: Hemodynamic Findings:   PA: 43/18 (mean 31)   PCWP: 25   Fick Cardiac Index: 3.45 L/min/m2    -Grade 1 diastolic dysfunction on echocardiogram January 2021   06/08/2024 -   Chief Complaint  Patient presents with   Follow-up    Pt wants to go over chest CT results.      HPI [name] 78 y.o. -presents for follow-up.  She was seen in April 2025 by nurse practitioner.  Here for follow-up.  She in February had pulmonary function test that showed continued stability.  Today she is here after CT scan.  I presume visualize a CT scan she has stable ILD for the last 2 years now.  From a respiratory standpoint she continues to be stable.  Her exercise hypoxemia test is also stable.  She is more concerned about onychomycosis of the right index finger.  This started a few weeks ago.  She is now on cephalexin  but is giving her intense nausea and diarrhea.  I advised her to talk to primary care about it.  The Raynaud's is also acting up.  The CT scan reported new thyroid  nodule which she is not aware of from the past.  Also reported cirrhosis.  We came up with a monitoring plan for this and an approach for this.   Patient Instructions  Interstitial lung disease due to connective tissue disease (HCC) Scleroderma (HCC)   - pulmonary fibrosis stable clinicallly on cT and symptoms x 2years  Plan - continue monitoring approach and consider immunomodulatory medications or antifibrotic if there is worsening - -do spirometry and dlco in 4-6 monhts - continue primary treatment of scleroderma through rheum   #Onychomycosis of the right index finger  -Noted cephalexin  given nausea and diarrhea  Plan - According to primary care doctor -please  contact  #Thyroid  nodule reported on CT scan of the chest June 2025  Plan - Please contact primary care doctor  #Cirrhosis reported on CT scan of the chest June 2025  Plan - Get right upper quadrant ultrasound next few to several weeks and then discussed with primary care physician  Followup 15 min visit in 4-6 months after PFT  =- symptoms score and walk test at followu   11/08/2024- interim hx  Discussed the use of AI scribe software for clinical note transcription with the patient, who gave verbal consent to proceed.  History of Present Illness [name] is a 78 year old female with interstitial lung disease due to scleroderma who presents for a follow-up visit.  She has a history of interstitial lung disease first noted in 2009, associated with systemic sclerosis and rheumatoid arthritis overlap. A CT scan in 2021 showed usual interstitial pneumonia features that have been stable since 2013. Her current medications include Plaquenil  400 mg daily and prednisone  5 mg daily. Recent spirometry and DLCO tests showed a forced expiratory volume of 61%, which matches previous results from February of the same year. Her diffusion capacity was 13.8 previously and is now 18.41, with a percentage of 72%.  She experiences minimal shortness of breath during rest and mild during simple tasks. She uses a scooter for grocery shopping occasionally due to past knee replacements, which helps manage fatigue. She rates her fatigue as 1 out of 5.  She has some cough due to sinus drainage, particularly during this time of year, and uses a nasal spray, Atrovent , twice daily. She also uses Astelin  occasionally for sinus issues.  She experiences occasional diarrhea but not consistently. Her anxiety and mood are rated as 3.5 out of 5, with situational factors related to living with an elderly family member impacting her mood.    SYMPTOM SCALE - ILD 12/26/2019   08/08/2020 198# 06/19/2022 197#  08/20/2022   09/28/2022  12/31/2022  06/08/2024  11/08/2024   O2 use ra ra ra   RA ra ra RA  Shortness of Breath 0 -> 5 scale with 5 being worst (score 6 If unable to do)            At rest 0 0 0 2 0 0 0 0  Simple tasks - showers, clothes change, eating, shaving 3 2 1 3 2 1 3 1   Household (dishes, doing bed, laundry) 3 3 1 2 2 2 3  0.5  Shopping  2 2 1 3 2 3  0 1  Walking level at own pace 3 2 1.5 2 0 2 0 0  Walking up Stairs 2 3 4 4 4 4 3 3   Total (30-36) Dyspnea Score 14 12 8.5 16 10 12 9  5.5  How bad is your cough? 3 3 At times   2 0 2 2- PND  How bad is your fatigue  3 3 1 2 2 2 4 1   How bad is nausea 3 2 0 0 0 0 0 0  How bad is vomiting?   0 0 0 0 0 0 0 0  How bad is diarrhea? 3 0 1 0 0 0 2 0  How bad is anxiety? 3 3 2 2  4- recent breast cancer dx 2 4 3.5  How bad is depression 3 3 2 1 3 2 4  3.5     SIT STAND TEST - goal 15 times   06/08/2024  11/08/2024   O2 used ra RA  PRobe - finter or forehead foreead Forehead   Number sit and stand completed - goal 15 15 15   Time taken to complete 1 min and 44 sec slow   Resting Pulse Ox/HR/Dyspnea  100% and 79/min and dyspnea of 0/10  100% RA and HR 85  Peak measures 95 % and 87/min and dyspnea of 1/10 100% RA and HR 94  Final Pulse Ox/HR 97% and 88/min and dyspnea of 1/10 99% RA and HR 93  Desaturated </= 88% [name] [name]  Desaturated <= 3% points [name] [name]  Got Tachycardic >/= 90/min [name] yes  Miscellaneous comments Very slow Mild dyspnea, slow pace     Allergies  Allergen Reactions   Codeine Other (See Comments)    Neurological problems - hallucinations, spacey    Immunization History  Administered Date(s) Administered   Fluad Trivalent(High Dose 65+) 08/08/2024   INFLUENZA, HIGH DOSE SEASONAL PF 08/28/2017   Influenza Split 08/01/2011, 09/02/2012, 08/30/2013, 08/29/2016   Influenza,inj,Quad PF,6+ Mos 09/03/2014   Influenza-Unspecified 08/14/2014, 07/20/2019, 08/15/2019   Moderna Sars-Covid-2 Vaccination 12/21/2018, 01/24/2020    Pneumococcal Conjugate-13 09/19/2014, 11/28/2014   Pneumococcal Polysaccharide-23 11/30/2008, 08/30/2009   Zoster, Live 11/30/2008    Past Medical History:  Diagnosis Date   Cancer (HCC)    Left breast cancer   Diastolic congestive heart failure (HCC)    02/05/15  states [name] fluid in extremities, breathing same as has been   GERD (gastroesophageal reflux disease)    Hepatic steatosis    History of blood transfusion    Hx of cholecystectomy    Hypertension    Interstitial lung disease (HCC)    Osteoarthritis    Pulmonary fibrosis (HCC)    Raynaud disease    Rheumatoid arthritis(714.0)    Right BBB/left ant fasc block    Scleroderma (HCC)    Seasonal allergies    Shortness of breath dyspnea    states normal for her   Systemic sclerosis (HCC)     Tobacco History: Social History   Tobacco Use  Smoking Status Former   Current packs/day: 0.00   Average packs/day: 1 pack/day for 20.0 years (20.0 ttl pk-yrs)   Types: Cigarettes   Start date: 11/30/1968   Quit date: 11/30/1988   Years since quitting: 35.9  Smokeless Tobacco Never   Counseling given: Not Answered   Outpatient Medications Prior to Visit  Medication Sig Dispense Refill   acetaminophen  (TYLENOL ) 500 MG tablet Take 1,000 mg by mouth every 6 (six) hours as needed for moderate pain.     albuterol  (VENTOLIN  HFA) 108 (90 Base) MCG/ACT inhaler Inhale 2 puffs into the lungs every 6 (six) hours as needed for wheezing. 8 g 1   amLODipine  (NORVASC ) 5 MG tablet Take 5 mg by mouth in the morning.     anastrozole  (ARIMIDEX ) 1 MG tablet Take 1 tablet (1 mg total) by mouth daily. 90 tablet 3   aspirin   EC 81 MG tablet Take 1 tablet (81 mg total) by mouth daily with breakfast. 30 tablet 11   azelastine  (ASTELIN ) 0.1 % nasal spray Place 1 spray into both nostrils 2 (two) times daily as needed for rhinitis. Use in each nostril as directed     furosemide  (LASIX ) 40 MG tablet TAKE 2 TABLETS BY MOUTH  EVERY DAY 180 tablet 3    hydroxychloroquine  (PLAQUENIL ) 200 MG tablet Take 400 mg by mouth in the morning.     nortriptyline (PAMELOR) 10 MG capsule Take 30 mg by mouth at bedtime.     pantoprazole  (PROTONIX ) 40 MG tablet Take 40 mg by mouth 2 (two) times daily.     predniSONE  (DELTASONE ) 5 MG tablet Take 5 mg by mouth in the morning.     zolpidem  (AMBIEN ) 10 MG tablet Take 10 mg by mouth at bedtime.     [name] facility-administered medications prior to visit.   Review of Systems  Review of Systems  Constitutional: Negative.   Respiratory: Negative.    Cardiovascular: Negative.    Physical Exam  BP 126/64   Pulse 93   Temp 97.6 F (36.4 C)   Ht 5' 3 (1.6 m)   Wt 175 lb (79.4 kg)   SpO2 99% Comment: ra, resting, after sit stand  BMI 31.00 kg/m  Physical Exam Constitutional:      Appearance: Normal appearance. She is well-developed.  HENT:     Head: Normocephalic and atraumatic.     Mouth/Throat:     Mouth: Mucous membranes are moist.     Pharynx: Oropharynx is clear.  Eyes:     Pupils: Pupils are equal, round, and reactive to light.  Cardiovascular:     Rate and Rhythm: Normal rate and regular rhythm.     Heart sounds: Normal heart sounds. [name] murmur heard. Pulmonary:     Effort: Pulmonary effort is normal. [name] respiratory distress.     Breath sounds: Rales present. [name] wheezing or rhonchi.  Musculoskeletal:        General: Normal range of motion.     Cervical back: Normal range of motion and neck supple.  Skin:    General: Skin is warm and dry.     Findings: [name] erythema or rash.  Neurological:     General: [name] focal deficit present.     Mental Status: She is alert and oriented to person, place, and time. Mental status is at baseline.  Psychiatric:        Mood and Affect: Mood normal.        Behavior: Behavior normal.        Thought Content: Thought content normal.        Judgment: Judgment normal.      Lab Results:  CBC    Component Value Date/Time   WBC 14.6 (H) 07/22/2024 0859    RBC 4.41 07/22/2024 0859   HGB 14.5 07/22/2024 0859   HGB 16.0 (H) 01/12/2023 1358   HCT 42.9 07/22/2024 0859   PLT 362 07/22/2024 0859   PLT 302 01/12/2023 1358   MCV 97.3 07/22/2024 0859   MCH 32.9 07/22/2024 0859   MCHC 33.8 07/22/2024 0859   RDW 13.1 07/22/2024 0859   LYMPHSABS 1.6 07/10/2024 1335   MONOABS 1.3 (H) 07/10/2024 1335   EOSABS 0.0 07/10/2024 1335   BASOSABS 0.1 07/10/2024 1335    BMET    Component Value Date/Time   NA 141 08/21/2024 1142   NA 140 05/18/2024 0957   K 3.3 (  L) 08/21/2024 1142   CL 97 (L) 08/21/2024 1142   CO2 34 (H) 08/21/2024 1142   GLUCOSE 155 (H) 08/21/2024 1142   BUN 13 08/21/2024 1142   BUN 13 05/18/2024 0957   CREATININE 0.69 08/21/2024 1142   CALCIUM  9.5 08/21/2024 1142   GFRNONAA >60 07/22/2024 0859   GFRNONAA >60 09/30/2022 1203   GFRNONAA 82 08/14/2014 1604   GFRAA 86 09/22/2019 1335   GFRAA >89 08/14/2014 1604    BNP    Component Value Date/Time   BNP 51.7 08/14/2014 1604    ProBNP [name] results found for: PROBNP  Imaging: US  FNA BX THYROID  1ST LESION AFIRMA Result Date: 10/11/2024 INDICATION: Indeterminate thyroid  nodule Thyroid  Nodule. Repeat prior inconclusive biopsy 09/13/2024. EXAM: ULTRASOUND GUIDED FINE NEEDLE ASPIRATION OF INDETERMINATE THYROID  NODULE COMPARISON:  U/S thyroid  on 08/03/2024. MEDICATIONS: 4 cc of 1% lidocaine . COMPLICATIONS: None immediate. TECHNIQUE: Informed written consent was obtained from the patient after a discussion of the risks, benefits and alternatives to treatment. Questions regarding the procedure were encouraged and answered. A timeout was performed prior to the initiation of the procedure. Pre-procedural ultrasound scanning demonstrated unchanged size and appearance of the indeterminate nodule within the right thyroid  lobe. The procedure was planned. The neck was prepped in the usual sterile fashion, and a sterile drape was applied covering the operative field. A timeout was performed prior  to the initiation of the procedure. Local anesthesia was provided with 1% lidocaine . Under direct ultrasound guidance, 5 FNA biopsies were performed of nodule 3 in the right inferior lobe of the thyroid , with a 25 gauge needle. Multiple ultrasound images were saved for procedural documentation purposes. The samples were prepared and submitted to pathology. Limited post procedural scanning was negative for hematoma or additional complication. Dressings were placed. The patient tolerated the above procedures procedure well without immediate postprocedural complication. FINDINGS: Nodule reference number based on prior diagnostic ultrasound: 3 Maximum size: 2.9 cm Location: Right; Inferior ACR TI-RADS risk category: TR3 (3 points) Reason for biopsy: meets ACR TI-RADS criteria Ultrasound imaging confirms appropriate placement of the needles within the thyroid  nodule. IMPRESSION: Successful repeat ultrasound guided FNA biopsy of a 2.9 cm RIGHT inferior TR-3 thyroid  nodule Procedure performed by Carlin Griffon, PA-C RECOMMENDATIONS: Should this repeat biopsy return inadequate in sampling/inconclusive, follow-up with US  Thyroid  in 1 year is recommended. Thom Hall, MD [facility] Electronically Signed   By: Thom Hall M.D.   On: 10/11/2024 17:24     Assessment & Plan:   [name] problem-specific Assessment & Plan notes found for this encounter.   1. Pulmonary fibrosis (HCC) (Primary)  2. Interstitial lung disease due to connective tissue disease (HCC)   Assessment and Plan Assessment & Plan Interstitial lung disease due to connective tissue disease (scleroderma) Interstitial lung disease (ILD) secondary to scleroderma, first noted in 2009. CT scan in 2025 showed stable fibrotic changes. Lung function tests show stable results with [name] loss of lung function. Diffusion capacity is stable with a minimal defect. Symptoms are well-managed with [name] significant  changes in dyspnea. Current management is conservative, focusing on monitoring symptoms and lung function. [name] need for repeat CT scan at this time due to stable condition and radiation exposure concerns. - Continue Plaquenil  400 mg daily. - Continue prednisone  5 mg daily. - Scheduled follow-up in 6 months with spirometry and DLCO. - Monitor symptoms and lung function; will consider CT scan if symptoms worsen or lung function decreases.  Chronic allergic rhinitis Reports sinus  drainage and occasional cough, particularly in the winter. Uses ipratropium nasal spray twice daily as prescribed. Astelin  is used occasionally for eustachian tube drainage during colds. - Refilled ipratropium nasal spray, 2 sprays in each nostril twice daily. - Refilled Astelin  nasal spray as needed for eustachian tube drainage.     Almarie LELON Ferrari, NP 11/08/2024

## 2024-11-20 NOTE — Progress Notes (Signed)
 "    HPI: FU diastolic CHF; also with history of HTN, CREST/scleroderma and ILD. PFTs 4/15 with FEV1 1.54 and DLCO 68% predicted. LHC (03/15/14): RA 7, RV 43/7/9, PA 43/18 (mean 31), PCWP 25, CO 6.9, CI 3.45, no CAD. Placed on lasix . ABIs May 2016 normal. Chest CT January 2022 showed pulmonary fibrosis, bronchiectasis consistent with UIP.  There was note of coronary artery disease and hepatic steatosis suggestive of cirrhosis.  Nuclear study March 2022 showed ejection fraction 82% and normal perfusion.  Coronary CTA March 2024 showed minimal nonobstructive coronary disease, calcium  score 419 which is 85th percentile.  Echocardiogram September 2025 showed normal LV function, moderate left ventricular hypertrophy, grade 1 diastolic dysfunction.  Since last seen, patient has over the past 2 to 3 weeks chest pain in the left chest area that increases with moving her left arm and coughing.  She does not have exertional chest pain.  Dyspnea with more vigorous activities.  No orthopnea, PND or syncope.  Current Outpatient Medications  Medication Sig Dispense Refill   acetaminophen  (TYLENOL ) 500 MG tablet Take 1,000 mg by mouth every 6 (six) hours as needed for moderate pain.     albuterol  (VENTOLIN  HFA) 108 (90 Base) MCG/ACT inhaler Inhale 2 puffs into the lungs every 6 (six) hours as needed for wheezing. 8 g 1   amLODipine  (NORVASC ) 5 MG tablet Take 5 mg by mouth in the morning.     anastrozole  (ARIMIDEX ) 1 MG tablet Take 1 tablet (1 mg total) by mouth daily. 90 tablet 3   aspirin  EC 81 MG tablet Take 1 tablet (81 mg total) by mouth daily with breakfast. 30 tablet 11   azelastine  (ASTELIN ) 0.1 % nasal spray Place 1 spray into both nostrils 2 (two) times daily as needed for rhinitis. Use in each nostril as directed 30 mL 5   furosemide  (LASIX ) 40 MG tablet TAKE 2 TABLETS BY MOUTH  EVERY DAY 180 tablet 3   hydroxychloroquine  (PLAQUENIL ) 200 MG tablet Take 400 mg by mouth in the morning.     ipratropium  (ATROVENT ) 0.03 % nasal spray Place 2 sprays into both nostrils every 12 (twelve) hours. 30 mL 5   nortriptyline (PAMELOR) 10 MG capsule Take 30 mg by mouth at bedtime.     pantoprazole  (PROTONIX ) 40 MG tablet Take 40 mg by mouth 2 (two) times daily.     predniSONE  (DELTASONE ) 5 MG tablet Take 5 mg by mouth in the morning.     zolpidem  (AMBIEN ) 10 MG tablet Take 10 mg by mouth at bedtime.     No current facility-administered medications for this visit.     Past Medical History:  Diagnosis Date   Cancer [facility])    Left breast cancer   Diastolic congestive heart failure (HCC)    02/05/15  states no fluid in extremities, breathing same as has been   GERD (gastroesophageal reflux disease)    Hepatic steatosis    History of blood transfusion    Hx of cholecystectomy    Hypertension    Interstitial lung disease (HCC)    Osteoarthritis    Pulmonary fibrosis (HCC)    Raynaud disease    Rheumatoid arthritis(714.0)    Right BBB/left ant fasc block    Scleroderma (HCC)    Seasonal allergies    Shortness of breath dyspnea    states normal for her   Systemic sclerosis [facility])     Past Surgical History:  Procedure Laterality Date   ABDOMINAL HYSTERECTOMY  BLADDER SUSPENSION     BREAST BIOPSY  11/03/2022   MM LT RADIOACTIVE SEED LOC MAMMO GUIDE 11/03/2022 [facility] MAMMOGRAPHY   BREAST LUMPECTOMY Left 10/2022   BREAST LUMPECTOMY WITH RADIOACTIVE SEED LOCALIZATION Left 11/04/2022   Procedure: LEFT BREAST LUMPECTOMY WITH RADIOACTIVE SEED LOCALIZATION;  Surgeon: Vanderbilt Ned, MD;  Location: [facility] OR;  Service: General;  Laterality: Left;   CARPAL TUNNEL RELEASE Right 2007   EYE SURGERY Bilateral 2023   cataract w/ IOL   LAPAROSCOPIC CHOLECYSTECTOMY     LEFT HEART CATHETERIZATION WITH CORONARY/GRAFT ANGIOGRAM  03/15/2014   Procedure: LEFT HEART CATHETERIZATION WITH EL BILE;  Surgeon: Lonni JONETTA Cash, MD;  Location: [facility] CATH LAB;  Service: Cardiovascular;;   REVERSE  SHOULDER ARTHROPLASTY Right 05/06/2023   Procedure: REVERSE SHOULDER ARTHROPLASTY;  Surgeon: Melita Drivers, MD;  Location: [facility] ORS;  Service: Orthopedics;  Laterality: Right;    TONSILLECTOMY     TOTAL KNEE ARTHROPLASTY Left 02/11/2015   Procedure: LEFT TOTAL KNEE ARTHROPLASTY, RIGHT KNEE CORTISONE INJECTION;  Surgeon: Dempsey Moan, MD;  Location: [facility] ORS;  Service: Orthopedics;  Laterality: Left;   TOTAL KNEE ARTHROPLASTY Right 10/05/2016   Procedure: RIGHT TOTAL KNEE ARTHROPLASTY;  Surgeon: Dempsey Moan, MD;  Location: [facility] ORS;  Service: Orthopedics;  Laterality: Right;   VESICOVAGINAL FISTULA CLOSURE W/ TAH  11/30/1985    Social History   Socioeconomic History   Marital status: Married    Spouse name: Fairy   Number of children: 3   Years of education: Not on file   Highest education level: Some college, no degree  Occupational History   Occupation: Academic Librarian: [employer]  Tobacco Use   Smoking status: Former    Current packs/day: 0.00    Average packs/day: 1 pack/day for 20.0 years (20.0 ttl pk-yrs)    Types: Cigarettes    Start date: 11/30/1968    Quit date: 11/30/1988    Years since quitting: 36.0   Smokeless tobacco: Never  Vaping Use   Vaping status: Never Used  Substance and Sexual Activity   Alcohol use: No   Drug use: No   Sexual activity: Not Currently  Other Topics Concern   Not on file  Social History Narrative   Not on file   Social Drivers of Health   Tobacco Use: Medium Risk (11/29/2024)   Patient History    Smoking Tobacco Use: Former    Smokeless Tobacco Use: Never    Passive Exposure: Not on Actuary Strain: Not on file  Food Insecurity: No Food Insecurity (07/21/2024)   Epic    Worried About Radiation Protection Practitioner of Food in the Last Year: Never true    Ran Out of Food in the Last Year: Never true  Transportation Needs: No Transportation Needs (07/21/2024)   Epic    Lack of Transportation (Medical): No     Lack of Transportation (Non-Medical): No  Physical Activity: Not on file  Stress: Not on file  Social Connections: Moderately Isolated (07/21/2024)   Social Connection and Isolation Panel    Frequency of Communication with Friends and Family: More than three times a week    Frequency of Social Gatherings with Friends and Family: Twice a week    Attends Religious Services: Never    Database Administrator or Organizations: No    Attends Banker Meetings: Never    Marital Status: Married  Catering Manager Violence: Not At Risk (07/21/2024)   Epic  Fear of Current or Ex-Partner: No    Emotionally Abused: No    Physically Abused: No    Sexually Abused: No  Depression (PHQ2-9): Low Risk (08/02/2024)   Depression (PHQ2-9)    PHQ-2 Score: 0  Alcohol Screen: Not on file  Housing: Low Risk (07/21/2024)   Epic    Unable to Pay for Housing in the Last Year: No    Number of Times Moved in the Last Year: 0    Homeless in the Last Year: No  Utilities: Not At Risk (07/21/2024)   Epic    Threatened with loss of utilities: No  Health Literacy: Not on file    Family History  Problem Relation Age of Onset   Stroke Mother    Heart disease Mother        CABG at age 49   Heart attack Father        Died of MI at age 35   Emphysema Father    Dementia Brother    Osteoarthritis Other    Celiac disease Other     ROS: no fevers or chills, productive cough, hemoptysis, dysphasia, odynophagia, melena, hematochezia, dysuria, hematuria, rash, seizure activity, orthopnea, PND, pedal edema, claudication. Remaining systems are negative.  Physical Exam: Well-developed well-nourished in no acute distress.  Skin is warm and dry.  HEENT is normal.  Neck is supple.  Chest diffuse dry crackles Cardiovascular exam is regular rate and rhythm.  Abdominal exam nontender or distended. No masses palpated. Extremities show no edema. neuro grossly intact  ECG-November 28, 2024-normal sinus rhythm  with nonspecific ST changes.  Personally reviewed  A/P  1 chronic diastolic congestive heart failure-she is euvolemic on examination.  Will continue Lasix  at present dose.  Check potassium and renal function.  2 coronary calcification-minimal disease on previous CTA.  Resume Crestor .  3 hypertension-patient's blood pressure is controlled.  Continue present medical regimen.  4 palpitations-no recent symptoms.  5 hyperlipidemia-resume Crestor .  She has been diagnosed with NASH.  Check lipids and liver in 8 weeks.  6 interstitial lung disease-managed by pulmonary.  7 recent episode of chest pain that increases with arm movement and cough-likely musculoskeletal.  Recent ECG without ST changes.  Redell Shallow, MD    "

## 2024-11-28 ENCOUNTER — Ambulatory Visit (HOSPITAL_BASED_OUTPATIENT_CLINIC_OR_DEPARTMENT_OTHER)

## 2024-11-28 ENCOUNTER — Ambulatory Visit: Payer: Self-pay | Admitting: Internal Medicine

## 2024-11-28 NOTE — Telephone Encounter (Signed)
 [email or web address] Pulmonary Triage - Initial Assessment Questions Chief Complaint (e.g., cough, sob, wheezing, fever, chills, sweat or additional symptoms) *Go to specific symptom protocol after initial questions. CP worse with cough, sneeze  How long have symptoms been present? yesterday  FYI Only or Action Required?: Action required by provider: request for appointment.  Called Nurse Triage reporting Chest Pain.  Symptoms began yesterday.  Interventions attempted: Nothing.  Symptoms are: gradually worsening.  Triage Disposition: Go to ED Now (or PCP Triage)  Patient/caregiver understands and will follow disposition?: No, wishes to speak with PCP  Copied from CRM #[id number]. Topic: Clinical - Red Word Triage >> Nov 28, 2024 11:09 AM Russell PARAS wrote: Red Word that prompted transfer to Nurse Triage:   Symptoms started on 12/29 Severe pain in left side of chest Worse today When coughs or sneezes, pain worsens  Pt of Ramaswamy([place]) Reason for Disposition  Taking a deep breath makes pain worse  Answer Assessment - Initial Assessment Questions 1. LOCATION: Where does it hurt?       L CP 2. RADIATION: Does the pain go anywhere else? (e.g., into neck, jaw, arms, back)     back 3. ONSET: When did the chest pain begin? (Minutes, hours or days)      yesterday 4. PATTERN: Does the pain come and go, or has it been constant since it started?  Does it get worse with exertion?      Worse with movement-cough/sneeze 6. SEVERITY: How bad is the pain?  (e.g., Scale 1-10; mild, moderate, or severe)     8 9. CAUSE: What do you think is causing the chest pain?     Denies injuring ribs  Pt was advised to be seen in the ED, pt refuses states she does not want to wait for hours, requests pulm appt none available. Pt had not reached out to PCP at time of call.  Protocols used: Chest Pain-A-AH

## 2024-11-28 NOTE — Progress Notes (Deleted)
 "  @Patient  ID: [name] No, female    DOB: 07/26/1946, 78 y.o.   MRN: [id number]  No chief complaint on file.   Referring provider: Sheryle Carwin, MD  HPI: Discussed the use of AI scribe software for clinical note transcription with the patient, who gave verbal consent to proceed.  History of Present Illness  Last OV 11/08/2024: [name] is a 78 year old female with interstitial lung disease due to scleroderma who presents for a follow-up visit.   She has a history of interstitial lung disease first noted in 2009, associated with systemic sclerosis and rheumatoid arthritis overlap. A CT scan in 2021 showed usual interstitial pneumonia features that have been stable since 2013. Her current medications include Plaquenil  400 mg daily and prednisone  5 mg daily. Recent spirometry and DLCO tests showed a forced expiratory volume of 61%, which matches previous results from February of the same year. Her diffusion capacity was 13.8 previously and is now 18.41, with a percentage of 72%.   She experiences minimal shortness of breath during rest and mild during simple tasks. She uses a scooter for grocery shopping occasionally due to past knee replacements, which helps manage fatigue. She rates her fatigue as 1 out of 5.   She has some cough due to sinus drainage, particularly during this time of year, and uses a nasal spray, Atrovent , twice daily. She also uses Astelin  occasionally for sinus issues.   She experiences occasional diarrhea but not consistently. Her anxiety and mood are rated as 3.5 out of 5, with situational factors related to living with an elderly family member impacting her mood.    TEST/EVENTS :  ILD secondary to connective tissue disease  [rheumatologist previously - Adelfa CANDIE Call, M.D., M.H.S. at [facility] but now Dr Blair at  [facility])             - per records - ILD since 2009             - systemic sclerosis/RA overlap per [facility] notes            = Telangiectasias,  Raynaud's, esophageal dysmotility - ? calcinosis on right heel. Per [facility] notes             - Serology                         - 2012 at [facility]: (rheumatoid factor 90, ANA 1: 640, ANA second titer 1: 2000 560, positive SCL 70 August 2012).                         -  2021 at [facility]: RF + (low titer), ANA + (low titer), SCl 70 +  (in 2021 Jan at [facility])             -High-resolution CT chest January 2021 with UIP features [stable since 2013] -> stable oct 2023             - ILD: Dr Brien -> Dr Alaine -> DR Geronimo at ILD center [place]                  Rx History - all [facility] Arava initiated 12/2011 d/c due to lack of control of arthrits . MTX initiated 04/2012 -discontinued due to concerns since patient has underlying lung disease Plaquenil  and prednisone  as of July 2023 and feb 2024     D- CHF -   [facility]  -  -  She had a right heart cath in April 2015 which showed the following: Hemodynamic Findings:   PA: 43/18 (mean 31)   PCWP: 25   Fick Cardiac Index: 3.45 L/min/m2     -Grade 1 diastolic dysfunction on echocardiogram January 2021   Allergies[1]  Immunization History  Administered Date(s) Administered   Fluad Trivalent(High Dose 65+) 08/08/2024   INFLUENZA, HIGH DOSE SEASONAL PF 08/28/2017   Influenza Split 08/01/2011, 09/02/2012, 08/30/2013, 08/29/2016   Influenza,inj,Quad PF,6+ Mos 09/03/2014   Influenza-Unspecified 08/14/2014, 07/20/2019, 08/15/2019   Moderna Sars-Covid-2 Vaccination 12/21/2018, 01/24/2020   Pneumococcal Conjugate-13 09/19/2014, 11/28/2014   Pneumococcal Polysaccharide-23 11/30/2008, 08/30/2009   Zoster, Live 11/30/2008    Past Medical History:  Diagnosis Date   Cancer (HCC)    Left breast cancer   Diastolic congestive heart failure (HCC)    02/05/15  states no fluid in extremities, breathing same as has been   GERD (gastroesophageal reflux disease)    Hepatic steatosis    History of blood transfusion    Hx of cholecystectomy    Hypertension    Interstitial  lung disease (HCC)    Osteoarthritis    Pulmonary fibrosis (HCC)    Raynaud disease    Rheumatoid arthritis(714.0)    Right BBB/left ant fasc block    Scleroderma (HCC)    Seasonal allergies    Shortness of breath dyspnea    states normal for her   Systemic sclerosis (HCC)     Tobacco History: Tobacco Use History[2] Counseling given: Not Answered   Outpatient Medications Prior to Visit  Medication Sig Dispense Refill   acetaminophen  (TYLENOL ) 500 MG tablet Take 1,000 mg by mouth every 6 (six) hours as needed for moderate pain.     albuterol  (VENTOLIN  HFA) 108 (90 Base) MCG/ACT inhaler Inhale 2 puffs into the lungs every 6 (six) hours as needed for wheezing. 8 g 1   amLODipine  (NORVASC ) 5 MG tablet Take 5 mg by mouth in the morning.     anastrozole  (ARIMIDEX ) 1 MG tablet Take 1 tablet (1 mg total) by mouth daily. 90 tablet 3   aspirin  EC 81 MG tablet Take 1 tablet (81 mg total) by mouth daily with breakfast. 30 tablet 11   azelastine  (ASTELIN ) 0.1 % nasal spray Place 1 spray into both nostrils 2 (two) times daily as needed for rhinitis. Use in each nostril as directed 30 mL 5   furosemide  (LASIX ) 40 MG tablet TAKE 2 TABLETS BY MOUTH  EVERY DAY 180 tablet 3   hydroxychloroquine  (PLAQUENIL ) 200 MG tablet Take 400 mg by mouth in the morning.     ipratropium (ATROVENT ) 0.03 % nasal spray Place 2 sprays into both nostrils every 12 (twelve) hours. 30 mL 5   nortriptyline (PAMELOR) 10 MG capsule Take 30 mg by mouth at bedtime.     pantoprazole  (PROTONIX ) 40 MG tablet Take 40 mg by mouth 2 (two) times daily.     predniSONE  (DELTASONE ) 5 MG tablet Take 5 mg by mouth in the morning.     zolpidem  (AMBIEN ) 10 MG tablet Take 10 mg by mouth at bedtime.     No facility-administered medications prior to visit.     Review of Systems:   Constitutional:   No  weight loss, night sweats,  Fevers, chills, fatigue, or  lassitude.  HEENT:   No headaches,  Difficulty swallowing,  Tooth/dental  problems, or  Sore throat,                No sneezing,  itching, ear ache, nasal congestion, post nasal drip,   CV:  No chest pain,  Orthopnea, PND, swelling in lower extremities, anasarca, dizziness, palpitations, syncope.   GI  No heartburn, indigestion, abdominal pain, nausea, vomiting, diarrhea, change in bowel habits, loss of appetite, bloody stools.   Resp: No shortness of breath with exertion or at rest.  No excess mucus, no productive cough,  No non-productive cough,  No coughing up of blood.  No change in color of mucus.  No wheezing.  No chest wall deformity  Skin: no rash or lesions.  GU: no dysuria, change in color of urine, no urgency or frequency.  No flank pain, no hematuria   MS:  No joint pain or swelling.  No decreased range of motion.  No back pain.    Physical Exam  There were no vitals taken for this visit.  GEN: A/Ox3; pleasant , NAD, well nourished    HEENT:  [place]/AT,  EACs-clear, TMs-wnl, NOSE-clear, THROAT-clear, no lesions, no postnasal drip or exudate noted.   NECK:  Supple w/ fair ROM; no JVD; normal carotid impulses w/o bruits; no thyromegaly or nodules palpated; no lymphadenopathy.    RESP  Clear  P & A; w/o, wheezes/ rales/ or rhonchi. no accessory muscle use, no dullness to percussion  CARD:  RRR, no m/r/g, no peripheral edema, pulses intact, no cyanosis or clubbing.  GI:   Soft & nt; nml bowel sounds; no organomegaly or masses detected.   Musco: Warm bil, no deformities or joint swelling noted.   Neuro: alert, no focal deficits noted.    Skin: Warm, no lesions or rashes    Lab Results:  CBC    Component Value Date/Time   WBC 14.6 (H) 07/22/2024 0859   RBC 4.41 07/22/2024 0859   HGB 14.5 07/22/2024 0859   HGB 16.0 (H) 01/12/2023 1358   HCT 42.9 07/22/2024 0859   PLT 362 07/22/2024 0859   PLT 302 01/12/2023 1358   MCV 97.3 07/22/2024 0859   MCH 32.9 07/22/2024 0859   MCHC 33.8 07/22/2024 0859   RDW 13.1 07/22/2024 0859   LYMPHSABS  1.6 07/10/2024 1335   MONOABS 1.3 (H) 07/10/2024 1335   EOSABS 0.0 07/10/2024 1335   BASOSABS 0.1 07/10/2024 1335    BMET    Component Value Date/Time   NA 141 08/21/2024 1142   NA 140 05/18/2024 0957   K 3.3 (L) 08/21/2024 1142   CL 97 (L) 08/21/2024 1142   CO2 34 (H) 08/21/2024 1142   GLUCOSE 155 (H) 08/21/2024 1142   BUN 13 08/21/2024 1142   BUN 13 05/18/2024 0957   CREATININE 0.69 08/21/2024 1142   CALCIUM  9.5 08/21/2024 1142   GFRNONAA >60 07/22/2024 0859   GFRNONAA >60 09/30/2022 1203   GFRNONAA 82 08/14/2014 1604   GFRAA 86 09/22/2019 1335   GFRAA >89 08/14/2014 1604    BNP    Component Value Date/Time   BNP 51.7 08/14/2014 1604    ProBNP No results found for: PROBNP  Imaging: No results found.  Administration History     None          Latest Ref Rng & Units 11/08/2024   12:36 PM 01/26/2024    1:26 PM 07/27/2022    4:11 PM 07/23/2020   11:58 AM 02/25/2018    2:59 PM 09/22/2016    3:33 PM 02/07/2015   10:34 AM  PFT Results  FVC-Pre L 1.58  1.63  1.51  1.70  1.75  1.94  1.97  FVC-Predicted Pre % 59  60  55  60  60  68  68   FVC-Post L     1.78  1.92  1.94   FVC-Predicted Post %     61  68  67   Pre FEV1/FVC % % 77  76  76  73  77  74  76   Post FEV1/FCV % %     78  79  80   FEV1-Pre L 1.21  1.24  1.15  1.25  1.35  1.44  1.50   FEV1-Predicted Pre % 61  61  56  59  62  67  68   FEV1-Post L     1.38  1.51  1.56   DLCO uncorrected ml/min/mmHg 13.42  13.80  14.37  16.83  16.44  15.97  15.60   DLCO UNC% % 72  74  77  90  71  74  72   DLCO corrected ml/min/mmHg 13.42  13.80  14.37  16.83   15.87    DLCO COR %Predicted % 72  74  77  90   73    DLVA Predicted % 120  124  122  142  122  114  115   TLC L      3.40  2.92   TLC % Predicted %      71  61   RV % Predicted %      72  50     No results found for: NITRICOXIDE   Assessment & Plan:   Assessment & Plan    No follow-ups on file.  Candis Dandy, PA-C 11/28/2024      [1]   Allergies Allergen Reactions   Codeine Other (See Comments)    Neurological problems - hallucinations, spacey  [2]  Social History Tobacco Use  Smoking Status Former   Current packs/day: 0.00   Average packs/day: 1 pack/day for 20.0 years (20.0 ttl pk-yrs)   Types: Cigarettes   Start date: 11/30/1968   Quit date: 11/30/1988   Years since quitting: 36.0  Smokeless Tobacco Never   "

## 2024-11-28 NOTE — Telephone Encounter (Signed)
 Called and spoke with the pt and states she has L sided chest pain. Pt has ILD. Pt has appt with primary PA today at 12:30pm to be evaulated. Pt was insisting on seeing MR but he is not here. I have scheduled the pt also with Candis, [place] at 3pm. Pt states she will cancel appt if needed. Pt complains of coughing w/ phlegm (colorless), chest pain, and discomfort when taking deep breath. Pt denies having fever.  Nothing further needed.

## 2024-11-29 ENCOUNTER — Encounter: Payer: Self-pay | Admitting: Cardiology

## 2024-11-29 ENCOUNTER — Ambulatory Visit: Admitting: Cardiology

## 2024-11-29 VITALS — BP 130/54 | HR 69 | Ht 62.0 in | Wt 174.0 lb

## 2024-11-29 DIAGNOSIS — E782 Mixed hyperlipidemia: Secondary | ICD-10-CM

## 2024-11-29 DIAGNOSIS — I10 Essential (primary) hypertension: Secondary | ICD-10-CM

## 2024-11-29 DIAGNOSIS — I5032 Chronic diastolic (congestive) heart failure: Secondary | ICD-10-CM

## 2024-11-29 DIAGNOSIS — I2510 Atherosclerotic heart disease of native coronary artery without angina pectoris: Secondary | ICD-10-CM | POA: Diagnosis not present

## 2024-11-29 MED ORDER — ROSUVASTATIN CALCIUM 20 MG PO TABS: 20.0000 mg | ORAL_TABLET | Freq: Every day | ORAL | 3 refills | Status: AC

## 2024-11-29 NOTE — Patient Instructions (Addendum)
 Medication Instructions:  Your physician has recommended you make the following change in your medication:   START: rosuvastatin  (Crestor ) 20 mg by mouth once daily  *If you need a refill on your cardiac medications before your next appointment, please call your pharmacy*  Lab Work: IN 8 WEEKS at any [facility]: Fasting lipid panel, liver function test and BMP  If you have labs (blood work) drawn today and your tests are completely normal, you will receive your results only by: [employer] (if you have MyChart) OR A paper copy in the mail If you have any lab test that is abnormal or we need to change your treatment, we will call you to review the results.  Testing/Procedures: NONE   Follow-Up: At [facility], you and your health needs are our priority.  As part of our continuing mission to provide you with exceptional heart care, our providers are all part of one team.  This team includes your primary Cardiologist (physician) and Advanced Practice Providers or APPs (Physician Assistants and Nurse Practitioners) who all work together to provide you with the care you need, when you need it.  Your next appointment:   1 year(s)  Provider:   Redell Shallow, MD

## 2025-08-02 ENCOUNTER — Ambulatory Visit: Admitting: Hematology and Oncology
